# Patient Record
Sex: Male | Born: 1945 | Race: White | Hispanic: No | Smoking: Former smoker
Health system: Southern US, Community
[De-identification: ages and names within clinical notes are randomized; demographics above are authoritative.]

## PROBLEM LIST (undated history)

## (undated) DIAGNOSIS — G8929 Other chronic pain: Secondary | ICD-10-CM

## (undated) DIAGNOSIS — G959 Disease of spinal cord, unspecified: Secondary | ICD-10-CM

## (undated) DIAGNOSIS — Z87898 Personal history of other specified conditions: Secondary | ICD-10-CM

## (undated) DIAGNOSIS — Z87442 Personal history of urinary calculi: Secondary | ICD-10-CM

## (undated) DIAGNOSIS — N138 Other obstructive and reflux uropathy: Secondary | ICD-10-CM

## (undated) DIAGNOSIS — M21372 Foot drop, left foot: Secondary | ICD-10-CM

## (undated) DIAGNOSIS — K5901 Slow transit constipation: Secondary | ICD-10-CM

## (undated) DIAGNOSIS — M21371 Foot drop, right foot: Secondary | ICD-10-CM

## (undated) DIAGNOSIS — M4316 Spondylolisthesis, lumbar region: Secondary | ICD-10-CM

## (undated) DIAGNOSIS — E559 Vitamin D deficiency, unspecified: Secondary | ICD-10-CM

## (undated) DIAGNOSIS — M7020 Olecranon bursitis, unspecified elbow: Secondary | ICD-10-CM

## (undated) DIAGNOSIS — H8110 Benign paroxysmal vertigo, unspecified ear: Secondary | ICD-10-CM

## (undated) DIAGNOSIS — Z8739 Personal history of other diseases of the musculoskeletal system and connective tissue: Secondary | ICD-10-CM

## (undated) DIAGNOSIS — G5600 Carpal tunnel syndrome, unspecified upper limb: Secondary | ICD-10-CM

## (undated) DIAGNOSIS — M47819 Spondylosis without myelopathy or radiculopathy, site unspecified: Secondary | ICD-10-CM

## (undated) DIAGNOSIS — Z859 Personal history of malignant neoplasm, unspecified: Secondary | ICD-10-CM

## (undated) DIAGNOSIS — R262 Difficulty in walking, not elsewhere classified: Secondary | ICD-10-CM

## (undated) DIAGNOSIS — M545 Other chronic pain: Secondary | ICD-10-CM

## (undated) DIAGNOSIS — E119 Type 2 diabetes mellitus without complications: Secondary | ICD-10-CM

## (undated) DIAGNOSIS — J309 Allergic rhinitis, unspecified: Secondary | ICD-10-CM

## (undated) DIAGNOSIS — R29898 Other symptoms and signs involving the musculoskeletal system: Secondary | ICD-10-CM

## (undated) DIAGNOSIS — Z993 Dependence on wheelchair: Secondary | ICD-10-CM

## (undated) DIAGNOSIS — K219 Gastro-esophageal reflux disease without esophagitis: Secondary | ICD-10-CM

## (undated) DIAGNOSIS — N200 Calculus of kidney: Secondary | ICD-10-CM

## (undated) DIAGNOSIS — Z9181 History of falling: Secondary | ICD-10-CM

## (undated) DIAGNOSIS — N401 Enlarged prostate with lower urinary tract symptoms: Secondary | ICD-10-CM

## (undated) DIAGNOSIS — I10 Essential (primary) hypertension: Secondary | ICD-10-CM

## (undated) DIAGNOSIS — Z8619 Personal history of other infectious and parasitic diseases: Secondary | ICD-10-CM

## (undated) DIAGNOSIS — Z9889 Other specified postprocedural states: Secondary | ICD-10-CM

## (undated) DIAGNOSIS — G1221 Amyotrophic lateral sclerosis: Secondary | ICD-10-CM

## (undated) HISTORY — DX: Other chronic pain: G89.29

## (undated) HISTORY — PX: COLONOSCOPY: SHX174

## (undated) HISTORY — DX: Low back pain, unspecified: M54.50

## (undated) HISTORY — DX: Type 2 diabetes mellitus without complications: E11.9

## (undated) HISTORY — DX: Benign paroxysmal vertigo, unspecified ear: H81.10

## (undated) HISTORY — DX: Low back pain: M54.5

## (undated) HISTORY — DX: Disease of spinal cord, unspecified: G95.9

## (undated) HISTORY — DX: Amyotrophic lateral sclerosis: G12.21

## (undated) HISTORY — DX: Vitamin D deficiency, unspecified: E55.9

## (undated) HISTORY — DX: Slow transit constipation: K59.01

## (undated) HISTORY — DX: Allergic rhinitis, unspecified: J30.9

## (undated) HISTORY — DX: Benign prostatic hyperplasia with lower urinary tract symptoms: N40.1

## (undated) HISTORY — DX: Essential (primary) hypertension: I10

## (undated) HISTORY — DX: Olecranon bursitis, unspecified elbow: M70.20

## (undated) HISTORY — DX: Benign prostatic hyperplasia with lower urinary tract symptoms: N13.8

## (undated) HISTORY — DX: Gastro-esophageal reflux disease without esophagitis: K21.9

---
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
## 1991-11-15 HISTORY — PX: INGUINAL HERNIA REPAIR: SUR1180

## 2010-04-28 ENCOUNTER — Encounter: Admission: RE | Admit: 2010-04-28 | Discharge: 2010-04-28 | Payer: Self-pay | Admitting: Orthopedic Surgery

## 2012-01-17 ENCOUNTER — Emergency Department (HOSPITAL_BASED_OUTPATIENT_CLINIC_OR_DEPARTMENT_OTHER)
Admission: EM | Admit: 2012-01-17 | Discharge: 2012-01-18 | Disposition: A | Discharge status: Home Health | Payer: Medicare Other | Attending: Emergency Medicine | Admitting: Emergency Medicine

## 2012-01-17 ENCOUNTER — Emergency Department (INDEPENDENT_AMBULATORY_CARE_PROVIDER_SITE_OTHER): Payer: Medicare Other

## 2012-01-17 ENCOUNTER — Encounter (HOSPITAL_BASED_OUTPATIENT_CLINIC_OR_DEPARTMENT_OTHER): Payer: Self-pay | Admitting: *Deleted

## 2012-01-17 DIAGNOSIS — M546 Pain in thoracic spine: Secondary | ICD-10-CM | POA: Diagnosis not present

## 2012-01-17 DIAGNOSIS — R079 Chest pain, unspecified: Secondary | ICD-10-CM

## 2012-01-17 DIAGNOSIS — F17200 Nicotine dependence, unspecified, uncomplicated: Secondary | ICD-10-CM | POA: Insufficient documentation

## 2012-01-17 DIAGNOSIS — M549 Dorsalgia, unspecified: Secondary | ICD-10-CM | POA: Diagnosis not present

## 2012-01-17 DIAGNOSIS — W19XXXA Unspecified fall, initial encounter: Secondary | ICD-10-CM

## 2012-01-17 DIAGNOSIS — S20229A Contusion of unspecified back wall of thorax, initial encounter: Secondary | ICD-10-CM | POA: Insufficient documentation

## 2012-01-17 DIAGNOSIS — IMO0002 Reserved for concepts with insufficient information to code with codable children: Secondary | ICD-10-CM | POA: Diagnosis not present

## 2012-01-17 DIAGNOSIS — T07XXXA Unspecified multiple injuries, initial encounter: Secondary | ICD-10-CM

## 2012-01-17 DIAGNOSIS — W1789XA Other fall from one level to another, initial encounter: Secondary | ICD-10-CM | POA: Insufficient documentation

## 2012-01-17 MED ORDER — TETANUS-DIPHTH-ACELL PERTUSSIS 5-2.5-18.5 LF-MCG/0.5 IM SUSP
0.5000 mL | Freq: Once | INTRAMUSCULAR | Status: AC
  Administered 2012-01-17: 0.5 mL via INTRAMUSCULAR
  Filled 2012-01-17: qty 0.5

## 2012-01-17 NOTE — ED Notes (Signed)
Pt c/o fall from 2 ft landing on right hip and lower back back

## 2012-01-17 NOTE — ED Provider Notes (Signed)
History     CSN: [id number]  Arrival date & time 01/17/12  2157   First MD Initiated Contact with Patient 01/17/12 2255      Chief Complaint  Patient presents with  . Fall  . Back Pain    (Consider location/radiation/quality/duration/timing/severity/associated sxs/prior treatment) HPI Comments: 66 year old male with a history of kidney stones presents with the complaint of right back pain that occurred several hours prior to arrival. He states that he was unloading a trailer that was approximately 3-1/2 feet off the ground when he accidentally stepped off the side with one of his feet causing him to fall sideways and landed on his right lower back. This was acute in onset, the pain is persistent but almost completely resolves when he holds still sitting still or lying down. It is worse when he rotates to the side or bends forward. He does note that he had a sharp pain with a pop when he was turned to the side and cough prior to arrival. This pain does not radiate and is not associated with numbness weakness ataxia perineal numbness, urinary incontinence or urinary retention. He has no history of chronic back pain. He has had no medications prior to arrival. He denies headache, head injury, neck pain. He does have an associated abrasion to his right lower extremity and right elbow. His tetanus status is within 8 years  Patient is a 66 y.o. male presenting with fall and back pain. The history is provided by the patient and a relative.  Fall Pertinent negatives include no numbness, no nausea, no vomiting and no headaches.  Back Pain  Pertinent negatives include no numbness, no headaches and no weakness.    Past Medical History  Diagnosis Date  . Kidney calculi     Past Surgical History  Procedure Date  . Lithotripsy   . Hernia repair   . Tonsillectomy     History reviewed. No pertinent family history.  History  Substance Use Topics  . Smoking status: Current Everyday Smoker --  0.5 packs/day  . Smokeless tobacco: Not on file  . Alcohol Use: No      Review of Systems  Gastrointestinal: Negative for nausea and vomiting.  Musculoskeletal: Positive for back pain. Negative for joint swelling and gait problem.  Neurological: Negative for dizziness, weakness, numbness and headaches.    Allergies  Review of patient's allergies indicates no known allergies.  Home Medications   Current Outpatient Rx  Name Route Sig Dispense Refill  . CINNAMON PO Oral Take 1 tablet by mouth daily.    Marland Kitchen MAGNESIUM CITRATE PO Oral Take 1 tablet by mouth daily.    . ADULT MULTIVITAMIN W/MINERALS CH Oral Take 1 tablet by mouth daily.    Marland Kitchen OVER THE COUNTER MEDICATION Oral Take 1 tablet by mouth daily. Vitamin K2    . OVER THE COUNTER MEDICATION Oral Take 1 tablet by mouth 2 (two) times daily. Arthritis Eeze    . OVER THE COUNTER MEDICATION Oral Take 1 tablet by mouth 2 (two) times daily. Joint advantage    . OVER THE COUNTER MEDICATION  1 capful by mouth once a day OPC antioxidant    . OVER THE COUNTER MEDICATION Oral Take 3 drops by mouth every morning. Vital 3 supplement    . VITAMIN B-6 PO Oral Take 1 tablet by mouth daily.    Marland Kitchen VITAMIN E PO Oral Take 1 tablet by mouth daily.    Marland Kitchen NAPROXEN 500 MG PO TABS Oral Take 1  tablet (500 mg total) by mouth 2 (two) times daily with a meal. 30 tablet 0    BP 137/67  Pulse 74  Temp(Src) 97.7 F (36.5 C) (Oral)  Ht 5\' 8"  (1.727 m)  Wt 165 lb (74.844 kg)  BMI 25.09 kg/m2  SpO2 100%  Physical Exam  Nursing note and vitals reviewed. Constitutional: He appears well-developed and well-nourished. No distress.  HENT:  Head: Normocephalic and atraumatic.  Mouth/Throat: Oropharynx is clear and moist. No oropharyngeal exudate.  Eyes: Conjunctivae and EOM are normal. Pupils are equal, round, and reactive to light. Right eye exhibits no discharge. Left eye exhibits no discharge. No scleral icterus.  Neck: Normal range of motion. Neck supple. No  JVD present. No thyromegaly present.  Cardiovascular: Normal rate, regular rhythm, normal heart sounds and intact distal pulses.  Exam reveals no gallop and no friction rub.   No murmur heard. Pulmonary/Chest: Effort normal and breath sounds normal. No respiratory distress. He has no wheezes. He has no rales. He exhibits tenderness ( Tender to palpation over the right lower ribs posterolaterally.).  Abdominal: Soft. Bowel sounds are normal. He exhibits no distension and no mass. There is no tenderness.  Musculoskeletal: Normal range of motion. He exhibits tenderness ( Mild tenderness over abrasion to the right lower extremity anterior right lower extremity, right elbow. The joints are very supple and has no decreased range of motion or tenderness.). He exhibits no edema.       No tenderness over the cervical thoracic lumbar or sacral spines, no paraspinal tenderness  Lymphadenopathy:    He has no cervical adenopathy.  Neurological: He is alert. Coordination normal.       Strength sensation and reflexes normal to the bilateral lower extremities, gait normal  Skin: Skin is warm and dry. No rash noted. No erythema.       Abrasions as noted  Psychiatric: He has a normal mood and affect. His behavior is normal.    ED Course  Procedures (including critical care time)  Labs Reviewed - No data to display Dg Ribs Unilateral W/chest Right  01/17/2012  *RADIOLOGY REPORT*  Clinical Data: Back and right lower rib pain post fall  RIGHT RIBS AND CHEST - 3+ VIEW  Comparison: None  Findings: Upper-normal size of cardiac silhouette. Tortuous aorta. Mediastinal contours and pulmonary vascularity otherwise normal. Minimal peribronchial thickening. No pulmonary infiltrate, pleural effusion or pneumothorax. Bones appear demineralized. BB placed at site of symptoms posterior lower right chest. No rib fracture or bone destruction seen.  IMPRESSION: Minimal bronchitic changes. No acute right rib abnormalities.  Original  Report Authenticated By: Lollie Marrow, M.D.     1. Contusion of back   2. Abrasions of multiple sites       MDM  Due to the cough and the reproducible tenderness over the right lateral posterior ribs will rule out rib fracture due to the fall in this area. There is no spinal tenderness, no neurologic deficits and no red flags for back pain. Vital signs are normal, neurologic exam is reassuring, the medication has been declined by the patient. Imaging pending  Patient is reexamined, there is no worsening of his condition, I've explained to him the x-ray results and have encouraged him to use ice packs, anti-inflammatories and followup with his family Dr. He has expressed his understanding     Vida Roller, MD 01/18/12 [phone]

## 2012-01-18 MED ORDER — NAPROXEN 500 MG PO TABS: 500.0000 mg | ORAL_TABLET | Freq: Two times a day (BID) | ORAL | Status: DC

## 2012-01-18 NOTE — Discharge Instructions (Signed)
Your back pain should be treated with medicines such as ibuprofen or aleve and this back pain should get better over the next 2 weeks.  However if you develop severe or worsening pain, low back pain with fever, numbness, weakness or inability to walk or urinate, you should return to the ER immediately.  Please follow up with your doctor this week for a recheck if still having symptoms.  

## 2012-02-15 DIAGNOSIS — R319 Hematuria, unspecified: Secondary | ICD-10-CM | POA: Diagnosis not present

## 2012-02-15 DIAGNOSIS — Z87442 Personal history of urinary calculi: Secondary | ICD-10-CM | POA: Diagnosis not present

## 2012-02-20 DIAGNOSIS — N200 Calculus of kidney: Secondary | ICD-10-CM | POA: Diagnosis not present

## 2012-03-06 DIAGNOSIS — R310 Gross hematuria: Secondary | ICD-10-CM | POA: Diagnosis not present

## 2012-03-06 DIAGNOSIS — R109 Unspecified abdominal pain: Secondary | ICD-10-CM | POA: Diagnosis not present

## 2012-03-06 DIAGNOSIS — N200 Calculus of kidney: Secondary | ICD-10-CM | POA: Diagnosis not present

## 2012-03-30 ENCOUNTER — Ambulatory Visit (INDEPENDENT_AMBULATORY_CARE_PROVIDER_SITE_OTHER): Payer: Medicare Other | Admitting: Family Medicine

## 2012-03-30 ENCOUNTER — Encounter: Payer: Self-pay | Admitting: Family Medicine

## 2012-03-30 VITALS — BP 119/80 | HR 67 | Temp 98.2°F | Ht 68.0 in | Wt 168.0 lb

## 2012-03-30 DIAGNOSIS — H8110 Benign paroxysmal vertigo, unspecified ear: Secondary | ICD-10-CM

## 2012-03-30 DIAGNOSIS — R5383 Other fatigue: Secondary | ICD-10-CM | POA: Diagnosis not present

## 2012-03-30 DIAGNOSIS — N200 Calculus of kidney: Secondary | ICD-10-CM | POA: Diagnosis not present

## 2012-03-30 DIAGNOSIS — R3915 Urgency of urination: Secondary | ICD-10-CM | POA: Diagnosis not present

## 2012-03-30 DIAGNOSIS — R319 Hematuria, unspecified: Secondary | ICD-10-CM

## 2012-03-30 DIAGNOSIS — R5381 Other malaise: Secondary | ICD-10-CM | POA: Diagnosis not present

## 2012-03-30 DIAGNOSIS — R030 Elevated blood-pressure reading, without diagnosis of hypertension: Secondary | ICD-10-CM

## 2012-03-30 DIAGNOSIS — H6120 Impacted cerumen, unspecified ear: Secondary | ICD-10-CM | POA: Diagnosis not present

## 2012-03-30 DIAGNOSIS — R6889 Other general symptoms and signs: Secondary | ICD-10-CM | POA: Diagnosis not present

## 2012-03-30 DIAGNOSIS — R42 Dizziness and giddiness: Secondary | ICD-10-CM

## 2012-03-30 HISTORY — DX: Benign paroxysmal vertigo, unspecified ear: H81.10

## 2012-03-30 LAB — COMPREHENSIVE METABOLIC PANEL
AST: 20 U/L (ref 0–37)
Albumin: 4.7 g/dL (ref 3.5–5.2)
Alkaline Phosphatase: 63 U/L (ref 39–117)
BUN: 16 mg/dL (ref 6–23)
CO2: 30 mEq/L (ref 19–32)
Calcium: 9.6 mg/dL (ref 8.4–10.5)
Chloride: 102 mEq/L (ref 96–112)
Creat: 0.77 mg/dL (ref 0.50–1.35)
Glucose, Bld: 85 mg/dL (ref 70–99)
Potassium: 4.2 mEq/L (ref 3.5–5.3)
Sodium: 140 mEq/L (ref 135–145)
Total Protein: 7 g/dL (ref 6.0–8.3)

## 2012-03-30 LAB — POCT URINALYSIS DIPSTICK
Bilirubin, UA: NEGATIVE
Ketones, UA: NEGATIVE
Nitrite, UA: NEGATIVE
Protein, UA: NEGATIVE
Urobilinogen, UA: 0.2
pH, UA: 7

## 2012-03-30 LAB — CBC WITH DIFFERENTIAL/PLATELET
Basophils Relative: 0 % (ref 0–1)
Eosinophils Absolute: 0.3 10*3/uL (ref 0.0–0.7)
MCHC: 34.1 g/dL (ref 30.0–36.0)
Neutrophils Relative %: 65 % (ref 43–77)

## 2012-03-30 LAB — TSH: TSH: 1.306 u[IU]/mL (ref 0.350–4.500)

## 2012-03-30 NOTE — Assessment & Plan Note (Signed)
Removed today with curette by me.

## 2012-03-30 NOTE — Progress Notes (Signed)
Office Note 03/30/2012  CC:  Chief Complaint  Patient presents with  . Establish Care    BP issues, dizziness    HPI:  Ethan Rose is a 66 y.o. White male who is here to establish care and discuss BP. Patient's most recent primary MD: [facility] in [place]. Old records were not reviewed prior to or during today's visit.  Last couple of weeks has had a few episodes of brief "off balance" or "swimmy headed" feeling.  Brief periods of rest brought alleviation of sx's.  No nausea with episodes. He took his bp at a [employer] (standing up) and got 177/77.  The episodes are more common at the end of his work day.   Denies any past history of high bp readings.  Has had days in between without any symptoms at all. No diff work environment or stress level.  No home stress out of the ordinary.  No recent med change to explain things.  No CP or pressure, no SOB.   +Mild LBP--"nagging-type" on and off lately. Has had kidney stones: passing some 2 wks ago, took vicodin a bit then but none since. PO intake okay.    Past Medical History  Diagnosis Date  . Kidney calculi     Multiple passed in the past.  Currently has one too large to pass (as of 03/29/12) and will get lithotripsy again soon.  Marland Kitchen GERD (gastroesophageal reflux disease)   . Hay fever     Past Surgical History  Procedure Date  . Lithotripsy     Multiple  . Hernia repair 1993    Bilateral inguinal (urologist did these procedures)  . Tonsillectomy 1954  . Colonoscopy approx 2011    Dr. Kinnie Scales (normal per pt report)    Family History  Problem Relation Age of Onset  . Stroke Mother   . Stroke Father     History   Social History  . Marital Status: Married    Spouse Name: N/A    Number of Children: N/A  . Years of Education: N/A   Occupational History  . Not on file.   Social History Main Topics  . Smoking status: Current Everyday Smoker -- 0.5 packs/day  . Smokeless tobacco: Never Used  . Alcohol Use: Yes  . Drug  Use: No  . Sexually Active: No   Other Topics Concern  . 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  Has lived in [place] x 25 yrs.Tobacco 50 pack-yr hx.  Alcohol: 2 beers per day avg.  No drugs.Exercise: none.  Has active job/physical labor.    Outpatient Encounter Prescriptions as of 03/30/2012  Medication Sig Dispense Refill  . CINNAMON PO Take 1 tablet by mouth daily.      Marland Kitchen MAGNESIUM CITRATE PO Take 1 tablet by mouth daily.      . Multiple Vitamin (MULITIVITAMIN WITH MINERALS) TABS Take 1 tablet by mouth daily.      Marland Kitchen OVER THE COUNTER MEDICATION Take 1 tablet by mouth daily. Vitamin K2      . OVER THE COUNTER MEDICATION Take 1 tablet by mouth 2 (two) times daily. Arthritis Eeze      . OVER THE COUNTER MEDICATION Take 1 tablet by mouth 2 (two) times daily. Joint advantage      . OVER THE COUNTER MEDICATION 1 capful by mouth once a day OPC antioxidant      .  OVER THE COUNTER MEDICATION Take 3 drops by mouth every morning. Vital 3 supplement      . Pyridoxine HCl (VITAMIN B-6 PO) Take 1 tablet by mouth daily.      . Tamsulosin HCl (FLOMAX) 0.4 MG CAPS Take 1 tablet by mouth Daily.      Marland Kitchen VITAMIN E PO Take 1 tablet by mouth daily.      Marland Kitchen DISCONTD: naproxen (NAPROSYN) 500 MG tablet Take 1 tablet (500 mg total) by mouth 2 (two) times daily with a meal.  30 tablet  0  He is not taking the naproxen listed above.  No Known Allergies  ROS: as per HPI, plus: Review of Systems  Constitutional: Positive for fatigue (mild). Negative for fever and unexpected weight change.  HENT: Negative for congestion and sore throat.   Eyes: Negative for visual disturbance.  Respiratory: Negative for cough.   Cardiovascular: Negative for chest pain.  Gastrointestinal: Negative for nausea and abdominal pain.  Genitourinary: Negative for dysuria.       Mild polyuria since more frequent kidney stones lately, +  some urgency with this.  Musculoskeletal: Negative for back pain and joint swelling.  Skin: Negative for rash.  Neurological: Negative for weakness and headaches.  Hematological: Negative for adenopathy.    PE; Blood pressure 119/80, pulse 67, temperature 98.2 F (36.8 C), temperature source Temporal, height 5\' 8"  (1.727 m), weight 168 lb (76.204 kg), SpO2 95.00%. Gen: Alert, well appearing.  Patient is oriented to person, place, time, and situation. ENT: Ears: EAC clear on right, large wax ball on left occluding the canal 80%,, normal epithelium.  TMs with good light reflex and landmarks bilaterally.  Eyes: no injection, icteris, swelling, or exudate.  EOMI, PERRLA. Nose: no drainage or turbinate edema/swelling.  No injection or focal lesion.  Mouth: lips without lesion/swelling.  Oral mucosa pink and moist.  Dentition intact and without obvious caries or gingival swelling.  Oropharynx without erythema, exudate, or swelling.  Neck: supple/nontender.  No LAD, mass, or TM.  Carotid pulses 2+ bilaterally, without bruits. CV: RRR, no m/r/g.   LUNGS: CTA bilat, nonlabored resps, good aeration in all lung fields. ABD: soft, NT, ND, BS normal.  No hepatospenomegaly or mass.  No bruits. EXT: no clubbing, cyanosis, or edema.   Pertinent labs:  12 lead EKG today: NSR, poor R wave progression, otherwise normal--NO OLD EKG AVAILABLE FOR COMPARISON.  CC UA today: + blood, trace LEU, o/w normal  ASSESSMENT AND PLAN:   New pt: obtain old records.  Elevated blood pressure reading without diagnosis of hypertension Recommended home bp monitoring 1-2 per day over the next few weeks to get more info. Will also check CMET and CBC and TSH today. Minimize or avoid NSAIDs.  Cerumen impaction Removed today with curette by me.  Dizziness No clear etiology but certainly could be related to recent labile bp. His urinalysis abnormalities today likely reflect his stone disease but I did send it for culture  to r/o infection as a cause for his vague dizziness spells lately. I don't think cardiac ischemia is the cause of his recent dizzy spells, but with his mildly abnormal EKG today (poor R wave progression), I will recommend that he see a cardiologist for further risk stratification for CAD.   Unable to check lipids today since he is not fasting. Encouraged smoking cessation but will hit on this further at next f/u appt.     Return in about 1 month (around 04/30/2012) for f/u bp and  dizzy spells.Marland Kitchen

## 2012-03-30 NOTE — Assessment & Plan Note (Signed)
Recommended home bp monitoring 1-2 per day over the next few weeks to get more info. Will also check CMET and CBC and TSH today. Minimize or avoid NSAIDs.

## 2012-03-30 NOTE — Assessment & Plan Note (Signed)
No clear etiology but certainly could be related to recent labile bp. His urinalysis abnormalities today likely reflect his stone disease but I did send it for culture to r/o infection as a cause for his vague dizziness spells lately. I don't think cardiac ischemia is the cause of his recent dizzy spells, but with his mildly abnormal EKG today (poor R wave progression), I will recommend that he see a cardiologist for further risk stratification for CAD.   Unable to check lipids today since he is not fasting. Encouraged smoking cessation but will hit on this further at next f/u appt.

## 2012-04-01 LAB — URINE CULTURE
Colony Count: NO GROWTH
Organism ID, Bacteria: NO GROWTH

## 2012-04-02 ENCOUNTER — Encounter: Payer: Self-pay | Admitting: Family Medicine

## 2012-05-09 ENCOUNTER — Encounter: Payer: Self-pay | Admitting: Family Medicine

## 2012-05-09 ENCOUNTER — Ambulatory Visit (INDEPENDENT_AMBULATORY_CARE_PROVIDER_SITE_OTHER): Payer: Medicare Other | Admitting: Family Medicine

## 2012-05-09 VITALS — BP 121/73 | HR 69 | Ht 68.0 in | Wt 169.0 lb

## 2012-05-09 DIAGNOSIS — E785 Hyperlipidemia, unspecified: Secondary | ICD-10-CM

## 2012-05-09 DIAGNOSIS — R42 Dizziness and giddiness: Secondary | ICD-10-CM

## 2012-05-09 DIAGNOSIS — R030 Elevated blood-pressure reading, without diagnosis of hypertension: Secondary | ICD-10-CM

## 2012-05-09 DIAGNOSIS — R9431 Abnormal electrocardiogram [ECG] [EKG]: Secondary | ICD-10-CM

## 2012-05-09 DIAGNOSIS — F17200 Nicotine dependence, unspecified, uncomplicated: Secondary | ICD-10-CM | POA: Diagnosis not present

## 2012-05-09 NOTE — Assessment & Plan Note (Signed)
Discussed risks of this, encouraged pt to pick a quit date--he prefers to avoid medication aid. 5 minutes spent discussing this today.

## 2012-05-09 NOTE — Assessment & Plan Note (Addendum)
F/u bp's, although only a few, have been normal.  He has had no further episodes of "lightheadedness". Continue to monitor --advised to get new bp cuff--at home or a pharmacy. Call or return if persistently >140/90.  Regarding CV risk stratification, I think his age, hx of tob dependence, his hx of elevatied bp's, and his nonspecific abnl EKG warrant further eval with cardiologist for possible stress testing.  Order for referral placed today.   Also placed order for future fasting lipid panel to be done through our office.

## 2012-05-09 NOTE — Progress Notes (Signed)
OFFICE NOTE  05/09/2012  CC:  Chief Complaint  Patient presents with  . Follow-up    blood pressure     HPI: Patient is a 66 y.o. Caucasian male who is here for 5 wk f/u for elevated bp and dizziness spells. Reports feeling well.  Just got back from [place]. No dizziness spells since last visit.  Home bp checks cited by pt as being "all over the place" but he says the cuff was acting weird and he doesn't trust the accuracy of it.  Two checks at [facility] were normal per his report. Long time smoker, says he wants to avoid medication to help him quit.  At this point he is contemplating quitting but "not right now".  He wants to quit "cold [place]" like he has in the past.  ROS: no chest pain, no SOB, no dizziness, no palpitations, no LE edema, no PND or orthopnea.  Pertinent PMH:  Past Medical History  Diagnosis Date  . Kidney calculi     Multiple passed in the past.  Currently has one too large to pass (as of 03/29/12) and will get lithotripsy again soon.  Marland Kitchen GERD (gastroesophageal reflux disease)   . Hay fever   . Tobacco dependence     MEDS:  Outpatient Prescriptions Prior to Visit  Medication Sig Dispense Refill  . CINNAMON PO Take 1 tablet by mouth daily.      Marland Kitchen MAGNESIUM CITRATE PO Take 1 tablet by mouth daily.      . Multiple Vitamin (MULITIVITAMIN WITH MINERALS) TABS Take 1 tablet by mouth daily.      Marland Kitchen OVER THE COUNTER MEDICATION Take 1 tablet by mouth daily. Vitamin K2      . OVER THE COUNTER MEDICATION Take 1 tablet by mouth 2 (two) times daily. Arthritis Eeze      . OVER THE COUNTER MEDICATION Take 1 tablet by mouth 2 (two) times daily. Joint advantage      . OVER THE COUNTER MEDICATION 1 capful by mouth once a day OPC antioxidant      . OVER THE COUNTER MEDICATION Take 3 drops by mouth every morning. Vital 3 supplement      . Pyridoxine HCl (VITAMIN B-6 PO) Take 1 tablet by mouth daily.      . Tamsulosin HCl (FLOMAX) 0.4 MG CAPS Take 1 tablet by mouth Daily.      Marland Kitchen  VITAMIN E PO Take 1 tablet by mouth daily.        PE: Blood pressure 121/73, pulse 69, height 5\' 8"  (1.727 m), weight 169 lb (76.658 kg). Gen: Alert, well appearing.  Patient is oriented to person, place, time, and situation. CV: RRR, no m/r/g.   LUNGS: CTA bilat, nonlabored resps, good aeration in all lung fields. EXT: no clubbing, cyanosis, or edema.    IMPRESSION AND PLAN:  Elevated blood pressure reading without diagnosis of hypertension F/u bp's, although only a few, have been normal.  He has had no further episodes of "lightheadedness". Continue to monitor --advised to get new bp cuff--at home or a pharmacy. Call or return if persistently >140/90.  Regarding CV risk stratification, I think his age, hx of tob dependence, his hx of elevatied bp's, and his nonspecific abnl EKG warrant further eval with cardiologist for possible stress testing.  Order for referral placed today.   Also placed order for future fasting lipid panel to be done through our office.  Tobacco dependence Discussed risks of this, encouraged pt to pick a quit  date--he prefers to avoid medication aid. 5 minutes spent discussing this today.  Dizziness Unclear etiology.  Resolved.     FOLLOW UP: 6 mo

## 2012-05-09 NOTE — Assessment & Plan Note (Signed)
Unclear etiology.  Resolved.

## 2012-08-27 ENCOUNTER — Telehealth: Payer: Self-pay | Admitting: Family Medicine

## 2012-08-27 NOTE — Telephone Encounter (Signed)
Patient is requesting a Rx for shingles vaccine to be sent [facility] [address] [place]. Please call if any problems.

## 2012-08-28 MED ORDER — VARICELLA VIRUS VACCINE LIVE 1350 PFU/0.5ML IJ SUSR: 0.5000 mL | Freq: Once | INTRAMUSCULAR | Status: DC

## 2012-08-28 NOTE — Telephone Encounter (Signed)
RX sent to pharmacy  

## 2012-10-25 DIAGNOSIS — R319 Hematuria, unspecified: Secondary | ICD-10-CM | POA: Diagnosis not present

## 2012-10-25 DIAGNOSIS — N200 Calculus of kidney: Secondary | ICD-10-CM | POA: Diagnosis not present

## 2012-10-25 DIAGNOSIS — R109 Unspecified abdominal pain: Secondary | ICD-10-CM | POA: Diagnosis not present

## 2012-10-29 ENCOUNTER — Encounter: Payer: Self-pay | Admitting: Family Medicine

## 2012-10-29 ENCOUNTER — Ambulatory Visit (INDEPENDENT_AMBULATORY_CARE_PROVIDER_SITE_OTHER): Payer: Medicare Other | Admitting: Family Medicine

## 2012-10-29 ENCOUNTER — Telehealth: Payer: Self-pay | Admitting: Family Medicine

## 2012-10-29 VITALS — BP 127/77 | HR 78 | Ht 68.0 in | Wt 162.0 lb

## 2012-10-29 DIAGNOSIS — F17200 Nicotine dependence, unspecified, uncomplicated: Secondary | ICD-10-CM

## 2012-10-29 DIAGNOSIS — Z23 Encounter for immunization: Secondary | ICD-10-CM | POA: Diagnosis not present

## 2012-10-29 DIAGNOSIS — R030 Elevated blood-pressure reading, without diagnosis of hypertension: Secondary | ICD-10-CM | POA: Diagnosis not present

## 2012-10-29 NOTE — Assessment & Plan Note (Signed)
Encouraged cessation but he is not likely to do that anytime soon.

## 2012-10-29 NOTE — Telephone Encounter (Signed)
Patient is no longer taking Magnesium Citrate, Joint Advantage OTC, & Vital 3 Supplement. Please remove from medication list.

## 2012-10-29 NOTE — Telephone Encounter (Signed)
Medication list corrected

## 2012-10-29 NOTE — Progress Notes (Signed)
OFFICE NOTE  10/29/2012  CC:  Chief Complaint  Patient presents with  . Follow-up    tobacco-continues to smoke, HTN     HPI: Patient is a 66 y.o. Caucasian male who is here for 6 mo f/u elevated bp w/out dx HTN and also tobacco dependence.   Last week started having right CVA/flank pain--where he knew he had a large stone.   He then went to his urologist, where they evaluated him and noted his stone was "on the move" and too large to pass, so he is set up for lithotripsy soon.      Wife sick lately with bowel obstruction. He slipped and fell a few weeks ago and hit left side of chest, felt like he cracked a rib--this has been getting a lot better gradually. Still smoking, desires to quit but no plans to right now. He did not get the cardiology eval or lipid panel we had planned on since last visit--had to cancel for some reason and has not rescheduled. No CP, no SOB, no palpitations, no diaphoresis, no dizziness.  Pertinent PMH:  Past Medical History  Diagnosis Date  . Kidney calculi     Multiple passed in the past.  Currently has one too large to pass (as of 03/29/12) and will get lithotripsy again soon.  Marland Kitchen GERD (gastroesophageal reflux disease)   . Hay fever   . Tobacco dependence     MEDS:  Outpatient Prescriptions Prior to Visit  Medication Sig Dispense Refill  . CINNAMON PO Take 1 tablet by mouth daily.      Marland Kitchen MAGNESIUM CITRATE PO Take 1 tablet by mouth daily.      . Multiple Vitamin (MULITIVITAMIN WITH MINERALS) TABS Take 1 tablet by mouth daily.      Marland Kitchen OVER THE COUNTER MEDICATION Take 1 tablet by mouth daily. Vitamin K2      . OVER THE COUNTER MEDICATION Take 1 tablet by mouth 2 (two) times daily. Arthritis Eeze      . OVER THE COUNTER MEDICATION Take 1 tablet by mouth 2 (two) times daily. Joint advantage      . OVER THE COUNTER MEDICATION 1 capful by mouth once a day OPC antioxidant      . OVER THE COUNTER MEDICATION Take 3 drops by mouth every morning. Vital 3  supplement      . Pyridoxine HCl (VITAMIN B-6 PO) Take 1 tablet by mouth daily.      . Tamsulosin HCl (FLOMAX) 0.4 MG CAPS Take 1 tablet by mouth Daily.      Marland Kitchen VITAMIN E PO Take 1 tablet by mouth daily.      . [DISCONTINUED] varicella virus vaccine live (VARIVAX) 1350 PFU/0.5ML INJ injection Inject 0.5 mLs into the skin once.  1 each  0  Last reviewed on 10/29/2012  8:19 AM by Jeoffrey Massed, MD  PE: Blood pressure 127/77, pulse 78, height 5\' 8"  (1.727 m), weight 162 lb (73.483 kg). Gen: Alert, well appearing.  Patient is oriented to person, place, time, and situation. CV: RRR, no m/r/g.   LUNGS: CTA bilat, nonlabored resps, good aeration in all lung fields. EXT: no clubbing, cyanosis, or edema.   IMPRESSION AND PLAN:  Elevated blood pressure reading without diagnosis of hypertension Reviewed about 10 bp's since last visit here--all normal. Reassured.  Encouraged healthy lifestyle.  Tobacco dependence Encouraged cessation but he is not likely to do that anytime soon.   Encouraged pt to reschedule cardiology eval for CAD risk stratification.  Encouraged pt to reschedule lab visit here for FLP asap.  FOLLOW UP: 6 mo for CPE

## 2012-10-29 NOTE — Assessment & Plan Note (Signed)
Reviewed about 10 bp's since last visit here--all normal. Reassured.  Encouraged healthy lifestyle.

## 2012-10-30 ENCOUNTER — Other Ambulatory Visit (INDEPENDENT_AMBULATORY_CARE_PROVIDER_SITE_OTHER): Payer: Medicare Other

## 2012-10-30 DIAGNOSIS — R9431 Abnormal electrocardiogram [ECG] [EKG]: Secondary | ICD-10-CM | POA: Diagnosis not present

## 2012-10-30 DIAGNOSIS — F17200 Nicotine dependence, unspecified, uncomplicated: Secondary | ICD-10-CM | POA: Diagnosis not present

## 2012-10-30 DIAGNOSIS — E785 Hyperlipidemia, unspecified: Secondary | ICD-10-CM | POA: Diagnosis not present

## 2012-10-30 DIAGNOSIS — R030 Elevated blood-pressure reading, without diagnosis of hypertension: Secondary | ICD-10-CM

## 2012-10-30 LAB — LIPID PANEL
Cholesterol: 136 mg/dL (ref 0–200)
HDL: 32.1 mg/dL — ABNORMAL LOW (ref >39.00)
LDL Cholesterol: 88 mg/dL (ref 0–99)
Triglycerides: 78 mg/dL (ref 0.0–149.0)

## 2012-11-02 DIAGNOSIS — N200 Calculus of kidney: Secondary | ICD-10-CM | POA: Diagnosis not present

## 2012-11-12 DIAGNOSIS — N200 Calculus of kidney: Secondary | ICD-10-CM | POA: Diagnosis not present

## 2013-02-18 DIAGNOSIS — Z87442 Personal history of urinary calculi: Secondary | ICD-10-CM | POA: Diagnosis not present

## 2013-02-18 DIAGNOSIS — R319 Hematuria, unspecified: Secondary | ICD-10-CM | POA: Diagnosis not present

## 2013-02-18 DIAGNOSIS — N200 Calculus of kidney: Secondary | ICD-10-CM | POA: Diagnosis not present

## 2013-04-29 ENCOUNTER — Encounter: Payer: Self-pay | Admitting: Family Medicine

## 2013-04-29 ENCOUNTER — Ambulatory Visit (INDEPENDENT_AMBULATORY_CARE_PROVIDER_SITE_OTHER): Payer: Medicare Other | Admitting: Family Medicine

## 2013-04-29 VITALS — BP 136/86 | HR 73 | Temp 98.2°F | Resp 18 | Ht 68.0 in | Wt 169.0 lb

## 2013-04-29 DIAGNOSIS — Z0000 Encounter for general adult medical examination without abnormal findings: Secondary | ICD-10-CM | POA: Insufficient documentation

## 2013-04-29 DIAGNOSIS — Z0389 Encounter for observation for other suspected diseases and conditions ruled out: Secondary | ICD-10-CM

## 2013-04-29 DIAGNOSIS — Z125 Encounter for screening for malignant neoplasm of prostate: Secondary | ICD-10-CM

## 2013-04-29 DIAGNOSIS — R252 Cramp and spasm: Secondary | ICD-10-CM | POA: Diagnosis not present

## 2013-04-29 DIAGNOSIS — R29898 Other symptoms and signs involving the musculoskeletal system: Secondary | ICD-10-CM | POA: Diagnosis not present

## 2013-04-29 LAB — CBC WITH DIFFERENTIAL/PLATELET
Lymphocytes Relative: 24.5 % (ref 12.0–46.0)
Lymphs Abs: 1.7 10*3/uL (ref 0.7–4.0)
MCHC: 33.4 g/dL (ref 30.0–36.0)
MCV: 95.2 fl (ref 78.0–100.0)
Monocytes Absolute: 0.6 10*3/uL (ref 0.1–1.0)
Platelets: 213 10*3/uL (ref 150.0–400.0)
RBC: 4.55 Mil/uL (ref 4.22–5.81)

## 2013-04-29 LAB — BASIC METABOLIC PANEL
Chloride: 107 mEq/L (ref 96–112)
Glucose, Bld: 100 mg/dL — ABNORMAL HIGH (ref 70–99)
Potassium: 4.5 mEq/L (ref 3.5–5.1)
Sodium: 142 mEq/L (ref 135–145)

## 2013-04-29 NOTE — Patient Instructions (Signed)
Health Maintenance, Males A healthy lifestyle and preventative care can promote health and wellness.  Maintain regular health, dental, and eye exams.  Eat a healthy diet. Foods like vegetables, fruits, whole grains, low-fat dairy products, and lean protein foods contain the nutrients you need without too many calories. Decrease your intake of foods high in solid fats, added sugars, and salt. Get information about a proper diet from your caregiver, if necessary.  Regular physical exercise is one of the most important things you can do for your health. Most adults should get at least 150 minutes of moderate-intensity exercise (any activity that increases your heart rate and causes you to sweat) each week. In addition, most adults need muscle-strengthening exercises on 2 or more days a week.   Maintain a healthy weight. The body mass index (BMI) is a screening tool to identify possible weight problems. It provides an estimate of body fat based on height and weight. Your caregiver can help determine your BMI, and can help you achieve or maintain a healthy weight. For adults 20 years and older:  A BMI below 18.5 is considered underweight.  A BMI of 18.5 to 24.9 is normal.  A BMI of 25 to 29.9 is considered overweight.  A BMI of 30 and above is considered obese.  Maintain normal blood lipids and cholesterol by exercising and minimizing your intake of saturated fat. Eat a balanced diet with plenty of fruits and vegetables. Blood tests for lipids and cholesterol should begin at age 20 and be repeated every 5 years. If your lipid or cholesterol levels are high, you are over 50, or you are a high risk for heart disease, you may need your cholesterol levels checked more frequently.Ongoing high lipid and cholesterol levels should be treated with medicines, if diet and exercise are not effective.  If you smoke, find out from your caregiver how to quit. If you do not use tobacco, do not start.  If you  choose to drink alcohol, do not exceed 2 drinks per day. One drink is considered to be 12 ounces (355 mL) of beer, 5 ounces (148 mL) of wine, or 1.5 ounces (44 mL) of liquor.  Avoid use of street drugs. Do not share needles with anyone. Ask for help if you need support or instructions about stopping the use of drugs.  High blood pressure causes heart disease and increases the risk of stroke. Blood pressure should be checked at least every 1 to 2 years. Ongoing high blood pressure should be treated with medicines if weight loss and exercise are not effective.  If you are 45 to 67 years old, ask your caregiver if you should take aspirin to prevent heart disease.  Diabetes screening involves taking a blood sample to check your fasting blood sugar level. This should be done once every 3 years, after age 45, if you are within normal weight and without risk factors for diabetes. Testing should be considered at a younger age or be carried out more frequently if you are overweight and have at least 1 risk factor for diabetes.  Colorectal cancer can be detected and often prevented. Most routine colorectal cancer screening begins at the age of 50 and continues through age 75. However, your caregiver may recommend screening at an earlier age if you have risk factors for colon cancer. On a yearly basis, your caregiver may provide home test kits to check for hidden blood in the stool. Use of a small camera at the end of a tube,   to directly examine the colon (sigmoidoscopy or colonoscopy), can detect the earliest forms of colorectal cancer. Talk to your caregiver about this at age 50, when routine screening begins. Direct examination of the colon should be repeated every 5 to 10 years through age 75, unless early forms of pre-cancerous polyps or small growths are found.  Hepatitis C blood testing is recommended for all people born from 1945 through 1965 and any individual with known risks for hepatitis C.  Healthy  men should no longer receive prostate-specific antigen (PSA) blood tests as part of routine cancer screening. Consult with your caregiver about prostate cancer screening.  Testicular cancer screening is not recommended for adolescents or adult males who have no symptoms. Screening includes self-exam, caregiver exam, and other screening tests. Consult with your caregiver about any symptoms you have or any concerns you have about testicular cancer.  Practice safe sex. Use condoms and avoid high-risk sexual practices to reduce the spread of sexually transmitted infections (STIs).  Use sunscreen with a sun protection factor (SPF) of 30 or greater. Apply sunscreen liberally and repeatedly throughout the day. You should seek shade when your shadow is shorter than you. Protect yourself by wearing long sleeves, pants, a wide-brimmed hat, and sunglasses year round, whenever you are outdoors.  Notify your caregiver of new moles or changes in moles, especially if there is a change in shape or color. Also notify your caregiver if a mole is larger than the size of a pencil eraser.  A one-time screening for abdominal aortic aneurysm (AAA) and surgical repair of large AAAs by sound wave imaging (ultrasonography) is recommended for ages 65 to 75 years who are current or former smokers.  Stay current with your immunizations. Document Released: 04/28/2008 Document Revised: 01/23/2012 Document Reviewed: 03/28/2011 [employer] Patient Information 2014 [employer].  

## 2013-04-29 NOTE — Progress Notes (Signed)
Office Note 05/23/2013  CC:  Chief Complaint  Patient presents with  . Annual Exam    pt fasting  . Leg Problem    cramping at night and getting weaker    HPI:  Ethan Rose is a 67 y.o. White male who is here for CPE.  He complains of a few months of feeling generalized weakness in his legs--diffuse/symmetric.  This is noted either towards the end of long work days--which is most days for him, or with doing things like heavy lifting and getting up and down off of trucks.  What he used to be able to do w/out problem actually makes his legs feel a bit weak.  Also has long hx of leg cramps, occ during working but most often nocturnal. Again, he tends to work long hours and it has been hot lately and he admits to being relatively dehydrated a lot of the time.  Denies pain in leg muscles except for the times when they are cramping.  No rashes.  Denies claudication sx's.  He reports having another stone with lithotripsy since I saw him last.  Past Medical History  Diagnosis Date  . Kidney calculi     Multiple passed in the past.   . GERD (gastroesophageal reflux disease)   . Hay fever   . Tobacco dependence     Past Surgical History  Procedure Laterality Date  . Lithotripsy      Multiple  . Hernia repair  1993    Bilateral inguinal (urologist did these procedures)  . Tonsillectomy  1954  . Colonoscopy  approx 2011    Dr. Kinnie Scales (normal per pt report)    Family History  Problem Relation Age of Onset  . Stroke Mother   . Stroke Father     History   Social History  . Marital Status: Married    Spouse Name: N/A    Number of Children: N/A  . Years of Education: N/A   Occupational History  . Not on file.   Social History Main Topics  . Smoking status: Current Every Day Smoker -- 0.50 packs/day  . Smokeless tobacco: Never Used  . Alcohol Use: Yes  . Drug Use: No  . Sexually Active: No   Other Topics Concern  . Not on file   Social History Narrative   Married, has one daughter and one grandson.   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   Orig from [place].  Has lived in [place] x 25 yrs.   Tobacco 50 pack-yr hx.  Alcohol: 2 beers per day avg.  No drugs.   Exercise: none.  Has active job/physical labor.    Outpatient Prescriptions Prior to Visit  Medication Sig Dispense Refill  . CINNAMON PO Take 1 tablet by mouth daily.      Marland Kitchen HYDROcodone-acetaminophen (VICODIN) 5-500 MG per tablet as directed. For kidney stone      . Multiple Vitamin (MULITIVITAMIN WITH MINERALS) TABS Take 1 tablet by mouth daily.      Marland Kitchen OVER THE COUNTER MEDICATION Take 1 tablet by mouth daily. Vitamin K2      . OVER THE COUNTER MEDICATION Take 1 tablet by mouth 2 (two) times daily. Arthritis Eeze      . OVER THE COUNTER MEDICATION 1 capful by mouth once a day OPC antioxidant      . VITAMIN E PO Take 1 tablet by mouth daily.      . Pyridoxine HCl (VITAMIN B-6 PO) Take 1 tablet by mouth  daily.      . Tamsulosin HCl (FLOMAX) 0.4 MG CAPS Take 1 tablet by mouth Daily.       No facility-administered medications prior to visit.    No Known Allergies  ROS Review of Systems  Constitutional: Negative for fever, chills, appetite change and fatigue.  HENT: Negative for ear pain, congestion, sore throat, neck stiffness and dental problem.   Eyes: Negative for discharge, redness and visual disturbance.  Respiratory: Negative for cough, chest tightness, shortness of breath and wheezing.   Cardiovascular: Negative for chest pain, palpitations and leg swelling.  Gastrointestinal: Negative for nausea, vomiting, abdominal pain, diarrhea and blood in stool.  Genitourinary: Negative for dysuria, urgency, frequency, hematuria, flank pain and difficulty urinating.  Musculoskeletal: Negative for myalgias, back pain, joint swelling and arthralgias.  Skin: Negative for pallor and rash.  Neurological: Negative for dizziness, speech difficulty, weakness and headaches.  Hematological:  Negative for adenopathy. Does not bruise/bleed easily.  Psychiatric/Behavioral: Negative for confusion and sleep disturbance. The patient is not nervous/anxious.     PE; Blood pressure 136/86, pulse 73, temperature 98.2 F (36.8 C), temperature source Oral, resp. rate 18, height 5\' 8"  (1.727 m), weight 169 lb (76.658 kg), SpO2 96.00%. Gen: Alert, well appearing.  Patient is oriented to person, place, time, and situation. AFFECT: pleasant, lucid thought and speech. ENT: Ears: EACs clear, normal epithelium.  TMs with good light reflex and landmarks bilaterally.  Eyes: no injection, icteris, swelling, or exudate.  EOMI, PERRLA. Nose: no drainage or turbinate edema/swelling.  No injection or focal lesion.  Mouth: lips without lesion/swelling.  Oral mucosa pink and moist.  Dentition intact and without obvious caries or gingival swelling.  Oropharynx without erythema, exudate, or swelling.  Neck: supple/nontender.  No LAD, mass, or TM.  Carotid pulses 2+ bilaterally, without bruits. CV: RRR, no m/r/g.   LUNGS: CTA bilat, nonlabored resps, good aeration in all lung fields. ABD: soft, NT, ND, BS normal.  No hepatospenomegaly or mass.  No bruits. EXT: no clubbing, cyanosis, or edema.  Musculoskeletal: no joint swelling, erythema, warmth, or tenderness.  ROM of all joints intact. Skin - no sores or suspicious lesions or rashes or color changes Genitals normal; both testes normal without tenderness, masses, hydroceles, varicoceles, erythema or swelling. Shaft normal, circumcised, meatus normal without discharge. No inguinal hernia noted. No inguinal lymphadenopathy. Rectal exam: negative without mass, lesions or tenderness, PROSTATE EXAM: smooth and symmetric without nodules or tenderness.   Pertinent labs:  None today  ASSESSMENT AND PLAN:   Weakness of both legs He describes normal muscle fatigue after hard work/exertion. Also likely somewhat affected by his recurrent leg cramps.  No myalgias,  no rash, no weakness or other neuro abnormalities on exam today. Reassured pt, encouraged good hydration habits and to workout and do manual/hard physical labor in moderation. Will check electrolytes, Mg, phosphorus to make sure no abnormality of these is contributing to his cramping.  Health maintenance examination Reviewed age and gender appropriate health maintenance issues (prudent diet, regular exercise, health risks of tobacco and excessive alcohol, use of seatbelts, fire alarms in home, use of sunscreen).  Also reviewed age and gender appropriate health screening as well as vaccine recommendations. Encouraged smoking cessation.   Will check fasting CMET, Mg, phos, CBC, and PSA. DRE today normal. Colon cancer screening UTD. Vaccines UTD.    An After Visit Summary was printed and given to the patient.  FOLLOW UP:  Return in about 1 year (around 04/29/2014) for CPE .

## 2013-05-23 ENCOUNTER — Encounter: Payer: Self-pay | Admitting: Family Medicine

## 2013-05-23 NOTE — Assessment & Plan Note (Addendum)
Reviewed age and gender appropriate health maintenance issues (prudent diet, regular exercise, health risks of tobacco and excessive alcohol, use of seatbelts, fire alarms in home, use of sunscreen).  Also reviewed age and gender appropriate health screening as well as vaccine recommendations. Encouraged smoking cessation.   Will check fasting CMET, Mg, phos, CBC, and PSA. DRE today normal. Colon cancer screening UTD. Vaccines UTD.

## 2013-05-23 NOTE — Assessment & Plan Note (Signed)
He describes normal muscle fatigue after hard work/exertion. Also likely somewhat affected by his recurrent leg cramps.  No myalgias, no rash, no weakness or other neuro abnormalities on exam today. Reassured pt, encouraged good hydration habits and to workout and do manual/hard physical labor in moderation. Will check electrolytes, Mg, phosphorus to make sure no abnormality of these is contributing to his cramping.

## 2013-07-24 DIAGNOSIS — R319 Hematuria, unspecified: Secondary | ICD-10-CM | POA: Diagnosis not present

## 2013-07-24 DIAGNOSIS — Z87442 Personal history of urinary calculi: Secondary | ICD-10-CM | POA: Diagnosis not present

## 2013-12-23 DIAGNOSIS — M48061 Spinal stenosis, lumbar region without neurogenic claudication: Secondary | ICD-10-CM | POA: Diagnosis not present

## 2013-12-23 DIAGNOSIS — M4000 Postural kyphosis, site unspecified: Secondary | ICD-10-CM | POA: Diagnosis not present

## 2013-12-23 DIAGNOSIS — IMO0002 Reserved for concepts with insufficient information to code with codable children: Secondary | ICD-10-CM | POA: Diagnosis not present

## 2013-12-23 DIAGNOSIS — M4310 Spondylolisthesis, site unspecified: Secondary | ICD-10-CM | POA: Diagnosis not present

## 2013-12-27 ENCOUNTER — Other Ambulatory Visit (HOSPITAL_BASED_OUTPATIENT_CLINIC_OR_DEPARTMENT_OTHER): Payer: Self-pay | Admitting: Specialist

## 2013-12-27 DIAGNOSIS — M545 Low back pain, unspecified: Secondary | ICD-10-CM

## 2013-12-27 DIAGNOSIS — I739 Peripheral vascular disease, unspecified: Secondary | ICD-10-CM

## 2013-12-27 DIAGNOSIS — M40209 Unspecified kyphosis, site unspecified: Secondary | ICD-10-CM

## 2013-12-28 ENCOUNTER — Ambulatory Visit (HOSPITAL_BASED_OUTPATIENT_CLINIC_OR_DEPARTMENT_OTHER): Payer: Medicare Other

## 2013-12-31 ENCOUNTER — Ambulatory Visit (HOSPITAL_BASED_OUTPATIENT_CLINIC_OR_DEPARTMENT_OTHER): Payer: Medicare Other

## 2013-12-31 ENCOUNTER — Ambulatory Visit (HOSPITAL_BASED_OUTPATIENT_CLINIC_OR_DEPARTMENT_OTHER)
Admission: RE | Admit: 2013-12-31 | Discharge: 2013-12-31 | Disposition: A | Discharge status: Home / Self Care | Payer: Medicare Other | Source: Ambulatory Visit | Attending: Specialist | Admitting: Specialist

## 2013-12-31 DIAGNOSIS — M47817 Spondylosis without myelopathy or radiculopathy, lumbosacral region: Secondary | ICD-10-CM | POA: Diagnosis not present

## 2013-12-31 DIAGNOSIS — M5380 Other specified dorsopathies, site unspecified: Secondary | ICD-10-CM | POA: Diagnosis not present

## 2013-12-31 DIAGNOSIS — M4120 Other idiopathic scoliosis, site unspecified: Secondary | ICD-10-CM | POA: Insufficient documentation

## 2013-12-31 DIAGNOSIS — M40209 Unspecified kyphosis, site unspecified: Secondary | ICD-10-CM

## 2013-12-31 DIAGNOSIS — M6281 Muscle weakness (generalized): Secondary | ICD-10-CM | POA: Diagnosis not present

## 2013-12-31 DIAGNOSIS — M545 Low back pain, unspecified: Secondary | ICD-10-CM | POA: Diagnosis not present

## 2013-12-31 DIAGNOSIS — M48061 Spinal stenosis, lumbar region without neurogenic claudication: Secondary | ICD-10-CM | POA: Insufficient documentation

## 2013-12-31 DIAGNOSIS — I739 Peripheral vascular disease, unspecified: Secondary | ICD-10-CM

## 2013-12-31 DIAGNOSIS — R269 Unspecified abnormalities of gait and mobility: Secondary | ICD-10-CM | POA: Diagnosis not present

## 2014-01-08 DIAGNOSIS — M48061 Spinal stenosis, lumbar region without neurogenic claudication: Secondary | ICD-10-CM | POA: Diagnosis not present

## 2014-01-13 DIAGNOSIS — M6281 Muscle weakness (generalized): Secondary | ICD-10-CM | POA: Diagnosis not present

## 2014-01-13 DIAGNOSIS — M4180 Other forms of scoliosis, site unspecified: Secondary | ICD-10-CM | POA: Diagnosis not present

## 2014-01-16 DIAGNOSIS — M6281 Muscle weakness (generalized): Secondary | ICD-10-CM | POA: Diagnosis not present

## 2014-01-16 DIAGNOSIS — M4180 Other forms of scoliosis, site unspecified: Secondary | ICD-10-CM | POA: Diagnosis not present

## 2014-01-18 DIAGNOSIS — M6281 Muscle weakness (generalized): Secondary | ICD-10-CM | POA: Diagnosis not present

## 2014-01-18 DIAGNOSIS — M4180 Other forms of scoliosis, site unspecified: Secondary | ICD-10-CM | POA: Diagnosis not present

## 2014-01-22 DIAGNOSIS — M4180 Other forms of scoliosis, site unspecified: Secondary | ICD-10-CM | POA: Diagnosis not present

## 2014-01-22 DIAGNOSIS — M6281 Muscle weakness (generalized): Secondary | ICD-10-CM | POA: Diagnosis not present

## 2014-01-25 DIAGNOSIS — M6281 Muscle weakness (generalized): Secondary | ICD-10-CM | POA: Diagnosis not present

## 2014-01-25 DIAGNOSIS — M4180 Other forms of scoliosis, site unspecified: Secondary | ICD-10-CM | POA: Diagnosis not present

## 2014-01-28 DIAGNOSIS — M6281 Muscle weakness (generalized): Secondary | ICD-10-CM | POA: Diagnosis not present

## 2014-01-28 DIAGNOSIS — M4180 Other forms of scoliosis, site unspecified: Secondary | ICD-10-CM | POA: Diagnosis not present

## 2014-01-29 DIAGNOSIS — Z87442 Personal history of urinary calculi: Secondary | ICD-10-CM | POA: Diagnosis not present

## 2014-01-29 DIAGNOSIS — N200 Calculus of kidney: Secondary | ICD-10-CM | POA: Diagnosis not present

## 2014-01-29 DIAGNOSIS — R319 Hematuria, unspecified: Secondary | ICD-10-CM | POA: Diagnosis not present

## 2014-02-01 DIAGNOSIS — M4180 Other forms of scoliosis, site unspecified: Secondary | ICD-10-CM | POA: Diagnosis not present

## 2014-02-01 DIAGNOSIS — M6281 Muscle weakness (generalized): Secondary | ICD-10-CM | POA: Diagnosis not present

## 2014-02-04 DIAGNOSIS — M4180 Other forms of scoliosis, site unspecified: Secondary | ICD-10-CM | POA: Diagnosis not present

## 2014-02-04 DIAGNOSIS — M6281 Muscle weakness (generalized): Secondary | ICD-10-CM | POA: Diagnosis not present

## 2014-02-05 DIAGNOSIS — M48061 Spinal stenosis, lumbar region without neurogenic claudication: Secondary | ICD-10-CM | POA: Diagnosis not present

## 2014-02-05 DIAGNOSIS — IMO0002 Reserved for concepts with insufficient information to code with codable children: Secondary | ICD-10-CM | POA: Diagnosis not present

## 2014-02-12 DIAGNOSIS — M6281 Muscle weakness (generalized): Secondary | ICD-10-CM | POA: Diagnosis not present

## 2014-02-12 DIAGNOSIS — M4180 Other forms of scoliosis, site unspecified: Secondary | ICD-10-CM | POA: Diagnosis not present

## 2014-02-15 DIAGNOSIS — M4180 Other forms of scoliosis, site unspecified: Secondary | ICD-10-CM | POA: Diagnosis not present

## 2014-02-15 DIAGNOSIS — M6281 Muscle weakness (generalized): Secondary | ICD-10-CM | POA: Diagnosis not present

## 2014-02-19 ENCOUNTER — Other Ambulatory Visit (HOSPITAL_BASED_OUTPATIENT_CLINIC_OR_DEPARTMENT_OTHER): Payer: Self-pay | Admitting: Physician Assistant

## 2014-02-19 DIAGNOSIS — R319 Hematuria, unspecified: Secondary | ICD-10-CM

## 2014-02-20 DIAGNOSIS — R319 Hematuria, unspecified: Secondary | ICD-10-CM | POA: Diagnosis not present

## 2014-02-22 DIAGNOSIS — M6281 Muscle weakness (generalized): Secondary | ICD-10-CM | POA: Diagnosis not present

## 2014-02-22 DIAGNOSIS — M4180 Other forms of scoliosis, site unspecified: Secondary | ICD-10-CM | POA: Diagnosis not present

## 2014-02-25 DIAGNOSIS — M545 Low back pain, unspecified: Secondary | ICD-10-CM | POA: Diagnosis not present

## 2014-02-25 DIAGNOSIS — M48061 Spinal stenosis, lumbar region without neurogenic claudication: Secondary | ICD-10-CM | POA: Diagnosis not present

## 2014-02-27 ENCOUNTER — Ambulatory Visit (HOSPITAL_BASED_OUTPATIENT_CLINIC_OR_DEPARTMENT_OTHER)
Admission: RE | Admit: 2014-02-27 | Discharge: 2014-02-27 | Disposition: A | Discharge status: Home / Self Care | Payer: Medicare Other | Source: Ambulatory Visit | Attending: Physician Assistant | Admitting: Physician Assistant

## 2014-02-27 ENCOUNTER — Encounter (HOSPITAL_BASED_OUTPATIENT_CLINIC_OR_DEPARTMENT_OTHER): Payer: Self-pay

## 2014-02-27 DIAGNOSIS — M954 Acquired deformity of chest and rib: Secondary | ICD-10-CM | POA: Insufficient documentation

## 2014-02-27 DIAGNOSIS — N1330 Unspecified hydronephrosis: Secondary | ICD-10-CM | POA: Diagnosis not present

## 2014-02-27 DIAGNOSIS — R319 Hematuria, unspecified: Secondary | ICD-10-CM | POA: Insufficient documentation

## 2014-02-27 DIAGNOSIS — N201 Calculus of ureter: Secondary | ICD-10-CM | POA: Diagnosis not present

## 2014-02-27 DIAGNOSIS — M47817 Spondylosis without myelopathy or radiculopathy, lumbosacral region: Secondary | ICD-10-CM | POA: Diagnosis not present

## 2014-02-27 DIAGNOSIS — N200 Calculus of kidney: Secondary | ICD-10-CM | POA: Insufficient documentation

## 2014-02-27 MED ORDER — IOHEXOL 300 MG/ML  SOLN
125.0000 mL | Freq: Once | INTRAMUSCULAR | Status: AC | PRN
  Administered 2014-02-27: 125 mL via INTRAVENOUS

## 2014-02-28 DIAGNOSIS — M48061 Spinal stenosis, lumbar region without neurogenic claudication: Secondary | ICD-10-CM | POA: Diagnosis not present

## 2014-03-06 DIAGNOSIS — N200 Calculus of kidney: Secondary | ICD-10-CM | POA: Diagnosis not present

## 2014-03-10 DIAGNOSIS — N201 Calculus of ureter: Secondary | ICD-10-CM | POA: Diagnosis not present

## 2014-03-11 DIAGNOSIS — M4120 Other idiopathic scoliosis, site unspecified: Secondary | ICD-10-CM | POA: Diagnosis not present

## 2014-03-11 DIAGNOSIS — M545 Low back pain, unspecified: Secondary | ICD-10-CM | POA: Diagnosis not present

## 2014-03-11 DIAGNOSIS — M47817 Spondylosis without myelopathy or radiculopathy, lumbosacral region: Secondary | ICD-10-CM | POA: Diagnosis not present

## 2014-03-25 DIAGNOSIS — N200 Calculus of kidney: Secondary | ICD-10-CM | POA: Diagnosis not present

## 2014-03-27 DIAGNOSIS — M48061 Spinal stenosis, lumbar region without neurogenic claudication: Secondary | ICD-10-CM | POA: Diagnosis not present

## 2014-03-27 DIAGNOSIS — IMO0002 Reserved for concepts with insufficient information to code with codable children: Secondary | ICD-10-CM | POA: Diagnosis not present

## 2014-03-27 DIAGNOSIS — M4000 Postural kyphosis, site unspecified: Secondary | ICD-10-CM | POA: Diagnosis not present

## 2014-04-16 ENCOUNTER — Encounter: Payer: Self-pay | Admitting: Family Medicine

## 2014-04-16 ENCOUNTER — Telehealth: Payer: Self-pay | Admitting: Family Medicine

## 2014-04-16 ENCOUNTER — Ambulatory Visit (INDEPENDENT_AMBULATORY_CARE_PROVIDER_SITE_OTHER): Payer: Medicare Other | Admitting: Family Medicine

## 2014-04-16 VITALS — BP 132/78 | HR 64 | Temp 98.5°F | Resp 16 | Ht 68.0 in | Wt 172.0 lb

## 2014-04-16 DIAGNOSIS — R5381 Other malaise: Secondary | ICD-10-CM

## 2014-04-16 DIAGNOSIS — Z0389 Encounter for observation for other suspected diseases and conditions ruled out: Secondary | ICD-10-CM

## 2014-04-16 DIAGNOSIS — M545 Low back pain, unspecified: Secondary | ICD-10-CM

## 2014-04-16 DIAGNOSIS — Z0000 Encounter for general adult medical examination without abnormal findings: Secondary | ICD-10-CM

## 2014-04-16 DIAGNOSIS — R252 Cramp and spasm: Secondary | ICD-10-CM

## 2014-04-16 DIAGNOSIS — Z23 Encounter for immunization: Secondary | ICD-10-CM | POA: Diagnosis not present

## 2014-04-16 DIAGNOSIS — G8929 Other chronic pain: Secondary | ICD-10-CM

## 2014-04-16 DIAGNOSIS — R5383 Other fatigue: Secondary | ICD-10-CM

## 2014-04-16 LAB — COMPREHENSIVE METABOLIC PANEL
ALT: 30 U/L (ref 0–53)
AST: 28 U/L (ref 0–37)
Albumin: 4.2 g/dL (ref 3.5–5.2)
Alkaline Phosphatase: 59 U/L (ref 39–117)
BUN: 16 mg/dL (ref 6–23)
CO2: 31 meq/L (ref 19–32)
Calcium: 9.6 mg/dL (ref 8.4–10.5)
Chloride: 102 mEq/L (ref 96–112)
Creatinine, Ser: 0.7 mg/dL (ref 0.4–1.5)
GFR: 125.36 mL/min (ref >60.00)
GLUCOSE: 111 mg/dL — AB (ref 70–99)
POTASSIUM: 5.1 meq/L (ref 3.5–5.1)
Sodium: 140 mEq/L (ref 135–145)
TOTAL PROTEIN: 6.7 g/dL (ref 6.0–8.3)
Total Bilirubin: 1 mg/dL (ref 0.2–1.2)

## 2014-04-16 LAB — TSH: TSH: 1.52 u[IU]/mL (ref 0.35–4.50)

## 2014-04-16 LAB — PSA, MEDICARE: PSA: 0.69 ng/ml (ref 0.10–4.00)

## 2014-04-16 LAB — CBC WITH DIFFERENTIAL/PLATELET
Basophils Absolute: 0 10*3/uL (ref 0.0–0.1)
Basophils Relative: 0.4 % (ref 0.0–3.0)
EOS PCT: 3.9 % (ref 0.0–5.0)
Eosinophils Absolute: 0.3 10*3/uL (ref 0.0–0.7)
HEMATOCRIT: 43.7 % (ref 39.0–52.0)
Hemoglobin: 14.5 g/dL (ref 13.0–17.0)
LYMPHS ABS: 1.7 10*3/uL (ref 0.7–4.0)
Lymphocytes Relative: 22.7 % (ref 12.0–46.0)
MCHC: 33.1 g/dL (ref 30.0–36.0)
MCV: 93.6 fl (ref 78.0–100.0)
MONOS PCT: 9.1 % (ref 3.0–12.0)
Monocytes Absolute: 0.7 10*3/uL (ref 0.1–1.0)
Neutro Abs: 4.8 10*3/uL (ref 1.4–7.7)
Neutrophils Relative %: 63.9 % (ref 43.0–77.0)
Platelets: 246 10*3/uL (ref 150.0–400.0)
RBC: 4.67 Mil/uL (ref 4.22–5.81)
RDW: 13 % (ref 11.5–15.5)
WBC: 7.5 10*3/uL (ref 4.0–10.5)

## 2014-04-16 LAB — LIPID PANEL
Cholesterol: 183 mg/dL (ref 0–200)
HDL: 47.1 mg/dL (ref >39.00)
LDL Cholesterol: 123 mg/dL — ABNORMAL HIGH (ref 0–99)
NONHDL: 135.9
Total CHOL/HDL Ratio: 4
Triglycerides: 66 mg/dL (ref 0.0–149.0)
VLDL: 13.2 mg/dL (ref 0.0–40.0)

## 2014-04-16 NOTE — Assessment & Plan Note (Addendum)
Reviewed age and gender appropriate health maintenance issues (prudent diet, regular exercise, health risks of tobacco and excessive alcohol, use of seatbelts, fire alarms in home, use of sunscreen).  Also reviewed age and gender appropriate health screening as well as vaccine recommendations. Prevnar 13 today. HP labs + PSA today.

## 2014-04-16 NOTE — Progress Notes (Signed)
Pre visit review using our clinic review tool, if applicable. No additional management support is needed unless otherwise documented below in the visit note. 

## 2014-04-16 NOTE — Telephone Encounter (Signed)
Relevant patient education mailed to patient.  

## 2014-04-16 NOTE — Progress Notes (Addendum)
Office Note 04/16/2014  CC:  Chief Complaint  Patient presents with  . Annual Exam    fasting    HPI:  Ethan Rose is a 68 y.o. White male who is here for CPE. LBP/LE weakness issues: Dr. Louanne Skye with [facility] locally sent him to [facility] b/c surgery risky. [place] said the surgery would be long and arduous, done in two stages, pins and rods insertion, lots of time required off work and lots of potential complications. Pt feels like risks outweigh benefits and he chose to do home exercise regimen with stationary bike. He also got steroid injections by Dr. Ernestina Patches at [facility] and these didn't help much. He has left drop-foot and is set up to get an orthotic soon.  He quit smoking 02/12/14!  Has still been having pretty frequent leg cramps bilaterally, mostly calves, mostly nocturnal.  Has more trouble staying hydrated in summer months.   Feeling fatigued lately, appetite fair, denies excessive daytime somnolence, just feels generalized "body tired".  Thinks it may be from all the stress over his back lately, medical visits/testing, decisions to make, etc.  Denies depression.   Past Medical History  Diagnosis Date  . Kidney calculi     Multiple passed in the past.   . GERD (gastroesophageal reflux disease)   . Hay fever   . Tobacco dependence   . Chronic low back pain     Past Surgical History  Procedure Laterality Date  . Lithotripsy      Multiple  . Hernia repair  1993    Bilateral inguinal (urologist did these procedures)  . Tonsillectomy  1954  . Colonoscopy  approx 2011    Dr. [name] (normal per pt report)    Family History  Problem Relation Age of Onset  . Stroke Mother   . Stroke Father     History   Social History  . Marital Status: Married    Spouse Name: N/A    Number of Children: N/A  . Years of Education: N/A   Occupational History  . Not on file.   Social History Main Topics  . Smoking status: Current Every Day Smoker -- 0.50 packs/day   . Smokeless tobacco: Never Used  . Alcohol Use: Yes  . Drug Use: No  . Sexual Activity: No   Other Topics Concern  . Not on file   Social History Narrative   Married, has one daughter and one grandson.   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   Orig from [place].  Has lived in [place] x 25 yrs.   Tobacco 50 pack-yr hx.  Alcohol: 2 beers per day avg.  No drugs.   Exercise: none.  Has active job/physical labor.    Outpatient Prescriptions Prior to Visit  Medication Sig Dispense Refill  . cholecalciferol (VITAMIN D) 400 UNITS TABS Take by mouth.      Marland Kitchen CINNAMON PO Take 1 tablet by mouth daily.      . Multiple Vitamin (MULITIVITAMIN WITH MINERALS) TABS Take 1 tablet by mouth daily.      Marland Kitchen OVER THE COUNTER MEDICATION Take 1 tablet by mouth daily. Vitamin K2      . OVER THE COUNTER MEDICATION 1 capful by mouth once a day OPC antioxidant      . VITAMIN E PO Take 1 tablet by mouth daily.      Marland Kitchen co-enzyme Q-10 30 MG capsule Take 30 mg by mouth 3 (three) times daily.      Marland Kitchen HYDROcodone-acetaminophen (  VICODIN) 5-500 MG per tablet as directed. For kidney stone      . OVER THE COUNTER MEDICATION Take 1 tablet by mouth 2 (two) times daily. Arthritis Eeze      . Pyridoxine HCl (VITAMIN B-6 PO) Take 1 tablet by mouth daily.      . Tamsulosin HCl (FLOMAX) 0.4 MG CAPS Take 1 tablet by mouth Daily.       No facility-administered medications prior to visit.    No Known Allergies  ROS Review of Systems  Constitutional: Negative for fever, chills, appetite change and fatigue.  HENT: Negative for congestion, dental problem, ear pain and sore throat.   Eyes: Negative for discharge, redness and visual disturbance.  Respiratory: Negative for cough, chest tightness, shortness of breath and wheezing.   Cardiovascular: Negative for chest pain, palpitations and leg swelling.  Gastrointestinal: Negative for nausea, vomiting, abdominal pain, diarrhea and blood in stool.  Genitourinary: Negative for  dysuria, urgency, frequency, hematuria, flank pain and difficulty urinating.  Musculoskeletal: Positive for back pain (as per HPI). Negative for arthralgias, joint swelling, myalgias and neck stiffness.  Skin: Negative for pallor and rash.  Neurological: Positive for weakness (left ankle--foot drop due to back problem). Negative for dizziness, speech difficulty and headaches.  Hematological: Negative for adenopathy. Does not bruise/bleed easily.  Psychiatric/Behavioral: Negative for confusion and sleep disturbance. The patient is not nervous/anxious.     PE; Blood pressure 132/78, pulse 64, temperature 98.5 F (36.9 C), temperature source Temporal, resp. rate 16, height 5\' 8"  (1.727 m), weight 172 lb (78.019 kg), SpO2 95.00%. Gen: Alert, well appearing.  Patient is oriented to person, place, time, and situation. AFFECT: pleasant, lucid thought and speech. ENT: Ears: EACs clear, normal epithelium.  TMs with good light reflex and landmarks bilaterally.  Eyes: no injection, icteris, swelling, or exudate.  EOMI, PERRLA. Nose: no drainage or turbinate edema/swelling.  No injection or focal lesion.  Mouth: lips without lesion/swelling.  Oral mucosa pink and moist.  Dentition intact and without obvious caries or gingival swelling.  Oropharynx without erythema, exudate, or swelling.  Neck: supple/nontender.  No LAD, mass, or TM.  Carotid pulses 2+ bilaterally, without bruits. CV: RRR, no m/r/g.   LUNGS: CTA bilat, nonlabored resps, good aeration in all lung fields. ABD: soft, NT, ND, BS normal.  No hepatospenomegaly or mass.  No bruits. EXT: no clubbing, cyanosis, or edema.  Musculoskeletal: no joint swelling, erythema, warmth, or tenderness.  ROM of all joints intact. Skin - no sores or suspicious lesions or rashes or color changes Rectal exam: negative without mass, lesions or tenderness, PROSTATE EXAM: smooth and symmetric without nodules or tenderness.   Pertinent labs:  None today  Recent  imaging: MR L spine w/out contrast 12/27/13: Impression: 1. There is multilevel spondylosis associated with a biconvex  scoliosis.  2. There is mild multifactorial spinal stenosis at L3-4 and L4-5. At  both levels, there is mild to moderate narrowing of the lateral  recesses and foramina, worse on the right. The potential for nerve  root encroachment seems greatest within the right L3-4 foramen.  3. No focal disc herniation, high-grade spinal stenosis or gross  nerve root compression.  CT abd/pelv w w/o contrast 02/19/14: Impression: No radiographic evidence of urinary tract neoplasm.  7 mm calculus at the right ureteropelvic junction, causing mild  right hydronephrosis.  Bilateral nephrolithiasis.    ASSESSMENT AND PLAN:   1) Chronic low back pain: Lumbar DDD/spondylosis with stenosis. Pt opting for non-surgical route at this  time.  Continuing care with [facility].  No rx's for this given today.  2) Tobacco dependence: in remission since 02/2014. Check FLP today.  3) Fatigue: likely stress/chronic low back pain, but will check CBC, CMET, and TSH today.  4) Prev health care: DRE normal, PSA testing done today.  Prevnar 13 IM given today.  5) Recurrent nocturnal leg cramps: complicated by his spinal stenosis.  Encouraged pt to maintain good hydration. Check electrolytes today.  An After Visit Summary was printed and given to the patient.  FOLLOW UP:  1 yr for Annual medicare wellness exam.

## 2014-04-17 ENCOUNTER — Ambulatory Visit: Payer: Medicare Other

## 2014-04-17 ENCOUNTER — Other Ambulatory Visit: Payer: Self-pay | Admitting: Family Medicine

## 2014-04-17 DIAGNOSIS — R7303 Prediabetes: Secondary | ICD-10-CM

## 2014-04-17 DIAGNOSIS — R7301 Impaired fasting glucose: Secondary | ICD-10-CM

## 2014-04-17 LAB — HEMOGLOBIN A1C: HEMOGLOBIN A1C: 6.4 % (ref 4.6–6.5)

## 2014-04-29 ENCOUNTER — Encounter: Payer: Medicare Other | Admitting: Family Medicine

## 2014-05-14 ENCOUNTER — Encounter: Payer: Self-pay | Admitting: Family Medicine

## 2014-05-14 DIAGNOSIS — R5383 Other fatigue: Secondary | ICD-10-CM

## 2014-05-14 DIAGNOSIS — R5381 Other malaise: Secondary | ICD-10-CM | POA: Insufficient documentation

## 2014-05-14 NOTE — Addendum Note (Signed)
Addended by: Tammi Sou on: 05/14/2014 05:50 AM   Modules accepted: Level of Service

## 2014-07-15 IMAGING — CT CT ABD-PEL WO/W CM
3 of 10 series · 13 of 46 positions shown, 19 images · IV contrast (APPLIED)
Comparison: None.

CLINICAL DATA: Painless hematuria.  Urolithiasis.

EXAM:
CT ABDOMEN AND PELVIS WITHOUT AND WITH CONTRAST
TECHNIQUE: Multidetector CT imaging of the abdomen and pelvis was performed
without contrast material in one or both body regions, followed by
contrast material(s) and further sections in one or both body
regions.
CONTRAST:  125mL OMNIPAQUE IOHEXOL 300 MG/ML  SOLN

[Series 4: hematuria > 45 without coronal · coronal · non-contrast · 0.84mm/px · 2 of 138 slices shown, 3 images]
[im 46/138  soft-tissue]
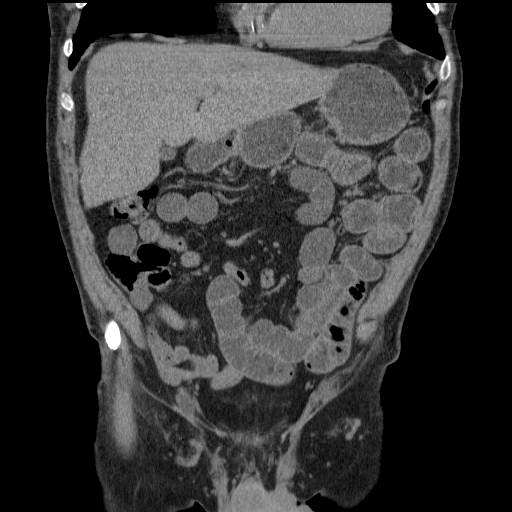
[im 46/138  bone]
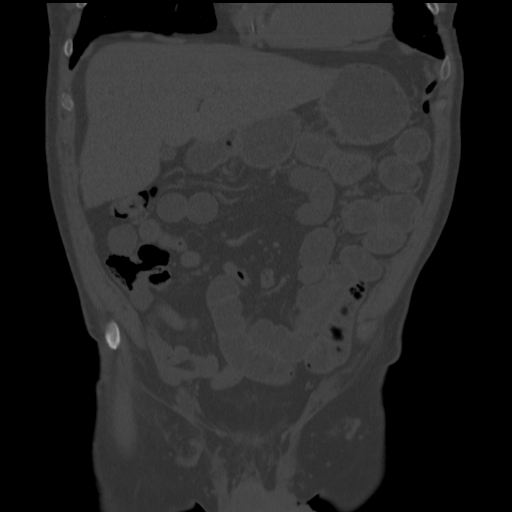
[im 92/138  soft-tissue]
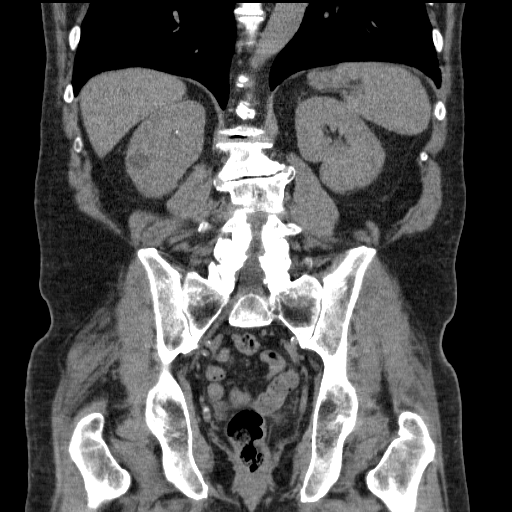

[Series 6: hematuria > 45 with · axial · 0.75mm/px · z∈[-555,-185]mm · 8 of 96 slices shown, 13 images]
[im 11/96  soft-tissue]
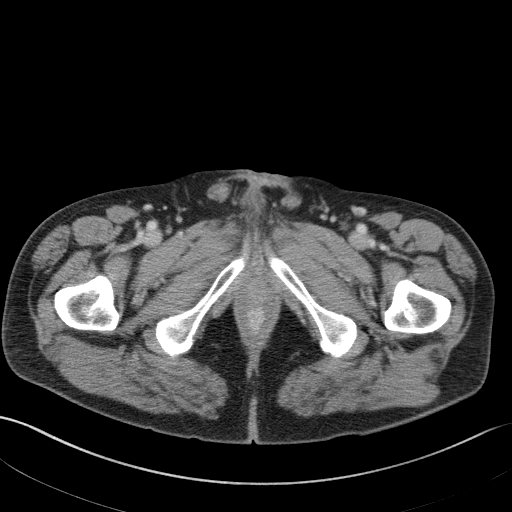
[im 11/96  bone]
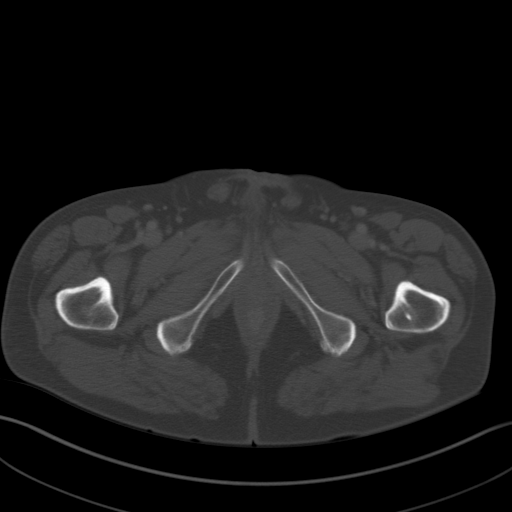
[im 22/96  soft-tissue]
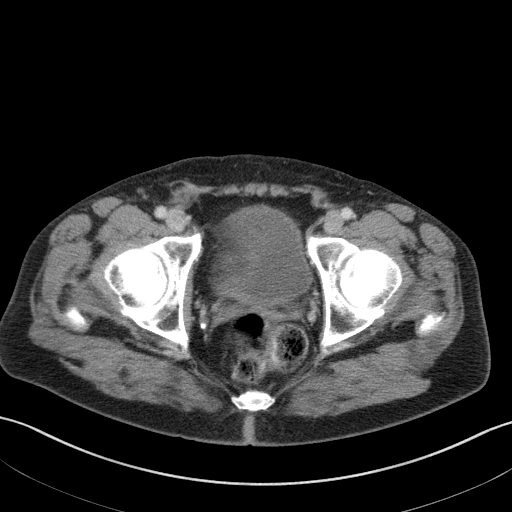
[im 32/96  soft-tissue]
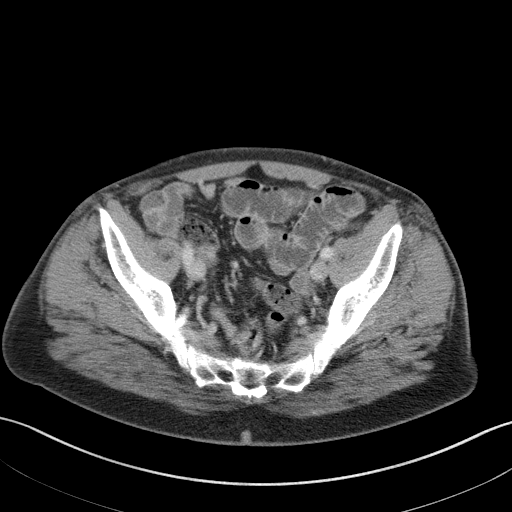
[im 43/96  soft-tissue]
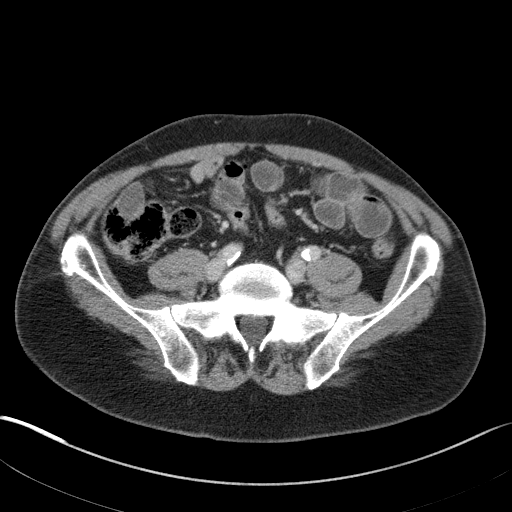
[im 53/96  soft-tissue]
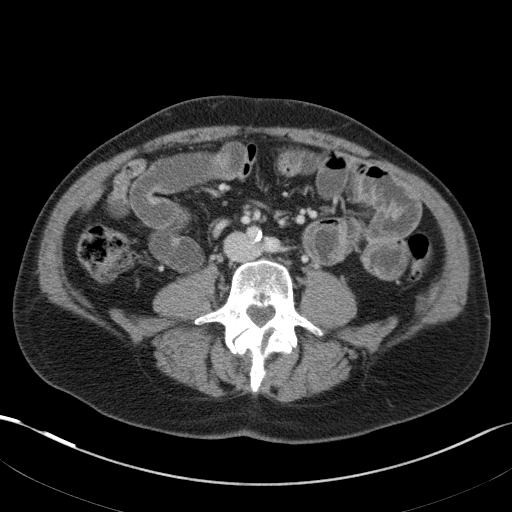
[im 53/96  lung]
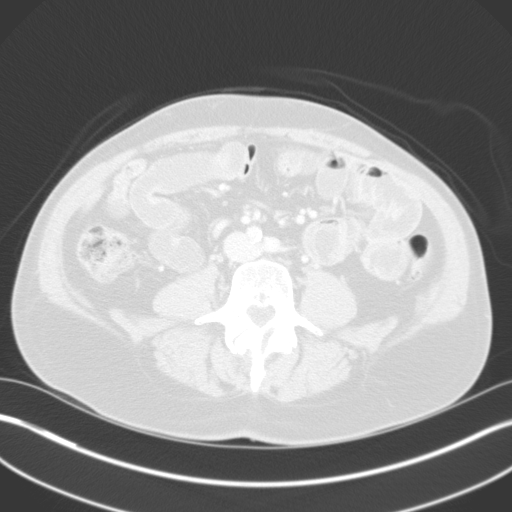
[im 64/96  soft-tissue]
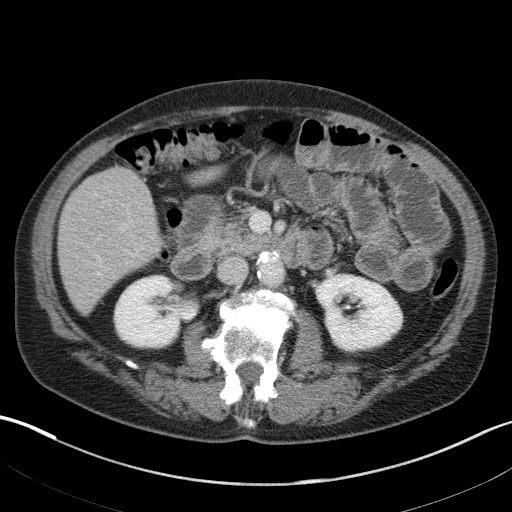
[im 64/96  lung]
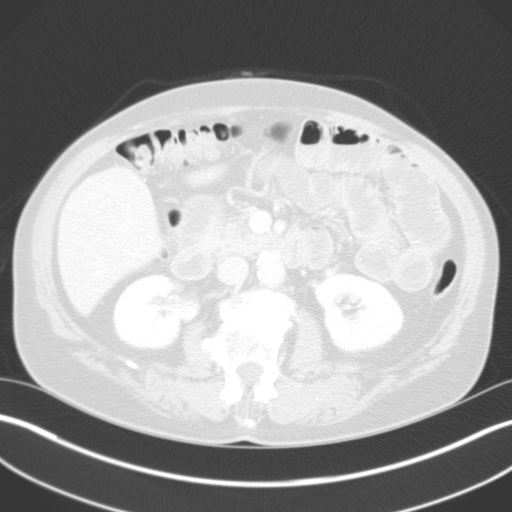
[im 74/96  soft-tissue]
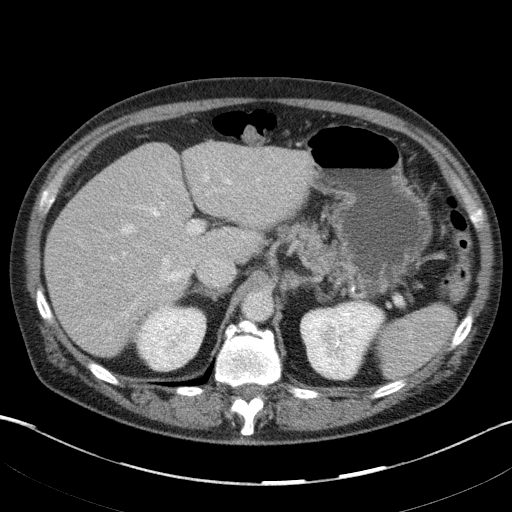
[im 74/96  lung]
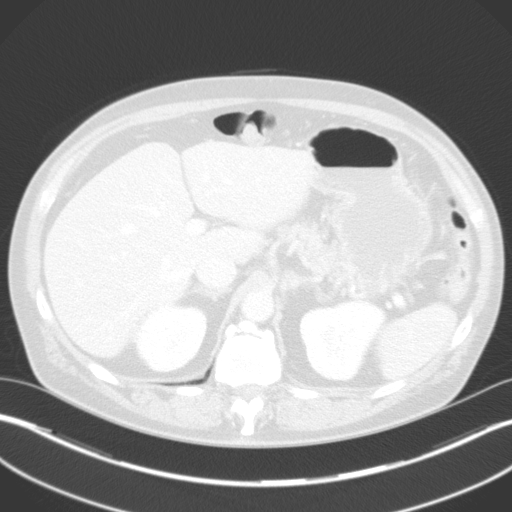
[im 85/96  soft-tissue]
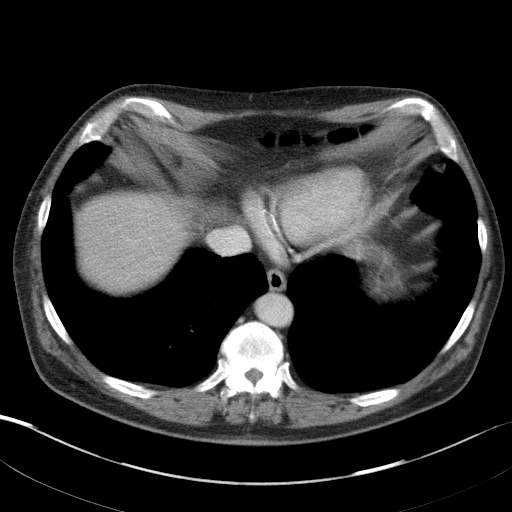
[im 85/96  lung]
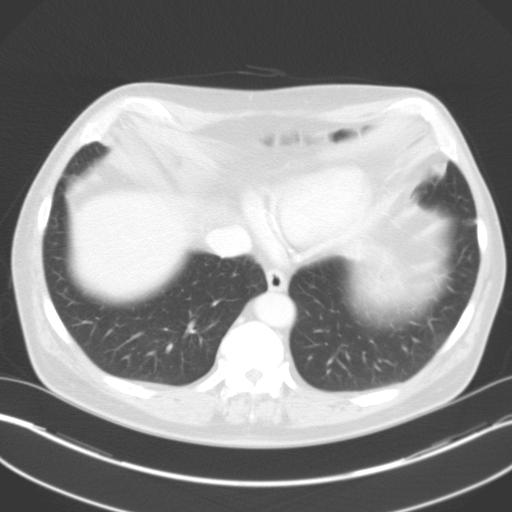

[Series 12: delay · axial · delayed · 0.75mm/px · z∈[-460,-360]mm · 3 of 91 slices shown]
[im 11/91  soft-tissue]
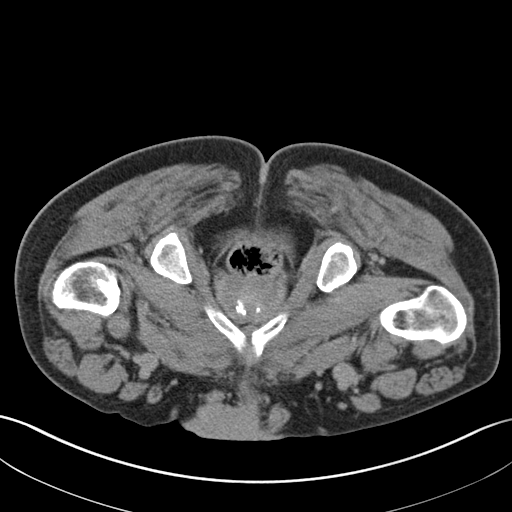
[im 21/91  soft-tissue]
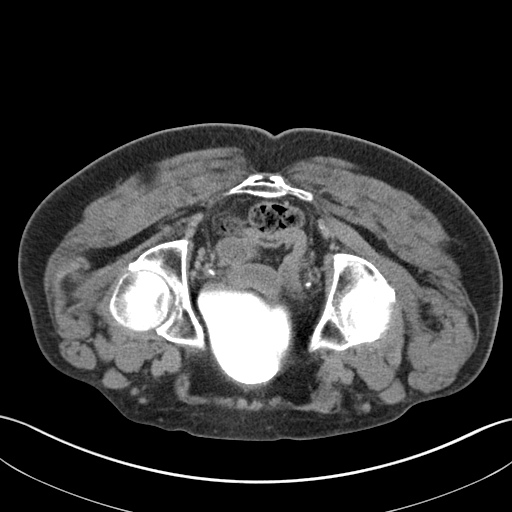
[im 31/91  soft-tissue]
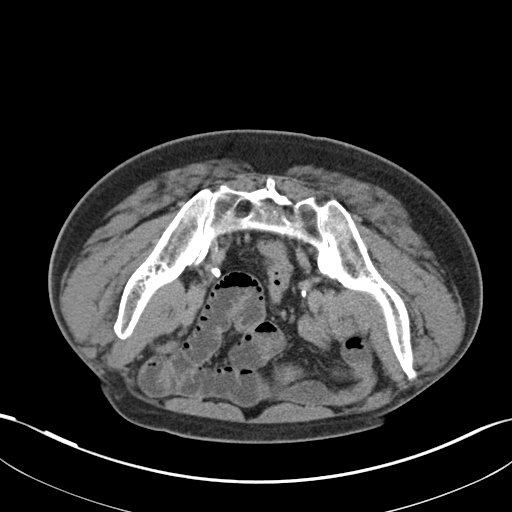

[13 of 46 positions shown; findings below may reference images not displayed]

FINDINGS: Multiple tiny less than 1 cm nonobstructive calculi are seen in both
kidneys. A 7 mm calculus is seen at the right ureteropelvic junction
which causes mild right hydronephrosis. No evidence of left-sided
hydronephrosis or ureteral calculi. No bladder calculi identified.

A small simple cyst is noted in the midpole of the right kidney. No
complex cystic or solid renal masses are seen involving either
kidney. No evidence of masses or filling defects involving the renal
collecting systems, ureters, or bladder. Prostate and seminal
vesicles are unremarkable in appearance.

The liver, gallbladder, spleen, pancreas, and adrenal glands are
normal in appearance. No lymphadenopathy identified within the
abdomen or pelvis. No evidence of inflammatory process or abnormal
fluid collections. No evidence of bowel wall thickening or
dilatation. No suspicious bone lesions identified. Pectus excavatum
noted as well as severe lumbar spondylosis.
IMPRESSION: No radiographic evidence of urinary tract neoplasm.

7 mm calculus at the right ureteropelvic junction, causing mild
right hydronephrosis.

Bilateral nephrolithiasis.

## 2014-07-18 ENCOUNTER — Other Ambulatory Visit: Payer: Medicare Other

## 2014-07-25 ENCOUNTER — Other Ambulatory Visit (INDEPENDENT_AMBULATORY_CARE_PROVIDER_SITE_OTHER): Payer: Medicare Other

## 2014-07-25 DIAGNOSIS — R7309 Other abnormal glucose: Secondary | ICD-10-CM

## 2014-07-25 DIAGNOSIS — R7303 Prediabetes: Secondary | ICD-10-CM

## 2014-07-25 LAB — HEMOGLOBIN A1C: Hgb A1c MFr Bld: 6.4 % (ref 4.6–6.5)

## 2014-07-30 ENCOUNTER — Encounter: Payer: Self-pay | Admitting: Family Medicine

## 2014-08-05 DIAGNOSIS — M5430 Sciatica, unspecified side: Secondary | ICD-10-CM | POA: Diagnosis not present

## 2014-08-05 DIAGNOSIS — M545 Low back pain, unspecified: Secondary | ICD-10-CM | POA: Diagnosis not present

## 2014-09-30 DIAGNOSIS — M4120 Other idiopathic scoliosis, site unspecified: Secondary | ICD-10-CM | POA: Diagnosis not present

## 2014-09-30 DIAGNOSIS — M419 Scoliosis, unspecified: Secondary | ICD-10-CM | POA: Diagnosis not present

## 2014-09-30 DIAGNOSIS — M4317 Spondylolisthesis, lumbosacral region: Secondary | ICD-10-CM | POA: Diagnosis not present

## 2014-09-30 DIAGNOSIS — Z6825 Body mass index (BMI) 25.0-25.9, adult: Secondary | ICD-10-CM | POA: Diagnosis not present

## 2014-09-30 DIAGNOSIS — R030 Elevated blood-pressure reading, without diagnosis of hypertension: Secondary | ICD-10-CM | POA: Diagnosis not present

## 2014-09-30 DIAGNOSIS — M5416 Radiculopathy, lumbar region: Secondary | ICD-10-CM | POA: Diagnosis not present

## 2014-10-01 ENCOUNTER — Ambulatory Visit
Admission: RE | Admit: 2014-10-01 | Discharge: 2014-10-01 | Disposition: A | Discharge status: Home / Self Care | Payer: Medicare Other | Source: Ambulatory Visit | Attending: Neurosurgery | Admitting: Neurosurgery

## 2014-10-01 ENCOUNTER — Other Ambulatory Visit: Payer: Self-pay | Admitting: Neurosurgery

## 2014-10-01 DIAGNOSIS — M4185 Other forms of scoliosis, thoracolumbar region: Secondary | ICD-10-CM | POA: Diagnosis not present

## 2014-10-01 DIAGNOSIS — M4120 Other idiopathic scoliosis, site unspecified: Secondary | ICD-10-CM

## 2014-10-02 DIAGNOSIS — Z0000 Encounter for general adult medical examination without abnormal findings: Secondary | ICD-10-CM | POA: Diagnosis not present

## 2014-10-03 DIAGNOSIS — M4000 Postural kyphosis, site unspecified: Secondary | ICD-10-CM | POA: Diagnosis not present

## 2014-10-03 DIAGNOSIS — M5440 Lumbago with sciatica, unspecified side: Secondary | ICD-10-CM | POA: Diagnosis not present

## 2014-11-05 DIAGNOSIS — R030 Elevated blood-pressure reading, without diagnosis of hypertension: Secondary | ICD-10-CM | POA: Diagnosis not present

## 2014-11-05 DIAGNOSIS — M4120 Other idiopathic scoliosis, site unspecified: Secondary | ICD-10-CM | POA: Diagnosis not present

## 2014-11-05 DIAGNOSIS — M5416 Radiculopathy, lumbar region: Secondary | ICD-10-CM | POA: Diagnosis not present

## 2014-11-05 DIAGNOSIS — Z6825 Body mass index (BMI) 25.0-25.9, adult: Secondary | ICD-10-CM | POA: Diagnosis not present

## 2014-11-05 DIAGNOSIS — M4317 Spondylolisthesis, lumbosacral region: Secondary | ICD-10-CM | POA: Diagnosis not present

## 2014-11-05 DIAGNOSIS — M419 Scoliosis, unspecified: Secondary | ICD-10-CM | POA: Diagnosis not present

## 2014-12-17 DIAGNOSIS — R030 Elevated blood-pressure reading, without diagnosis of hypertension: Secondary | ICD-10-CM | POA: Diagnosis not present

## 2014-12-17 DIAGNOSIS — M4120 Other idiopathic scoliosis, site unspecified: Secondary | ICD-10-CM | POA: Diagnosis not present

## 2014-12-17 DIAGNOSIS — M4317 Spondylolisthesis, lumbosacral region: Secondary | ICD-10-CM | POA: Diagnosis not present

## 2014-12-17 DIAGNOSIS — M419 Scoliosis, unspecified: Secondary | ICD-10-CM | POA: Diagnosis not present

## 2014-12-17 DIAGNOSIS — M5416 Radiculopathy, lumbar region: Secondary | ICD-10-CM | POA: Diagnosis not present

## 2014-12-17 DIAGNOSIS — Z6825 Body mass index (BMI) 25.0-25.9, adult: Secondary | ICD-10-CM | POA: Diagnosis not present

## 2014-12-25 ENCOUNTER — Other Ambulatory Visit: Payer: Self-pay

## 2014-12-25 ENCOUNTER — Other Ambulatory Visit: Payer: Self-pay | Admitting: Neurosurgery

## 2014-12-26 ENCOUNTER — Telehealth: Payer: Self-pay | Admitting: Vascular Surgery

## 2014-12-26 NOTE — Telephone Encounter (Signed)
-----   Message from Sherrye Payor, RN sent at 12/26/2014  3:42 PM EST ----- Regarding: RE: needs consult appt with Dr. Markus Daft: [phone]  ----- Message -----    From: Gena Fray    Sent: 12/26/2014  12:42 PM      To: Lynetta Mare Pullins, RN Subject: RE: needs consult appt with Dr. Rondel Oh,  Do you happen to have a phone # for this patient? There is not one listed in EPIC.  Thanks, Hinton Dyer ----- Message -----    From: Sherrye Payor, RN    Sent: 12/25/2014   3:38 PM      To: [facility] Admin Pool Subject: needs consult appt with Dr. Donnetta Hutching              This pt. needs to be scheduled with Dr. Donnetta Hutching for a consult appt. prior to ALIF scheduled 03/06/15 (Dr. Donnetta Hutching is opening for Dr. Vertell Limber)  Please remind the pt. to bring his LS spine films to the appt. with him.

## 2014-12-26 NOTE — Telephone Encounter (Signed)
Spoke with pt, dpm °

## 2014-12-29 ENCOUNTER — Other Ambulatory Visit: Payer: Medicare Other

## 2014-12-30 ENCOUNTER — Other Ambulatory Visit: Payer: Medicare Other

## 2014-12-30 ENCOUNTER — Other Ambulatory Visit: Payer: Self-pay | Admitting: Neurosurgery

## 2014-12-30 DIAGNOSIS — R7301 Impaired fasting glucose: Secondary | ICD-10-CM

## 2014-12-30 LAB — HEMOGLOBIN A1C
HEMOGLOBIN A1C: 6.4 % — AB (ref <5.7)
MEAN PLASMA GLUCOSE: 137 mg/dL — AB (ref <117)

## 2014-12-31 DIAGNOSIS — Z87442 Personal history of urinary calculi: Secondary | ICD-10-CM | POA: Diagnosis not present

## 2014-12-31 DIAGNOSIS — M545 Low back pain: Secondary | ICD-10-CM | POA: Diagnosis not present

## 2014-12-31 DIAGNOSIS — R319 Hematuria, unspecified: Secondary | ICD-10-CM | POA: Diagnosis not present

## 2015-01-19 ENCOUNTER — Encounter: Payer: Self-pay | Admitting: Vascular Surgery

## 2015-01-20 ENCOUNTER — Telehealth: Payer: Self-pay | Admitting: Vascular Surgery

## 2015-01-20 ENCOUNTER — Encounter: Payer: Self-pay | Admitting: Vascular Surgery

## 2015-01-20 NOTE — Telephone Encounter (Signed)
Spoke with Shanon Brow to re-appoint with [facility] in April. Patient verbalized understanding. dpm

## 2015-01-20 NOTE — Telephone Encounter (Signed)
-----   Message from Denman George, RN sent at 01/20/2015 11:04 AM EST ----- Regarding: need to cancel and reschedule office consult  Contact: [phone] This pt. has appt. with [facility] today at 1:00 PM.  Dr. Melven Sartorius scheduler has changed the date of the ALIF to 03/24/15.  Dr. Scot Dock will be assisting Dr. Vertell Limber, so needs a consult with [facility] prior to 5/10.  I left a voice message for the pt. that we will cancel today's appt. and reschedule with Dr. Scot Dock.  (I spoke with him earlier today, and let him know to contact Dr. Melven Sartorius office regarding the surgery date change, so the VM won't be a surprise to him.)

## 2015-01-21 ENCOUNTER — Other Ambulatory Visit: Payer: Self-pay

## 2015-01-28 DIAGNOSIS — D045 Carcinoma in situ of skin of trunk: Secondary | ICD-10-CM | POA: Diagnosis not present

## 2015-01-28 DIAGNOSIS — L720 Epidermal cyst: Secondary | ICD-10-CM | POA: Diagnosis not present

## 2015-01-28 DIAGNOSIS — D485 Neoplasm of uncertain behavior of skin: Secondary | ICD-10-CM | POA: Diagnosis not present

## 2015-01-30 ENCOUNTER — Encounter: Payer: Self-pay | Admitting: Family Medicine

## 2015-01-30 ENCOUNTER — Ambulatory Visit (INDEPENDENT_AMBULATORY_CARE_PROVIDER_SITE_OTHER): Payer: Medicare Other | Admitting: Family Medicine

## 2015-01-30 VITALS — BP 137/81 | HR 74 | Temp 98.5°F | Ht 68.0 in | Wt 172.0 lb

## 2015-01-30 DIAGNOSIS — H6123 Impacted cerumen, bilateral: Secondary | ICD-10-CM

## 2015-01-30 NOTE — Progress Notes (Signed)
OFFICE NOTE  01/30/2015  CC: "clean ears"  HPI: Patient is a 69 y.o. Caucasian male who is here for "clean ears". Ears feel full of wax.  No pain.  Hearing is diminished.  Has hx of cerumen build-up to the point of needing [place] to clear them with curette and/or lavage in the past.  Of note, has plans for extensive L-spine surgery this summer due to spinal stenosis.  Pertinent PMH:  Past medical, surgical, social, and family history reviewed and no changes are noted since last office visit.  MEDS:  Outpatient Prescriptions Prior to Visit  Medication Sig Dispense Refill  . cholecalciferol (VITAMIN D) 400 UNITS TABS Take by mouth.    Marland Kitchen CINNAMON PO Take 1 tablet by mouth daily.    Marland Kitchen co-enzyme Q-10 30 MG capsule Take 30 mg by mouth 3 (three) times daily.    Marland Kitchen HYDROcodone-acetaminophen (VICODIN) 5-500 MG per tablet as directed. For kidney stone    . Multiple Vitamin (MULITIVITAMIN WITH MINERALS) TABS Take 1 tablet by mouth daily.    Marland Kitchen OVER THE COUNTER MEDICATION Take 1 tablet by mouth daily. Vitamin K2    . calcium & magnesium carbonates (MYLANTA) 311-232 MG per tablet Take 1 tablet by mouth daily.    . diclofenac (CATAFLAM) 50 MG tablet     . OVER THE COUNTER MEDICATION Take 1 tablet by mouth 2 (two) times daily. Arthritis Eeze    . OVER THE COUNTER MEDICATION 1 capful by mouth once a day OPC antioxidant    . Pyridoxine HCl (VITAMIN B-6 PO) Take 1 tablet by mouth daily.    . Tamsulosin HCl (FLOMAX) 0.4 MG CAPS Take 1 tablet by mouth Daily.    Marland Kitchen VITAMIN E PO Take 1 tablet by mouth daily.     No facility-administered medications prior to visit.    PE: Blood pressure 137/81, pulse 74, temperature 98.5 F (36.9 C), temperature source Temporal, height 5\' 8"  (1.727 m), weight 172 lb (78.019 kg), SpO2 95 %. Gen: Alert, well appearing.  Patient is oriented to person, place, time, and situation. ENT: both EAC's with 100% cerumen impactions.  I was able to remove the impacted cerumen in it's  entirety on both sides by using an ear curette.  IMPRESSION AND PLAN:  Bilateral cerumen impactions. Extracted both sides with curette today.  Recommended trial of OTC generic debrox in each ear daily.  An After Visit Summary was printed and given to the patient.  FOLLOW UP: late this summer, a couple of months after his low back surgery--for fasting CPE

## 2015-01-30 NOTE — Patient Instructions (Signed)
Buy OTC generic drops called Debrox and put 3-4 drops in each ear once a day.

## 2015-01-30 NOTE — Progress Notes (Signed)
Pre visit review using our clinic review tool, if applicable. No additional management support is needed unless otherwise documented below in the visit note. 

## 2015-02-02 ENCOUNTER — Telehealth: Payer: Self-pay | Admitting: Family Medicine

## 2015-02-02 NOTE — Telephone Encounter (Signed)
emmi emailed °

## 2015-02-20 ENCOUNTER — Other Ambulatory Visit (HOSPITAL_COMMUNITY): Payer: BLUE CROSS/BLUE SHIELD

## 2015-02-25 DIAGNOSIS — D045 Carcinoma in situ of skin of trunk: Secondary | ICD-10-CM | POA: Diagnosis not present

## 2015-03-03 ENCOUNTER — Encounter: Payer: Self-pay | Admitting: Vascular Surgery

## 2015-03-04 ENCOUNTER — Encounter: Payer: Self-pay | Admitting: Vascular Surgery

## 2015-03-04 ENCOUNTER — Ambulatory Visit (INDEPENDENT_AMBULATORY_CARE_PROVIDER_SITE_OTHER): Payer: Medicare Other | Admitting: Vascular Surgery

## 2015-03-04 VITALS — BP 133/73 | HR 72 | Ht 68.0 in | Wt 174.0 lb

## 2015-03-04 DIAGNOSIS — M5136 Other intervertebral disc degeneration, lumbar region: Secondary | ICD-10-CM

## 2015-03-04 NOTE — Progress Notes (Signed)
[facility] [place]  Patient name: Ethan Rose MRN: [id number] DOB: 07/21/1946 Sex: male  REASON FOR CONSULT: Evaluate for anterior retroperitoneal exposure. Referred by Dr. Vertell Limber.  HPI: Ethan Rose is a 69 y.o. male with a long history of lower extremity weakness. His symptoms have been going on for about a year. He has significant spinal stenosis and scoliosis. He has tried physical therapy and injection therapy with no relief of his symptoms. His symptoms are aggravated by activity.  He is scheduled for anterior lumbar interbody fusion at the L5-S1 level and I have been asked to provide anterior retroperitoneal exposure for this. I believe he is subsequently point to have a right L2-L3, L3-L4, L4-L5, lateral lumbar interbody fusion.  He's had previous bilateral inguinal hernia repairs.  His only risk factor for peripheral vascular disease is a remote history of tobacco use. He denies any history of diabetes, hypertension, hypercholesterolemia, or history of premature cardiovascular disease.    Past Medical History  Diagnosis Date  . Kidney calculi     Multiple passed in the past.   . GERD (gastroesophageal reflux disease)   . Hay fever   . 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 Chronic low back pain   . Prediabetes 2015    HbA1c 6.4%: pt saw nutritionist   Family History  Problem Relation Age of Onset  . Stroke Mother   . Diabetes Mother   . Heart disease Mother   . Hypertension Mother   . Heart attack Mother   . Stroke Father   . Heart disease Father   . Diabetes Brother   . Hypertension Brother    SOCIAL HISTORY: History  Substance Use Topics  . Smoking status: Former Smoker -- 0.50 packs/day    Types: Cigarettes    Quit date: 03/03/2014  . Smokeless tobacco: Never Used  . Alcohol Use: 4.8 oz/week    8 Cans of beer per week   No Known Allergies Current Outpatient Prescriptions  Medication Sig Dispense Refill  . ACAI  BERRY PO Take by mouth.    . cholecalciferol (VITAMIN D) 400 UNITS TABS Take by mouth.    Marland Kitchen CINNAMON PO Take 1 tablet by mouth daily.    Marland Kitchen co-enzyme Q-10 30 MG capsule Take 30 mg by mouth 3 (three) times daily.    Marland Kitchen HYDROcodone-acetaminophen (VICODIN) 5-500 MG per tablet as directed. For kidney stone    . Multiple Vitamin (MULITIVITAMIN WITH MINERALS) TABS Take 1 tablet by mouth daily.    . Multiple Vitamins-Minerals (ZINC PO) Take by mouth daily. OTC    . NON FORMULARY daily. OTC Medication-CardioCover    . OVER THE COUNTER MEDICATION Take 1 tablet by mouth daily. Vitamin K2    . OVER THE COUNTER MEDICATION Take 1,000 mg by mouth daily. Curcumin- Turmeric capsules    . Probiotic Product (PROBIOTIC DAILY PO) Take by mouth.     No current facility-administered medications for this visit.   REVIEW OF SYSTEMS: Valu.Nieves ] denotes positive finding; [  ] denotes negative finding  CARDIOVASCULAR:  [ ]  chest pain   [ ]  chest pressure   [ ]  palpitations   [ ]  orthopnea   [ ]  dyspnea on exertion   [ ]  claudication   [ ]  rest pain   [ ]  DVT   [ ]  phlebitis PULMONARY:   [ ]  productive cough   [ ]  asthma   [ ]  wheezing NEUROLOGIC:   Valu.Nieves ]  weakness  [ ]  paresthesias  [ ]  aphasia  [ ]  amaurosis  [ ]  dizziness HEMATOLOGIC:   [ ]  bleeding problems   [ ]  clotting disorders MUSCULOSKELETAL:  [ ]  joint pain   [ ]  joint swelling Valu.Nieves ] leg swelling GASTROINTESTINAL: [ ]   blood in stool  [ ]   hematemesis GENITOURINARY:  [ ]   dysuria  [ ]   hematuria PSYCHIATRIC:  [ ]  history of major depression INTEGUMENTARY:  [ ]  rashes  [ ]  ulcers CONSTITUTIONAL:  [ ]  fever   [ ]  chills  PHYSICAL EXAM: Filed Vitals:   03/04/15 0909  BP: 148/81  Pulse: 72  Height: 5\' 8"  (1.727 m)  Weight: 174 lb (78.926 kg)  SpO2: 97%   GENERAL: The patient is a well-nourished male, in no acute distress. The vital signs are documented above. CARDIOVASCULAR: There is a regular rate and rhythm. I do not detect carotid bruits. He has palpable  posterior tibial pulses. PULMONARY: There is good air exchange bilaterally without wheezing or rales. ABDOMEN: Soft and non-tender with normal pitched bowel sounds.  MUSCULOSKELETAL: There are no major deformities or cyanosis. NEUROLOGIC: No focal weakness or paresthesias are detected. SKIN: There are no ulcers or rashes noted. PSYCHIATRIC: The patient has a normal affect.  DATA:  I have reviewed his MRI which was done on 12/31/2013. He has multilevel spondylosis. He has a biconvex scoliosis. There is multifactorial  Spinal stenosis at L3-L4 and L4-L5.  On his plain films I do not see significant calcific disease in his distal aorta and iliac arteries.  MEDICAL ISSUES: The patient appears to be a good candidate for anterior retroperitoneal exposure of L5-S1. I have reviewed our role in exposure of the spine in order to allow anterior lumbar interbody fusion at the appropriate levels. We have discussed the potential complications of surgery, including but not limited to, arterial or venous injury, thrombosis, or bleeding. We have also discussed the potential risks of wound healing problems, the development of a hernia, nerve injury, leg swelling, or other unpredictable medical problems. I've also explained that for the L5-S1 level there is a small risk of retrograde ejaculation. All the patient's questions were answered and they are agreeable to proceed. This surgery is scheduled for May 10.  [place] [facility] [place] Beeper: [phone]

## 2015-03-06 ENCOUNTER — Encounter (HOSPITAL_COMMUNITY): Admission: RE | Payer: Self-pay | Source: Ambulatory Visit

## 2015-03-06 ENCOUNTER — Inpatient Hospital Stay (HOSPITAL_COMMUNITY): Admission: RE | Admit: 2015-03-06 | Payer: Self-pay | Source: Ambulatory Visit | Admitting: Neurosurgery

## 2015-03-06 SURGERY — ANTERIOR LUMBAR FUSION 1 LEVEL
Anesthesia: General | Laterality: Right

## 2015-03-16 ENCOUNTER — Encounter (HOSPITAL_COMMUNITY): Payer: Self-pay

## 2015-03-16 ENCOUNTER — Encounter (HOSPITAL_COMMUNITY)
Admission: RE | Admit: 2015-03-16 | Discharge: 2015-03-16 | Disposition: A | Discharge status: Home / Self Care | Payer: Medicare Other | Source: Ambulatory Visit | Attending: Neurosurgery | Admitting: Neurosurgery

## 2015-03-16 ENCOUNTER — Other Ambulatory Visit: Payer: Self-pay

## 2015-03-16 DIAGNOSIS — Z01812 Encounter for preprocedural laboratory examination: Secondary | ICD-10-CM | POA: Insufficient documentation

## 2015-03-16 DIAGNOSIS — Z01810 Encounter for preprocedural cardiovascular examination: Secondary | ICD-10-CM | POA: Insufficient documentation

## 2015-03-16 DIAGNOSIS — I10 Essential (primary) hypertension: Secondary | ICD-10-CM | POA: Diagnosis not present

## 2015-03-16 LAB — CBC
HCT: 45.3 % (ref 39.0–52.0)
Hemoglobin: 15.1 g/dL (ref 13.0–17.0)
MCH: 30.3 pg (ref 26.0–34.0)
MCHC: 33.3 g/dL (ref 30.0–36.0)
MCV: 91 fL (ref 78.0–100.0)
Platelets: 240 10*3/uL (ref 150–400)
RBC: 4.98 MIL/uL (ref 4.22–5.81)
RDW: 13.2 % (ref 11.5–15.5)
WBC: 8.5 10*3/uL (ref 4.0–10.5)

## 2015-03-16 LAB — ABO/RH: ABO/RH(D): O POS

## 2015-03-16 LAB — BASIC METABOLIC PANEL
ANION GAP: 10 (ref 5–15)
BUN: 13 mg/dL (ref 6–20)
CO2: 26 mmol/L (ref 22–32)
CREATININE: 0.49 mg/dL — AB (ref 0.61–1.24)
Calcium: 9.4 mg/dL (ref 8.9–10.3)
Chloride: 103 mmol/L (ref 101–111)
Glucose, Bld: 96 mg/dL (ref 70–99)
Potassium: 4.4 mmol/L (ref 3.5–5.1)
Sodium: 139 mmol/L (ref 135–145)

## 2015-03-16 LAB — SURGICAL PCR SCREEN
MRSA, PCR: NEGATIVE
Staphylococcus aureus: NEGATIVE

## 2015-03-16 LAB — TYPE AND SCREEN
ABO/RH(D): O POS
Antibody Screen: NEGATIVE

## 2015-03-16 NOTE — Progress Notes (Signed)
Pt.  Followed by Dr. Chales Abrahams in [place], pt. Doesn't remember last ekg, states he hasn't ever been seen by cardiac, admits that he has a feeling now & then in his chest that is uncomfortable but assumes its indigestion, states that he takes Papaya tablets & it always resolves.

## 2015-03-16 NOTE — Progress Notes (Signed)
Call to pharm. Tech for med. Rec. Completion

## 2015-03-16 NOTE — Progress Notes (Signed)
Left a voicemail at [facility] to notify them that Dr. Nicole Cella order for OR consent is still needed in EPIC orders.

## 2015-03-16 NOTE — Progress Notes (Signed)
Call to [facility], for orders from Dr. Scot Dock, on answering service,.

## 2015-03-16 NOTE — Pre-Procedure Instructions (Signed)
Ethan Rose  03/16/2015   Your procedure is scheduled on:  03/24/2015  Report to [facility] Admitting Entrance A  at 5:30 AM.  Call this number if you have problems the morning of surgery: [phone]   Remember:   Do not eat food or drink liquids after midnight.  On Monday NIGHT   Take these medicines the morning of surgery with A SIP OF WATER: nothing    Do not wear jewelry   Do not wear lotions, powders, or perfumes. You may wear deodorant.    Men may shave face and neck.   Do not bring valuables to the hospital.  [facility] is not responsible                  for any belongings or valuables.               Contacts, dentures or bridgework may not be worn into surgery.   Leave suitcase in the car. After surgery it may be brought to your room.   For patients admitted to the hospital, discharge time is determined by your                treatment team.               Patients discharged the day of surgery will not be allowed to drive  home.  Name and phone number of your driver: with family  Special Instructions: Special Instructions: [place] - Preparing for Surgery  Before surgery, you can play an important role.  Because skin is not sterile, your skin needs to be as free of germs as possible.  You can reduce the number of germs on you skin by washing with CHG (chlorahexidine gluconate) soap before surgery.  CHG is an antiseptic cleaner which kills germs and bonds with the skin to continue killing germs even after washing.  Please DO NOT use if you have an allergy to CHG or antibacterial soaps.  If your skin becomes reddened/irritated stop using the CHG and inform your nurse when you arrive at Short Stay.  Do not shave (including legs and underarms) for at least 48 hours prior to the first CHG shower.  You may shave your face.  Please follow these instructions carefully:   1.  Shower with CHG Soap the night before surgery and the  morning of Surgery.  2.  If  you choose to wash your hair, wash your hair first as usual with your  normal shampoo.  3.  After you shampoo, rinse your hair and body thoroughly to remove the  Shampoo.  4.  Use CHG as you would any other liquid soap.  You can apply chg directly to the skin and wash gently with scrungie or a clean washcloth.  5.  Apply the CHG Soap to your body ONLY FROM THE NECK DOWN.    Do not use on open wounds or open sores.  Avoid contact with your eyes, ears, mouth and genitals (private parts).  Wash genitals (private parts)   with your normal soap.  6.  Wash thoroughly, paying special attention to the area where your surgery will be performed.  7.  Thoroughly rinse your body with warm water from the neck down.  8.  DO NOT shower/wash with your normal soap after using and rinsing off   the CHG Soap.  9.  Pat yourself dry with a clean towel.  10.  Wear clean pajamas.            11.  Place clean sheets on your bed the night of your first shower and do not sleep with pets.  Day of Surgery  Do not apply any lotions/deodorants the morning of surgery.  Please wear clean clothes to the hospital/surgery center.   Please read over the following fact sheets that you were given: Pain Booklet, Coughing and Deep Breathing, Blood Transfusion Information, MRSA Information and Surgical Site Infection Prevention

## 2015-03-18 NOTE — Progress Notes (Addendum)
Anesthesia Chart Review:  Pt is 69 year old male scheduled for L5-S1 ALIF, R L2-3, L3-4, L4-5 ALIF on 03/24/2015 with Dr. Vertell Limber and Dr. Scot Dock  PMH includes: prediabetes, GERD. Former smoker. BMI 27  Preoperative labs reviewed.    EKG: sinus bradycardia (58 bpm). Anterior infarct, age undetermined.   Pt reported to PAT RN having occasional chest discomfort that he believes is indigestion.   Discussed with Dr. Tobias Alexander. Pt will need cardiac clearance prior to surgery. Left voicemail for Janett Billow in Dr. Melven Sartorius office.   Willeen Cass, FNP-BC [facility] Short Stay Surgical Center/Anesthesiology Phone: [phone] 03/18/2015 4:26 PM  Addendum: Pt saw Dr. Dennison Nancy at [facility] in [place] on 03/19/2015. No testing was done. Pt is cleared for surgery "at low cardiac risk for a moderate risk procedure." See Dr. Karma Greaser note in [place].   Willeen Cass, FNP-BC [facility] Short Stay Surgical Center/Anesthesiology Phone: [phone] 03/20/2015 5:10 PM

## 2015-03-19 ENCOUNTER — Telehealth: Payer: Self-pay | Admitting: *Deleted

## 2015-03-19 DIAGNOSIS — R5383 Other fatigue: Secondary | ICD-10-CM | POA: Diagnosis not present

## 2015-03-19 DIAGNOSIS — K30 Functional dyspepsia: Secondary | ICD-10-CM | POA: Diagnosis not present

## 2015-03-19 NOTE — Telephone Encounter (Signed)
I recommend [facility] physical therapy.

## 2015-03-19 NOTE — Telephone Encounter (Signed)
Pt LMOM stating that he is scheduled to have back surgery on 03/24/15 and was asked where he would like to go for rehab after surgery. He wants to know if there are any Rehab facilities that Dr. Anitra Lauth would recommend.

## 2015-03-19 NOTE — Telephone Encounter (Signed)
Called and informed patient. He is actually going to be staying somewhere until he is ready to go home (like staying over night). Dr. Vertell Limber(?) recommend [facility] and he is going to look at it tomorrow.

## 2015-03-20 ENCOUNTER — Ambulatory Visit: Payer: BLUE CROSS/BLUE SHIELD | Admitting: Cardiovascular Disease

## 2015-03-23 MED ORDER — CEFAZOLIN SODIUM-DEXTROSE 2-3 GM-% IV SOLR
2.0000 g | INTRAVENOUS | Status: AC
  Administered 2015-03-24 (×2): 2 g via INTRAVENOUS

## 2015-03-24 ENCOUNTER — Encounter (HOSPITAL_COMMUNITY): Payer: Self-pay | Admitting: *Deleted

## 2015-03-24 ENCOUNTER — Inpatient Hospital Stay (HOSPITAL_COMMUNITY): Payer: Medicare Other | Admitting: Emergency Medicine

## 2015-03-24 ENCOUNTER — Encounter (HOSPITAL_COMMUNITY)
Admission: RE | Disposition: A | Discharge status: Skilled Nursing Facility | Payer: Medicare Other | Source: Ambulatory Visit | Attending: Neurosurgery

## 2015-03-24 ENCOUNTER — Inpatient Hospital Stay (HOSPITAL_COMMUNITY): Payer: Medicare Other | Admitting: Anesthesiology

## 2015-03-24 ENCOUNTER — Inpatient Hospital Stay (HOSPITAL_COMMUNITY): Payer: Medicare Other

## 2015-03-24 ENCOUNTER — Inpatient Hospital Stay (HOSPITAL_COMMUNITY)
Admission: RE | Admit: 2015-03-24 | Discharge: 2015-04-01 | DRG: 454 | Disposition: A | Discharge status: Skilled Nursing Facility | Payer: Medicare Other | Source: Ambulatory Visit | Attending: Neurosurgery | Admitting: Neurosurgery

## 2015-03-24 DIAGNOSIS — M545 Low back pain: Secondary | ICD-10-CM | POA: Diagnosis not present

## 2015-03-24 DIAGNOSIS — R2681 Unsteadiness on feet: Secondary | ICD-10-CM | POA: Diagnosis not present

## 2015-03-24 DIAGNOSIS — K913 Postprocedural intestinal obstruction: Secondary | ICD-10-CM | POA: Diagnosis not present

## 2015-03-24 DIAGNOSIS — Z09 Encounter for follow-up examination after completed treatment for conditions other than malignant neoplasm: Secondary | ICD-10-CM

## 2015-03-24 DIAGNOSIS — M48061 Spinal stenosis, lumbar region without neurogenic claudication: Secondary | ICD-10-CM

## 2015-03-24 DIAGNOSIS — M21372 Foot drop, left foot: Secondary | ICD-10-CM | POA: Diagnosis present

## 2015-03-24 DIAGNOSIS — Z720 Tobacco use: Secondary | ICD-10-CM

## 2015-03-24 DIAGNOSIS — M5416 Radiculopathy, lumbar region: Secondary | ICD-10-CM | POA: Diagnosis present

## 2015-03-24 DIAGNOSIS — R278 Other lack of coordination: Secondary | ICD-10-CM | POA: Diagnosis not present

## 2015-03-24 DIAGNOSIS — Q676 Pectus excavatum: Secondary | ICD-10-CM

## 2015-03-24 DIAGNOSIS — M4320 Fusion of spine, site unspecified: Secondary | ICD-10-CM | POA: Diagnosis not present

## 2015-03-24 DIAGNOSIS — M4317 Spondylolisthesis, lumbosacral region: Secondary | ICD-10-CM | POA: Diagnosis present

## 2015-03-24 DIAGNOSIS — M41126 Adolescent idiopathic scoliosis, lumbar region: Secondary | ICD-10-CM | POA: Diagnosis not present

## 2015-03-24 DIAGNOSIS — M2578 Osteophyte, vertebrae: Secondary | ICD-10-CM | POA: Diagnosis present

## 2015-03-24 DIAGNOSIS — Z981 Arthrodesis status: Secondary | ICD-10-CM | POA: Diagnosis not present

## 2015-03-24 DIAGNOSIS — M4155 Other secondary scoliosis, thoracolumbar region: Secondary | ICD-10-CM | POA: Diagnosis present

## 2015-03-24 DIAGNOSIS — I10 Essential (primary) hypertension: Secondary | ICD-10-CM | POA: Diagnosis present

## 2015-03-24 DIAGNOSIS — M4125 Other idiopathic scoliosis, thoracolumbar region: Secondary | ICD-10-CM | POA: Diagnosis present

## 2015-03-24 DIAGNOSIS — K219 Gastro-esophageal reflux disease without esophagitis: Secondary | ICD-10-CM | POA: Diagnosis present

## 2015-03-24 DIAGNOSIS — M419 Scoliosis, unspecified: Secondary | ICD-10-CM | POA: Diagnosis present

## 2015-03-24 DIAGNOSIS — M549 Dorsalgia, unspecified: Secondary | ICD-10-CM | POA: Diagnosis not present

## 2015-03-24 DIAGNOSIS — M40205 Unspecified kyphosis, thoracolumbar region: Secondary | ICD-10-CM | POA: Diagnosis not present

## 2015-03-24 DIAGNOSIS — M6281 Muscle weakness (generalized): Secondary | ICD-10-CM | POA: Diagnosis not present

## 2015-03-24 DIAGNOSIS — M479 Spondylosis, unspecified: Secondary | ICD-10-CM | POA: Diagnosis present

## 2015-03-24 DIAGNOSIS — M4126 Other idiopathic scoliosis, lumbar region: Principal | ICD-10-CM | POA: Diagnosis present

## 2015-03-24 DIAGNOSIS — M4185 Other forms of scoliosis, thoracolumbar region: Secondary | ICD-10-CM | POA: Diagnosis not present

## 2015-03-24 DIAGNOSIS — M4316 Spondylolisthesis, lumbar region: Secondary | ICD-10-CM | POA: Diagnosis not present

## 2015-03-24 DIAGNOSIS — M4326 Fusion of spine, lumbar region: Secondary | ICD-10-CM | POA: Diagnosis not present

## 2015-03-24 DIAGNOSIS — M4186 Other forms of scoliosis, lumbar region: Secondary | ICD-10-CM | POA: Diagnosis not present

## 2015-03-24 DIAGNOSIS — M4120 Other idiopathic scoliosis, site unspecified: Secondary | ICD-10-CM | POA: Diagnosis not present

## 2015-03-24 DIAGNOSIS — M47816 Spondylosis without myelopathy or radiculopathy, lumbar region: Secondary | ICD-10-CM | POA: Diagnosis not present

## 2015-03-24 DIAGNOSIS — M5136 Other intervertebral disc degeneration, lumbar region: Secondary | ICD-10-CM | POA: Diagnosis not present

## 2015-03-24 HISTORY — PX: ANTERIOR LAT LUMBAR FUSION: SHX1168

## 2015-03-24 HISTORY — PX: ABDOMINAL EXPOSURE: SHX5708

## 2015-03-24 LAB — GLUCOSE, CAPILLARY
GLUCOSE-CAPILLARY: 155 mg/dL — AB (ref 70–99)
Glucose-Capillary: 128 mg/dL — ABNORMAL HIGH (ref 70–99)

## 2015-03-24 SURGERY — ANTERIOR LATERAL LUMBAR FUSION 3 LEVELS
Anesthesia: General | Site: Spine Lumbar | Laterality: Right

## 2015-03-24 MED ORDER — HYDROCODONE-ACETAMINOPHEN 5-325 MG PO TABS
1.0000 | ORAL_TABLET | ORAL | Status: DC | PRN
  Administered 2015-03-26 (×2): 2 via ORAL
  Filled 2015-03-24 (×2): qty 2

## 2015-03-24 MED ORDER — FENTANYL CITRATE (PF) 100 MCG/2ML IJ SOLN
INTRAMUSCULAR | Status: DC | PRN
  Administered 2015-03-24 (×3): 50 ug via INTRAVENOUS
  Administered 2015-03-24: 100 ug via INTRAVENOUS
  Administered 2015-03-24 (×2): 50 ug via INTRAVENOUS

## 2015-03-24 MED ORDER — GLYCOPYRROLATE 0.2 MG/ML IJ SOLN
INTRAMUSCULAR | Status: DC | PRN
  Administered 2015-03-24: 0.4 mg via INTRAVENOUS

## 2015-03-24 MED ORDER — PHENOL 1.4 % MT LIQD: 1.0000 | OROMUCOSAL | Status: DC | PRN

## 2015-03-24 MED ORDER — ONDANSETRON HCL 4 MG/2ML IJ SOLN
4.0000 mg | INTRAMUSCULAR | Status: DC | PRN
  Administered 2015-03-24 – 2015-03-27 (×2): 4 mg via INTRAVENOUS
  Filled 2015-03-24 (×3): qty 2

## 2015-03-24 MED ORDER — DOCUSATE SODIUM 100 MG PO CAPS
100.0000 mg | ORAL_CAPSULE | Freq: Two times a day (BID) | ORAL | Status: DC
  Administered 2015-03-24 – 2015-03-26 (×5): 100 mg via ORAL
  Filled 2015-03-24 (×7): qty 1

## 2015-03-24 MED ORDER — EPHEDRINE SULFATE 50 MG/ML IJ SOLN
INTRAMUSCULAR | Status: AC
  Filled 2015-03-24: qty 1

## 2015-03-24 MED ORDER — FENTANYL CITRATE (PF) 250 MCG/5ML IJ SOLN
INTRAMUSCULAR | Status: AC
  Filled 2015-03-24: qty 5

## 2015-03-24 MED ORDER — MENTHOL 3 MG MT LOZG: 1.0000 | LOZENGE | OROMUCOSAL | Status: DC | PRN

## 2015-03-24 MED ORDER — STERILE WATER FOR INJECTION IJ SOLN
INTRAMUSCULAR | Status: AC
  Filled 2015-03-24: qty 10

## 2015-03-24 MED ORDER — ZOLPIDEM TARTRATE 5 MG PO TABS: 5.0000 mg | ORAL_TABLET | Freq: Every evening | ORAL | Status: DC | PRN

## 2015-03-24 MED ORDER — PROPOFOL INFUSION 10 MG/ML OPTIME
INTRAVENOUS | Status: DC | PRN
  Administered 2015-03-24: 75 ug/kg/min via INTRAVENOUS

## 2015-03-24 MED ORDER — ACETAMINOPHEN 10 MG/ML IV SOLN
INTRAVENOUS | Status: AC
  Administered 2015-03-24: 1000 mg via INTRAVENOUS
  Filled 2015-03-24: qty 100

## 2015-03-24 MED ORDER — ADULT MULTIVITAMIN W/MINERALS CH
1.0000 | ORAL_TABLET | Freq: Every day | ORAL | Status: DC
  Administered 2015-03-24 – 2015-04-01 (×6): 1 via ORAL
  Filled 2015-03-24 (×7): qty 1

## 2015-03-24 MED ORDER — CHOLECALCIFEROL 125 MCG (5000 UT) PO CAPS: 5000.0000 [IU] | ORAL_CAPSULE | Freq: Every day | ORAL | Status: DC

## 2015-03-24 MED ORDER — PHENYLEPHRINE HCL 10 MG/ML IJ SOLN
10.0000 mg | INTRAVENOUS | Status: DC | PRN
  Administered 2015-03-24: 20 ug/min via INTRAVENOUS

## 2015-03-24 MED ORDER — MIDAZOLAM HCL 2 MG/2ML IJ SOLN
INTRAMUSCULAR | Status: AC
  Filled 2015-03-24: qty 2

## 2015-03-24 MED ORDER — ACETAMINOPHEN 325 MG PO TABS: 650.0000 mg | ORAL_TABLET | ORAL | Status: DC | PRN

## 2015-03-24 MED ORDER — CHLORHEXIDINE GLUCONATE 4 % EX LIQD
60.0000 mL | Freq: Once | CUTANEOUS | Status: DC
  Filled 2015-03-24: qty 60

## 2015-03-24 MED ORDER — SODIUM CHLORIDE 0.9 % IJ SOLN: 3.0000 mL | INTRAMUSCULAR | Status: DC | PRN

## 2015-03-24 MED ORDER — KCL IN DEXTROSE-NACL 20-5-0.45 MEQ/L-%-% IV SOLN
INTRAVENOUS | Status: DC
  Administered 2015-03-24: 75 mL/h via INTRAVENOUS
  Filled 2015-03-24: qty 1000

## 2015-03-24 MED ORDER — ACETAMINOPHEN 650 MG RE SUPP: 650.0000 mg | RECTAL | Status: DC | PRN

## 2015-03-24 MED ORDER — HYDROMORPHONE HCL 1 MG/ML IJ SOLN
INTRAMUSCULAR | Status: AC
  Filled 2015-03-24: qty 1

## 2015-03-24 MED ORDER — LIDOCAINE HCL (CARDIAC) 20 MG/ML IV SOLN
INTRAVENOUS | Status: DC | PRN
  Administered 2015-03-24: 80 mg via INTRAVENOUS

## 2015-03-24 MED ORDER — 0.9 % SODIUM CHLORIDE (POUR BTL) OPTIME
TOPICAL | Status: DC | PRN
  Administered 2015-03-24: 1000 mL

## 2015-03-24 MED ORDER — HYDROCODONE-ACETAMINOPHEN 10-325 MG PO TABS: 1.0000 | ORAL_TABLET | ORAL | Status: DC | PRN

## 2015-03-24 MED ORDER — OXYCODONE-ACETAMINOPHEN 5-325 MG PO TABS
1.0000 | ORAL_TABLET | ORAL | Status: DC | PRN
  Administered 2015-03-25: 1 via ORAL
  Administered 2015-03-25 (×3): 2 via ORAL
  Filled 2015-03-24 (×4): qty 2

## 2015-03-24 MED ORDER — POLYETHYLENE GLYCOL 3350 17 G PO PACK
17.0000 g | PACK | Freq: Every day | ORAL | Status: DC | PRN
  Administered 2015-03-26: 17 g via ORAL
  Filled 2015-03-24: qty 1

## 2015-03-24 MED ORDER — THROMBIN 5000 UNITS EX SOLR
CUTANEOUS | Status: DC | PRN
  Administered 2015-03-24 (×2): 5000 [IU] via TOPICAL

## 2015-03-24 MED ORDER — EPHEDRINE SULFATE 50 MG/ML IJ SOLN
INTRAMUSCULAR | Status: DC | PRN
  Administered 2015-03-24 (×4): 5 mg via INTRAVENOUS

## 2015-03-24 MED ORDER — NEOSTIGMINE METHYLSULFATE 10 MG/10ML IV SOLN
INTRAVENOUS | Status: DC | PRN
Start: 2015-03-24 — End: 2015-03-24
  Administered 2015-03-24: 3 mg via INTRAVENOUS

## 2015-03-24 MED ORDER — NEOSTIGMINE METHYLSULFATE 10 MG/10ML IV SOLN
INTRAVENOUS | Status: AC
  Filled 2015-03-24: qty 1

## 2015-03-24 MED ORDER — HYDROMORPHONE HCL 1 MG/ML IJ SOLN
0.2500 mg | INTRAMUSCULAR | Status: DC | PRN
  Administered 2015-03-24 (×3): 0.5 mg via INTRAVENOUS

## 2015-03-24 MED ORDER — GLYCOPYRROLATE 0.2 MG/ML IJ SOLN
INTRAMUSCULAR | Status: AC
  Filled 2015-03-24: qty 2

## 2015-03-24 MED ORDER — PROPOFOL 10 MG/ML IV BOLUS
INTRAVENOUS | Status: AC
  Filled 2015-03-24: qty 20

## 2015-03-24 MED ORDER — CEFAZOLIN SODIUM-DEXTROSE 2-3 GM-% IV SOLR
INTRAVENOUS | Status: AC
  Filled 2015-03-24: qty 50

## 2015-03-24 MED ORDER — VECURONIUM BROMIDE 10 MG IV SOLR
INTRAVENOUS | Status: DC | PRN
  Administered 2015-03-24: 3 mg via INTRAVENOUS
  Administered 2015-03-24: 5 mg via INTRAVENOUS

## 2015-03-24 MED ORDER — GLYCOPYRROLATE 0.2 MG/ML IJ SOLN
INTRAMUSCULAR | Status: AC
  Filled 2015-03-24: qty 1

## 2015-03-24 MED ORDER — HYDROMORPHONE HCL 1 MG/ML IJ SOLN
0.5000 mg | INTRAMUSCULAR | Status: DC | PRN
  Administered 2015-03-24 – 2015-03-27 (×3): 1 mg via INTRAVENOUS
  Filled 2015-03-24 (×7): qty 1

## 2015-03-24 MED ORDER — OXYCODONE HCL 5 MG/5ML PO SOLN: 5.0000 mg | Freq: Once | ORAL | Status: DC | PRN

## 2015-03-24 MED ORDER — ONDANSETRON HCL 4 MG/2ML IJ SOLN
INTRAMUSCULAR | Status: AC
  Filled 2015-03-24: qty 2

## 2015-03-24 MED ORDER — PANTOPRAZOLE SODIUM 40 MG IV SOLR
40.0000 mg | Freq: Every day | INTRAVENOUS | Status: DC
  Administered 2015-03-24: 40 mg via INTRAVENOUS
  Filled 2015-03-24: qty 40

## 2015-03-24 MED ORDER — CEFAZOLIN SODIUM-DEXTROSE 2-3 GM-% IV SOLR
2.0000 g | Freq: Three times a day (TID) | INTRAVENOUS | Status: AC
  Administered 2015-03-24 – 2015-03-25 (×2): 2 g via INTRAVENOUS
  Filled 2015-03-24 (×2): qty 50

## 2015-03-24 MED ORDER — OXYCODONE HCL 5 MG PO TABS: 5.0000 mg | ORAL_TABLET | Freq: Once | ORAL | Status: DC | PRN

## 2015-03-24 MED ORDER — LIDOCAINE HCL (CARDIAC) 20 MG/ML IV SOLN
INTRAVENOUS | Status: AC
  Filled 2015-03-24: qty 5

## 2015-03-24 MED ORDER — LACTATED RINGERS IV SOLN
INTRAVENOUS | Status: DC | PRN
  Administered 2015-03-24: 07:00:00 via INTRAVENOUS

## 2015-03-24 MED ORDER — MIDAZOLAM HCL 5 MG/5ML IJ SOLN
INTRAMUSCULAR | Status: DC | PRN
  Administered 2015-03-24: 2 mg via INTRAVENOUS

## 2015-03-24 MED ORDER — SODIUM CHLORIDE 0.9 % IJ SOLN
INTRAMUSCULAR | Status: AC
  Filled 2015-03-24: qty 10

## 2015-03-24 MED ORDER — BUPIVACAINE HCL (PF) 0.5 % IJ SOLN
INTRAMUSCULAR | Status: DC | PRN
  Administered 2015-03-24: 5 mL

## 2015-03-24 MED ORDER — FLEET ENEMA 7-19 GM/118ML RE ENEM: 1.0000 | ENEMA | Freq: Once | RECTAL | Status: AC | PRN

## 2015-03-24 MED ORDER — LIDOCAINE-EPINEPHRINE 1 %-1:100000 IJ SOLN
INTRAMUSCULAR | Status: DC | PRN
  Administered 2015-03-24: 5 mL

## 2015-03-24 MED ORDER — SUCCINYLCHOLINE CHLORIDE 20 MG/ML IJ SOLN
INTRAMUSCULAR | Status: AC
  Filled 2015-03-24: qty 1

## 2015-03-24 MED ORDER — VECURONIUM BROMIDE 10 MG IV SOLR
INTRAVENOUS | Status: AC
  Filled 2015-03-24: qty 10

## 2015-03-24 MED ORDER — ROCURONIUM BROMIDE 100 MG/10ML IV SOLN
INTRAVENOUS | Status: DC | PRN
  Administered 2015-03-24: 50 mg via INTRAVENOUS

## 2015-03-24 MED ORDER — BISACODYL 10 MG RE SUPP: 10.0000 mg | Freq: Every day | RECTAL | Status: DC | PRN

## 2015-03-24 MED ORDER — DEXTROSE 5 % IV SOLN
500.0000 mg | Freq: Four times a day (QID) | INTRAVENOUS | Status: DC | PRN
  Administered 2015-03-30: 500 mg via INTRAVENOUS
  Filled 2015-03-24 (×2): qty 5

## 2015-03-24 MED ORDER — ROCURONIUM BROMIDE 50 MG/5ML IV SOLN
INTRAVENOUS | Status: AC
  Filled 2015-03-24: qty 1

## 2015-03-24 MED ORDER — LORATADINE 10 MG PO TABS: 10.0000 mg | ORAL_TABLET | Freq: Every day | ORAL | Status: DC | PRN

## 2015-03-24 MED ORDER — PHENYLEPHRINE 40 MCG/ML (10ML) SYRINGE FOR IV PUSH (FOR BLOOD PRESSURE SUPPORT)
PREFILLED_SYRINGE | INTRAVENOUS | Status: AC
  Filled 2015-03-24: qty 10

## 2015-03-24 MED ORDER — VITAMIN D 1000 UNITS PO TABS
5000.0000 [IU] | ORAL_TABLET | Freq: Every day | ORAL | Status: DC
  Administered 2015-03-24 – 2015-04-01 (×6): 5000 [IU] via ORAL
  Filled 2015-03-24 (×7): qty 5

## 2015-03-24 MED ORDER — PROPOFOL 10 MG/ML IV BOLUS
INTRAVENOUS | Status: DC | PRN
  Administered 2015-03-24: 180 mg via INTRAVENOUS
  Administered 2015-03-24: 20 mg via INTRAVENOUS

## 2015-03-24 MED ORDER — DEXAMETHASONE SODIUM PHOSPHATE 10 MG/ML IJ SOLN
INTRAMUSCULAR | Status: AC
  Filled 2015-03-24: qty 1

## 2015-03-24 MED ORDER — HEMOSTATIC AGENTS (NO CHARGE) OPTIME
TOPICAL | Status: DC | PRN
  Administered 2015-03-24: 1 via TOPICAL

## 2015-03-24 MED ORDER — SODIUM CHLORIDE 0.9 % IV SOLN: 250.0000 mL | INTRAVENOUS | Status: DC

## 2015-03-24 MED ORDER — LACTATED RINGERS IV SOLN
INTRAVENOUS | Status: DC | PRN
  Administered 2015-03-24 (×2): via INTRAVENOUS

## 2015-03-24 MED ORDER — ALUM & MAG HYDROXIDE-SIMETH 200-200-20 MG/5ML PO SUSP: 30.0000 mL | Freq: Four times a day (QID) | ORAL | Status: DC | PRN

## 2015-03-24 MED ORDER — ONDANSETRON HCL 4 MG/2ML IJ SOLN
INTRAMUSCULAR | Status: DC | PRN
  Administered 2015-03-24: 4 mg via INTRAVENOUS

## 2015-03-24 MED ORDER — METHOCARBAMOL 500 MG PO TABS
500.0000 mg | ORAL_TABLET | Freq: Four times a day (QID) | ORAL | Status: DC | PRN
  Administered 2015-03-25 – 2015-04-01 (×7): 500 mg via ORAL
  Filled 2015-03-24 (×8): qty 1

## 2015-03-24 MED ORDER — SODIUM CHLORIDE 0.9 % IJ SOLN
3.0000 mL | Freq: Two times a day (BID) | INTRAMUSCULAR | Status: DC
  Administered 2015-03-24 – 2015-03-27 (×4): 3 mL via INTRAVENOUS

## 2015-03-24 MED ORDER — ONDANSETRON HCL 4 MG/2ML IJ SOLN: 4.0000 mg | Freq: Four times a day (QID) | INTRAMUSCULAR | Status: DC | PRN

## 2015-03-24 MED ORDER — DEXAMETHASONE SODIUM PHOSPHATE 10 MG/ML IJ SOLN
INTRAMUSCULAR | Status: DC | PRN
  Administered 2015-03-24: 10 mg via INTRAVENOUS

## 2015-03-24 SURGICAL SUPPLY — 122 items
ADH SKN CLS APL DERMABOND .7 (GAUZE/BANDAGES/DRESSINGS)
ADH SKN CLS LQ APL DERMABOND (GAUZE/BANDAGES/DRESSINGS) ×9
APPLIER CLIP 11 MED OPEN (CLIP) ×5
APR CLP MED 11 20 MLT OPN (CLIP) ×3
BLADE CLIPPER SURG (BLADE) ×6 IMPLANT
BUR BARREL STRAIGHT FLUTE 4.0 (BURR) ×3 IMPLANT
CANISTER SUCT 3000ML PPV (MISCELLANEOUS) ×5 IMPLANT
CLIP APPLIE 11 MED OPEN (CLIP) ×5 IMPLANT
CONT SPEC 4OZ CLIKSEAL STRL BL (MISCELLANEOUS) ×5 IMPLANT
CORENT WIDE 10X22X55 (Orthopedic Implant) ×10 IMPLANT
COROENT STNADARD 10X22X55 (Orthopedic Implant) ×3 IMPLANT
COROENT WIDE 10X22X55 (Orthopedic Implant) ×2 IMPLANT
COVER BACK TABLE 24X17X13 BIG (DRAPES) IMPLANT
COVER BACK TABLE 60X90IN (DRAPES) ×2 IMPLANT
DECANTER SPIKE VIAL GLASS SM (MISCELLANEOUS) ×7 IMPLANT
DERMABOND ADHESIVE PROPEN (GAUZE/BANDAGES/DRESSINGS) ×6
DERMABOND ADVANCED (GAUZE/BANDAGES/DRESSINGS)
DERMABOND ADVANCED .7 DNX12 (GAUZE/BANDAGES/DRESSINGS) ×6 IMPLANT
DERMABOND ADVANCED .7 DNX6 (GAUZE/BANDAGES/DRESSINGS) ×3 IMPLANT
DRAPE C-ARM 42X72 X-RAY (DRAPES) ×15 IMPLANT
DRAPE C-ARMOR (DRAPES) ×5 IMPLANT
DRAPE INCISE IOBAN 66X45 STRL (DRAPES) ×2 IMPLANT
DRAPE LAPAROTOMY 100X72X124 (DRAPES) ×10 IMPLANT
DRAPE POUCH INSTRU U-SHP 10X18 (DRAPES) ×10 IMPLANT
DRSG OPSITE POSTOP 3X4 (GAUZE/BANDAGES/DRESSINGS) ×3 IMPLANT
DRSG OPSITE POSTOP 4X6 (GAUZE/BANDAGES/DRESSINGS) ×3 IMPLANT
DRSG TELFA 3X8 NADH (GAUZE/BANDAGES/DRESSINGS) IMPLANT
DURAPREP 26ML APPLICATOR (WOUND CARE) ×10 IMPLANT
ELECT BLADE 4.0 EZ CLEAN MEGAD (MISCELLANEOUS) ×5
ELECT REM PT RETURN 9FT ADLT (ELECTROSURGICAL) ×10
ELECTRODE BLDE 4.0 EZ CLN MEGD (MISCELLANEOUS) ×3 IMPLANT
ELECTRODE REM PT RTRN 9FT ADLT (ELECTROSURGICAL) ×6 IMPLANT
FELT TEFLON 6X6 (MISCELLANEOUS) IMPLANT
GAUZE SPONGE 4X4 12PLY STRL (GAUZE/BANDAGES/DRESSINGS) IMPLANT
GAUZE SPONGE 4X4 16PLY XRAY LF (GAUZE/BANDAGES/DRESSINGS) IMPLANT
GLOVE BIO SURGEON STRL SZ 6.5 (GLOVE) ×2 IMPLANT
GLOVE BIO SURGEON STRL SZ7 (GLOVE) ×3 IMPLANT
GLOVE BIO SURGEON STRL SZ8 (GLOVE) ×10 IMPLANT
GLOVE BIO SURGEONS STRL SZ 6.5 (GLOVE) ×1
GLOVE BIOGEL PI IND STRL 6.5 (GLOVE) ×1 IMPLANT
GLOVE BIOGEL PI IND STRL 7.5 (GLOVE) ×3 IMPLANT
GLOVE BIOGEL PI IND STRL 8 (GLOVE) ×9 IMPLANT
GLOVE BIOGEL PI IND STRL 8.5 (GLOVE) ×7 IMPLANT
GLOVE BIOGEL PI INDICATOR 6.5 (GLOVE) ×2
GLOVE BIOGEL PI INDICATOR 7.5 (GLOVE) ×2
GLOVE BIOGEL PI INDICATOR 8 (GLOVE) ×14
GLOVE BIOGEL PI INDICATOR 8.5 (GLOVE) ×6
GLOVE ECLIPSE 7.5 STRL STRAW (GLOVE) ×23 IMPLANT
GLOVE ECLIPSE 8.0 STRL XLNG CF (GLOVE) ×2 IMPLANT
GLOVE ECLIPSE 8.5 STRL (GLOVE) ×3 IMPLANT
GLOVE EXAM NITRILE LRG STRL (GLOVE) IMPLANT
GLOVE EXAM NITRILE MD LF STRL (GLOVE) IMPLANT
GLOVE EXAM NITRILE XL STR (GLOVE) IMPLANT
GLOVE EXAM NITRILE XS STR PU (GLOVE) IMPLANT
GOWN STRL NON-REIN LRG LVL3 (GOWN DISPOSABLE) ×2 IMPLANT
GOWN STRL REUS W/ TWL LRG LVL3 (GOWN DISPOSABLE) ×2 IMPLANT
GOWN STRL REUS W/ TWL XL LVL3 (GOWN DISPOSABLE) ×6 IMPLANT
GOWN STRL REUS W/TWL 2XL LVL3 (GOWN DISPOSABLE) ×18 IMPLANT
GOWN STRL REUS W/TWL LRG LVL3 (GOWN DISPOSABLE) ×10
GOWN STRL REUS W/TWL XL LVL3 (GOWN DISPOSABLE) ×10
INSERT FOGARTY 61MM (MISCELLANEOUS) IMPLANT
INSERT FOGARTY SM (MISCELLANEOUS) IMPLANT
KIT BASIN OR (CUSTOM PROCEDURE TRAY) ×10 IMPLANT
KIT DILATOR XLIF 5 (KITS) ×1 IMPLANT
KIT INFUSE MEDIUM (Orthopedic Implant) ×3 IMPLANT
KIT NDL NVM5 EMG ELECT (KITS) IMPLANT
KIT NEEDLE NVM5 EMG ELECT (KITS) ×3 IMPLANT
KIT NEEDLE NVM5 EMG ELECTRODE (KITS) ×2
KIT ROOM TURNOVER OR (KITS) ×9 IMPLANT
KIT SURGICAL ACCESS MAXCESS 4 (KITS) ×3 IMPLANT
KIT XLIF (KITS) ×2
LIQUID BAND (GAUZE/BANDAGES/DRESSINGS) ×2 IMPLANT
LOOP VESSEL MAXI BLUE (MISCELLANEOUS) ×2 IMPLANT
LOOP VESSEL MINI RED (MISCELLANEOUS) ×2 IMPLANT
MARKER SKIN DUAL TIP RULER LAB (MISCELLANEOUS) ×3 IMPLANT
NDL HYPO 25X1 1.5 SAFETY (NEEDLE) ×2 IMPLANT
NDL SPNL 18GX3.5 QUINCKE PK (NEEDLE) ×2 IMPLANT
NEEDLE HYPO 25X1 1.5 SAFETY (NEEDLE) ×5 IMPLANT
NEEDLE SPNL 18GX3.5 QUINCKE PK (NEEDLE) ×5 IMPLANT
NS IRRIG 1000ML POUR BTL (IV SOLUTION) ×10 IMPLANT
PACK FOAM VITOSS 10CC (Orthopedic Implant) ×6 IMPLANT
PACK LAMINECTOMY NEURO (CUSTOM PROCEDURE TRAY) ×10 IMPLANT
PAD ARMBOARD 7.5X6 YLW CONV (MISCELLANEOUS) ×19 IMPLANT
PAD DRESSING TELFA 3X8 NADH (GAUZE/BANDAGES/DRESSINGS) IMPLANT
PAD SHARPS MAGNETIC DISPOSAL (MISCELLANEOUS) ×3 IMPLANT
SPONGE INTESTINAL PEANUT (DISPOSABLE) ×19 IMPLANT
SPONGE LAP 18X18 X RAY DECT (DISPOSABLE) ×5 IMPLANT
SPONGE LAP 4X18 X RAY DECT (DISPOSABLE) IMPLANT
SPONGE SURGIFOAM ABS GEL SZ50 (HEMOSTASIS) ×3 IMPLANT
STAPLER SKIN PROX WIDE 3.9 (STAPLE) ×5 IMPLANT
STAPLER VISISTAT 35W (STAPLE) IMPLANT
SUT PROLENE 4 0 RB 1 (SUTURE)
SUT PROLENE 4-0 RB1 .5 CRCL 36 (SUTURE) ×8 IMPLANT
SUT PROLENE 5 0 CC1 (SUTURE) ×3 IMPLANT
SUT PROLENE 6 0 C 1 30 (SUTURE) ×2 IMPLANT
SUT PROLENE 6 0 CC (SUTURE) IMPLANT
SUT SILK 0 TIES 10X30 (SUTURE) ×2 IMPLANT
SUT SILK 2 0 TIES 10X30 (SUTURE) ×2 IMPLANT
SUT SILK 2 0 TIES 17X18 (SUTURE) ×5
SUT SILK 2 0SH CR/8 30 (SUTURE) IMPLANT
SUT SILK 2-0 18XBRD TIE BLK (SUTURE) ×3 IMPLANT
SUT SILK 3 0 TIES 10X30 (SUTURE) ×2 IMPLANT
SUT SILK 3 0SH CR/8 30 (SUTURE) IMPLANT
SUT VIC AB 0 CT1 27 (SUTURE) ×5
SUT VIC AB 0 CT1 27XBRD ANBCTR (SUTURE) ×7 IMPLANT
SUT VIC AB 1 CT1 18XBRD ANBCTR (SUTURE) ×5 IMPLANT
SUT VIC AB 1 CT1 8-18 (SUTURE) ×5
SUT VIC AB 2-0 CT1 18 (SUTURE) ×7 IMPLANT
SUT VIC AB 2-0 CT1 27 (SUTURE) ×5
SUT VIC AB 2-0 CT1 27XBRD (SUTURE) ×3 IMPLANT
SUT VIC AB 2-0 CTB1 (SUTURE) ×2 IMPLANT
SUT VIC AB 3-0 SH 27 (SUTURE) ×5
SUT VIC AB 3-0 SH 27X BRD (SUTURE) ×3 IMPLANT
SUT VIC AB 3-0 SH 8-18 (SUTURE) ×13 IMPLANT
SUT VICRYL 4-0 PS2 18IN ABS (SUTURE) ×3 IMPLANT
SYR 20ML ECCENTRIC (SYRINGE) ×7 IMPLANT
TAPE CLOTH 3X10 TAN LF (GAUZE/BANDAGES/DRESSINGS) ×15 IMPLANT
TOWEL OR 17X24 6PK STRL BLUE (TOWEL DISPOSABLE) ×6 IMPLANT
TOWEL OR 17X26 10 PK STRL BLUE (TOWEL DISPOSABLE) ×12 IMPLANT
TRAP SPECIMEN MUCOUS 40CC (MISCELLANEOUS) ×5 IMPLANT
TRAY FOLEY CATH 14FRSI W/METER (CATHETERS) ×7 IMPLANT
WATER STERILE IRR 1000ML POUR (IV SOLUTION) ×7 IMPLANT

## 2015-03-24 NOTE — H&P (Signed)
Patient ID:   [id number] Patient: Ethan Rose  Date of Birth: 1946-05-04 Visit Type: Office Visit   Date: 12/17/2014 03:00 PM Provider: Marchia Meiers. Vertell Limber MD   This 69 year old male presents for back pain.  History of Present Illness: 1.  back pain  Nil Xiong returns to discuss surgical options. Imaging on [facility].  I met with the patient and his family to review his situation and surgical options.  At this point my recommendation is that he undergo a two-stage spinal surgical procedure stage I will consist of L5-S1 anterior lumbar interbody fusion with Dr. Sherren Mocha early performing vascular surgical approach and this will immediately be followed by right-sided approach with XLIF procedure L2-3, L3 L4, L4 L5 levels  The second stage will involve Dr. [name] and will consist of pedicle subtraction osteotomy at L2 with T 10 through sacral pedicle screw fixation  I explained the risks and benefits of surgery and the reason for doing these procedures.  He will need to have improved sagittal balance and also correction of retrolisthesis and spinal stenosis with combination of pedicle subtraction osteotomy.  I also indicated that he may require pedicle screw fixation about the T10 level depending on satisfactory nature of fixation and may also require fixation into the ilium.      Medical/Surgical/Interim History Reviewed, no change.  Last detailed document date:09/30/2014.   PAST MEDICAL HISTORY, SURGICAL HISTORY, FAMILY HISTORY, SOCIAL HISTORY AND REVIEW OF SYSTEMS I have reviewed the patient's past medical, surgical, family and social history as well as the comprehensive review of systems as included on the [facility] history form dated 09/30/2014, which I have signed.  Family History: Reviewed, no changes.  Last detailed document: 09/30/2014.   Social History: Tobacco use reviewed. Reviewed, no changes. Last detailed document date: 09/30/2014.       MEDICATIONS(added, continued or stopped this visit): None.   ALLERGIES: Ingredient Reaction Medication Name Comment  NO KNOWN ALLERGIES     No known allergies.    Vitals Date Temp F BP Pulse Ht In Wt Lb BMI BSA Pain Score  12/17/2014  165/91 66 69 173 25.55  0/10      IMPRESSION Complex spinal deformity requiring 2 stage reconstructive surgery  Completed Orders (this encounter) Order Details Reason Side Interpretation Result Initial Treatment Date Region  Lifestyle education regarding diet Encouraged to eat a well balanced diet and follow up with primary care physician.        Hypertension education Continue to monitor blood pressure. If remains elevated, contact primary care physician.         Assessment/Plan # Detail Type Description   1. Assessment Body mass index (BMI) 25.0-25.9, adult (O84.16).   Plan Orders Today's instructions / counseling include(s) Lifestyle education regarding diet.       2. Assessment Elevated blood-pressure reading, w/o diagnosis of htn (R03.0).       3. 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       4. Assessment Radiculopathy, lumbar region (M54.16).       5. Assessment Scoliosis (and kyphoscoliosis), idiopathic (M41.20).       6. Assessment Scoliosis of lumbar spine, unspecified scoliosis type (M41.9).         Pain Assessment/Treatment Pain Scale: 0/10. Method: Numeric Pain Intensity Scale. Location: back. Onset: 09/14/1994. Duration: varies. Quality: discomforting. Pain Assessment/Treatment follow-up plan of care: Patient is taking medications as prescribed..  Fall Risk Plan The patient has not fallen in the last year.  Risks and benefits were discussed in detail with the patient and he wishes to proceed.  Orders: Instruction(s)/Education: Assessment Instruction  R03.0 Hypertension education  Z68.25 Lifestyle education regarding diet             Provider:  Marchia Meiers. Vertell Limber MD   12/28/2014 02:27 PM Dictation edited by: Marchia Meiers. Vertell Limber    CC Providers: [name] MD [address] [place], [place] [address]              Electronically signed by Marchia Meiers. Vertell Limber MD on 12/28/2014 02:27 PM  > [address] [place], [place] [address] Phone: [phone]   Patient ID:   [id number] Patient: Ethan Rose  Date of Birth: 1946-09-04 Visit Type: Office Visit   Date: 11/05/2014 02:30 PM Provider: Marchia Meiers. Vertell Limber MD   This 69 year old male presents for Follow Up of back pain.  History of Present Illness: 1.  Follow Up of back pain  Patient returns to review scoliosis films  I reviewed the patient's films with him and his family and discuss treatment options.  He has a significant thoracolumbar kyphosis for which she will require surgical correction and this will likely require pedicle subtraction osteotomy.  In addition we can employ minimally invasive surgical techniques for his lumbar spondylolisthesis.  I believe his leg weakness relates to his significant retrolisthesis the mid to low lumbar spine.  I indicated that I wanted to review his imaging with Dr. Randel Pigg and that based on that review I will make a specific surgical plan with the patient.  He does wish to proceed with surgery and would like to have this done here rather than at [facility]      Medical/Surgical/Interim History Reviewed, no change.  Last detailed document date:09/30/2014.   PAST MEDICAL HISTORY, SURGICAL HISTORY, FAMILY HISTORY, SOCIAL HISTORY AND REVIEW OF SYSTEMS I have reviewed the patient's past medical, surgical, family and social history as well as the comprehensive review of systems as included on the [facility] history form dated 09/30/2014, which I have signed.  Family History: Reviewed, no changes.  Last detailed document: 09/30/2014.   Social History: Tobacco use reviewed. Reviewed, no changes. Last detailed  document date: 09/30/2014.      MEDICATIONS(added, continued or stopped this visit): None.    ALLERGIES:  Ingredient Reaction Medication Name Comment  NO KNOWN ALLERGIES     No known allergies. Reviewed, no changes.    Vitals Date Temp F BP Pulse Ht In Wt Lb BMI BSA Pain Score  11/05/2014  153/82 72 69 172 25.4  1/10        IMPRESSION Progressive weakness in lower extremities with severe scoliosis with thoracolumbar kyphosis in the setting of prior pectus excavatum correction  Completed Orders (this encounter) Order Details Reason Side Interpretation Result Initial Treatment Date Region  Lifestyle education regarding diet Encouraged to eat a well balanced diet and follow up with primary care physician.        Hypertension education Continue to monitor blood pressure. If remains elevated, contact primary care physician.         Assessment/Plan # Detail Type Description   1. Assessment Body mass index (BMI) 25.0-25.9, adult (S49.67).   Plan Orders Today's instructions / counseling include(s) Lifestyle education regarding diet.       2. Assessment Elevated blood-pressure reading, w/o diagnosis of htn (R03.0).       3. Assessment Scoliosis of lumbar spine, unspecified scoliosis type (M41.9).  4. Assessment Scoliosis (and kyphoscoliosis), idiopathic (M41.20).       5. Assessment Radiculopathy, lumbar region (M54.16).       6. 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         Pain Assessment/Treatment Pain Scale: 1/10. Method: Numeric Pain Intensity Scale. Location: back. Onset: 09/14/1994. Duration: varies. Quality: discomforting. Pain Assessment/Treatment follow-up plan of care: Patient is taking medications as prescribed..  Fall Risk Plan The patient has not fallen in the last year.  Surgical correction of spondylolisthesis and scoliosis with kyphotic deformity.  Orders: Instruction(s)/Education: Assessment Instruction   R03.0 Hypertension education  Z68.25 Lifestyle education regarding diet             Provider:  Marchia Meiers. Vertell Limber MD  11/13/2014 02:21 PM Dictation edited by: Marchia Meiers. Vertell Limber    CC Providers: [name] MD [address] [place], [place] [address]         ----------------------------------------------------------------------------------------------------------------------------------------------------------------------        Electronically signed by Marchia Meiers. Vertell Limber MD on 11/13/2014 02:21 PM  > [address], [place] [address] Phone: [phone]   Patient ID:   [id number] Patient: Ethan Rose  Date of Birth: 25-Nov-1945 Visit Type: Office Visit   Date: 09/30/2014 02:30 PM Provider: Marchia Meiers. Vertell Limber MD   This 69 year old male presents for back pain and Weakness.  History of Present Illness: 1.  back pain  2.  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  Imaging in Feb by Ortho prompted [facility] & recommendation for two stage surgery.  He obtained second opinion from [facility] offering a visit to discuss minimally invasive procedure.  A friend and churchmember recommended DrStern.  PT, Lt AFO  Imaging on [facility]  Hx: kidney stones SxHx: tonsils 1953; double hernia 1995  Patient has a left foot drop which is occurred about a year ago and for which she now wears an AFO brace.  He complains of mild to moderate low back pain ranging from 1-4 out of 10 in severity.  He says his biggest problem now is that he is becoming very weak in both of his legs and says "I can't do anything" he is starting to lose strength in his right foot as well and is losing strength in his thighs.  I reviewed an MRI of his lumbar spine along with CT scan of his abdomen and pelvis and plain radiographs.  He has a  significant kyphotic deformity of his thoracolumbar junction with lumbar scoliosis and has retrolisthesis of L2 and L3 and of L3-L4 which is causing severe foraminal stenosis bilaterally.  I do not have full length scoliosis radiographs but he appears to be well one within the coronal plane although he has an anteverted pelvis and is compensated for this thoracolumbar deformity both with severe retrolisthesis at multiple lumbar segments as well as pelvic tilt when he stands.        PAST MEDICAL/SURGICAL HISTORY   (Detailed)  Disease/disorder Onset Date Management Date Comments    Hernia repair 1995     Tonsillectomy 1953   History of kidney stone          PAST MEDICAL HISTORY, SURGICAL HISTORY, FAMILY HISTORY, SOCIAL HISTORY AND REVIEW OF SYSTEMS I have reviewed the patient's past medical, surgical, family and social history as well as the comprehensive review of systems as included  on the [facility] history form dated 09/30/2014, which I have signed.  Family History  (Detailed)  Relationship Family Member Name Deceased Age at Death Condition Onset Age Cause of Death      Family history of Diabetes mellitus  N      Family history of Hypertension  N      Family history of Cardiovascular disease  N  Father    Stroke  N  Mother    Dementia  N   SOCIAL HISTORY  (Detailed) Tobacco use reviewed. Preferred language is Unknown.   Smoking status: Never smoker.  SMOKING STATUS Use Status Type Smoking Status Usage Per Day Years Used Total Pack Years  no/never  Never smoker       HOME ENVIRONMENT/SAFETY The patient has not fallen in the last year.        MEDICATIONS(added, continued or stopped this visit): None.    ALLERGIES:  Ingredient Reaction Medication Name Comment  NO KNOWN ALLERGIES     No known allergies.   REVIEW OF SYSTEMS System Neg/Pos Details  Constitutional Negative Chills, fatigue, fever, malaise, night sweats, weight gain  and weight loss.  ENMT Negative Ear drainage, hearing loss, nasal drainage, otalgia, sinus pressure and sore throat.  Eyes Negative Eye discharge, eye pain and vision changes.  Respiratory Negative Chronic cough, cough, dyspnea, known TB exposure and wheezing.  Cardio Positive Edema, Edema in feet.  Cardio Negative Chest pain, claudication and irregular heartbeat/palpitations.  GI Negative Abdominal pain, blood in stool, change in stool pattern, constipation, decreased appetite, diarrhea, heartburn, nausea and vomiting.  GU Negative Dribbling, dysuria, erectile dysfunction, hematuria, polyuria, slow stream, urinary frequency, urinary incontinence and urinary retention.  Endocrine Negative Cold intolerance, heat intolerance, polydipsia and polyphagia.  Neuro Positive Extremity weakness, Leg weakness.  Neuro Negative Dizziness, gait disturbance, headache, memory impairment, numbness in extremity, seizures and tremors.  Psych Negative Anxiety, depression and insomnia.  Integumentary Negative Brittle hair, brittle nails, change in shape/size of mole(s), hair loss, hirsutism, hives, pruritus, rash and skin lesion.  MS Positive Back pain.  MS Negative Joint pain, joint swelling, muscle weakness and neck pain.  Hema/Lymph Negative Easy bleeding, easy bruising and lymphadenopathy.  Allergic/Immuno Negative Contact allergy, environmental allergies, food allergies and seasonal allergies.  Reproductive Negative Penile discharge and sexual dysfunction.    Vitals Date Temp F BP Pulse Ht In Wt Lb BMI BSA Pain Score  09/30/2014  160/82 69 69 174 25.7  1/10     PHYSICAL EXAM General Level of Distress: no acute distress Overall Appearance: normal    Cardiovascular Cardiac: regular rate and rhythm without murmur  Respiratory Lungs: clear to auscultation  Neurological Recent and Remote Memory: normal Attention Span and Concentration:   normal Language: normal Fund of  Knowledge: normal  Right Left Sensation: normal normal Upper Extremity Coordination: normal normal  Lower Extremity Coordination: normal normal  Musculoskeletal Gait and Station: normal  Right Left Upper Extremity Muscle Strength: normal normal Lower Extremity Muscle Strength: normal normal Upper Extremity Muscle Tone:  normal normal Lower Extremity Muscle Tone: normal normal  Motor Strength Upper and lower extremity motor strength was tested in the clinically pertinent muscles. Any abnormal findings will be noted below.   Right Left Knee Extensor: 4/5 4/5 Tib Anterior: 4/5 3/5 EHL: 4/5 3/5   Deep Tendon Reflexes  Right Left Biceps: normal normal Triceps: normal normal Brachiloradialis: normal normal Patellar: normal normal Achilles: normal normal  Sensory Sensation was tested at L1 to S1.  Cranial Nerves II. Optic Nerve/Visual Fields: normal III. Oculomotor: normal IV. Trochlear: normal V. Trigeminal: normal VI. Abducens: normal VII. Facial: normal VIII. Acoustic/Vestibular: normal IX. Glossopharyngeal: normal X. Vagus: normal XI. Spinal Accessory: normal XII. Hypoglossal: normal  Motor and other Tests Lhermittes: negative Rhomberg: negative    Right Left Hoffman's: normal normal Clonus: normal normal Babinski: normal normal SLR: negative negative Patrick's Corky Sox): negative negative Toe Walk: normal normal Toe Lift: normal normal Heel Walk: normal normal SI Joint: nontender nontender   Additional Findings:  Patient falls over when he attempts to squat on either leg.  He has a pectus excavatum and a thoracolumbar kyphosis.  He has weakness in his hip abductors bilaterally.  He is able to bend to touch his toes but cannot stand on his toes or his heels.    DIAGNOSTIC RESULTS Diagnostic report text  CLINICAL DATA: Chronic low back pain, increasing over the last 6 months. Limited range of motion with unsteady gait and bilateral leg weakness. No  previous relevant surgery.  EXAM: MRI LUMBAR SPINE WITHOUT CONTRAST  TECHNIQUE: Multiplanar, multisequence MR imaging was performed. No intravenous contrast was administered.  COMPARISON: None.  FINDINGS: Five lumbar type vertebral bodies are assumed. There is an S-shaped scoliosis, convex the right at L1-2 and to the left at L4. The lateral alignment is near anatomic. There is a grade 1 retrolisthesis at L2-3. Endplate degenerative changes are present throughout the spine. There is no evidence of fracture or pars defect.  The conus medullaris extends to the L1 level and appears normal. No paraspinal abnormalities are identified.  L1-2: Mild disc bulging with anterior osteophytes. No significant spinal stenosis or nerve root encroachment.  L2-3: There is advanced disc space loss with disc bulging, osteophytes and endplate degeneration asymmetric to the right. There is mild right lateral recess stenosis. The foramina are sufficiently patent.  L3-4: Disc bulging, facet and ligamentous hypertrophy contribute to mild central and lateral recess stenosis bilaterally. In addition, there is mild right-greater-than-left foraminal stenosis.  L4-5: Annular disc bulging, facet and ligamentous hypertrophy contribute to mild central, lateral recess and foraminal stenosis bilaterally. The foraminal narrowing is slightly worse on the right.  L5-S1: Disc height is relatively maintained. There is mild bilateral facet hypertrophy contributing to minimal left foraminal narrowing. No nerve root encroachment identified.  IMPRESSION: 1. There is multilevel spondylosis associated with a biconvex scoliosis. 2. There is mild multifactorial spinal stenosis at L3-4 and L4-5. At both levels, there is mild to moderate narrowing of the lateral recesses and foramina, worse on the right. The potential for nerve root encroachment seems greatest within the right L3-4 foramen. 3. No focal disc  herniation, high-grade spinal stenosis or gross nerve root compression.   Electronically Signed By: Camie Patience M.D. On: 12/31/2013 15:21    IMPRESSION Patient appears to have a significant kyphotic deformity of the thoracolumbar spine.  His CT scan demonstrates a pectus excavatum.  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  I agree with the physician from [facility] that he will need a multistage corrective surgery and told him that there may be modifications of the exact surgical approach which could be done less invasively but this would be a big surgery.  He states that [facility] is [facility] and that they have discussed a minimal surgery with the laser and I did not think this would be very useful to  him since it will not correct the underlying structural abnormality which is the basis for his pain and weakness.  He is also seeking an opinion from Dr. Louanne Skye and I told him I thought this was a good idea and that I would not have a definitive answer for him unless I had full length films and he would need to decide what he wants to do.  Completed Orders (this encounter) Order Details Reason Side Interpretation Result Initial Treatment Date Region  Dietary management education, guidance, and counseling Encouraged to eat a well balanced diet and follow up with primary care physician.        Hypertension education Continue to monitor blood pressure regularly. If blood pressure remains elevated, contact your primary care physician.        Lumbar Spine- AP/Lat/Obls/Spot/Flex/Ex      09/30/2014 All Levels to All Levels   Assessment/Plan # Detail Type Description   1. Assessment Scoliosis (and kyphoscoliosis), idiopathic (M41.20).       2. Assessment Radiculopathy, lumbar region (M54.16).       3. Assessment Body mass index (BMI) 25.0-25.9, adult (W80.88).   Plan  Orders Today's instructions / counseling include(s) Dietary management education, guidance, and counseling.       4. Assessment Elevated blood-pressure reading, w/o diagnosis of htn (R03.0).       5. Assessment Scoliosis of lumbar spine, unspecified scoliosis type (M41.9).       6. 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         Pain Assessment/Treatment Pain Scale: 1/10. Method: Numeric Pain Intensity Scale. Location: back. Onset: 09/14/1994. Duration: varies. Quality: discomforting. Pain Assessment/Treatment follow-up plan of care: Patient is taking over the counter medications for relief..  Fall Risk Plan The patient has not fallen in the last year.  Patient will return to review full length scoliosis films to discuss specific treatment options.  Orders: Diagnostic Procedures: Assessment Procedure  M41.20 Scoliosis- AP/Lat  M43.17 Lumbar Spine- AP/Lat/Obls/Spot/Flex/Ex  Instruction(s)/Education: Assessment Instruction  R03.0 Hypertension education  Z68.25 Dietary management education, guidance, and counseling             Provider:  Marchia Meiers. Vertell Limber MD  10/01/2014 08:34 AM Dictation edited by: Marchia Meiers. Vertell Limber    CC Providers: [name] MD [address] [place], [place] [address] ----------------------------------------------------------------------------------------------------------------------------------------------------------------------        Electronically signed by Marchia Meiers. Vertell Limber MD on 10/01/2014 08:34 AM

## 2015-03-24 NOTE — Op Note (Signed)
03/24/2015  12:51 PM  PATIENT:  Ethan Rose  69 y.o. male  PRE-OPERATIVE DIAGNOSIS:  Idiopathic Scoliosis, Spondylolisthesis, lumbosacral region; Radiculopathy, Lumbar region  POST-OPERATIVE DIAGNOSIS:  Idiopathic Scoliosis,Spondylolisthesis,lumbosacral region;Radiculopathy,Lumbar region  PROCEDURE:  Procedure(s) with comments: Right Lumbar two-Three,Lumbar Three-Four,Lumbar Four-Five Anterior lateral lumbar interbody fusion  (Right) - right ABDOMINAL EXPOSURE (N/A) - left side approach  SURGEON:  Surgeon(s) and Role: Panel 1:    * [name], MD - Primary    * Kristeen Miss, MD - Assisting  Panel 2:    * [name], MD - Primary  PHYSICIAN ASSISTANT:   ASSISTANTS: Poteat, RN   ANESTHESIA:   general  EBL:  Total I/O In: 2200 [I.V.:2200] Out: 300 [Urine:300]  BLOOD ADMINISTERED:none  DRAINS: none   LOCAL MEDICATIONS USED:  MARCAINE     SPECIMEN:  No Specimen  DISPOSITION OF SPECIMEN:  N/A  COUNTS:  YES  TOURNIQUET:  * No tourniquets in log *  DICTATION: Patient is a 69 year old with severe spondylosis stenosis and scoliosis of the thoraco-lumbar spine. It was elected to take him to surgery for anterolateral decompression as stage I of planned two stage decompression and stabilization surgery.  Patient was intitially positioned for ALIF surgery at L 5 S 1 level, but Dr. Scot Dock was unable to expose this level due to dense adhesions and scar tissue, so we decided to abort that portion of the procedure and proceed with anterolateral decompression and fusion portion of the surgery. .  Procedure: Patient was placed in a left lateral decubitus position on the operative table and using orthogonally projected C-arm fluoroscopy the patient was placed so that the L2-3 L3-4 and L4-5 levels were visualized in AP and lateral plane. The patient was then taped into position. The table was flexed so as to expose the L4-5 level as the patient has a high iliac crest. Skin  was marked along with a posterior finger dissection incision. His flank was then prepped and draped in usual sterile fashion and incisions were made sequentially at L4-5 L3-4 and L2-3 levels. Posterior finger dissection was made to enter the retroperitoneal space and then subsequently the probe was inserted into the psoas muscle from the right side initially at the L4-5 level. After mapping the neural elements were able to dock the probe per the midpoint of this vertebral level and without indications electrically of too close proximity to the neural tissues. Subsequently the self-retaining tractor was.after sequential dilators were utilized the shim was employed and the interspace was cleared of psoas muscle and then incised. A thorough discectomy was performed. Angled instruments were used to clear the interspace of disc material. After thorough discectomy was performed and this was performed using AP and lateral fluoroscopy a 10 lordotic by 55 x  22 mm implant was packed with BMP and Vitoss with autologous blood. This was tamped into position using the slides and its position was confirmed on AP and lateral fluoroscopy. Subsequently exposure was performed at the L3-4 level and similar dissection was performed with locking of the self-retaining retractor. At this level were able to place a 10 standard by 22 x 55 mm implant packed in a similar fashion. At the L2-3 level were able to place an 10 mm lordotic by 55 x 22 mm implant packed in a similar fashion. Hemostasis was assured the wounds were irrigated interrupted Vicryl sutures.  Patient was extubated and taken to recovery having tolerated his surgery well.     PLAN OF CARE: Admit to  inpatient   PATIENT DISPOSITION:  PACU - hemodynamically stable.   Delay start of Pharmacological VTE agent (>24hrs) due to surgical blood loss or risk of bleeding: yes  

## 2015-03-24 NOTE — Evaluation (Addendum)
Occupational Therapy Evaluation Patient Details Name: Ethan Rose MRN: [id number] DOB: Nov 08, 1946 Today's Date: 03/24/2015    History of Present Illness 69 y.o. s/p right Lumbar two-Three,Lumbar Three-Four,Lumbar Four-Five Anterior lateral lumbar interbody fusion (Right) and abdominal exposure.   Clinical Impression   Pt admitted with above. Pt independent with ADLs, PTA. Feel pt will benefit from acute OT to increase independence with BADLs, prior to d/c. Pt planning to d/c to SNF for rehab.    Follow Up Recommendations  SNF    Equipment Recommendations  Other (comment) (defer to next venue)    Recommendations for Other Services       Precautions / Restrictions Precautions Precautions: Back Precaution Booklet Issued: Yes (comment) Precaution Comments: educated on back precautions Required Braces or Orthoses: Spinal Brace Spinal Brace: Thoracolumbosacral orthotic;Applied in supine position Restrictions Weight Bearing Restrictions: No      Mobility Bed Mobility Overal bed mobility: Needs Assistance Bed Mobility: Rolling;Sidelying to Sit;Sit to Sidelying Rolling: Supervision;Min assist Sidelying to sit: Min assist     Sit to sidelying: Min assist General bed mobility comments: cues for technique. assist with trunk when coming from sidelying position.  Transfers                 General transfer comment: not assessed-pt became dizzy sitting EOB and felt he needed to lay back down.    Balance  No assist for balance sitting EOB-balance not formally assessed.                                          ADL Overall ADL's : Needs assistance/impaired     Grooming: Set up;Supervision/safety;Sitting           Upper Body Dressing : Moderate assistance;Bed level (Total assist for back brace in session-OT demonstrated; feel pt could manage shirt)   Lower Body Dressing: Maximal assistance;Bed level                 General ADL  Comments: educated on back brace. Pt became dizzy sitting EOB, so returned to bed.  Educated on AE, cost, and where it could be purchased.     Vision     Perception     Praxis      Pertinent Vitals/Pain Pain Assessment: 0-10 Pain Score: 1  Pain Location: lower back Pain Descriptors / Indicators: Aching Pain Intervention(s): Monitored during session;Repositioned;Other (comment) (notified nurse)     Hand Dominance     Extremity/Trunk Assessment Upper Extremity Assessment Upper Extremity Assessment: Overall WFL for tasks assessed   Lower Extremity Assessment Lower Extremity Assessment: Defer to PT evaluation       Communication Communication Communication: No difficulties   Cognition Arousal/Alertness: Awake/alert Behavior During Therapy: WFL for tasks assessed/performed Overall Cognitive Status: Within Functional Limits for tasks assessed                     General Comments       Exercises       Shoulder Instructions      Home Living Family/patient expects to be discharged to:: Skilled nursing facility Living Arrangements: Alone Available Help at Discharge: Family (most of the time next week)                         Home Equipment: Cane - quad  Prior Functioning/Environment Level of Independence: Independent with assistive device(s)        Comments: quad cane    OT Diagnosis: Acute pain   OT Problem List: Decreased range of motion;Decreased activity tolerance;Decreased knowledge of use of DME or AE;Decreased knowledge of precautions;Pain   OT Treatment/Interventions: Self-care/ADL training;DME and/or AE instruction;Therapeutic activities;Patient/family education;Balance training    OT Goals(Current goals can be found in the care plan section) Acute Rehab OT Goals Patient Stated Goal: pt would like to go to rehab OT Goal Formulation: With patient Time For Goal Achievement: 03/31/15 Potential to Achieve Goals: Good ADL  Goals Pt Will Perform Lower Body Bathing: with min guard assist;with adaptive equipment;sit to/from stand Pt Will Perform Lower Body Dressing: with min guard assist;with adaptive equipment;sit to/from stand Pt Will Transfer to Toilet: with min guard assist;ambulating;bedside commode Pt Will Perform Toileting - Clothing Manipulation and hygiene: with min guard assist;sit to/from stand Additional ADL Goal #1: Pt will independently verbalize 3/3 back precautions and maintain during ADLs/functional tasks. Additional ADL Goal #2: Caregiver will be independent with assisting pt with donning/doffing back brace.  OT Frequency: Min 2X/week   Barriers to D/C:            Co-evaluation              End of Session Equipment Utilized During Treatment: Back brace Nurse Communication: Mobility status;Other (comment) (pt dizzy; pain)  Activity Tolerance: Other (comment) (dizziness) Patient left: in bed;with family/visitor present   Time: 4492-0100 OT Time Calculation (min): 22 min Charges:  OT General Charges $OT Visit: 1 Procedure OT Evaluation $Initial OT Evaluation Tier I: 1 Procedure G-CodesBenito Rose OTR/L [phone] 03/24/2015, 4:46 PM

## 2015-03-24 NOTE — Transfer of Care (Signed)
Immediate Anesthesia Transfer of Care Note  Patient: Ethan Rose  Procedure(s) Performed: Procedure(s) with comments: Right Lumbar two-Three,Lumbar Three-Four,Lumbar Four-Five Anterior lateral lumbar interbody fusion  (Right) - right ABDOMINAL EXPOSURE (N/A) - left side approach  Patient Location: PACU  Anesthesia Type:General  Level of Consciousness: awake and patient cooperative  Airway & Oxygen Therapy: Patient Spontanous Breathing and Patient connected to nasal cannula oxygen  Post-op Assessment: Report given to RN and Post -op Vital signs reviewed and stable  Post vital signs: Reviewed  Last Vitals:  Filed Vitals:   03/24/15 0617  BP: 150/77  Pulse: 59  Temp: 36.7 C  Resp: 18    Complications: No apparent anesthesia complications

## 2015-03-24 NOTE — H&P (View-Only) (Signed)
[facility] [place]  Patient name: Ethan Rose MRN: [id number] DOB: Mar 16, 1946 Sex: male  REASON FOR CONSULT: Evaluate for anterior retroperitoneal exposure. Referred by Dr. Vertell Limber.  HPI: Ethan Rose is a 69 y.o. male with a long history of lower extremity weakness. His symptoms have been going on for about a year. He has significant spinal stenosis and scoliosis. He has tried physical therapy and injection therapy with no relief of his symptoms. His symptoms are aggravated by activity.  He is scheduled for anterior lumbar interbody fusion at the L5-S1 level and I have been asked to provide anterior retroperitoneal exposure for this. I believe he is subsequently point to have a right L2-L3, L3-L4, L4-L5, lateral lumbar interbody fusion.  He's had previous bilateral inguinal hernia repairs.  His only risk factor for peripheral vascular disease is a remote history of tobacco use. He denies any history of diabetes, hypertension, hypercholesterolemia, or history of premature cardiovascular disease.    Past Medical History  Diagnosis Date  . Kidney calculi     Multiple passed in the past.   . GERD (gastroesophageal reflux disease)   . Hay fever   . 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 Chronic low back pain   . Prediabetes 2015    HbA1c 6.4%: pt saw nutritionist   Family History  Problem Relation Age of Onset  . Stroke Mother   . Diabetes Mother   . Heart disease Mother   . Hypertension Mother   . Heart attack Mother   . Stroke Father   . Heart disease Father   . Diabetes Brother   . Hypertension Brother    SOCIAL HISTORY: History  Substance Use Topics  . Smoking status: Former Smoker -- 0.50 packs/day    Types: Cigarettes    Quit date: 03/03/2014  . Smokeless tobacco: Never Used  . Alcohol Use: 4.8 oz/week    8 Cans of beer per week   No Known Allergies Current Outpatient Prescriptions  Medication Sig Dispense Refill  . ACAI  BERRY PO Take by mouth.    . cholecalciferol (VITAMIN D) 400 UNITS TABS Take by mouth.    Marland Kitchen CINNAMON PO Take 1 tablet by mouth daily.    Marland Kitchen co-enzyme Q-10 30 MG capsule Take 30 mg by mouth 3 (three) times daily.    Marland Kitchen HYDROcodone-acetaminophen (VICODIN) 5-500 MG per tablet as directed. For kidney stone    . Multiple Vitamin (MULITIVITAMIN WITH MINERALS) TABS Take 1 tablet by mouth daily.    . Multiple Vitamins-Minerals (ZINC PO) Take by mouth daily. OTC    . NON FORMULARY daily. OTC Medication-CardioCover    . OVER THE COUNTER MEDICATION Take 1 tablet by mouth daily. Vitamin K2    . OVER THE COUNTER MEDICATION Take 1,000 mg by mouth daily. Curcumin- Turmeric capsules    . Probiotic Product (PROBIOTIC DAILY PO) Take by mouth.     No current facility-administered medications for this visit.   REVIEW OF SYSTEMS: Valu.Nieves ] denotes positive finding; [  ] denotes negative finding  CARDIOVASCULAR:  [ ]  chest pain   [ ]  chest pressure   [ ]  palpitations   [ ]  orthopnea   [ ]  dyspnea on exertion   [ ]  claudication   [ ]  rest pain   [ ]  DVT   [ ]  phlebitis PULMONARY:   [ ]  productive cough   [ ]  asthma   [ ]  wheezing NEUROLOGIC:   Valu.Nieves ]  weakness  [ ]  paresthesias  [ ]  aphasia  [ ]  amaurosis  [ ]  dizziness HEMATOLOGIC:   [ ]  bleeding problems   [ ]  clotting disorders MUSCULOSKELETAL:  [ ]  joint pain   [ ]  joint swelling Valu.Nieves ] leg swelling GASTROINTESTINAL: [ ]   blood in stool  [ ]   hematemesis GENITOURINARY:  [ ]   dysuria  [ ]   hematuria PSYCHIATRIC:  [ ]  history of major depression INTEGUMENTARY:  [ ]  rashes  [ ]  ulcers CONSTITUTIONAL:  [ ]  fever   [ ]  chills  PHYSICAL EXAM: Filed Vitals:   03/04/15 0909  BP: 148/81  Pulse: 72  Height: 5\' 8"  (1.727 m)  Weight: 174 lb (78.926 kg)  SpO2: 97%   GENERAL: The patient is a well-nourished male, in no acute distress. The vital signs are documented above. CARDIOVASCULAR: There is a regular rate and rhythm. I do not detect carotid bruits. He has palpable  posterior tibial pulses. PULMONARY: There is good air exchange bilaterally without wheezing or rales. ABDOMEN: Soft and non-tender with normal pitched bowel sounds.  MUSCULOSKELETAL: There are no major deformities or cyanosis. NEUROLOGIC: No focal weakness or paresthesias are detected. SKIN: There are no ulcers or rashes noted. PSYCHIATRIC: The patient has a normal affect.  DATA:  I have reviewed his MRI which was done on 12/31/2013. He has multilevel spondylosis. He has a biconvex scoliosis. There is multifactorial  Spinal stenosis at L3-L4 and L4-L5.  On his plain films I do not see significant calcific disease in his distal aorta and iliac arteries.  MEDICAL ISSUES: The patient appears to be a good candidate for anterior retroperitoneal exposure of L5-S1. I have reviewed our role in exposure of the spine in order to allow anterior lumbar interbody fusion at the appropriate levels. We have discussed the potential complications of surgery, including but not limited to, arterial or venous injury, thrombosis, or bleeding. We have also discussed the potential risks of wound healing problems, the development of a hernia, nerve injury, leg swelling, or other unpredictable medical problems. I've also explained that for the L5-S1 level there is a small risk of retrograde ejaculation. All the patient's questions were answered and they are agreeable to proceed. This surgery is scheduled for May 10.  [facility] [place] Beeper: [phone]

## 2015-03-24 NOTE — Progress Notes (Signed)
Patient admitted from PACU. Patient alert and oriented x 4. Patient c/o pain rating 4 on the scale, was medicated per order. Patient made comfortable and family at bed side. Will continue to monitor.

## 2015-03-24 NOTE — Interval H&P Note (Signed)
History and Physical Interval Note:  03/24/2015 7:08 AM  Ethan Rose  has presented today for surgery, with the diagnosis of Idiopathic Scoliosis, Spondylolisthesis, lumbosacral region; Radiculopathy, Lumbar region  The various methods of treatment have been discussed with the patient and family. After consideration of risks, benefits and other options for treatment, the patient has consented to  Procedure(s) with comments: L5-S1 Anterior lumbar interbody fusion with Dr. Donnetta Hutching for abdomen exposure (N/A) - L5-S1 Anterior lumbar interbody fusion with Dr. Donnetta Hutching for abdomen exposure Right L2-3 L3-4 L4-5 Anterior lateral lumbar interbody fusion  (Right) - Right L2-3 L3-4 L4-5 Anterior lateral lumbar interbody fusion ABDOMINAL EXPOSURE (N/A) as a surgical intervention .  The patient's history has been reviewed, patient examined, no change in status, stable for surgery.  I have reviewed the patient's chart and labs.  Questions were answered to the patient's satisfaction.     Deitra Mayo

## 2015-03-24 NOTE — OR Nursing (Signed)
Neuro Monitoring by Nuv[employer].DDay RN

## 2015-03-24 NOTE — Brief Op Note (Signed)
03/24/2015  12:51 PM  PATIENT:  Ethan Rose  69 y.o. male  PRE-OPERATIVE DIAGNOSIS:  Idiopathic Scoliosis, Spondylolisthesis, lumbosacral region; Radiculopathy, Lumbar region  POST-OPERATIVE DIAGNOSIS:  Idiopathic Scoliosis,Spondylolisthesis,lumbosacral region;Radiculopathy,Lumbar region  PROCEDURE:  Procedure(s) with comments: Right Lumbar two-Three,Lumbar Three-Four,Lumbar Four-Five Anterior lateral lumbar interbody fusion  (Right) - right ABDOMINAL EXPOSURE (N/A) - left side approach  SURGEON:  Surgeon(s) and Role: Panel 1:    * [name], MD - Primary    * Kristeen Miss, MD - Assisting  Panel 2:    * [name], MD - Primary  PHYSICIAN ASSISTANT:   ASSISTANTS: Poteat, RN   ANESTHESIA:   general  EBL:  Total I/O In: 2200 [I.V.:2200] Out: 300 [Urine:300]  BLOOD ADMINISTERED:none  DRAINS: none   LOCAL MEDICATIONS USED:  MARCAINE     SPECIMEN:  No Specimen  DISPOSITION OF SPECIMEN:  N/A  COUNTS:  YES  TOURNIQUET:  * No tourniquets in log *  DICTATION: Patient is a 69 year old with severe spondylosis stenosis and scoliosis of the thoraco-lumbar spine. It was elected to take him to surgery for anterolateral decompression as stage I of planned two stage decompression and stabilization surgery.  Patient was intitially positioned for ALIF surgery at L 5 S 1 level, but Dr. Scot Dock was unable to expose this level due to dense adhesions and scar tissue, so we decided to abort that portion of the procedure and proceed with anterolateral decompression and fusion portion of the surgery. .  Procedure: Patient was placed in a left lateral decubitus position on the operative table and using orthogonally projected C-arm fluoroscopy the patient was placed so that the L2-3 L3-4 and L4-5 levels were visualized in AP and lateral plane. The patient was then taped into position. The table was flexed so as to expose the L4-5 level as the patient has a high iliac crest. Skin  was marked along with a posterior finger dissection incision. His flank was then prepped and draped in usual sterile fashion and incisions were made sequentially at L4-5 L3-4 and L2-3 levels. Posterior finger dissection was made to enter the retroperitoneal space and then subsequently the probe was inserted into the psoas muscle from the right side initially at the L4-5 level. After mapping the neural elements were able to dock the probe per the midpoint of this vertebral level and without indications electrically of too close proximity to the neural tissues. Subsequently the self-retaining tractor was.after sequential dilators were utilized the shim was employed and the interspace was cleared of psoas muscle and then incised. A thorough discectomy was performed. Angled instruments were used to clear the interspace of disc material. After thorough discectomy was performed and this was performed using AP and lateral fluoroscopy a 10 lordotic by 55 x  22 mm implant was packed with BMP and Vitoss with autologous blood. This was tamped into position using the slides and its position was confirmed on AP and lateral fluoroscopy. Subsequently exposure was performed at the L3-4 level and similar dissection was performed with locking of the self-retaining retractor. At this level were able to place a 10 standard by 22 x 55 mm implant packed in a similar fashion. At the L2-3 level were able to place an 10 mm lordotic by 55 x 22 mm implant packed in a similar fashion. Hemostasis was assured the wounds were irrigated interrupted Vicryl sutures.  Patient was extubated and taken to recovery having tolerated his surgery well.     PLAN OF CARE: Admit to  inpatient   PATIENT DISPOSITION:  PACU - hemodynamically stable.   Delay start of Pharmacological VTE agent (>24hrs) due to surgical blood loss or risk of bleeding: yes  

## 2015-03-24 NOTE — Anesthesia Preprocedure Evaluation (Addendum)
Anesthesia Evaluation  Patient identified by MRN, date of birth, ID band Patient awake    Reviewed: Allergy & Precautions, NPO status , Patient's Chart, lab work & pertinent test results  History of Anesthesia Complications Negative for: history of anesthetic complications  Airway Mallampati: II  TM Distance: >3 FB Neck ROM: full    Dental  (+) Teeth Intact, Dental Advisory Given   Pulmonary former smoker,  breath sounds clear to auscultation        Cardiovascular negative cardio ROS  Rhythm:regular Rate:Normal     Neuro/Psych  Neuromuscular disease    GI/Hepatic Neg liver ROS, GERD-  Controlled,  Endo/Other  negative endocrine ROS  Renal/GU Kidney stones     Musculoskeletal  (+) Arthritis -,   Abdominal   Peds  Hematology negative hematology ROS (+)   Anesthesia Other Findings   Reproductive/Obstetrics negative OB ROS                           Anesthesia Physical Anesthesia Plan  ASA: II  Anesthesia Plan: General   Post-op Pain Management:    Induction: Intravenous  Airway Management Planned: Oral ETT  Additional Equipment:   Intra-op Plan:   Post-operative Plan: Extubation in OR  Informed Consent: I have reviewed the patients History and Physical, chart, labs and discussed the procedure including the risks, benefits and alternatives for the proposed anesthesia with the patient or authorized representative who has indicated his/her understanding and acceptance.     Plan Discussed with: CRNA, Anesthesiologist and Surgeon  Anesthesia Plan Comments:         Anesthesia Quick Evaluation

## 2015-03-24 NOTE — Interval H&P Note (Signed)
History and Physical Interval Note:  03/24/2015 7:16 AM  Ethan Rose  has presented today for surgery, with the diagnosis of Idiopathic Scoliosis, Spondylolisthesis, lumbosacral region; Radiculopathy, Lumbar region  The various methods of treatment have been discussed with the patient and family. After consideration of risks, benefits and other options for treatment, the patient has consented to  Procedure(s) with comments: L5-S1 Anterior lumbar interbody fusion with Dr. Donnetta Hutching for abdomen exposure (N/A) - L5-S1 Anterior lumbar interbody fusion with Dr. Donnetta Hutching for abdomen exposure Right L2-3 L3-4 L4-5 Anterior lateral lumbar interbody fusion  (Right) - Right L2-3 L3-4 L4-5 Anterior lateral lumbar interbody fusion ABDOMINAL EXPOSURE (N/A) as a surgical intervention .  The patient's history has been reviewed, patient examined, no change in status, stable for surgery.  I have reviewed the patient's chart and labs.  Questions were answered to the patient's satisfaction.     Ethan Rose D

## 2015-03-24 NOTE — Op Note (Signed)
    NAME: Ethan Rose   MRN: [id number] DOB: 26-Nov-1945    DATE OF OPERATION: 03/24/2015  PREOP DIAGNOSIS: Idiopathic Scoliosis, Spondylolisthesis, lumbosacral region; Radiculopathy, Lumbar region  POSTOP DIAGNOSIS: Same  PROCEDURE: Attempted anterior retroperitoneal exposure of L5-S1  SURGEON: Judeth Cornfield. Scot Dock, MD, FACS  ASSIST: Verdis Prime, RN  ANESTHESIA: Gen.   EBL: minimal  INDICATIONS: [name] is a 69 y.o. male who I was asked to provide anterior retroperitoneal exposure of L5-S1.  FINDINGS: There was extensive scar tissue which prohibited safe dissection of the retroperitoneal space overlying the L5-S1 disc. The patient had previous hernia repair and this may have been contributing.  TECHNIQUE: The patient was taken to the operating room and I marked the level of the L5-S1 disc after the patient had received a general anesthetic. The abdomen was prepped and draped in usual sterile fashion. A transverse incision was made at the marked level. Dissection was carried down to the anterior rectus sheath. The anterior rectus sheath was then divided transversely extending medially across the linea alba and laterally to the linea semi-lunaris. This allowed full mobilization of the left rectus abdominis muscle which was initially retracted medially to allow entry into the retroperitoneal space. Of note, he arcuate line was fairly low and although this was the L5-S1 level entering into the retroperitoneal space required division of the posterior rectus sheath above the arcuate line. I was able to get into the retroperitoneal space. A small hole was made in the retroperitoneum which was closed with a 3-0 Vicryl. The dissection was carried down and the retroperitoneal space to the psoas muscle also had significant scarring and lymphatic tissue overlying this. Further medial dissection was performed and at this level there was extensive scar tissue with a tubular structure prohibiting  further mobilization of the retro-peritoneal space to the right. This did not appear to be the ureter, and artery, or a nerve. I felt that this could potentially be related to the spermatic cord and therefore did not feel that it would be safe to divide this. Therefore continue the dissection above this further superiorly and was able to then a 5 and this space. However there was significant scarring over this and dense tissue which made it very difficult to mobilize the retroperitoneum to expose the L5-S1 disc space. I did not feel that I could safely expose the disc space because of the scar tissue and discussed this with Dr. Vertell Limber. Therefore at this point I stopped. Hemostasis was obtained in the wound and the wound was irrigated. The anterior rectus sheath was closed with #1 Vicryl. The subcutaneous layer was closed deeply or 2-0 Vicryl, a subcuticular layer of 3-0 Vicryl and the skin closed with a 4-0 Vicryl suture. Dermabond was applied.  Deitra Mayo, MD, FACS [facility]  DATE OF DICTATION:   03/24/2015

## 2015-03-24 NOTE — Anesthesia Postprocedure Evaluation (Signed)
Anesthesia Post Note  Patient: Ethan Rose  Procedure(s) Performed: Procedure(s) (LRB): Right Lumbar two-Three,Lumbar Three-Four,Lumbar Four-Five Anterior lateral lumbar interbody fusion  (Right) ABDOMINAL EXPOSURE (N/A)  Anesthesia type: General  Patient location: PACU  Post pain: Pain level controlled and Adequate analgesia  Post assessment: Post-op Vital signs reviewed, Patient's Cardiovascular Status Stable, Respiratory Function Stable, Patent Airway and Pain level controlled  Last Vitals:  Filed Vitals:   03/24/15 1400  BP: 128/57  Pulse: 66  Temp:   Resp: 15    Post vital signs: Reviewed and stable  Level of consciousness: awake, alert  and oriented  Complications: No apparent anesthesia complications

## 2015-03-24 NOTE — Progress Notes (Signed)
Awake, alert, conversant.  MAEW with good strength.  Doing well. 

## 2015-03-24 NOTE — Anesthesia Procedure Notes (Signed)
Procedure Name: Intubation Date/Time: 03/24/2015 7:42 AM Performed by: Jenne Campus Pre-anesthesia Checklist: Patient identified, Emergency Drugs available, Suction available, Patient being monitored and Timeout performed Patient Re-evaluated:Patient Re-evaluated prior to inductionOxygen Delivery Method: Circle system utilized Preoxygenation: Pre-oxygenation with 100% oxygen Intubation Type: IV induction Ventilation: Mask ventilation without difficulty, Oral airway inserted - appropriate to patient size and Two handed mask ventilation required Laryngoscope Size: Miller and 2 Grade View: Grade I Tube type: Oral Tube size: 7.5 mm Number of attempts: 1 Airway Equipment and Method: Stylet Placement Confirmation: ETT inserted through vocal cords under direct vision,  positive ETCO2,  CO2 detector and breath sounds checked- equal and bilateral Secured at: 22 cm Tube secured with: Tape Dental Injury: Teeth and Oropharynx as per pre-operative assessment

## 2015-03-24 NOTE — Progress Notes (Signed)
Patient ID: Ethan Rose, male   DOB: 09/10/1946, 69 y.o.   MRN: [id number]  Alert, reporting mild nausea, but lumbar pain is well controlled. No abdominal or leg pain. Good strength with plantar flexion bilaterally, remains somewhat weak on dorsiflexion bilaterally - not unexpected. Belly is soft, nontender. Incisions without erythema, swelling, or drainage.   Verdis Prime RN BSN

## 2015-03-25 ENCOUNTER — Inpatient Hospital Stay (HOSPITAL_COMMUNITY): Payer: Medicare Other

## 2015-03-25 ENCOUNTER — Encounter (HOSPITAL_COMMUNITY): Payer: Self-pay | Admitting: Neurosurgery

## 2015-03-25 MED ORDER — PANTOPRAZOLE SODIUM 40 MG PO TBEC
40.0000 mg | DELAYED_RELEASE_TABLET | Freq: Every day | ORAL | Status: DC
  Administered 2015-03-26: 40 mg via ORAL
  Filled 2015-03-25: qty 1

## 2015-03-25 MED FILL — Heparin Sodium (Porcine) Inj 1000 Unit/ML: INTRAMUSCULAR | Qty: 30 | Status: AC

## 2015-03-25 MED FILL — Sodium Chloride IV Soln 0.9%: INTRAVENOUS | Qty: 1000 | Status: AC

## 2015-03-25 NOTE — Progress Notes (Signed)
Subjective: Patient reports "I'm not having much pain at all. I might when I start walking"  Objective: Vital signs in last 24 hours: Temp:  [97.3 F (36.3 C)-98.8 F (37.1 C)] 97.5 F (36.4 C) (05/11 0542) Pulse Rate:  [64-87] 70 (05/11 0542) Resp:  [13-22] 20 (05/11 0542) BP: (120-156)/(56-79) 122/65 mmHg (05/11 0542) SpO2:  [94 %-97 %] 95 % (05/11 0542) Weight:  [83.462 kg (184 lb)] 83.462 kg (184 lb) (05/10 1518)  Intake/Output from previous day: 05/10 0701 - 05/11 0700 In: 2200 [I.V.:2200] Out: 2175 [Urine:2175] Intake/Output this shift:    Alert, conversant. Pain well-controlled. Plantar flexion strong bilat, dorsiflexion R>L. BS actx4; belly soft, nontender. Incisions without erythema, swelling, or drainage. Good appetite.   Lab Results: No results for input(s): WBC, HGB, HCT, PLT in the last 72 hours. BMET No results for input(s): NA, K, CL, CO2, GLUCOSE, BUN, CREATININE, CALCIUM in the last 72 hours.  Studies/Results: Dg Lumbar Spine 2-3 Views  03/24/2015   CLINICAL DATA:  Lumbar spine surgery.  EXAM: LUMBAR SPINE - 2-3 VIEW  COMPARISON:  09/30/2014.  FINDINGS: Interbody fusion of multiple lumbar discs. Lumbar vertebra cannot be counted on single AP view.  IMPRESSION: Interbody fusion of multiple lumbar vertebra.   Electronically Signed   By: Marcello Moores  Register   On: 03/24/2015 14:04   Dg C-arm Gt 120 Min  03/24/2015   CLINICAL DATA:  Perioperative localization for lumbar spine fusion flow idiopathic scoliosis  EXAM: DG C-ARM GT 120 MIN  FLUOROSCOPY TIME:  Radiation Exposure Index (as provided by the fluoroscopic device):  If the device does not provide the exposure index:  Fluoroscopy Time (in minutes and seconds):  2 minutes 53 seconds  Number of Acquired Images:  COMPARISON:  None  FINDINGS: C-arm fluoroscopy was provided for up to 120 minutes during lumbar laminectomy and interbody fusion .  IMPRESSION: C-arm fluoroscopy provided.   Electronically Signed   By: Ivar Drape  M.D.   On: 03/24/2015 14:12    Assessment/Plan: Doing well 1st day post-op.   LOS: 1 day  Mobilize in TLSO. Pt agreeable with plan to proceed with second stage on Friday.    Verdis Prime 03/25/2015, 9:04 AM

## 2015-03-25 NOTE — Care Management Note (Signed)
Case Management Note  Patient Details  Name: Ethan Rose MRN: [id number] Date of Birth: 07/31/1946  Subjective/Objective:      ADMITTED FOR SURGERY              Action/Plan: CM FOLLOWING FOR DCP, POSSIBLY SNF PLACEMENT, 2ND STAGE OF SURGERY IS SCHEDULED FOR 03/27/2015 Expected Discharge Date:      POSSIBLY 04/01/2015            Expected Discharge Plan:   SNF  In-House Referral:   SW CONSULT   Status of Service:   IN PROGRESS  Additional Comments:  [name] [phone] 03/25/2015, 10:44 AM

## 2015-03-25 NOTE — Clinical Social Work Note (Signed)
Clinical Social Work Assessment  Patient Details  Name: Ethan Rose MRN: [id number] Date of Birth: Jan 03, 1946  Date of referral:  03/24/15               Reason for consult:  Facility Placement                Housing/Transportation Living arrangements for the past 2 months:  Single Family Home Source of Information:  Patient Patient Interpreter Needed:  None Criminal Activity/Legal Involvement Pertinent to Current Situation/Hospitalization:  No - Comment as needed Significant Relationships:  Adult Children Lives with:  Self Do you feel safe going back to the place where you live?  Yes Need for family participation in patient care:  No (Coment)  Care giving concerns:  N/A   Facilities manager / plan: CSW met the pt the bedside. CSW introduced self and purpose of the visit. CSW and pt discussed SNF rehab. Pt reported that he prearranged rehab admission to [facility]. Pt reported that he does not have questions about SNF rehab. Pt reported Ivin Booty from [place] explained the SNF process to him. CSW answered all questions in which the family inquired about. CSW provided pt with contact information for further questions. CSW will continue to follow this pt and assist with discharge as needed.   Patient/Family's Response to care:  Pt reported the care in which he is receiving is well.   Patient/Family's Understanding of and Emotional Response to Diagnosis, Current Treatment, and Prognosis:  Pt provided CSW with a detailed account to the events which led up to his surgery. Pt reported being in some back pain. Pt reported that he will have strews and rods placed in his back on Friday. Pt reported knowing that he would need additional assistance before he could return home, that is why he make arranged for rehab prior to surgery.   Emotional Assessment Appearance:  Appears stated age Attitude/Demeanor/Rapport:   Calm Affect (typically observed):  Appropriate (Appropriate  ) Orientation:  Oriented to Self, Oriented to Place, Oriented to  Time, Oriented to Situation Alcohol / Substance use:  Not Applicable Psych involvement (Current and /or in the community):  No (Comment)  Discharge Needs  Concerns to be addressed:  Denies Needs/Concerns at this time Readmission within the last 30 days:  No Current discharge risk:  None Barriers to Discharge:  No Barriers Identified   Catheryn Slifer, LCSW 03/25/2015, 12:55 PM

## 2015-03-25 NOTE — Evaluation (Signed)
Physical Therapy Evaluation Patient Details Name: Ethan Rose MRN: [id number] DOB: Sep 24, 1946 Today's Date: 03/25/2015   History of Present Illness  69 y.o. s/p right Lumbar two-Three,Lumbar Three-Four,Lumbar Four-Five Anterior lateral lumbar interbody fusion (Right) and abdominal exposure. (part of a 2 step decompression)  Clinical Impression  Patient demonstrates deficits in functional mobility as indicated below. Will need continued skilled PT to address deficits and maximize function. Will see as indicated and progress as tolerated. Will reassess mobility following part 2 of decompression surgery.    Follow Up Recommendations SNF;Supervision/Assistance - 24 hour    Equipment Recommendations  Rolling walker with 5" wheels    Recommendations for Other Services       Precautions / Restrictions Precautions Precautions: Back Precaution Booklet Issued: Yes (comment) Precaution Comments: educated on back precautions Required Braces or Orthoses: Spinal Brace Spinal Brace: Thoracolumbosacral orthotic;Applied in supine position Restrictions Weight Bearing Restrictions: No      Mobility  Bed Mobility Overal bed mobility: Needs Assistance Bed Mobility: Rolling;Sidelying to Sit;Sit to Sidelying Rolling: Supervision;Min assist Sidelying to sit: Min assist     Sit to sidelying: Min assist General bed mobility comments: VCs for positioning and technique and sequencing. Assist for rolling to don brace. Patient mobilizing well with cues  Transfers Overall transfer level: Needs assistance Equipment used: Rolling walker (2 wheeled) Transfers: Sit to/from Stand Sit to Stand: Min assist         General transfer comment: VCs for hand placement and use of RW  Ambulation/Gait Ambulation/Gait assistance: Min guard;Min assist Ambulation Distance (Feet): 140 Feet Assistive device: Rolling walker (2 wheeled) Gait Pattern/deviations: Step-through pattern;Decreased stride  length Gait velocity: decreased Gait velocity interpretation: Below normal speed for age/gender General Gait Details: decreased  gait speed, VCs for posture  Stairs            Wheelchair Mobility    Modified Rankin (Stroke Patients Only)       Balance                                             Pertinent Vitals/Pain Pain Assessment: 0-10 Pain Score: 2  Pain Location: lower back Pain Descriptors / Indicators: Aching    Home Living Family/patient expects to be discharged to:: Skilled nursing facility Living Arrangements: Alone   Type of Home: House Home Access: Stairs to enter Entrance Stairs-Rails: None Entrance Stairs-Number of Steps: 2 Home Layout: Able to live on main level with bedroom/bathroom Home Equipment: Cane - quad Additional Comments: walk in shower, with bench, standard height toilets    Prior Function Level of Independence: Independent with assistive device(s)         Comments: quad cane     Hand Dominance   Dominant Hand: Right    Extremity/Trunk Assessment   Upper Extremity Assessment: Overall WFL for tasks assessed           Lower Extremity Assessment: Generalized weakness;RLE deficits/detail;LLE deficits/detail RLE Deficits / Details: 4+/5 LLE Deficits / Details: generalized weakness with strength noted 3+/5 gross motions compared to right     Communication   Communication: No difficulties  Cognition Arousal/Alertness: Awake/alert Behavior During Therapy: WFL for tasks assessed/performed Overall Cognitive Status: Within Functional Limits for tasks assessed                      General Comments General comments (skin integrity, edema,  etc.): Patient unable to don/doff brace without physical assist    Exercises        Assessment/Plan    PT Assessment Patient needs continued PT services  PT Diagnosis Difficulty walking;Abnormality of gait;Generalized weakness;Acute pain   PT Problem List  Decreased strength;Decreased range of motion;Decreased activity tolerance;Decreased balance;Decreased mobility;Decreased coordination;Decreased knowledge of precautions;Pain  PT Treatment Interventions DME instruction;Gait training;Stair training;Functional mobility training;Therapeutic activities;Therapeutic exercise;Balance training;Patient/family education   PT Goals (Current goals can be found in the Care Plan section) Acute Rehab PT Goals Patient Stated Goal: to get some rehab and get stronger  PT Goal Formulation: With patient Time For Goal Achievement: 04/08/15 Potential to Achieve Goals: Good    Frequency Min 5X/week   Barriers to discharge Decreased caregiver support      Co-evaluation               End of Session Equipment Utilized During Treatment: Gait belt;Back brace Activity Tolerance: Patient tolerated treatment well;Patient limited by pain Patient left: in chair;with call bell/phone within reach;with family/visitor present Nurse Communication: Mobility status         Time: 0942-1009 PT Time Calculation (min) (ACUTE ONLY): 27 min   Charges:   PT Evaluation $Initial PT Evaluation Tier I: 1 Procedure PT Treatments $Therapeutic Activity: 8-22 mins   PT G CodesDuncan Dull 2015/04/16, 5:27 PM Alben Deeds, [place] DPT  [phone]

## 2015-03-25 NOTE — Clinical Social Work Placement (Signed)
   CLINICAL SOCIAL WORK PLACEMENT  NOTE  Date:  03/25/2015  Patient Details  Name: DAVYON FISCH MRN: [id number] Date of Birth: [birth year]  Clinical Social Work is seeking post-discharge placement for this patient at the [place] level of care (*CSW will initial, date and re-position this form in  chart as items are completed):      Patient/family provided with [facility] Work Department's list of facilities offering this level of care within the geographic area requested by the patient (or if unable, by the patient's family).  Yes   Patient/family informed of their freedom to choose among providers that offer the needed level of care, that participate in Medicare, Medicaid or managed care program needed by the patient, have an available bed and are willing to accept the patient.  Yes   Patient/family informed of [facility]'s ownership interest in [facility] and [facility], as well as of the fact that they are under no obligation to receive care at these facilities.  PASRR submitted to EDS on 03/25/15     PASRR number received on 03/25/15     Existing PASRR number confirmed on       FL2 transmitted to all facilities in geographic area requested by pt/family on       FL2 transmitted to all facilities within larger geographic area on       Patient informed that his/her managed care company has contracts with or will negotiate with certain facilities, including the following:        Yes   Patient/family informed of bed offers received.  Patient chooses bed at [facility]     Physician recommends and patient chooses bed at      Patient to be transferred to [facility] on  .  Patient to be transferred to facility by       Patient family notified on   of transfer.  Name of family member notified:        PHYSICIAN       Additional Comment:    _______________________________________________ Greta Doom, LCSW 03/25/2015,  1:01 PM

## 2015-03-26 MED ORDER — BISACODYL 10 MG RE SUPP
10.0000 mg | Freq: Every day | RECTAL | Status: DC
  Administered 2015-03-26: 10 mg via RECTAL
  Filled 2015-03-26: qty 1

## 2015-03-26 MED ORDER — CEFAZOLIN SODIUM-DEXTROSE 2-3 GM-% IV SOLR
2.0000 g | INTRAVENOUS | Status: AC
  Administered 2015-03-27 (×2): 2 g via INTRAVENOUS

## 2015-03-26 NOTE — Progress Notes (Signed)
Subjective: Patient reports feeling better with less leg weakness  Objective: Vital signs in last 24 hours: Temp:  [98.1 F (36.7 C)-98.8 F (37.1 C)] 98.1 F (36.7 C) (05/12 1033) Pulse Rate:  [61-72] 72 (05/12 1033) Resp:  [18-20] 20 (05/12 1033) BP: (115-134)/(56-66) 134/66 mmHg (05/12 1033) SpO2:  [93 %-100 %] 95 % (05/12 1033)  Intake/Output from previous day: 05/11 0701 - 05/12 0700 In: 3 [I.V.:3] Out: 550 [Urine:550] Intake/Output this shift:    Physical Exam: Full strength bilateral HFs, PF, DF.  Mild abdominal distension, nontender.  Lab Results: No results for input(s): WBC, HGB, HCT, PLT in the last 72 hours. BMET No results for input(s): NA, K, CL, CO2, GLUCOSE, BUN, CREATININE, CALCIUM in the last 72 hours.  Studies/Results: Dg Thoracolumabar Spine  03/25/2015   CLINICAL DATA:  Scoliosis, lumbar radiculopathy  EXAM: THORACOLUMBAR SPINE 1V  COMPARISON:  10/01/2014  FINDINGS: Limited evaluation secondary to technique.  There is a chronic T12, L1 and L2 vertebral body compression fracture. There is 2-3 mm of retrolisthesis of L2 on L3. There is likely grade 1 anterolisthesis of L5 on S1. There is no acute fracture or static listhesis. There are interbody spacer devices at L2-3, L3-4 and L4-5.  The visualized portions of the lungs are clear.  There is no significant curvature greater than 5 degrees. Thoracolumbar kyphosis centered at L1. There is a minimal levocurvature of the lumbar spine. There are no intrinsic vertebral anomalies. No significant pelvic tilt. Soft tissues are unremarkable.  IMPRESSION: No significant thoracolumbar curvature greater than 5 degrees. 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   Electronically Signed   By: Kathreen Devoid   On: 03/25/2015 12:57   Dg Lumbar Spine 2-3 Views  03/24/2015   CLINICAL DATA:  Lumbar spine surgery.  EXAM: LUMBAR SPINE - 2-3 VIEW  COMPARISON:  09/30/2014.  FINDINGS: Interbody fusion of multiple lumbar discs. Lumbar  vertebra cannot be counted on single AP view.  IMPRESSION: Interbody fusion of multiple lumbar vertebra.   Electronically Signed   By: Marcello Moores  Register   On: 03/24/2015 14:04   Dg C-arm Gt 120 Min  03/24/2015   CLINICAL DATA:  Perioperative localization for lumbar spine fusion flow idiopathic scoliosis  EXAM: DG C-ARM GT 120 MIN  FLUOROSCOPY TIME:  Radiation Exposure Index (as provided by the fluoroscopic device):  If the device does not provide the exposure index:  Fluoroscopy Time (in minutes and seconds):  2 minutes 53 seconds  Number of Acquired Images:  COMPARISON:  None  FINDINGS: C-arm fluoroscopy was provided for up to 120 minutes during lumbar laminectomy and interbody fusion .  IMPRESSION: C-arm fluoroscopy provided.   Electronically Signed   By: Ivar Drape M.D.   On: 03/24/2015 14:12    Assessment/Plan: Patient doing well.  Will work on bowels.  Ready for second stage surgery tomorrow.    LOS: 2 days    Peggyann Shoals, MD 03/26/2015, 12:06 PM

## 2015-03-26 NOTE — Progress Notes (Signed)
Physical Therapy Treatment Patient Details Name: Ethan Rose MRN: [id number] DOB: [birth year] Today's Date: 03/26/2015    History of Present Illness 69 y.o. s/p right Lumbar two-Three,Lumbar Three-Four,Lumbar Four-Five Anterior lateral lumbar interbody fusion (Right) and abdominal exposure. (part of a 2 step decompression)    PT Comments    Progressing towards physical therapy goals. Focused on gait training today; relies heavily on RW, requiring intermittent cues for upright posture, hx of Lt foot drop but demonstrates good clearance, and did not require physical assist for balance. Patient will continue to benefit from skilled physical therapy services to further improve independence with functional mobility.   Follow Up Recommendations  SNF;Supervision/Assistance - 24 hour     Equipment Recommendations  Rolling walker with 5" wheels    Recommendations for Other Services       Precautions / Restrictions Precautions Precautions: Back Precaution Booklet Issued: Yes (comment) Precaution Comments: Reviewed precautions Required Braces or Orthoses: Spinal Brace Spinal Brace: Thoracolumbosacral orthotic;Applied in supine position Restrictions Weight Bearing Restrictions: No    Mobility  Bed Mobility Overal bed mobility: Needs Assistance Bed Mobility: Rolling;Sit to Sidelying Rolling: Supervision;Min assist Sidelying to sit: Min assist     Sit to sidelying: Min guard General bed mobility comments: Close guard for safety. VC for log roll technique. Requires extra time.  Transfers Overall transfer level: Needs assistance Equipment used: Rolling walker (2 wheeled) Transfers: Sit to/from Stand Sit to Stand: Min assist         General transfer comment: Min guard for safety. VC for back precautions and hand placement prior to rising.  Ambulation/Gait Ambulation/Gait assistance: Min guard Ambulation Distance (Feet): 275 Feet Assistive device: Rolling walker (2  wheeled) Gait Pattern/deviations: Step-through pattern;Decreased stride length;Decreased dorsiflexion - left;Trunk flexed Gait velocity: decreased   General Gait Details: VC intermittently for upright posture, tends to flex at waist. Notable Lt foot drop which is baseline, compensates clearance well with increased lt hip flexion. No loss of balance requiring physical assist. Relies heavily on RW for support.   Stairs            Wheelchair Mobility    Modified Rankin (Stroke Patients Only)       Balance Overall balance assessment: Needs assistance Sitting-balance support: Feet supported Sitting balance-Leahy Scale: Good     Standing balance support: Bilateral upper extremity supported;During functional activity Standing balance-Leahy Scale: Fair Standing balance comment: Pt able to static stand with no support to wash hands to sink                    Cognition Arousal/Alertness: Awake/alert Behavior During Therapy: WFL for tasks assessed/performed Overall Cognitive Status: Within Functional Limits for tasks assessed                      Exercises      General Comments        Pertinent Vitals/Pain Pain Assessment: No/denies pain Pain Score: 3  Pain Location: States incisional pain when ambulating due to brace. Pain Descriptors / Indicators: Aching Pain Intervention(s): Monitored during session;Repositioned    Home Living                      Prior Function            PT Goals (current goals can now be found in the care plan section) Acute Rehab PT Goals Patient Stated Goal: to get some rehab and get stronger  PT Goal Formulation: With patient  Time For Goal Achievement: 04/08/15 Potential to Achieve Goals: Good Progress towards PT goals: Progressing toward goals    Frequency  Min 5X/week    PT Plan Current plan remains appropriate    Co-evaluation             End of Session Equipment Utilized During Treatment: Gait  belt;Back brace Activity Tolerance: Patient tolerated treatment well Patient left: with call bell/phone within reach;in bed     Time: 9470-9628 PT Time Calculation (min) (ACUTE ONLY): 15 min  Charges:  $Gait Training: 8-22 mins                    G Codes:      Ellouise Newer Apr 24, 2015, 11:37 AM Elayne Snare, [place]

## 2015-03-26 NOTE — Progress Notes (Signed)
Occupational Therapy Treatment Patient Details Name: Ethan Rose MRN: [id number] DOB: 1946/02/11 Today's Date: 03/26/2015    History of present illness 69 y.o. s/p right Lumbar two-Three,Lumbar Three-Four,Lumbar Four-Five Anterior lateral lumbar interbody fusion (Right) and abdominal exposure.   OT comments  Pt progressing towards acute OT goals. Pt completed ADLs as detailed below. D/c plan remains appropriate. OT to continue to follow.   Follow Up Recommendations  SNF    Equipment Recommendations  Other (comment)    Recommendations for Other Services      Precautions / Restrictions Precautions Precautions: Back Precaution Booklet Issued: Yes (comment) Precaution Comments: reviewed Required Braces or Orthoses: Spinal Brace Spinal Brace: Thoracolumbosacral orthotic;Applied in supine position Restrictions Weight Bearing Restrictions: No       Mobility Bed Mobility Overal bed mobility: Needs Assistance Bed Mobility: Rolling;Sidelying to Sit Rolling: Min guard Sidelying to sit: Min assist       General bed mobility comments: Pt completed logrolling with vc for technique. Light min A to assist with powering up trunk to maintain precautions.   Transfers Overall transfer level: Needs assistance Equipment used: Rolling walker (2 wheeled) Transfers: Sit to/from Stand Sit to Stand: Min guard         General transfer comment: close min guard for safety. cues for technique with rw.    Balance Overall balance assessment: Needs assistance Sitting-balance support: Feet supported Sitting balance-Leahy Scale: Good     Standing balance support: Bilateral upper extremity supported;During functional activity Standing balance-Leahy Scale: Fair Standing balance comment: Pt able to static stand with no support to wash hands to sink                   ADL Overall ADL's : Needs assistance/impaired                         Toilet Transfer: Min  guard;Ambulation;RW (3n1 over toilet) Toilet Transfer Details (indicate cue type and reason): min guard for safety, cues for technique with rw Toileting- Clothing Manipulation and Hygiene: Minimal assistance;Sit to/from stand       Functional mobility during ADLs: Min guard;Rolling walker General ADL Comments: Pt completed bed mobility as detailed below. Pt then ambulated to bathroom and completed toilet transfer as detailed above. Reviewed LB dressing technique and compensatory tecvniques for ADLS.       Vision                     Perception     Praxis      Cognition   Behavior During Therapy: WFL for tasks assessed/performed Overall Cognitive Status: Within Functional Limits for tasks assessed                       Extremity/Trunk Assessment               Exercises     Shoulder Instructions       General Comments      Pertinent Vitals/ Pain       Pain Assessment: 0-10 Pain Score: 3  Pain Location: lower back Pain Descriptors / Indicators: Aching Pain Intervention(s): Limited activity within patient's tolerance;Repositioned  Home Living                                          Prior Functioning/Environment  Frequency Min 2X/week     Progress Toward Goals  OT Goals(current goals can now be found in the care plan section)  Progress towards OT goals: Progressing toward goals  Acute Rehab OT Goals Patient Stated Goal: to get some rehab and get stronger  OT Goal Formulation: With patient Time For Goal Achievement: 03/31/15 Potential to Achieve Goals: Good ADL Goals Pt Will Perform Lower Body Bathing: with min guard assist;with adaptive equipment;sit to/from stand Pt Will Perform Lower Body Dressing: with min guard assist;with adaptive equipment;sit to/from stand Pt Will Transfer to Toilet: with supervision;ambulating (3n1 over toilet) Pt Will Perform Toileting - Clothing Manipulation and hygiene: with  min guard assist;sit to/from stand Additional ADL Goal #1: Pt will independently verbalize 3/3 back precautions and maintain during ADLs/functional tasks. Additional ADL Goal #2: Caregiver will be independent with assisting pt with donning/doffing back brace.  Plan Discharge plan remains appropriate    Co-evaluation                 End of Session Equipment Utilized During Treatment: Back brace   Activity Tolerance Patient tolerated treatment well;No increased pain   Patient Left in chair;with call bell/phone within reach   Nurse Communication Mobility status        Time: 3662-9476 OT Time Calculation (min): 31 min  Charges: OT General Charges $OT Visit: 1 Procedure OT Treatments $Self Care/Home Management : 23-37 mins  Hortencia Pilar 03/26/2015, 10:16 AM

## 2015-03-27 ENCOUNTER — Inpatient Hospital Stay (HOSPITAL_COMMUNITY): Payer: Medicare Other | Admitting: Certified Registered Nurse Anesthetist

## 2015-03-27 ENCOUNTER — Encounter (HOSPITAL_COMMUNITY): Payer: Self-pay | Admitting: Certified Registered Nurse Anesthetist

## 2015-03-27 ENCOUNTER — Encounter (HOSPITAL_COMMUNITY)
Admission: RE | Disposition: A | Discharge status: Skilled Nursing Facility | Payer: Medicare Other | Source: Ambulatory Visit | Attending: Neurosurgery

## 2015-03-27 ENCOUNTER — Inpatient Hospital Stay (HOSPITAL_COMMUNITY): Admission: RE | Admit: 2015-03-27 | Payer: Medicare Other | Source: Ambulatory Visit | Admitting: Neurosurgery

## 2015-03-27 ENCOUNTER — Inpatient Hospital Stay (HOSPITAL_COMMUNITY): Payer: Medicare Other

## 2015-03-27 DIAGNOSIS — M419 Scoliosis, unspecified: Secondary | ICD-10-CM | POA: Diagnosis present

## 2015-03-27 HISTORY — PX: POSTERIOR LUMBAR FUSION 4 LEVEL: SHX6037

## 2015-03-27 LAB — GLUCOSE, CAPILLARY: GLUCOSE-CAPILLARY: 137 mg/dL — AB (ref 65–99)

## 2015-03-27 SURGERY — POSTERIOR LUMBAR FUSION 4 LEVEL
Anesthesia: General | Site: Back

## 2015-03-27 MED ORDER — GLYCOPYRROLATE 0.2 MG/ML IJ SOLN
INTRAMUSCULAR | Status: AC
  Filled 2015-03-27: qty 3

## 2015-03-27 MED ORDER — DEXTROSE 5 % IV SOLN: 500.0000 mg | Freq: Four times a day (QID) | INTRAVENOUS | Status: DC | PRN

## 2015-03-27 MED ORDER — LIDOCAINE-EPINEPHRINE 1 %-1:100000 IJ SOLN
INTRAMUSCULAR | Status: DC | PRN
  Administered 2015-03-27: 10 mL

## 2015-03-27 MED ORDER — CEFAZOLIN SODIUM-DEXTROSE 2-3 GM-% IV SOLR
INTRAVENOUS | Status: AC
Start: 2015-03-27 — End: 2015-03-27
  Filled 2015-03-27: qty 50

## 2015-03-27 MED ORDER — FENTANYL CITRATE (PF) 250 MCG/5ML IJ SOLN
INTRAMUSCULAR | Status: AC
Start: 2015-03-27 — End: 2015-03-27
  Filled 2015-03-27: qty 5

## 2015-03-27 MED ORDER — ARTIFICIAL TEARS OP OINT
TOPICAL_OINTMENT | OPHTHALMIC | Status: AC
  Filled 2015-03-27: qty 3.5

## 2015-03-27 MED ORDER — TRANEXAMIC ACID 1000 MG/10ML IV SOLN
1000.0000 mg | INTRAVENOUS | Status: AC
  Administered 2015-03-27: 1000 mg via INTRAVENOUS
  Filled 2015-03-27 (×2): qty 10

## 2015-03-27 MED ORDER — FENTANYL CITRATE (PF) 100 MCG/2ML IJ SOLN
INTRAMUSCULAR | Status: DC | PRN
  Administered 2015-03-27: 250 ug via INTRAVENOUS
  Administered 2015-03-27 (×9): 50 ug via INTRAVENOUS

## 2015-03-27 MED ORDER — ALUM & MAG HYDROXIDE-SIMETH 200-200-20 MG/5ML PO SUSP: 30.0000 mL | Freq: Four times a day (QID) | ORAL | Status: DC | PRN

## 2015-03-27 MED ORDER — GLYCOPYRROLATE 0.2 MG/ML IJ SOLN
INTRAMUSCULAR | Status: DC | PRN
  Administered 2015-03-27: 0.4 mg via INTRAVENOUS

## 2015-03-27 MED ORDER — EPHEDRINE SULFATE 50 MG/ML IJ SOLN
INTRAMUSCULAR | Status: AC
  Filled 2015-03-27: qty 1

## 2015-03-27 MED ORDER — ONDANSETRON HCL 4 MG/2ML IJ SOLN
INTRAMUSCULAR | Status: DC | PRN
  Administered 2015-03-27: 4 mg via INTRAVENOUS

## 2015-03-27 MED ORDER — SODIUM CHLORIDE 0.9 % IJ SOLN
3.0000 mL | Freq: Two times a day (BID) | INTRAMUSCULAR | Status: DC
  Administered 2015-03-27 – 2015-03-31 (×6): 3 mL via INTRAVENOUS

## 2015-03-27 MED ORDER — SURGIFOAM 100 EX MISC
CUTANEOUS | Status: DC | PRN
  Administered 2015-03-27: 09:00:00 via TOPICAL

## 2015-03-27 MED ORDER — NEOSTIGMINE METHYLSULFATE 10 MG/10ML IV SOLN
INTRAVENOUS | Status: DC | PRN
  Administered 2015-03-27: 3 mg via INTRAVENOUS

## 2015-03-27 MED ORDER — MIDAZOLAM HCL 5 MG/5ML IJ SOLN
INTRAMUSCULAR | Status: DC | PRN
  Administered 2015-03-27: 2 mg via INTRAVENOUS

## 2015-03-27 MED ORDER — ROCURONIUM BROMIDE 50 MG/5ML IV SOLN
INTRAVENOUS | Status: AC
  Filled 2015-03-27: qty 1

## 2015-03-27 MED ORDER — TRANEXAMIC ACID 1000 MG/10ML IV SOLN
2000.0000 mg | INTRAVENOUS | Status: DC | PRN
  Administered 2015-03-27: 2000 mg via TOPICAL

## 2015-03-27 MED ORDER — THROMBIN 5000 UNITS EX SOLR
OROMUCOSAL | Status: DC | PRN
  Administered 2015-03-27 (×5): via TOPICAL

## 2015-03-27 MED ORDER — LIDOCAINE HCL (CARDIAC) 20 MG/ML IV SOLN
INTRAVENOUS | Status: AC
  Filled 2015-03-27: qty 5

## 2015-03-27 MED ORDER — LACTATED RINGERS IV SOLN
INTRAVENOUS | Status: DC | PRN
  Administered 2015-03-27 (×2): via INTRAVENOUS

## 2015-03-27 MED ORDER — KCL IN DEXTROSE-NACL 20-5-0.45 MEQ/L-%-% IV SOLN
INTRAVENOUS | Status: DC
  Administered 2015-03-27 – 2015-03-29 (×4): via INTRAVENOUS
  Filled 2015-03-27 (×5): qty 1000

## 2015-03-27 MED ORDER — ACETAMINOPHEN 325 MG PO TABS: 650.0000 mg | ORAL_TABLET | ORAL | Status: DC | PRN

## 2015-03-27 MED ORDER — HYDROCODONE-ACETAMINOPHEN 5-325 MG PO TABS
1.0000 | ORAL_TABLET | ORAL | Status: DC | PRN
  Administered 2015-03-28 – 2015-04-01 (×15): 2 via ORAL
  Filled 2015-03-27 (×15): qty 2

## 2015-03-27 MED ORDER — CEFAZOLIN SODIUM-DEXTROSE 2-3 GM-% IV SOLR
INTRAVENOUS | Status: AC
  Filled 2015-03-27: qty 50

## 2015-03-27 MED ORDER — FENTANYL CITRATE (PF) 250 MCG/5ML IJ SOLN
INTRAMUSCULAR | Status: AC
  Filled 2015-03-27: qty 5

## 2015-03-27 MED ORDER — MIDAZOLAM HCL 2 MG/2ML IJ SOLN
INTRAMUSCULAR | Status: AC
  Filled 2015-03-27: qty 2

## 2015-03-27 MED ORDER — HYDROMORPHONE HCL 1 MG/ML IJ SOLN
0.5000 mg | INTRAMUSCULAR | Status: DC | PRN
  Administered 2015-03-27 – 2015-03-29 (×6): 1 mg via INTRAVENOUS
  Filled 2015-03-27 (×2): qty 1

## 2015-03-27 MED ORDER — 0.9 % SODIUM CHLORIDE (POUR BTL) OPTIME
TOPICAL | Status: DC | PRN
  Administered 2015-03-27 (×2): 1000 mL

## 2015-03-27 MED ORDER — PROPOFOL 10 MG/ML IV BOLUS
INTRAVENOUS | Status: AC
  Filled 2015-03-27: qty 20

## 2015-03-27 MED ORDER — VECURONIUM BROMIDE 10 MG IV SOLR
INTRAVENOUS | Status: DC | PRN
  Administered 2015-03-27 (×10): 1 mg via INTRAVENOUS

## 2015-03-27 MED ORDER — FLEET ENEMA 7-19 GM/118ML RE ENEM: 1.0000 | ENEMA | Freq: Once | RECTAL | Status: AC | PRN

## 2015-03-27 MED ORDER — ONDANSETRON HCL 4 MG/2ML IJ SOLN
INTRAMUSCULAR | Status: AC
  Filled 2015-03-27: qty 2

## 2015-03-27 MED ORDER — VECURONIUM BROMIDE 10 MG IV SOLR
INTRAVENOUS | Status: AC
  Filled 2015-03-27: qty 20

## 2015-03-27 MED ORDER — METHOCARBAMOL 500 MG PO TABS: 500.0000 mg | ORAL_TABLET | Freq: Four times a day (QID) | ORAL | Status: DC | PRN

## 2015-03-27 MED ORDER — MIDAZOLAM HCL 2 MG/2ML IJ SOLN: 0.5000 mg | Freq: Once | INTRAMUSCULAR | Status: DC | PRN

## 2015-03-27 MED ORDER — MENTHOL 3 MG MT LOZG: 1.0000 | LOZENGE | OROMUCOSAL | Status: DC | PRN

## 2015-03-27 MED ORDER — PROMETHAZINE HCL 25 MG/ML IJ SOLN: 6.2500 mg | INTRAMUSCULAR | Status: DC | PRN

## 2015-03-27 MED ORDER — ARTIFICIAL TEARS OP OINT
TOPICAL_OINTMENT | OPHTHALMIC | Status: AC
  Filled 2015-03-27: qty 10.5

## 2015-03-27 MED ORDER — PANTOPRAZOLE SODIUM 40 MG IV SOLR
40.0000 mg | Freq: Every day | INTRAVENOUS | Status: DC
  Administered 2015-03-27 – 2015-03-31 (×5): 40 mg via INTRAVENOUS
  Filled 2015-03-27 (×5): qty 40

## 2015-03-27 MED ORDER — SODIUM CHLORIDE 0.9 % IJ SOLN: 3.0000 mL | INTRAMUSCULAR | Status: DC | PRN

## 2015-03-27 MED ORDER — LIDOCAINE HCL (CARDIAC) 20 MG/ML IV SOLN
INTRAVENOUS | Status: DC | PRN
  Administered 2015-03-27: 20 mg via INTRAVENOUS

## 2015-03-27 MED ORDER — BISACODYL 10 MG RE SUPP
10.0000 mg | Freq: Every day | RECTAL | Status: DC | PRN
  Administered 2015-03-28 – 2015-04-01 (×3): 10 mg via RECTAL
  Filled 2015-03-27 (×3): qty 1

## 2015-03-27 MED ORDER — ZOLPIDEM TARTRATE 5 MG PO TABS: 5.0000 mg | ORAL_TABLET | Freq: Every evening | ORAL | Status: DC | PRN

## 2015-03-27 MED ORDER — SODIUM CHLORIDE 0.9 % IV SOLN
2000.0000 mg | INTRAVENOUS | Status: DC
  Filled 2015-03-27: qty 20

## 2015-03-27 MED ORDER — SODIUM CHLORIDE 0.9 % IV SOLN
INTRAVENOUS | Status: DC | PRN
  Administered 2015-03-27: 15:00:00

## 2015-03-27 MED ORDER — LACTATED RINGERS IV SOLN
INTRAVENOUS | Status: DC | PRN
  Administered 2015-03-27 (×4): via INTRAVENOUS

## 2015-03-27 MED ORDER — SUCCINYLCHOLINE CHLORIDE 20 MG/ML IJ SOLN
INTRAMUSCULAR | Status: AC
  Filled 2015-03-27: qty 1

## 2015-03-27 MED ORDER — ALBUMIN HUMAN 5 % IV SOLN
INTRAVENOUS | Status: DC | PRN
  Administered 2015-03-27: 11:00:00 via INTRAVENOUS

## 2015-03-27 MED ORDER — NEOSTIGMINE METHYLSULFATE 10 MG/10ML IV SOLN
INTRAVENOUS | Status: AC
  Filled 2015-03-27: qty 4

## 2015-03-27 MED ORDER — BUPIVACAINE HCL (PF) 0.5 % IJ SOLN
INTRAMUSCULAR | Status: DC | PRN
  Administered 2015-03-27: 10 mL

## 2015-03-27 MED ORDER — PHENOL 1.4 % MT LIQD: 1.0000 | OROMUCOSAL | Status: DC | PRN

## 2015-03-27 MED ORDER — ROCURONIUM BROMIDE 100 MG/10ML IV SOLN
INTRAVENOUS | Status: DC | PRN
  Administered 2015-03-27: 50 mg via INTRAVENOUS

## 2015-03-27 MED ORDER — HYDROMORPHONE HCL 1 MG/ML IJ SOLN: 0.2500 mg | INTRAMUSCULAR | Status: DC | PRN

## 2015-03-27 MED ORDER — PROPOFOL 10 MG/ML IV BOLUS
INTRAVENOUS | Status: DC | PRN
  Administered 2015-03-27: 120 mg via INTRAVENOUS

## 2015-03-27 MED ORDER — BUPIVACAINE LIPOSOME 1.3 % IJ SUSP
20.0000 mL | INTRAMUSCULAR | Status: AC
  Filled 2015-03-27: qty 20

## 2015-03-27 MED ORDER — MEPERIDINE HCL 25 MG/ML IJ SOLN: 6.2500 mg | INTRAMUSCULAR | Status: DC | PRN

## 2015-03-27 MED ORDER — OXYCODONE-ACETAMINOPHEN 5-325 MG PO TABS: 1.0000 | ORAL_TABLET | ORAL | Status: DC | PRN

## 2015-03-27 MED ORDER — SODIUM CHLORIDE 0.9 % IV SOLN
INTRAVENOUS | Status: DC | PRN
  Administered 2015-03-27: 14:00:00 via INTRAVENOUS

## 2015-03-27 MED ORDER — ONDANSETRON HCL 4 MG/2ML IJ SOLN
4.0000 mg | INTRAMUSCULAR | Status: DC | PRN
  Administered 2015-03-27 – 2015-03-28 (×2): 4 mg via INTRAVENOUS

## 2015-03-27 MED ORDER — STERILE WATER FOR INJECTION IJ SOLN
INTRAMUSCULAR | Status: AC
  Filled 2015-03-27: qty 10

## 2015-03-27 MED ORDER — SODIUM CHLORIDE 0.9 % IV SOLN: 250.0000 mL | INTRAVENOUS | Status: DC

## 2015-03-27 MED ORDER — POLYETHYLENE GLYCOL 3350 17 G PO PACK
17.0000 g | PACK | Freq: Every day | ORAL | Status: DC | PRN
  Administered 2015-03-29: 17 g via ORAL
  Filled 2015-03-27: qty 1

## 2015-03-27 MED ORDER — PHENYLEPHRINE HCL 10 MG/ML IJ SOLN
10.0000 mg | INTRAVENOUS | Status: DC | PRN
  Administered 2015-03-27: 10 ug/min via INTRAVENOUS

## 2015-03-27 MED ORDER — DOCUSATE SODIUM 100 MG PO CAPS
100.0000 mg | ORAL_CAPSULE | Freq: Two times a day (BID) | ORAL | Status: DC
  Administered 2015-03-27 – 2015-04-01 (×10): 100 mg via ORAL
  Filled 2015-03-27 (×9): qty 1

## 2015-03-27 MED ORDER — ACETAMINOPHEN 650 MG RE SUPP: 650.0000 mg | RECTAL | Status: DC | PRN

## 2015-03-27 MED ORDER — EPHEDRINE SULFATE 50 MG/ML IJ SOLN
INTRAMUSCULAR | Status: DC | PRN
  Administered 2015-03-27 (×2): 10 mg via INTRAVENOUS

## 2015-03-27 MED ORDER — ARTIFICIAL TEARS OP OINT
TOPICAL_OINTMENT | OPHTHALMIC | Status: DC | PRN
  Administered 2015-03-27: 1 via OPHTHALMIC

## 2015-03-27 MED ORDER — CEFAZOLIN SODIUM-DEXTROSE 2-3 GM-% IV SOLR
2.0000 g | Freq: Three times a day (TID) | INTRAVENOUS | Status: AC
  Administered 2015-03-27 – 2015-03-28 (×2): 2 g via INTRAVENOUS
  Filled 2015-03-27 (×2): qty 50

## 2015-03-27 MED FILL — Heparin Sodium (Porcine) Inj 1000 Unit/ML: INTRAMUSCULAR | Qty: 30 | Status: AC

## 2015-03-27 MED FILL — Sodium Chloride IV Soln 0.9%: INTRAVENOUS | Qty: 2000 | Status: AC

## 2015-03-27 SURGICAL SUPPLY — 88 items
ADH SKN CLS APL DERMABOND .7 (GAUZE/BANDAGES/DRESSINGS) ×3
APL SKNCLS STERI-STRIP NONHPOA (GAUZE/BANDAGES/DRESSINGS)
BAG DECANTER FOR FLEXI CONT (MISCELLANEOUS) ×2 IMPLANT
BENZOIN TINCTURE PRP APPL 2/3 (GAUZE/BANDAGES/DRESSINGS) IMPLANT
BLADE CLIPPER SURG (BLADE) ×2 IMPLANT
BUR MATCHSTICK NEURO 3.0 LAGG (BURR) ×3 IMPLANT
BUR PRECISION FLUTE 5.0 (BURR) ×3 IMPLANT
CANISTER SUCT 3000ML PPV (MISCELLANEOUS) ×3 IMPLANT
CLOSURE WOUND 1/2 X4 (GAUZE/BANDAGES/DRESSINGS)
CONNECTOR RELINE 30MM OPEN OFF (Connector) ×4 IMPLANT
CONT SPEC 4OZ CLIKSEAL STRL BL (MISCELLANEOUS) ×6 IMPLANT
COVER BACK TABLE 60X90IN (DRAPES) ×3 IMPLANT
DECANTER SPIKE VIAL GLASS SM (MISCELLANEOUS) ×1 IMPLANT
DERMABOND ADVANCED (GAUZE/BANDAGES/DRESSINGS) ×6
DERMABOND ADVANCED .7 DNX12 (GAUZE/BANDAGES/DRESSINGS) ×1 IMPLANT
DRAPE C-ARM 42X72 X-RAY (DRAPES) ×6 IMPLANT
DRAPE C-ARMOR (DRAPES) IMPLANT
DRAPE LAPAROTOMY 100X72X124 (DRAPES) ×3 IMPLANT
DRAPE POUCH INSTRU U-SHP 10X18 (DRAPES) ×3 IMPLANT
DRAPE SURG 17X23 STRL (DRAPES) ×3 IMPLANT
DRSG OPSITE POSTOP 4X10 (GAUZE/BANDAGES/DRESSINGS) ×4 IMPLANT
DRSG TELFA 3X8 NADH (GAUZE/BANDAGES/DRESSINGS) IMPLANT
DURAPREP 26ML APPLICATOR (WOUND CARE) ×3 IMPLANT
ELECT BLADE 4.0 EZ CLEAN MEGAD (MISCELLANEOUS) ×6
ELECT CAUTERY BLADE 6.4 (BLADE) ×2 IMPLANT
ELECT REM PT RETURN 9FT ADLT (ELECTROSURGICAL) ×3
ELECTRODE BLDE 4.0 EZ CLN MEGD (MISCELLANEOUS) IMPLANT
ELECTRODE REM PT RTRN 9FT ADLT (ELECTROSURGICAL) ×1 IMPLANT
EVACUATOR 1/8 PVC DRAIN (DRAIN) ×2 IMPLANT
GAUZE SPONGE 4X4 12PLY STRL (GAUZE/BANDAGES/DRESSINGS) ×3 IMPLANT
GAUZE SPONGE 4X4 16PLY XRAY LF (GAUZE/BANDAGES/DRESSINGS) ×10 IMPLANT
GLOVE BIO SURGEON STRL SZ8 (GLOVE) ×6 IMPLANT
GLOVE BIOGEL PI IND STRL 8 (GLOVE) ×2 IMPLANT
GLOVE BIOGEL PI IND STRL 8.5 (GLOVE) ×2 IMPLANT
GLOVE BIOGEL PI INDICATOR 8 (GLOVE) ×10
GLOVE BIOGEL PI INDICATOR 8.5 (GLOVE) ×4
GLOVE ECLIPSE 7.5 STRL STRAW (GLOVE) ×8 IMPLANT
GLOVE ECLIPSE 8.0 STRL XLNG CF (GLOVE) ×6 IMPLANT
GOWN STRL REUS W/ TWL LRG LVL3 (GOWN DISPOSABLE) IMPLANT
GOWN STRL REUS W/ TWL XL LVL3 (GOWN DISPOSABLE) ×2 IMPLANT
GOWN STRL REUS W/TWL 2XL LVL3 (GOWN DISPOSABLE) ×6 IMPLANT
GOWN STRL REUS W/TWL LRG LVL3 (GOWN DISPOSABLE) ×3
GOWN STRL REUS W/TWL XL LVL3 (GOWN DISPOSABLE) ×6
HEMOSTAT POWDER KIT SURGIFOAM (HEMOSTASIS) ×10 IMPLANT
KIT BASIN OR (CUSTOM PROCEDURE TRAY) ×3 IMPLANT
KIT INFUSE LRG II (Orthopedic Implant) ×2 IMPLANT
KIT POSITION SURG JACKSON T1 (MISCELLANEOUS) ×3 IMPLANT
KIT ROOM TURNOVER OR (KITS) ×3 IMPLANT
MARKER SKIN DUAL TIP RULER LAB (MISCELLANEOUS) ×2 IMPLANT
MILL MEDIUM DISP (BLADE) ×3 IMPLANT
NDL HYPO 25X1 1.5 SAFETY (NEEDLE) ×1 IMPLANT
NDL SPNL 18GX3.5 QUINCKE PK (NEEDLE) IMPLANT
NEEDLE HYPO 25X1 1.5 SAFETY (NEEDLE) ×3 IMPLANT
NEEDLE SPNL 18GX3.5 QUINCKE PK (NEEDLE) IMPLANT
NS IRRIG 1000ML POUR BTL (IV SOLUTION) ×5 IMPLANT
PACK FOAM VITOSS 10CC (Orthopedic Implant) ×8 IMPLANT
PACK LAMINECTOMY NEURO (CUSTOM PROCEDURE TRAY) ×3 IMPLANT
PACK TRANSFER 300ML (TOM) (MISCELLANEOUS) ×2 IMPLANT
PAD ARMBOARD 7.5X6 YLW CONV (MISCELLANEOUS) ×9 IMPLANT
PAD DRESSING TELFA 3X8 NADH (GAUZE/BANDAGES/DRESSINGS) IMPLANT
PATTIES SURGICAL .5 X.5 (GAUZE/BANDAGES/DRESSINGS) IMPLANT
PATTIES SURGICAL .5 X1 (DISPOSABLE) IMPLANT
PATTIES SURGICAL 1X1 (DISPOSABLE) ×4 IMPLANT
ROD RELINE 0-0 CON M 5.0/6.0MM (Rod) ×4 IMPLANT
ROD RELINE 5.5X500MM STRAIGHT (Rod) ×4 IMPLANT
SCREW LOCK RELINE 5.5 TULIP (Screw) ×44 IMPLANT
SCREW LOCK RELINE CLOSED TULIP (Screw) ×4 IMPLANT
SCREW RELINE-O 8.5X100MM CLSD (Screw) ×2 IMPLANT
SCREW RELINE-O 8.5X90MM CLSD (Screw) ×2 IMPLANT
SCREW RELINE-O POLY 6.5X40 (Screw) ×4 IMPLANT
SCREW RELINE-O POLY 7.5X40 (Screw) ×12 IMPLANT
SCREW RELINE-O POLY 7.5X45 (Screw) ×16 IMPLANT
SPONGE LAP 4X18 X RAY DECT (DISPOSABLE) ×2 IMPLANT
SPONGE SURGIFOAM ABS GEL 100 (HEMOSTASIS) ×3 IMPLANT
STAPLER SKIN PROX WIDE 3.9 (STAPLE) IMPLANT
STRIP CLOSURE SKIN 1/2X4 (GAUZE/BANDAGES/DRESSINGS) ×1 IMPLANT
SUT VIC AB 1 CT1 18XBRD ANBCTR (SUTURE) ×2 IMPLANT
SUT VIC AB 1 CT1 8-18 (SUTURE) ×12
SUT VIC AB 2-0 CT1 18 (SUTURE) ×6 IMPLANT
SUT VIC AB 3-0 SH 8-18 (SUTURE) ×10 IMPLANT
SYR 20CC LL (SYRINGE) ×3 IMPLANT
SYR 3ML LL SCALE MARK (SYRINGE) ×12 IMPLANT
SYR 5ML LL (SYRINGE) IMPLANT
TOWEL OR 17X24 6PK STRL BLUE (TOWEL DISPOSABLE) ×3 IMPLANT
TOWEL OR 17X26 10 PK STRL BLUE (TOWEL DISPOSABLE) ×3 IMPLANT
TRAP SPECIMEN MUCOUS 40CC (MISCELLANEOUS) ×3 IMPLANT
TRAY FOLEY CATH 14FRSI W/METER (CATHETERS) ×3 IMPLANT
WATER STERILE IRR 1000ML POUR (IV SOLUTION) ×3 IMPLANT

## 2015-03-27 NOTE — OR Nursing (Signed)
14 foley catheter inserted, with MD supervision, patient has hypospadias

## 2015-03-27 NOTE — Anesthesia Postprocedure Evaluation (Signed)
  Anesthesia Post-op Note  Patient: Ethan Rose  Procedure(s) Performed: Procedure(s) with comments: Posterior pedicle subtraction osteotomy with T10 to Iliac fusion with Dr. [name] assisting (N/A) - Posterior pedicle subtraction osteotomy with T10 to Iliac fusion with Dr. [name] assisting  Patient Location: PACU  Anesthesia Type:General  Level of Consciousness: awake, alert , oriented and patient cooperative  Airway and Oxygen Therapy: Patient Spontanous Breathing and Patient connected to nasal cannula oxygen  Post-op Pain: mild  Post-op Assessment: Post-op Vital signs reviewed, Patient's Cardiovascular Status Stable, Respiratory Function Stable, Patent Airway, No signs of Nausea or vomiting and Pain level controlled  Post-op Vital Signs: Reviewed and stable  Last Vitals:  Filed Vitals:   03/27/15 1545  BP: 129/79  Pulse: 76  Temp:   Resp: 29    Complications: No apparent anesthesia complications

## 2015-03-27 NOTE — Anesthesia Procedure Notes (Signed)
Procedure Name: Intubation Date/Time: 03/27/2015 7:45 AM Performed by: Garrison Columbus T Pre-anesthesia Checklist: Patient identified, Emergency Drugs available, Suction available and Patient being monitored Patient Re-evaluated:Patient Re-evaluated prior to inductionOxygen Delivery Method: Circle system utilized Preoxygenation: Pre-oxygenation with 100% oxygen Intubation Type: IV induction Ventilation: Mask ventilation without difficulty and Oral airway inserted - appropriate to patient size Laryngoscope Size: Sabra Heck and 2 Grade View: Grade I Tube type: Oral Tube size: 8.0 mm Number of attempts: 1 Airway Equipment and Method: Stylet and Oral airway Placement Confirmation: ETT inserted through vocal cords under direct vision,  positive ETCO2 and breath sounds checked- equal and bilateral Secured at: 23 cm Tube secured with: Tape Dental Injury: Teeth and Oropharynx as per pre-operative assessment

## 2015-03-27 NOTE — Progress Notes (Signed)
Awake, alert, conversant.  Full strength both lower extremities.  Doing well.

## 2015-03-27 NOTE — Clinical Social Work Note (Signed)
CSW reviewed chart. Pt will presented for surgery today. Once pt is medically stable he can transition to [facility]. CSW will continue to follow and assist with discharge.   [place], MSW, [place]

## 2015-03-27 NOTE — H&P (View-Only) (Signed)
Subjective: Patient reports feeling better with less leg weakness  Objective: Vital signs in last 24 hours: Temp:  [98.1 F (36.7 C)-98.8 F (37.1 C)] 98.1 F (36.7 C) (05/12 1033) Pulse Rate:  [61-72] 72 (05/12 1033) Resp:  [18-20] 20 (05/12 1033) BP: (115-134)/(56-66) 134/66 mmHg (05/12 1033) SpO2:  [93 %-100 %] 95 % (05/12 1033)  Intake/Output from previous day: 05/11 0701 - 05/12 0700 In: 3 [I.V.:3] Out: 550 [Urine:550] Intake/Output this shift:    Physical Exam: Full strength bilateral HFs, PF, DF.  Mild abdominal distension, nontender.  Lab Results: No results for input(s): WBC, HGB, HCT, PLT in the last 72 hours. BMET No results for input(s): NA, K, CL, CO2, GLUCOSE, BUN, CREATININE, CALCIUM in the last 72 hours.  Studies/Results: Dg Thoracolumabar Spine  03/25/2015   CLINICAL DATA:  Scoliosis, lumbar radiculopathy  EXAM: THORACOLUMBAR SPINE 1V  COMPARISON:  10/01/2014  FINDINGS: Limited evaluation secondary to technique.  There is a chronic T12, L1 and L2 vertebral body compression fracture. There is 2-3 mm of retrolisthesis of L2 on L3. There is likely grade 1 anterolisthesis of L5 on S1. There is no acute fracture or static listhesis. There are interbody spacer devices at L2-3, L3-4 and L4-5.  The visualized portions of the lungs are clear.  There is no significant curvature greater than 5 degrees. Thoracolumbar kyphosis centered at L1. There is a minimal levocurvature of the lumbar spine. There are no intrinsic vertebral anomalies. No significant pelvic tilt. Soft tissues are unremarkable.  IMPRESSION: No significant thoracolumbar curvature greater than 5 degrees. 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   Electronically Signed   By: Kathreen Devoid   On: 03/25/2015 12:57   Dg Lumbar Spine 2-3 Views  03/24/2015   CLINICAL DATA:  Lumbar spine surgery.  EXAM: LUMBAR SPINE - 2-3 VIEW  COMPARISON:  09/30/2014.  FINDINGS: Interbody fusion of multiple lumbar discs. Lumbar  vertebra cannot be counted on single AP view.  IMPRESSION: Interbody fusion of multiple lumbar vertebra.   Electronically Signed   By: Marcello Moores  Register   On: 03/24/2015 14:04   Dg C-arm Gt 120 Min  03/24/2015   CLINICAL DATA:  Perioperative localization for lumbar spine fusion flow idiopathic scoliosis  EXAM: DG C-ARM GT 120 MIN  FLUOROSCOPY TIME:  Radiation Exposure Index (as provided by the fluoroscopic device):  If the device does not provide the exposure index:  Fluoroscopy Time (in minutes and seconds):  2 minutes 53 seconds  Number of Acquired Images:  COMPARISON:  None  FINDINGS: C-arm fluoroscopy was provided for up to 120 minutes during lumbar laminectomy and interbody fusion .  IMPRESSION: C-arm fluoroscopy provided.   Electronically Signed   By: Ivar Drape M.D.   On: 03/24/2015 14:12    Assessment/Plan: Patient doing well.  Will work on bowels.  Ready for second stage surgery tomorrow.    LOS: 2 days    Peggyann Shoals, MD 03/26/2015, 12:06 PM

## 2015-03-27 NOTE — Brief Op Note (Addendum)
03/24/2015 - 03/27/2015  3:06 PM  PATIENT:  Ethan Rose  69 y.o. male  PRE-OPERATIVE DIAGNOSIS:  Idiopathic scoliosis, Spondylolisthesis, Lumbosacral region; thoracolumbar kyphotic deformity, Radiculopathy, Lumbar region.  POST-OPERATIVE DIAGNOSIS:  Idiopathic scoliosis, Spondylolisthesis, Lumbosacral region; thoracolumbar kyphotic deformity, Radiculopathy, Lumbar region.   PROCEDURE:  Procedure(s) with comments: Posterior pedicle subtraction osteotomy L 1 with T10 to Iliac fusion with pedicle screw fixation and posterolateral arthrodesis - Posterior pedicle subtraction osteotomy with T10 to Iliac fusion with Dr. [name] assisting  SURGEON:  Surgeon(s) and Role:    * [name], MD - Primary  PHYSICIAN ASSISTANT:   ASSISTANTS: [name]   ANESTHESIA:   general  EBL:  Total I/O In: 6100 [I.V.:5300; Blood:300; IV Piggyback:500] Out: 9753 [Urine:875; Blood:900]  BLOOD ADMINISTERED:300 CC PRBC  DRAINS: (Medium) Hemovact drain(s) in the epidural space with  Suction Open   LOCAL MEDICATIONS USED:  MARCAINE    and LIDOCAINE   SPECIMEN:  No Specimen  DISPOSITION OF SPECIMEN:  N/A  COUNTS:  YES  TOURNIQUET:  * No tourniquets in log *  Dictation:  See Op Note  PLAN OF CARE: Admit to inpatient   PATIENT DISPOSITION:  PACU - hemodynamically stable.   Delay start of Pharmacological VTE agent (>24hrs) due to surgical blood loss or risk of bleeding: yes

## 2015-03-27 NOTE — Transfer of Care (Signed)
Immediate Anesthesia Transfer of Care Note  Patient: Ethan Rose  Procedure(s) Performed: Procedure(s) with comments: Posterior pedicle subtraction osteotomy with T10 to Iliac fusion with Dr. [name] assisting (N/A) - Posterior pedicle subtraction osteotomy with T10 to Iliac fusion with Dr. [name] assisting  Patient Location: PACU  Anesthesia Type:General  Level of Consciousness: awake and alert   Airway & Oxygen Therapy: Patient Spontanous Breathing and Patient connected to nasal cannula oxygen  Post-op Assessment: Report given to RN, Post -op Vital signs reviewed and stable and Patient moving all extremities X 4  Post vital signs: Reviewed and stable  Last Vitals:  Filed Vitals:   03/27/15 0622  BP: 146/79  Pulse: 65  Temp: 37 C  Resp: 18    Complications: No apparent anesthesia complications

## 2015-03-27 NOTE — Anesthesia Preprocedure Evaluation (Addendum)
Anesthesia Evaluation  Patient identified by MRN, date of birth, ID band Patient awake    Reviewed: Allergy & Precautions, NPO status , Patient's Chart, lab work & pertinent test results  History of Anesthesia Complications Negative for: history of anesthetic complications  Airway Mallampati: II  TM Distance: >3 FB Neck ROM: Full    Dental  (+) Dental Advisory Given, Chipped   Pulmonary former smoker,  breath sounds clear to auscultation        Cardiovascular - anginanegative cardio ROS  Rhythm:Regular Rate:Normal     Neuro/Psych Chronic back pain, walks with cane    GI/Hepatic Neg liver ROS, GERD-  Medicated and Controlled,  Endo/Other  negative endocrine ROS  Renal/GU negative Renal ROS     Musculoskeletal  (+) Arthritis -,   Abdominal   Peds  Hematology negative hematology ROS (+)   Anesthesia Other Findings   Reproductive/Obstetrics                          Anesthesia Physical Anesthesia Plan  ASA: II  Anesthesia Plan: General   Post-op Pain Management:    Induction: Intravenous  Airway Management Planned: Oral ETT  Additional Equipment: Arterial line  Intra-op Plan:   Post-operative Plan: Extubation in OR  Informed Consent: I have reviewed the patients History and Physical, chart, labs and discussed the procedure including the risks, benefits and alternatives for the proposed anesthesia with the patient or authorized representative who has indicated his/her understanding and acceptance.   Dental advisory given  Plan Discussed with: CRNA, Anesthesiologist and Surgeon  Anesthesia Plan Comments: (Plan routine monitors, A line for sampling purposes, GETA)       Anesthesia Quick Evaluation

## 2015-03-27 NOTE — Op Note (Addendum)
03/24/2015 - 03/27/2015  3:06 PM  PATIENT:  Ethan Rose  69 y.o. male  PRE-OPERATIVE DIAGNOSIS:  Idiopathic scoliosis, Spondylolisthesis, Lumbosacral region; thoracolumbar kyphotic deformity, Radiculopathy, Lumbar region.  POST-OPERATIVE DIAGNOSIS:  Idiopathic scoliosis, Spondylolisthesis, Lumbosacral region; thoracolumbar kyphotic deformity, Radiculopathy, Lumbar region.   PROCEDURE:  Procedure(s) with comments: Posterior pedicle subtraction osteotomy L 1 with T10 to Iliac fusion with pedicle screw fixation and posterolateral arthrodesis - Posterior pedicle subtraction osteotomy with T10 to Iliac fusion with Dr. [name] assisting  SURGEON:  Surgeon(s) and Role:    * [name], MD - Primary  PHYSICIAN ASSISTANT:   ASSISTANTS: [name]   ANESTHESIA:   general  EBL:  Total I/O In: 37 [I.V.:5300; Blood:300; IV Piggyback:500] Out: 0347 [Urine:875; Blood:900]  BLOOD ADMINISTERED:300 CC PRBC  DRAINS: (Medium) Hemovact drain(s) in the epidural space with  Suction Open   LOCAL MEDICATIONS USED:  MARCAINE    and LIDOCAINE   SPECIMEN:  No Specimen  DISPOSITION OF SPECIMEN:  N/A  COUNTS:  YES  TOURNIQUET:  * No tourniquets in log *  DICTATION: Patient is 69 year old man with kyphotic deformity with apex at L 1 with exaggerated lumbar lordosis and multilevel retrolisthesis with severe stenosis and bilateral lower extremity weakness.  Patient has a long history of severe back and bilateral leg pain and weakness.  It was elected to take him to surgery for fusion from T 10 through ilium with pedicle subtraction osteotomy of L 1.  This is the second stage of a planned two stage surgical procedure.  The first part involved XLIF at L 23, L 34, L 45 levels with correction of listhesis and stenosis.  Procedure: Patient was placed in a prone position on the [place] table  after smooth and uncomplicated induction of general endotracheal anesthesia. His back was shaved and prepped and  draped in usual sterile fashion with betadine scrub and DuraPrep. Area of incision was infiltrated with local lidocaine. Incision was made to the lumbodorsal fascia was incised and exposure was performed of the T 10 to the ilium bilaterally.  Facetectomies were performed at each level to facilitate correction of spinal deformity.  Pedicle screws were placed with X ray confirmation at T 10 (6.5 x 40), T 11 (7.5 x 40), same at T 12 and L 2 levels, L3 , L 4, L 5, S 1 levels ( 7.5 x 45).  Bilateral iliac fixation was performed through separate fascial incisions after recessing the screws and obtaining bone for grafting from the iliac crests (8.5 x 100 on the left and 8.5 x 90 on the right).    The posterolateral region was extensively decorticated bilaterally.    At this point, a three column osteotomy of L 1 was performed using Nuvasive system and both pedicles of L 1 were resected along with the vertebra, lateral body and posterior bone abutting the spinal canal after performing complete removal of posterior elements with laminectomy from T 12 to L2 level.  Vertebral body removal was performed to the anterior cortex.  Using sequential compression, the kyphotic deformity was corrected after PSO was performed.     Autograft was harvested from both ilia for use in fusion along with marrow rich blood. Rods were cut, bent and locked down bilaterally with compression across the PSO and correction of kyphotic deformity.  An additional stabilizing rod was affixed to the right sided rod for additional rigidity across the PSO.  The posterolateral region was packed with autograft, large BMP, Vitoss soaked in [employer]  blood and autograft.. The wound was irrigated. A medium Hemovac drain was placed.Fascia was closed with 1 Vicryl sutures skin edges were reapproximated 2 and 3-0 Vicryl sutures. The wound was dressed with Dermabond and an occlusive dressing.  the patient was extubated in the operating room and taken to  recovery in stable satisfactory condition he tolerated traction well counts were correct at the end of the case.  We were able to restore sagittal balance an to correct the patient's fixed kyphotic deformity, with 25 degrees of correction across the PSO.  PLAN OF CARE: Admit to inpatient   PATIENT DISPOSITION:  PACU - hemodynamically stable.   Delay start of Pharmacological VTE agent (>24hrs) due to surgical blood loss or risk of bleeding: yes

## 2015-03-27 NOTE — Interval H&P Note (Signed)
History and Physical Interval Note:  03/27/2015 7:17 AM  Ethan Rose  has presented today for surgery, with the diagnosis of Idiopathic scoliosis, Spondylolisthesis, Lumbosacral region; Radiculopathy, Lumbar region.  The various methods of treatment have been discussed with the patient and family. After consideration of risks, benefits and other options for treatment, the patient has consented to  Procedure(s) with comments: Posterior pedicle subtraction osteotomy with T10 to Iliac fusion with Dr. [name] assisting (N/A) - Posterior pedicle subtraction osteotomy with T10 to Iliac fusion with Dr. [name] assisting as a surgical intervention .  The patient's history has been reviewed, patient examined, no change in status, stable for surgery.  I have reviewed the patient's chart and labs.  Questions were answered to the patient's satisfaction.     Ethan Rose D

## 2015-03-28 NOTE — Progress Notes (Signed)
Subjective: Patient reports appropriate back soreness. Denies leg pain. Denies weakness or numbness or tingling. He is having no flatus and abdomen is distended but not tender or painful. No nausea or vomiting.  Objective: Vital signs in last 24 hours: Temp:  [98 F (36.7 C)-98.7 F (37.1 C)] 98.7 F (37.1 C) (05/14 0536) Pulse Rate:  [76-99] 83 (05/14 0536) Resp:  [12-29] 20 (05/14 0536) BP: (115-142)/(68-88) 116/76 mmHg (05/14 0536) SpO2:  [94 %-97 %] 95 % (05/14 0536)  Intake/Output from previous day: 05/13 0730 - 05/14 0729 In: 6350 [I.V.:5550; Blood:300; IV Piggyback:500] Out: 2670 [Urine:1300; Drains:470; Blood:900] Intake/Output this shift:    Neurologic: Grossly normal  Lab Results: Lab Results  Component Value Date   WBC 8.5 03/16/2015   HGB 15.1 03/16/2015   HCT 45.3 03/16/2015   MCV 91.0 03/16/2015   PLT 240 03/16/2015   No results found for: INR, PROTIME BMET Lab Results  Component Value Date   NA 139 03/16/2015   K 4.4 03/16/2015   CL 103 03/16/2015   CO2 26 03/16/2015   GLUCOSE 96 03/16/2015   BUN 13 03/16/2015   CREATININE 0.49* 03/16/2015   CALCIUM 9.4 03/16/2015    Studies/Results: Dg Lumbar Spine Complete  03/27/2015   CLINICAL DATA:  T10-iliac fusion.  EXAM: LUMBAR SPINE - COMPLETE 4+ VIEW  COMPARISON:  09/30/2014  FINDINGS: Examination demonstrates moderate spondylosis of the thoracolumbar spine. Subtle retrolisthesis of L2 on L3 and L3 on L4 unchanged. Intervertebral spacers at the L2-3, L3-4 L4-5 levels. Stable mild anterior wedging of T12-L2. Placement of posterior fusion hardware from T10 to the S1 level and iliac bones intact. Remainder of the exam is unchanged.  IMPRESSION: Placement of posterior fusion hardware from T10 to the S1 level and iliac bones intact. Other chronic stable changes as described.   Electronically Signed   By: Marin Olp M.D.   On: 03/27/2015 14:42    Assessment/Plan: Overall making the expected recovery. I think  he has an ileus and I have asked him to not to eat any solid food until he has flatus. Try to mobilize and brace today.   LOS: 4 days    Melisssa Donner,Sayeed S 03/28/2015, 8:03 AM

## 2015-03-28 NOTE — Progress Notes (Signed)
OT Cancellation Note  Patient Details Name: Ethan Rose MRN: [id number] DOB: 1945/12/01   Cancelled Treatment:    Reason Eval/Treat Not Completed: Other (comment) (Pt declined due to fatigue, pain. ) Pt reports recently returning to bed after sitting in the recliner for awhile. OT to reattempt as schedule permits.  Hortencia Pilar 03/28/2015, 2:48 PM

## 2015-03-28 NOTE — Evaluation (Signed)
Physical Therapy Re-Evaluation Patient Details Name: Ethan Rose MRN: [id number] DOB: Nov 24, 1945 Today's Date: 03/28/2015   History of Present Illness  69 y.o. s/p right Lumbar two-Three,Lumbar Three-Four,Lumbar Four-Five Anterior lateral lumbar interbody fusion (Right) and abdominal exposure. s/p Posterior pedicle subtraction osteotomy with T10 to Iliac fusion 5/13. (part of a 2 step decompression)  Clinical Impression  Patient re-evaluated following the additional surgical procedure he underwent 5/13 as stated above. Pt with decreased functional ability from previous follow-up. Significant weakness of hip flexors Lt>Rt. Mod assist for bed mobility and min assist to transfer to chair. Unable to void with notable scrotal and penile edema (RN notified.) Mr. Newbern will greatly benefit from continued physical therapy acutely while in the hospital. Goals updated appropriately and I recommend patient receive SNF level care at d/c as he lives alone and will need further rehab to recover his functional independence prior to returning home.    Follow Up Recommendations SNF;Supervision/Assistance - 24 hour    Equipment Recommendations  Rolling walker with 5" wheels    Recommendations for Other Services OT consult     Precautions / Restrictions Precautions Precautions: Back Precaution Comments: reviewed Required Braces or Orthoses: Spinal Brace Spinal Brace: Thoracolumbosacral orthotic;Applied in sitting position Restrictions Weight Bearing Restrictions: No      Mobility  Bed Mobility Overal bed mobility: Needs Assistance Bed Mobility: Rolling;Sidelying to Sit Rolling: Min assist Sidelying to sit: Mod assist       General bed mobility comments: Min assist to facilitate roll while maintaining back precautions. performed towards pts left side. Mod assist for truncal support to rise with increase in pain. Needs assist to scoot to edge of bed.  Transfers Overall transfer level:  Needs assistance Equipment used: Rolling walker (2 wheeled) Transfers: Sit to/from [facility] Sit to Stand: Min assist Stand pivot transfers: Min assist       General transfer comment: Min assist for boost and balance, difficulty rising. Min assist for balance with pivot to chair. Some difficulty lifting LLE but no significant buckling noted.  Ambulation/Gait                Stairs            Wheelchair Mobility    Modified Rankin (Stroke Patients Only)       Balance Overall balance assessment: Needs assistance Sitting-balance support: No upper extremity supported;Feet supported Sitting balance-Leahy Scale: Fair     Standing balance support: Single extremity supported Standing balance-Leahy Scale: Poor                               Pertinent Vitals/Pain Pain Assessment: 0-10 Pain Score: 8  Pain Location: back Pain Descriptors / Indicators: Aching;Dull Pain Intervention(s): Monitored during session;Repositioned    Home Living Family/patient expects to be discharged to:: Skilled nursing facility Living Arrangements: Alone   Type of Home: House Home Access: Stairs to enter Entrance Stairs-Rails: None Entrance Stairs-Number of Steps: 2 Home Layout: Able to live on main level with bedroom/bathroom Home Equipment: Cane - quad Additional Comments: walk in shower, with bench, standard height toilets    Prior Function Level of Independence: Independent with assistive device(s)         Comments: quad cane     Hand Dominance   Dominant Hand: Right    Extremity/Trunk Assessment   Upper Extremity Assessment: Defer to OT evaluation           Lower Extremity  Assessment: Generalized weakness;RLE deficits/detail;LLE deficits/detail RLE Deficits / Details: unable to flex hip against gravity approx 2+/5 LLE Deficits / Details: unable to flex hip against gravity approx 2-/5. Lt knee extension 4-/5     Communication    Communication: No difficulties  Cognition Arousal/Alertness: Awake/alert Behavior During Therapy: WFL for tasks assessed/performed Overall Cognitive Status: Within Functional Limits for tasks assessed                      General Comments General comments (skin integrity, edema, etc.): Pt attempted to urinate, notable penile and scrotal edema. RN notified.    Exercises General Exercises - Lower Extremity Ankle Circles/Pumps: AROM;Both;10 reps;Seated Long Arc Quad: Strengthening;Both;5 reps;Seated      Assessment/Plan    PT Assessment Patient needs continued PT services  PT Diagnosis Difficulty walking;Acute pain   PT Problem List Decreased strength;Decreased range of motion;Decreased activity tolerance;Decreased balance;Decreased mobility;Decreased coordination;Decreased knowledge of precautions;Pain;Decreased knowledge of use of DME  PT Treatment Interventions DME instruction;Gait training;Functional mobility training;Therapeutic activities;Therapeutic exercise;Balance training;Patient/family education   PT Goals (Current goals can be found in the Care Plan section) Acute Rehab PT Goals Patient Stated Goal: to get some rehab and get stronger  PT Goal Formulation: With patient Time For Goal Achievement: 04/11/15 Potential to Achieve Goals: Good    Frequency Min 5X/week   Barriers to discharge Decreased caregiver support      Co-evaluation               End of Session Equipment Utilized During Treatment: Gait belt;Back brace Activity Tolerance: Patient limited by pain;Patient limited by fatigue Patient left: in chair;with call bell/phone within reach Nurse Communication: Mobility status (Penile/scrotal edema)         Time: 4742-5956 PT Time Calculation (min) (ACUTE ONLY): 37 min   Charges:   PT Evaluation $PT Re-evaluation: 1 Procedure PT Treatments $Therapeutic Activity: 8-22 mins   PT G Codes:        Ellouise Newer 03/28/2015, 1:51 PM Camille Bal [place], [place]

## 2015-03-28 NOTE — Care Management Note (Signed)
Case Management Note  Patient Details  Name: Ethan Rose MRN: [id number] Date of Birth: 17-Jul-1946  Subjective/Objective:                   Posterior pedicle subtraction osteotomy with T10 to Iliac fusion with Dr. [name] assisting (N/A) - Posterior pedicle subtraction osteotomy with T10 to Iliac fusion with Dr. [name] assisting Action/Plan: Discharge planning  Expected Discharge Date:                  Expected Discharge Plan:  [place]  In-House Referral:     Discharge planning Services  CM Consult  Post Acute Care Choice:    Choice offered to:     DME Arranged:    DME Agency:     HH Arranged:    [place] Agency:     Status of Service:  Completed, signed off  Medicare Important Message Given:    Date Medicare IM Given:    Medicare IM give by:    Date Additional Medicare IM Given:    Additional Medicare Important Message give by:     If discussed at [place] of Stay Meetings, dates discussed:    Additional Comments: CM notes pt to go to SNF [facility] ) post discharge; CSW aware.  No other CM needs were communicated. Dellie Catholic, RN 03/28/2015, 2:44 PM

## 2015-03-29 MED ORDER — FLEET ENEMA 7-19 GM/118ML RE ENEM: 1.0000 | ENEMA | Freq: Every day | RECTAL | Status: DC | PRN

## 2015-03-29 MED ORDER — FLEET ENEMA 7-19 GM/118ML RE ENEM
1.0000 | ENEMA | Freq: Every day | RECTAL | Status: DC | PRN
  Filled 2015-03-29: qty 1

## 2015-03-29 NOTE — Progress Notes (Signed)
Patient ID: Ethan Rose, male   DOB: August 13, 1946, 69 y.o.   MRN: [id number] Less incisional pain. Left DF weakness sice ueasday not worse 2-3/5 with normal sensory. Abdomen distended some flatus. No bm .  See oprders

## 2015-03-29 NOTE — Evaluation (Signed)
Occupational Therapy Re-Evaluation Patient Details Name: Ethan Rose MRN: [id number] DOB: 11-08-1946 Today's Date: 03/29/2015    History of Present Illness 69 y.o. s/p right Lumbar two-Three,Lumbar Three-Four,Lumbar Four-Five Anterior lateral lumbar interbody fusion (Right) and abdominal exposure. s/p Posterior pedicle subtraction osteotomy with T10 to Iliac fusion 5/13. (part of a 2 step decompression)   Clinical Impression   Pt re-evaluated by OT - goals and treatment plan remain appropriate.  Currently, he requires mod - max A for ADLs and fatigues quickly.  He will need SNF level rehab at discharge.  He will benefit from continued acute OT to address teh below listed deficits and maximize independence with ADLs.     Follow Up Recommendations  SNF    Equipment Recommendations  None recommended by OT    Recommendations for Other Services       Precautions / Restrictions Precautions Precautions: Back Precaution Comments: Pt able to state 3/3 precautions  Required Braces or Orthoses: Spinal Brace Spinal Brace: Thoracolumbosacral orthotic;Applied in sitting position Restrictions Weight Bearing Restrictions: No      Mobility Bed Mobility Overal bed mobility: Needs Assistance Bed Mobility: Rolling;Sidelying to Sit;Sit to Sidelying Rolling: Min assist Sidelying to sit: Min assist     Sit to sidelying: Min assist General bed mobility comments: Min A to roll onto side, min A to lift trunk and min A to lift LEs onto bed   Transfers Overall transfer level: Needs assistance Equipment used: Rolling walker (2 wheeled) Transfers: Risk manager;Sit to/from Stand Sit to Stand: Min assist Stand pivot transfers: Min assist       General transfer comment: Min assist for boost to stand from recliner x2. cues to maintain neutral back alignment as pt tries to lean heavily to anterior. Demonstrates UE weakness when attempting to push up from chair. VC for hand placement.  Min assist for walker control with pivot to bed.    Balance Overall balance assessment: Needs assistance Sitting-balance support: Feet supported Sitting balance-Leahy Scale: Fair     Standing balance support: Single extremity supported Standing balance-Leahy Scale: Poor                              ADL Overall ADL's : Needs assistance/impaired Eating/Feeding: Independent   Grooming: Wash/dry hands;Wash/dry face;Oral care;Brushing hair;Supervision/safety;Sitting   Upper Body Bathing: Minimal assitance;Sitting;Bed level   Lower Body Bathing: Maximal assistance;Sit to/from stand;Bed level   Upper Body Dressing : Moderate assistance;Sitting   Lower Body Dressing: Maximal assistance;Sit to/from stand Lower Body Dressing Details (indicate cue type and reason): discussed use of AE (his niece purchased him a hip kit  Toilet Transfer: Minimal assistance;Stand-pivot;BSC;RW Toilet Transfer Details (indicate cue type and reason): Pt fatigues quickly.  Unable to tolerate ambulation to BR Toileting- Clothing Manipulation and Hygiene: Maximal assistance;Sit to/from stand       Functional mobility during ADLs: Minimal assistance General ADL Comments: Pt limited by fatigue and LE weakness      Vision     Perception     Praxis      Pertinent Vitals/Pain Pain Assessment: 0-10 Pain Score: 5  Pain Location: back and hips  Pain Descriptors / Indicators: Aching;Constant Pain Intervention(s): Monitored during session;Repositioned     Hand Dominance Right   Extremity/Trunk Assessment Upper Extremity Assessment Upper Extremity Assessment: Defer to OT evaluation   Lower Extremity Assessment Lower Extremity Assessment: Defer to PT evaluation       Communication Communication Communication: No  difficulties   Cognition Arousal/Alertness: Awake/alert Behavior During Therapy: WFL for tasks assessed/performed Overall Cognitive Status: Within Functional Limits for tasks  assessed                     General Comments       Exercises Exercises: General Lower Extremity     Shoulder Instructions      Home Living Family/patient expects to be discharged to:: Skilled nursing facility Living Arrangements: Alone   Type of Home: House Home Access: Stairs to enter Entrance Stairs-Number of Steps: 2 Entrance Stairs-Rails: None Home Layout: Able to live on main level with bedroom/bathroom               Home Equipment: Cane - quad   Additional Comments: walk in shower, with bench, standard height toilets      Prior Functioning/Environment Level of Independence: Independent with assistive device(s)        Comments: quad cane    OT Diagnosis: Generalized weakness;Acute pain   OT Problem List: Decreased strength;Decreased activity tolerance;Impaired balance (sitting and/or standing);Decreased knowledge of use of DME or AE;Pain;Decreased knowledge of precautions   OT Treatment/Interventions: Self-care/ADL training;DME and/or AE instruction;Therapeutic activities;Patient/family education;Balance training    OT Goals(Current goals can be found in the care plan section) Acute Rehab OT Goals Patient Stated Goal: to get some rehab and get stronger  OT Goal Formulation: With patient Time For Goal Achievement: 04/05/15 Potential to Achieve Goals: Good  OT Frequency: Min 2X/week   Barriers to D/C: Decreased caregiver support          Co-evaluation              End of Session Equipment Utilized During Treatment: Back brace Nurse Communication: Mobility status  Activity Tolerance: No increased pain;Patient limited by fatigue Patient left: in bed;with call bell/phone within reach;with family/visitor present   Time: 1326-1416 OT Time Calculation (min): 50 min Charges:  OT General Charges $OT Visit: 1 Procedure OT Evaluation $OT Re-eval: 1 Procedure OT Treatments $Self Care/Home Management : 8-22 mins $Therapeutic Activity:  8-22 mins G-Codes:    Marisa Hufstetler M 2015-04-21, 2:33 PM

## 2015-03-29 NOTE — Progress Notes (Addendum)
Physical Therapy Treatment Patient Details Name: Ethan Rose MRN: [id number] DOB: Mar 22, 1946 Today's Date: 03/29/2015    History of Present Illness 69 y.o. s/p right Lumbar two-Three,Lumbar Three-Four,Lumbar Four-Five Anterior lateral lumbar interbody fusion (Right) and abdominal exposure. s/p Posterior pedicle subtraction osteotomy with T10 to Iliac fusion 5/13. (part of a 2 step decompression)    PT Comments    Patient progressing slowly towards physical therapy goals. Continues to require assist for transfers with weak hip flexion during ambulation. Pt fatigued rapidly after ambulating short distance and LEs began to buckle requiring mod assist for stability prior to sitting. States he has been compliant with exercises and these are becoming easier to perform. Patient will continue to benefit from skilled physical therapy services to further improve independence with functional mobility.   Follow Up Recommendations  SNF;Supervision/Assistance - 24 hour     Equipment Recommendations  Rolling walker with 5" wheels    Recommendations for Other Services OT consult     Precautions / Restrictions Precautions Precautions: Back Precaution Comments: reviewed Required Braces or Orthoses: Spinal Brace Spinal Brace: Thoracolumbosacral orthotic;Applied in sitting position Restrictions Weight Bearing Restrictions: No    Mobility  Bed Mobility Overal bed mobility: Needs Assistance Bed Mobility: Rolling;Sit to Sidelying Rolling: Min assist       Sit to sidelying: Min assist General bed mobility comments: Min assist for LE support into bed. Reviewed log roll technique. min assist again for Le support to roll onto back. Does a good job of maintain back precautions with cues.  Transfers Overall transfer level: Needs assistance Equipment used: Rolling walker (2 wheeled) Transfers: Sit to/from [facility] Sit to Stand: Min assist Stand pivot transfers: Min assist        General transfer comment: Min assist for boost to stand from recliner x2. cues to maintain neutral back alignment as pt tries to lean heavily to anterior. Demonstrates UE weakness when attempting to push up from chair. VC for hand placement. Min assist for walker control with pivot to bed.  Ambulation/Gait Ambulation/Gait assistance: Mod assist Ambulation Distance (Feet): 20 Feet Assistive device: Rolling walker (2 wheeled) Gait Pattern/deviations: Step-through pattern;Decreased stride length;Trunk flexed;Decreased dorsiflexion - left Gait velocity: decreased Gait velocity interpretation: Below normal speed for age/gender General Gait Details: Pt intially ambulating at a min guard assist with cues for upright posture. pt fatigued rapidly towards end of distance requiring mod assist to maintain balance as LEs began to buckle.  Cues for upright opsture and walker placement for UE support.   Stairs            Wheelchair Mobility    Modified Rankin (Stroke Patients Only)       Balance                                    Cognition Arousal/Alertness: Awake/alert Behavior During Therapy: WFL for tasks assessed/performed Overall Cognitive Status: Within Functional Limits for tasks assessed                      Exercises General Exercises - Lower Extremity Ankle Circles/Pumps: AROM;Both;10 reps;Seated Long Arc Quad: Strengthening;Both;5 reps;Seated Hip Flexion/Marching: Strengthening;Both;10 reps;Seated    General Comments        Pertinent Vitals/Pain Pain Assessment: 0-10 Pain Score: 7  Pain Location: back, hips Pain Descriptors / Indicators: Aching;Sore Pain Intervention(s): Monitored during session;Repositioned    Home Living  Prior Function            PT Goals (current goals can now be found in the care plan section) Acute Rehab PT Goals Patient Stated Goal: to get some rehab and get stronger  PT Goal  Formulation: With patient Time For Goal Achievement: 04/11/15 Potential to Achieve Goals: Good Progress towards PT goals: Progressing toward goals    Frequency  Min 5X/week    PT Plan Current plan remains appropriate    Co-evaluation             End of Session Equipment Utilized During Treatment: Gait belt;Back brace Activity Tolerance: Patient limited by pain;Patient limited by fatigue Patient left: in bed;with call bell/phone within reach;with family/visitor present;with SCD's reapplied     Time: 9169-4503 PT Time Calculation (min) (ACUTE ONLY): 24 min  Charges:  $Gait Training: 8-22 mins $Therapeutic Activity: 8-22 mins                    G Codes:      Ellouise Newer 2015-04-15, 1:49 PM Camille Bal [place], [place]

## 2015-03-30 ENCOUNTER — Encounter (HOSPITAL_COMMUNITY): Payer: Self-pay | Admitting: Neurosurgery

## 2015-03-30 NOTE — Clinical Social Work Note (Signed)
CSW reviewed chart. Once pt is medically stable he will transition to [facility]. CSW will continue to provided support and assist with discharge needs.   [place], MSW, [place]

## 2015-03-30 NOTE — Care Management Note (Signed)
Case Management Note  Patient Details  Name: Ethan Rose MRN: [id number] Date of Birth: 02-23-1946  Subjective/Objective:  Admitted for surgery, ileus resolving                  Action/Plan: Patient is for SNF placement when medically stable/ [facility]  Expected Discharge Date:        03/31/2015          Expected Discharge Plan:  [place]  In-House Referral:     Discharge planning Services  CM Consult  Status of Service:  Completed, signed off  Medicare Important Message Given:    B Tamera Punt RN Date Medicare IM Given:   03/27/2015 Medicare IM give by:    Mindi Slicker RN Date Additional Medicare IM Given:   03/30/2015 Additional Medicare Important Message give by:     If discussed at [place] of Stay Meetings, dates discussed:    Additional Comments:  [name] [phone] 03/30/2015, 4:22 PM

## 2015-03-30 NOTE — Progress Notes (Signed)
Occupational Therapy Treatment Patient Details Name: Ethan Rose MRN: [id number] DOB: 07-Oct-1946 Today's Date: 03/30/2015    History of present illness 69 y.o. s/p right Lumbar two-Three,Lumbar Three-Four,Lumbar Four-Five Anterior lateral lumbar interbody fusion (Right) and abdominal exposure. s/p Posterior pedicle subtraction osteotomy with T10 to Iliac fusion 5/13.   OT comments  Pt educated on sitting to don brace and able to thread L side with L UE with (A). Pt completed toilet transfer with 3n1. Pt verbalized feeling dizzy with initial sitting EOB. Pt able to progress with incr time.   Follow Up Recommendations  SNF    Equipment Recommendations  None recommended by OT    Recommendations for Other Services      Precautions / Restrictions Precautions Precautions: Back Precaution Booklet Issued: Yes (comment) Precaution Comments: able to verbalize 3 out 3 precautions  Required Braces or Orthoses: Spinal Brace Spinal Brace: Thoracolumbosacral orthotic;Applied in sitting position Restrictions Weight Bearing Restrictions: No       Mobility Bed Mobility Overal bed mobility: Needs Assistance Bed Mobility: Supine to Sit;Sidelying to Sit Rolling: Min assist Sidelying to sit: Mod assist Supine to sit: Mod assist;HOB elevated (hob 23 degrees)   Sit to sidelying: Min assist (for LE management back into bed) General bed mobility comments: pt demonstrates R LE knee flexion and good hip trunk log roll  Transfers Overall transfer level: Needs assistance Equipment used: Rolling walker (2 wheeled) Transfers: Sit to/from Stand Sit to Stand: Min assist         General transfer comment: incr time    Balance Overall balance assessment: Needs assistance Sitting-balance support: Single extremity supported;Feet supported Sitting balance-Leahy Scale: Fair     Standing balance support: Bilateral upper extremity supported Standing balance-Leahy Scale: Poor                     ADL Overall ADL's : Needs assistance/impaired     Grooming: Wash/dry hands;Oral care;Set up;Sitting Grooming Details (indicate cue type and reason): pt provided on bedside table                 Toilet Transfer: Moderate assistance;Ambulation;BSC;RW Toilet Transfer Details (indicate cue type and reason): pt needed cues for hand placement Toileting- Clothing Manipulation and Hygiene: Moderate assistance;Sit to/from stand       Functional mobility during ADLs: Minimal assistance;Rolling walker General ADL Comments: Pt unable to void bowels but expressed concern with inability to void. Pt educated on don of brace in sitting and needing Max (A). pt processing this session with threating straps on L side because pt is L handed      Vision                     Perception     Praxis      Cognition   Behavior During Therapy: WFL for tasks assessed/performed Overall Cognitive Status: Within Functional Limits for tasks assessed                       Extremity/Trunk Assessment               Exercises General Exercises - Lower Extremity Ankle Circles/Pumps: AROM;Both;10 reps;Seated Long Arc Quad: Strengthening;Both;5 reps;Seated (with 5 second hold, c/o stretch in hips) Hip Flexion/Marching: Strengthening;Both;10 reps;Seated   Shoulder Instructions       General Comments      Pertinent Vitals/ Pain       Pain Assessment: 0-10 Pain Score: 8  Pain Location:  back and hips Pain Descriptors / Indicators: Aching;Constant Pain Intervention(s): Monitored during session;Premedicated before session  Home Living                                          Prior Functioning/Environment              Frequency Min 2X/week     Progress Toward Goals  OT Goals(current goals can now be found in the care plan section)  Progress towards OT goals: Progressing toward goals  Acute Rehab OT Goals Patient Stated Goal: to get some  rehab and get stronger  OT Goal Formulation: With patient Time For Goal Achievement: 04/05/15 Potential to Achieve Goals: Good ADL Goals Pt Will Perform Lower Body Bathing: with min guard assist;with adaptive equipment;sit to/from stand Pt Will Perform Lower Body Dressing: with min guard assist;with adaptive equipment;sit to/from stand Pt Will Transfer to Toilet: with supervision;ambulating Pt Will Perform Toileting - Clothing Manipulation and hygiene: with min guard assist;sit to/from stand Additional ADL Goal #1: Pt will independently verbalize 3/3 back precautions and maintain during ADLs/functional tasks. Additional ADL Goal #2: Caregiver will be independent with assisting pt with donning/doffing back brace.  Plan Discharge plan remains appropriate    Co-evaluation                 End of Session Equipment Utilized During Treatment: Gait belt;Rolling walker;Back brace   Activity Tolerance Patient tolerated treatment well   Patient Left in chair;with call bell/phone within reach;with chair alarm set   Nurse Communication Mobility status;Precautions        Time: 3704-8889 OT Time Calculation (min): 32 min  Charges: OT General Charges $OT Visit: 1 Procedure OT Treatments $Self Care/Home Management : 23-37 mins  Parke Poisson B 03/30/2015, 11:10 AM  Pager: [phone]

## 2015-03-30 NOTE — Progress Notes (Signed)
Subjective: Patient reports "She gave me some Robaxin and I feel less pain when I lift my legs and less pulling when I raise my arms"  Objective: Vital signs in last 24 hours: Temp:  [98.2 F (36.8 C)-98.9 F (37.2 C)] 98.4 F (36.9 C) (05/16 0600) Pulse Rate:  [75-82] 82 (05/16 0600) Resp:  [20] 20 (05/16 0600) BP: (105-130)/(55-64) 118/64 mmHg (05/16 0600) SpO2:  [94 %-96 %] 96 % (05/16 0600)  Intake/Output from previous day: 05/15 0701 - 05/16 0700 In: 600 [P.O.:600] Out: 285 [Drains:285] Intake/Output this shift:    Alert, conversant. Belly distended but denies tenderness. BS active x4. Passing flatus. No BM yet - will request Dulcolax suppository this am. Good strength BLE (left dorsiflexion remains weaker than right). Incisions without erythema,swelling, or drainage. Hemovac dried in tube.   Lab Results: No results for input(s): WBC, HGB, HCT, PLT in the last 72 hours. BMET No results for input(s): NA, K, CL, CO2, GLUCOSE, BUN, CREATININE, CALCIUM in the last 72 hours.  Studies/Results: No results found.  Assessment/Plan: Improving, slowed by ileus   LOS: 6 days  Per Dr. Vertell Limber, mobilize with PT/OT in TLSO. Dulcolax suppository as prev ordered. D/C Hemovac.  Planning for SNF.    Verdis Prime 03/30/2015, 8:24 AM

## 2015-03-30 NOTE — Progress Notes (Signed)
UR COMPLETED  

## 2015-03-30 NOTE — Progress Notes (Signed)
Physical Therapy Treatment Patient Details Name: Ethan Rose MRN: [id number] DOB: 1945-12-08 Today's Date: 03/30/2015    History of Present Illness 69 y.o. s/p right Lumbar two-Three,Lumbar Three-Four,Lumbar Four-Five Anterior lateral lumbar interbody fusion (Right) and abdominal exposure. s/p Posterior pedicle subtraction osteotomy with T10 to Iliac fusion 5/13.    PT Comments    Pt con't to have bilat LE weakness and increased pain limiting ambulation tolerance. Acute PT to con't to follow to progress mobility as able.  Follow Up Recommendations  SNF;Supervision/Assistance - 24 hour     Equipment Recommendations  Rolling walker with 5" wheels    Recommendations for Other Services       Precautions / Restrictions Precautions Precautions: Back Precaution Booklet Issued: Yes (comment) Precaution Comments: able to recall 3/3 precautions Required Braces or Orthoses: Spinal Brace Spinal Brace: Thoracolumbosacral orthotic;Applied in sitting position Restrictions Weight Bearing Restrictions: No    Mobility  Bed Mobility Overal bed mobility: Needs Assistance Bed Mobility: Sit to Sidelying;Rolling Rolling: Modified independent (Device/Increase time) Sidelying to sit: Mod assist Supine to sit: Mod assist;HOB elevated (hob 23 degrees)   Sit to sidelying: Min assist (for LE management back into bed) General bed mobility comments: v/c's for technique, increased time  Transfers Overall transfer level: Needs assistance Equipment used: Rolling walker (2 wheeled) Transfers: Sit to/from Stand Sit to Stand: Min assist         General transfer comment: increased time, labored effort from low surface height, min A to steady pt during transition of hands from chair to RW  Ambulation/Gait Ambulation/Gait assistance: Min assist Ambulation Distance (Feet): 25 Feet Assistive device: Rolling walker (2 wheeled) Gait Pattern/deviations: Step-through pattern;Decreased stride  length;Narrow base of support Gait velocity: decreased Gait velocity interpretation: Below normal speed for age/gender General Gait Details: pt amb to door and back. pt c/o "my legs are so weak i feel like they're going to give out on me." fatigues quickly   Stairs            Wheelchair Mobility    Modified Rankin (Stroke Patients Only)       Balance Overall balance assessment: Needs assistance Sitting-balance support: Single extremity supported;Feet supported Sitting balance-Leahy Scale: Fair     Standing balance support: Bilateral upper extremity supported Standing balance-Leahy Scale: Poor                      Cognition Arousal/Alertness: Awake/alert Behavior During Therapy: WFL for tasks assessed/performed Overall Cognitive Status: Within Functional Limits for tasks assessed                      Exercises General Exercises - Lower Extremity Ankle Circles/Pumps: AROM;Both;10 reps;Seated Long Arc Quad: Strengthening;Both;5 reps;Seated (with 5 second hold, c/o stretch in hips) Hip Flexion/Marching: Strengthening;Both;10 reps;Seated    General Comments General comments (skin integrity, edema, etc.): assist to doff TLSO sitting EOB      Pertinent Vitals/Pain Pain Assessment: 0-10 Pain Score: 8  Pain Location: back and hips Pain Descriptors / Indicators: Aching;Constant Pain Intervention(s): Monitored during session;Premedicated before session    Home Living                      Prior Function            PT Goals (current goals can now be found in the care plan section) Acute Rehab PT Goals Patient Stated Goal: to get some rehab and get stronger  Progress towards PT goals: Progressing  toward goals    Frequency  Min 5X/week    PT Plan Current plan remains appropriate    Co-evaluation             End of Session Equipment Utilized During Treatment: Gait belt;Back brace Activity Tolerance: Patient limited by  fatigue;Patient limited by pain Patient left: in bed;with call bell/phone within reach;with family/visitor present;with SCD's reapplied     Time: 1308-6578 PT Time Calculation (min) (ACUTE ONLY): 23 min  Charges:  $Gait Training: 8-22 mins $Therapeutic Exercise: 8-22 mins                    G Codes:      Ethan Rose 03/30/2015, 10:32 AM     Ethan Rose, PT, DPT Pager #: [phone] Office #: [phone]

## 2015-03-31 MED ORDER — NAPHAZOLINE HCL 0.1 % OP SOLN
1.0000 [drp] | Freq: Four times a day (QID) | OPHTHALMIC | Status: DC | PRN
  Filled 2015-03-31: qty 15

## 2015-03-31 NOTE — Clinical Social Work Note (Signed)
CSW spoke with Hoyle Sauer from [facility]. CSW provided [place] with updates regarding the pt's treatment. Hoyle Sauer reported that she will be meeting with the pt today to finalize paperwork. CSW will continue to assist and follow.    [place], MSW, [place]

## 2015-03-31 NOTE — Progress Notes (Signed)
Soaps suds enema administered.

## 2015-03-31 NOTE — Progress Notes (Signed)
Occupational Therapy Treatment Patient Details Name: Ethan Rose MRN: [id number] DOB: 06-Jul-1946 Today's Date: 03/31/2015    History of present illness 69 y.o. s/p right Lumbar two-Three,Lumbar Three-Four,Lumbar Four-Five Anterior lateral lumbar interbody fusion (Right) and abdominal exposure. s/p Posterior pedicle subtraction osteotomy with T10 to Iliac fusion 5/13.   OT comments  Pt progressed from bed to bathroom and ending session in chair with family present. Pt encouraged to complete OOB to chair transfer for all meals from this point forward. Pt agreeable. Pt able to recall 3 out 3 precautions and change of position time appropriate for back precautions. Pt continues to complain of bowel discomfort and inability to void.    Follow Up Recommendations  SNF ([facility])    Equipment Recommendations  Other (comment) ([facility] SNF)    Recommendations for Other Services      Precautions / Restrictions Precautions Precautions: Back Required Braces or Orthoses: Spinal Brace Spinal Brace: Thoracolumbosacral orthotic;Applied in sitting position Restrictions Weight Bearing Restrictions: No       Mobility Bed Mobility Overal bed mobility: Needs Assistance Bed Mobility: Supine to Sit;Sidelying to Sit Rolling: Min assist Sidelying to sit: Mod assist Supine to sit: Mod assist;HOB elevated (~18 degrees)     General bed mobility comments: pt requires use of bed rails at this time  Transfers Overall transfer level: Needs assistance Equipment used: Rolling walker (2 wheeled) Transfers: Sit to/from Stand Sit to Stand: Min assist         General transfer comment: cues for hand placement    Balance Overall balance assessment: Needs assistance Sitting-balance support: Bilateral upper extremity supported;Feet supported Sitting balance-Leahy Scale: Fair     Standing balance support: Single extremity supported;During functional activity Standing balance-Leahy Scale: Poor                      ADL Overall ADL's : Needs assistance/impaired Eating/Feeding: Modified independent;Sitting Eating/Feeding Details (indicate cue type and reason): drinking fluids- liquid diet Grooming: Wash/dry face;Oral care;Minimal assistance;Standing;Wash/dry hands (Cuing for posture, need to lean against sink) Grooming Details (indicate cue type and reason): v/c for safety with back precautions.pt demonstrates fatigue with static standing         Upper Body Dressing : Sitting;Minimal assistance Upper Body Dressing Details (indicate cue type and reason): pt mod (A) to don TLSO . Pt able to thread straps but unable to latch and pull tight     Toilet Transfer: Minimal assistance;BSC;RW;Ambulation Toilet Transfer Details (indicate cue type and reason): cues for hand placement and not to pull on RW (verbal cuing) Toileting- Clothing Manipulation and Hygiene: Sit to/from stand;Moderate assistance Toileting - Clothing Manipulation Details (indicate cue type and reason): cues to avoid twisting     Functional mobility during ADLs: Minimal assistance;Rolling walker General ADL Comments: pt educated on OOB x3 daily for all meals. Pt agreeable. Pt informed x2 therapy sessions today OT / PT and agreeable. Pt encouraged that mobility can (A) with lack of bowel movement at this time. PA Aaron Edelman present and informed of education and duplicated education regarding bowels. PA brian informed pt and daughter to request additional medication prior to lunch. Pt with top wound dressing rolled up and RN Yeni called to room to address wound. PA Aaron Edelman informed of RN Mahala Menghini applying second dressing to wound to prevent open to air      Vision                     Perception  Praxis      Cognition   Behavior During Therapy: WFL for tasks assessed/performed Overall Cognitive Status: Within Functional Limits for tasks assessed                       Extremity/Trunk Assessment                Exercises     Shoulder Instructions       General Comments      Pertinent Vitals/ Pain       Pain Intervention(s): Monitored during session;Repositioned;Premedicated before session  Home Living                                          Prior Functioning/Environment              Frequency Min 2X/week     Progress Toward Goals  OT Goals(current goals can now be found in the care plan section)  Progress towards OT goals: Progressing toward goals  Acute Rehab OT Goals Patient Stated Goal: to get some rehab and get stronger  OT Goal Formulation: With patient Time For Goal Achievement: 04/05/15 Potential to Achieve Goals: Good ADL Goals Pt Will Perform Lower Body Bathing: with min guard assist;with adaptive equipment;sit to/from stand Pt Will Perform Lower Body Dressing: with min guard assist;with adaptive equipment;sit to/from stand Pt Will Transfer to Toilet: with supervision;ambulating Pt Will Perform Toileting - Clothing Manipulation and hygiene: with min guard assist;sit to/from stand Additional ADL Goal #1: Pt will independently verbalize 3/3 back precautions and maintain during ADLs/functional tasks. Additional ADL Goal #2: Caregiver will be independent with assisting pt with donning/doffing back brace.  Plan Discharge plan remains appropriate    Co-evaluation                 End of Session Equipment Utilized During Treatment: Gait belt;Rolling walker;Back brace   Activity Tolerance Patient tolerated treatment well   Patient Left in chair;with call bell/phone within reach;with family/visitor present   Nurse Communication Mobility status;Precautions        Time: 5809-9833 OT Time Calculation (min): 22 min  Charges: OT General Charges $OT Visit: 1 Procedure OT Treatments $Self Care/Home Management : 8-22 mins  Parke Poisson B 03/31/2015, 9:44 AM Pager: [phone]

## 2015-03-31 NOTE — Progress Notes (Signed)
Patient is worried about his feet being swollen.  Explained to him that he must get up and ambulate a little more.  Will continue to monitor patient.

## 2015-03-31 NOTE — Progress Notes (Signed)
Physical Therapy Treatment Patient Details Name: Ethan Rose MRN: [id number] DOB: [birth year] Today's Date: 03/31/2015    History of Present Illness 69 y.o. s/p right Lumbar two-Three,Lumbar Three-Four,Lumbar Four-Five Anterior lateral lumbar interbody fusion (Right) and abdominal exposure. s/p Posterior pedicle subtraction osteotomy with T10 to Iliac fusion 5/13.    PT Comments    Patient progressing with mobility. Assisted OOB to chair for the second time today. Spoke with patient regarding OOB mobility. Patient continued to demonstrate deficits in LLE strength and gait deficits. Will continue to see and progress as tolerated.   Follow Up Recommendations  SNF;Supervision/Assistance - 24 hour     Equipment Recommendations  Rolling walker with 5" wheels    Recommendations for Other Services OT consult     Precautions / Restrictions Precautions Precautions: Back Required Braces or Orthoses: Spinal Brace Spinal Brace: Thoracolumbosacral orthotic;Applied in sitting position Restrictions Weight Bearing Restrictions: No    Mobility  Bed Mobility Overal bed mobility: Needs Assistance Bed Mobility: Supine to Sit;Sidelying to Sit Rolling: Min assist Sidelying to sit: Min assist       General bed mobility comments: increased time to come to EOB, physical assist to elevate trunk to upright and scoot to EOB  Transfers Overall transfer level: Needs assistance Equipment used: Rolling walker (2 wheeled) Transfers: Sit to/from Stand Sit to Stand: Min assist         General transfer comment: VCs for hand placement and stability upon elevation to standing  Ambulation/Gait Ambulation/Gait assistance: Min guard;Min assist Ambulation Distance (Feet): 90 Feet Assistive device: Rolling walker (2 wheeled) Gait Pattern/deviations: Step-through pattern;Decreased stride length;Trunk flexed;Decreased dorsiflexion - left Gait velocity: decreased Gait velocity interpretation: Below  normal speed for age/gender General Gait Details: Patient with steppage gait pattern LLE, sues for knee flexion and upright posture   Stairs            Wheelchair Mobility    Modified Rankin (Stroke Patients Only)       Balance Overall balance assessment: Needs assistance Sitting-balance support: Bilateral upper extremity supported Sitting balance-Leahy Scale: Fair     Standing balance support: Single extremity supported Standing balance-Leahy Scale: Poor                      Cognition Arousal/Alertness: Awake/alert Behavior During Therapy: WFL for tasks assessed/performed Overall Cognitive Status: Within Functional Limits for tasks assessed                      Exercises      General Comments General comments (skin integrity, edema, etc.): assisted with brace.  Educated extensively on importance on mobility and OOB to chair multiple times per day to encourage physiological recovery of function. Patient tolerate ambulation without increased pain, encouraged continued ambulation with staff.      Pertinent Vitals/Pain Pain Assessment: 0-10 Pain Score: 5  Pain Location: back and right hip Pain Descriptors / Indicators: Aching Pain Intervention(s): Monitored during session;Repositioned;Limited activity within patient's tolerance;Relaxation;RN gave pain meds during session    Home Living                      Prior Function            PT Goals (current goals can now be found in the care plan section) Acute Rehab PT Goals Patient Stated Goal: to get some rehab and get stronger  PT Goal Formulation: With patient Time For Goal Achievement: 04/11/15 Potential to Achieve Goals: Good  Progress towards PT goals: Progressing toward goals    Frequency  Min 5X/week    PT Plan Current plan remains appropriate    Co-evaluation             End of Session Equipment Utilized During Treatment: Gait belt;Back brace Activity Tolerance:  Patient limited by fatigue;Patient limited by pain Patient left: in chair;with call bell/phone within reach;with family/visitor present     Time: 2992-4268 PT Time Calculation (min) (ACUTE ONLY): 37 min (-12 minutes for patient to meet with outside representative during session) 25 minutes treatment time  Charges:  $Gait Training: 8-22 mins $Therapeutic Activity: 8-22 mins                    G CodesDuncan Dull 2015-04-22, 2:26 PM Alben Deeds, [place] DPT  [phone]

## 2015-03-31 NOTE — Progress Notes (Signed)
Subjective: Patient reports "I feel pretty good. My eyes were a little dry and itchy last night"   Objective: Vital signs in last 24 hours: Temp:  [98.3 F (36.8 C)-99.5 F (37.5 C)] 99.1 F (37.3 C) (05/17 0535) Pulse Rate:  [66-78] 74 (05/17 0535) Resp:  [16-18] 16 (05/17 0535) BP: (105-127)/(55-75) 125/56 mmHg (05/17 0535) SpO2:  [93 %-97 %] 93 % (05/17 0535)  Intake/Output from previous day: 05/16 0701 - 05/17 0700 In: -  Out: 575 [Urine:500; Drains:75] Intake/Output this shift:    Alert & conversant, working with OT at present. Up to commode in TLSO. Passing flatus, but no BM yet. Abdominal distention persists, remains nontender. BS actx4.    Lab Results: No results for input(s): WBC, HGB, HCT, PLT in the last 72 hours. BMET No results for input(s): NA, K, CL, CO2, GLUCOSE, BUN, CREATININE, CALCIUM in the last 72 hours.  Studies/Results: No results found.  Assessment/Plan:   LOS: 7 days  Will return to assess incisions when back to chair or bed. Pt will request another Dulcolax suppository before lunch. He agrees to increase mobility, following OT/PT instructions, to eat meals when up in chair & moving from bed to chair several times daily.    Verdis Prime 03/31/2015, 9:28 AM

## 2015-04-01 DIAGNOSIS — M5416 Radiculopathy, lumbar region: Secondary | ICD-10-CM | POA: Diagnosis not present

## 2015-04-01 DIAGNOSIS — M4316 Spondylolisthesis, lumbar region: Secondary | ICD-10-CM | POA: Diagnosis not present

## 2015-04-01 DIAGNOSIS — K5900 Constipation, unspecified: Secondary | ICD-10-CM | POA: Diagnosis not present

## 2015-04-01 DIAGNOSIS — R5381 Other malaise: Secondary | ICD-10-CM | POA: Diagnosis not present

## 2015-04-01 DIAGNOSIS — M4317 Spondylolisthesis, lumbosacral region: Secondary | ICD-10-CM | POA: Diagnosis not present

## 2015-04-01 DIAGNOSIS — Z981 Arthrodesis status: Secondary | ICD-10-CM | POA: Diagnosis not present

## 2015-04-01 DIAGNOSIS — M4326 Fusion of spine, lumbar region: Secondary | ICD-10-CM | POA: Diagnosis not present

## 2015-04-01 DIAGNOSIS — M4320 Fusion of spine, site unspecified: Secondary | ICD-10-CM | POA: Diagnosis not present

## 2015-04-01 DIAGNOSIS — R278 Other lack of coordination: Secondary | ICD-10-CM | POA: Diagnosis not present

## 2015-04-01 DIAGNOSIS — M4120 Other idiopathic scoliosis, site unspecified: Secondary | ICD-10-CM | POA: Diagnosis not present

## 2015-04-01 DIAGNOSIS — M419 Scoliosis, unspecified: Secondary | ICD-10-CM | POA: Diagnosis not present

## 2015-04-01 DIAGNOSIS — D62 Acute posthemorrhagic anemia: Secondary | ICD-10-CM | POA: Diagnosis not present

## 2015-04-01 DIAGNOSIS — K5901 Slow transit constipation: Secondary | ICD-10-CM | POA: Diagnosis not present

## 2015-04-01 DIAGNOSIS — J309 Allergic rhinitis, unspecified: Secondary | ICD-10-CM | POA: Diagnosis not present

## 2015-04-01 DIAGNOSIS — M6281 Muscle weakness (generalized): Secondary | ICD-10-CM | POA: Diagnosis not present

## 2015-04-01 DIAGNOSIS — M62838 Other muscle spasm: Secondary | ICD-10-CM | POA: Diagnosis not present

## 2015-04-01 DIAGNOSIS — R2681 Unsteadiness on feet: Secondary | ICD-10-CM | POA: Diagnosis not present

## 2015-04-01 LAB — POCT I-STAT EG7
Acid-Base Excess: 5 mmol/L — ABNORMAL HIGH (ref 0.0–2.0)
BICARBONATE: 30.5 meq/L — AB (ref 20.0–24.0)
CALCIUM ION: 1.15 mmol/L (ref 1.13–1.30)
HEMATOCRIT: 35 % — AB (ref 39.0–52.0)
HEMOGLOBIN: 11.9 g/dL — AB (ref 13.0–17.0)
O2 SAT: 80 %
PCO2 VEN: 46.2 mmHg (ref 45.0–50.0)
PO2 VEN: 42 mmHg (ref 30.0–45.0)
Patient temperature: 36
Potassium: 4 mmol/L (ref 3.5–5.1)
SODIUM: 135 mmol/L (ref 135–145)
TCO2: 32 mmol/L (ref 0–100)
pH, Ven: 7.423 — ABNORMAL HIGH (ref 7.250–7.300)

## 2015-04-01 LAB — POCT I-STAT 4, (NA,K, GLUC, HGB,HCT)
Glucose, Bld: 149 mg/dL — ABNORMAL HIGH (ref 65–99)
HCT: 26 % — ABNORMAL LOW (ref 39.0–52.0)
Hemoglobin: 8.8 g/dL — ABNORMAL LOW (ref 13.0–17.0)
POTASSIUM: 4.6 mmol/L (ref 3.5–5.1)
SODIUM: 135 mmol/L (ref 135–145)

## 2015-04-01 MED ORDER — OXYCODONE-ACETAMINOPHEN 5-325 MG PO TABS: 1.0000 | ORAL_TABLET | ORAL | Status: DC | PRN

## 2015-04-01 MED ORDER — BISACODYL 10 MG RE SUPP: 10.0000 mg | Freq: Every day | RECTAL | Status: DC | PRN

## 2015-04-01 MED ORDER — POLYETHYLENE GLYCOL 3350 17 G PO PACK: 17.0000 g | PACK | Freq: Every day | ORAL | Status: DC | PRN

## 2015-04-01 MED ORDER — ALUM & MAG HYDROXIDE-SIMETH 200-200-20 MG/5ML PO SUSP: 30.0000 mL | Freq: Four times a day (QID) | ORAL | Status: DC | PRN

## 2015-04-01 NOTE — Clinical Social Work Placement (Signed)
   CLINICAL SOCIAL WORK PLACEMENT  NOTE  Date:  04/01/2015  Patient Details  Name: Ethan Rose MRN: [id number] Date of Birth: 07-Oct-1946  Clinical Social Work is seeking post-discharge placement for this patient at the [place] level of care (*CSW will initial, date and re-position this form in  chart as items are completed):      Patient/family provided with [facility]'s list of facilities offering this level of care within the geographic area requested by the patient (or if unable, by the patient's family).  Yes   Patient/family informed of their freedom to choose among providers that offer the needed level of care, that participate in Medicare, Medicaid or managed care program needed by the patient, have an available bed and are willing to accept the patient.  Yes   Patient/family informed of [place]'s ownership interest in [facility] and [facility], as well as of the fact that they are under no obligation to receive care at these facilities.  PASRR submitted to EDS on 03/25/15     PASRR number received on 03/25/15     Existing PASRR number confirmed on       FL2 transmitted to all facilities in geographic area requested by pt/family on       FL2 transmitted to all facilities within larger geographic area on       Patient informed that his/her managed care company has contracts with or will negotiate with certain facilities, including the following:        Yes   Patient/family informed of bed offers received.  Patient chooses bed at [facility]     Physician recommends and patient chooses bed at      Patient to be transferred to [facility] on 04/01/15.  Patient to be transferred to facility by [facility]      Patient family notified on 04/01/15 of transfer.  Name of family member notified:  Pt will notify his daughter [name]      PHYSICIAN       Additional Comment:     _______________________________________________ Greta Doom, LCSW 04/01/2015, 11:08 AM

## 2015-04-01 NOTE — Progress Notes (Signed)
Pt made attempt to have bm in [place]. Bisacodyl x 1 admin with no result. Pt attempted eating foods with limited residue. Passing flatus, no discomfort, bowel action all 4 quad. Staff at [facility] appraised of current conjdition.

## 2015-04-01 NOTE — Discharge Summary (Signed)
Physician Discharge Summary  Patient ID: DOYEL MULKERN MRN: [id number] DOB/AGE: [birth year] 69 y.o.  Admit date: 03/24/2015 Discharge date: 04/01/2015  Admission Diagnoses:Idiopathic Scoliosis, Spondylolisthesis, lumbosacral region; Radiculopathy, Lumbar region   Discharge Diagnoses: Idiopathic Scoliosis, Spondylolisthesis, lumbosacral region; Radiculopathy, Lumbar region, s/p staged surgeries Right Lumbar two-Three,Lumbar Three-Four,Lumbar Four-Five Anterior lateral lumbar interbody fusion  (Right) - right ABDOMINAL EXPOSURE (N/A) - left side approach; Attempted anterior retroperitoneal exposure of L5-S1; Posterior pedicle subtraction osteotomy L 1 with T10 to Iliac fusion with pedicle screw fixation and posterolateral arthrodesis - Posterior pedicle subtraction osteotomy with T10 to Iliac fusion with Dr. [name] assisting   Active Problems:   Other secondary scoliosis, thoracolumbar region   Kyphoscoliosis and scoliosis   Discharged Condition: good  Hospital Course: Ethan Rose was admitted for staged surgeries with dx scoliosis, spondylolisthesis, and radiculopathy. Attempted abdominal exposure for ALIF L5-S1; Right Lumbar two-Three,Lumbar Three-Four,Lumbar Four-Five Anterior lateral lumbar interbody fusion  (Right)  First stage, then second stage Posterior pedicle subtraction osteotomy L 1 with T10 to Iliac fusion with pedicle screw fixation and posterolateral arthrodesis - Posterior pedicle subtraction osteotomy with T10 to Iliac fusion with Dr. [name] assisting.  Pt recovered well from both and mobilized well. Progress has been slowed by apparent ileus, but he is progressing and reporting no discomfort.    Consults: vascular surgery  Significant Diagnostic Studies: radiology: X-Ray: intra-operative  Treatments: surgery: Attempted anterior retroperitoneal exposure of L5-S1 Right Lumbar two-Three,Lumbar Three-Four,Lumbar Four-Five Anterior lateral lumbar interbody fusion   (Right) - right ABDOMINAL EXPOSURE (N/A) - left side approach Posterior pedicle subtraction osteotomy L 1 with T10 to Iliac fusion with pedicle screw fixation and posterolateral arthrodesis - Posterior pedicle subtraction osteotomy with T10 to Iliac fusion with Dr. [name] assisting   Discharge Exam: Blood pressure 126/72, pulse 64, temperature 98.1 F (36.7 C), temperature source Oral, resp. rate 18, height 5\' 8"  (1.727 m), weight 83.462 kg (184 lb), SpO2 95 %. Alert, up in chair in TLSO. Taking liguid diet well. Mobilizing with assistance and rolling walker. Abdomen still nontender, distended, BS actx4, passing flatus. Enemas yesterday produced little result. Strength is good BLE (left dorsiflexion remains weaker than right). Incisions without erythema, swelling, or drainage.    Disposition: Discharge to [facility]. Per Dr. Kathyrn Sheriff, proceed with plan to d/c to [facility] today. Pt verbalizes understanding of need to continue liquids for now, continue daily suppository/enema until BM, and advance diet slowly. He will continue to increase his mobility.  Ok to remove drsgs at [place] ok to shower. No lotions creams or ointments to sites. Return to office in 3-4 weeks for evaluation.       Medication List    ASK your doctor about these medications        ACAI BERRY PO  Take 2 capsules by mouth daily.     Cholecalciferol 5000 UNITS capsule  Take 5,000 Units by mouth daily.     CINNAMON PO  Take 1 tablet by mouth daily.     HYDROcodone-acetaminophen 5-500 MG per tablet  Commonly known as:  VICODIN  Take 1 tablet by mouth every 4 (four) hours as needed (kidney stone). For kidney stone     loratadine 10 MG tablet  Commonly known as:  CLARITIN  Take 10 mg by mouth daily as needed for allergies.     multivitamin with minerals Tabs tablet  Take 1 tablet by mouth daily.     naproxen sodium 220 MG tablet  Commonly  known as:  ANAPROX  Take 220-440 mg by mouth 2  (two) times daily as needed (pain).     NON FORMULARY  Take 1 tablet by mouth 2 (two) times daily. OTC Medication-CardioCover     OVER THE COUNTER MEDICATION  Take 1,000 mg by mouth daily. Curcumin- Turmeric capsules     OVER THE COUNTER MEDICATION  Take 1 each by mouth daily. Isotonix     OVER THE COUNTER MEDICATION  Take 2 capsules by mouth 2 (two) times daily. Biotrust IC-5     OVER THE COUNTER MEDICATION  Take 1 tablet by mouth daily. Cardosone10     OVER THE COUNTER MEDICATION  Take 2 tablets by mouth daily. HL2 Premium Holy Supplement     OVER THE COUNTER MEDICATION  Take 2 capsules by mouth daily. Advanced EDTA Mega Plus     OVER THE COUNTER MEDICATION  Take 1 tablet by mouth daily. Vitamin K2     PAPAYA AND ENZYMES PO  Take 3 tablets by mouth 2 (two) times daily.     PROBIOTIC DAILY PO  Take 1 capsule by mouth 3 (three) times daily with meals.     ZINC PO  Take 1 tablet by mouth daily. OTC         Signed: Verdis Prime 04/01/2015, 8:56 AM

## 2015-04-01 NOTE — Progress Notes (Signed)
Physical Therapy Treatment Patient Details Name: Ethan Rose MRN: [id number] DOB: Dec 10, 1945 Today's Date: 04/01/2015    History of Present Illness 69 y.o. s/p right Lumbar two-Three,Lumbar Three-Four,Lumbar Four-Five Anterior lateral lumbar interbody fusion (Right) and abdominal exposure. s/p Posterior pedicle subtraction osteotomy with T10 to Iliac fusion 5/13.    PT Comments    Patient continues to progress with mobility. Demonstrates improvements in gait and stability this session.  Follow Up Recommendations  SNF;Supervision/Assistance - 24 hour     Equipment Recommendations  Rolling walker with 5" wheels    Recommendations for Other Services OT consult     Precautions / Restrictions Precautions Precautions: Back Required Braces or Orthoses: Spinal Brace Spinal Brace: Thoracolumbosacral orthotic;Applied in sitting position Restrictions Weight Bearing Restrictions: No    Mobility  Bed Mobility               General bed mobility comments: received in chair  Transfers Overall transfer level: Needs assistance Equipment used: Rolling walker (2 wheeled) Transfers: Sit to/from Stand Sit to Stand: Min assist         General transfer comment: No physical assist for elevation to standing  Ambulation/Gait Ambulation/Gait assistance: Min guard Ambulation Distance (Feet): 120 Feet Assistive device: Rolling walker (2 wheeled) Gait Pattern/deviations: Step-through pattern;Decreased stride length;Narrow base of support Gait velocity: decreased Gait velocity interpretation: Below normal speed for age/gender General Gait Details: Patient with steppage gait pattern LLE, sues for knee flexion and upright posture   Stairs            Wheelchair Mobility    Modified Rankin (Stroke Patients Only)       Balance     Sitting balance-Leahy Scale: Fair       Standing balance-Leahy Scale: Poor                      Cognition Arousal/Alertness:  Awake/alert Behavior During Therapy: WFL for tasks assessed/performed Overall Cognitive Status: Within Functional Limits for tasks assessed                      Exercises      General Comments        Pertinent Vitals/Pain Pain Assessment: 0-10 Pain Score: 4  Pain Location: back and hip Pain Descriptors / Indicators: Aching Pain Intervention(s): Monitored during session;Repositioned;Limited activity within patient's tolerance;Relaxation;RN gave pain meds during session    Home Living                      Prior Function            PT Goals (current goals can now be found in the care plan section) Acute Rehab PT Goals Patient Stated Goal: to get some rehab and get stronger  PT Goal Formulation: With patient Time For Goal Achievement: 04/11/15 Potential to Achieve Goals: Good Progress towards PT goals: Progressing toward goals    Frequency  Min 5X/week    PT Plan Current plan remains appropriate    Co-evaluation             End of Session Equipment Utilized During Treatment: Gait belt;Back brace Activity Tolerance: Patient limited by fatigue;Patient limited by pain Patient left: in chair;with call bell/phone within reach;with family/visitor present     Time: 9211-9417 PT Time Calculation (min) (ACUTE ONLY): 16 min  Charges:  $Gait Training: 8-22 mins  G CodesDuncan Dull 2015-04-02, 1:59 PM Alben Deeds, [place] DPT  [phone]

## 2015-04-01 NOTE — Progress Notes (Signed)
Subjective: Patient reports "I feel pretty good...still passing gas but no real bowel movement yet"  Objective: Vital signs in last 24 hours: Temp:  [97.7 F (36.5 C)-99.9 F (37.7 C)] 98.1 F (36.7 C) (05/18 0556) Pulse Rate:  [64-70] 64 (05/18 0556) Resp:  [18-20] 18 (05/18 0556) BP: (113-132)/(59-72) 126/72 mmHg (05/18 0556) SpO2:  [94 %-100 %] 95 % (05/18 0556)  Intake/Output from previous day: 05/17 0701 - 05/18 0700 In: -  Out: 600 [Urine:600] Intake/Output this shift:    Alert, up in chair in TLSO. Taking liguid diet well. Mobilizing with assistance and rolling walker. Abdomen still nontender, distended, BS actx4, passing flatus. Enemas yesterday produced little result. Strength is good BLE (left dorsiflexion remains weaker than right). Incisions without erythema, swelling, or drainage.   Lab Results: No results for input(s): WBC, HGB, HCT, PLT in the last 72 hours. BMET No results for input(s): NA, K, CL, CO2, GLUCOSE, BUN, CREATININE, CALCIUM in the last 72 hours.  Studies/Results: No results found.  Assessment/Plan: Improving   LOS: 8 days  Per Dr. Kathyrn Sheriff, proceed with plan to d/c to [facility] today. Pt verbalizes understanding of need to continue liquids for now, continue daily suppository/enema until BM, and advance diet slowly. He will continue to increase his mobility.  Ok to remove drsgs at [place] ok to shower. No lotions creams or ointments to sites. Return to office in 3-4 weeks for evaluation.    Verdis Prime 04/01/2015, 8:44 AM

## 2015-04-02 ENCOUNTER — Encounter (HOSPITAL_COMMUNITY): Payer: Self-pay | Admitting: Neurosurgery

## 2015-04-03 ENCOUNTER — Non-Acute Institutional Stay (SKILLED_NURSING_FACILITY): Payer: Medicare Other | Admitting: Adult Health

## 2015-04-03 ENCOUNTER — Encounter: Payer: Self-pay | Admitting: Adult Health

## 2015-04-03 DIAGNOSIS — K5900 Constipation, unspecified: Secondary | ICD-10-CM

## 2015-04-03 DIAGNOSIS — J309 Allergic rhinitis, unspecified: Secondary | ICD-10-CM

## 2015-04-03 DIAGNOSIS — M4316 Spondylolisthesis, lumbar region: Secondary | ICD-10-CM | POA: Insufficient documentation

## 2015-04-03 DIAGNOSIS — M62838 Other muscle spasm: Secondary | ICD-10-CM

## 2015-04-03 NOTE — Progress Notes (Signed)
Patient ID: Ethan Rose, male   DOB: [birth year], 69 y.o.   MRN: [id number]   04/03/2015  Facility:  Nursing Home Location:  [place] Room Number: 102-P LEVEL OF CARE:  SNF (31)   Chief Complaint  Patient presents with  . Hospitalization Follow-up    Physical deconditioning, spondylolisthesis of lumbosacral region, allergic rhinitis, constipation and muscle spasm    HISTORY OF PRESENT ILLNESS:  This is a 69 year old male who was been admitted to [facility] on 04/01/15 from [facility] with idiopathic scoliosis, spondylolisthesis, lumbosacral region: Radiculopathy, lumbar region S//P staged surgeries right L2-3, L3-4, L4-5 anterior lateral lumbar interbody fusion - right. He complains of muscle spasm and constipation 10 days.   He has PMH of prediabetes, GERD and squamous cell cancer.  He has been admitted for a short-term rehabilitation.  PAST MEDICAL HISTORY:  Past Medical History  Diagnosis Date  . Kidney calculi     Multiple passed in the past.   . GERD (gastroesophageal reflux disease)   . Hay fever   . 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 Chronic low back pain   . Prediabetes 2015    HbA1c 6.4%: pt saw nutritionist  . Neuromuscular disorder     spondylolisthesis - lumbar region  . Arthritis     carpal tunnel, L hand gout, arthritis - spine   . 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    CURRENT MEDICATIONS: Reviewed per MAR/see medication list  No Known Allergies   REVIEW OF SYSTEMS:  GENERAL: no change in appetite, no fatigue, no weight changes, no fever, chills or weakness RESPIRATORY: no cough, SOB, DOE, wheezing, hemoptysis CARDIAC: no chest pain, edema or palpitations GI: no abdominal pain, diarrhea, heart burn, nausea or vomiting, +constipation  PHYSICAL EXAMINATION  GENERAL: no acute distress, normal body habitus SKIN:  Surgical incision covered with honeycomb dressing on suprapubic area,  midline lumbar area and right lateral side of trunk EYES: conjunctivae normal, sclerae normal, normal eye lids NECK: supple, trachea midline, no neck masses, no thyroid tenderness, no thyromegaly LYMPHATICS: no LAN in the neck, no supraclavicular LAN RESPIRATORY: breathing is even & unlabored, BS CTAB CARDIAC: RRR, no murmur,no extra heart sounds, no edema GI: abdomen soft, normal BS, no masses, no tenderness, no hepatomegaly, no splenomegaly EXTREMITIES: Able to move 4 extremities PSYCHIATRIC: the patient is alert & oriented to person, affect & behavior appropriate  LABS/RADIOLOGY: Labs reviewed: Basic Metabolic Panel:  Recent Labs  04/16/14 1103 03/16/15 0927 03/27/15 0842 03/27/15 1440  NA 140 139 135 135  K 5.1 4.4 4.0 4.6  CL 102 103  --   --   CO2 31 26  --   --   GLUCOSE 111* 96  --  149*  BUN 16 13  --   --   CREATININE 0.7 0.49*  --   --   CALCIUM 9.6 9.4  --   --    Liver Function Tests:  Recent Labs  04/16/14 1103  AST 28  ALT 30  ALKPHOS 59  BILITOT 1.0  PROT 6.7  ALBUMIN 4.2   CBC:  Recent Labs  04/16/14 1103 03/16/15 0927 03/27/15 0842 03/27/15 1440  WBC 7.5 8.5  --   --   NEUTROABS 4.8  --   --   --   HGB 14.5 15.1 11.9* 8.8*  HCT 43.7 45.3 35.0* 26.0*  MCV 93.6 91.0  --   --  PLT 246.0 240  --   --    Lipid Panel:  Recent Labs  04/16/14 1103  HDL 47.10   CBG:  Recent Labs  03/24/15 0558 03/24/15 1304 03/27/15 1513  GLUCAP 128* 155* 137*    Dg Thoracolumabar Spine  03/25/2015   CLINICAL DATA:  Scoliosis, lumbar radiculopathy  EXAM: THORACOLUMBAR SPINE 1V  COMPARISON:  10/01/2014  FINDINGS: Limited evaluation secondary to technique.  There is a chronic T12, L1 and L2 vertebral body compression fracture. There is 2-3 mm of retrolisthesis of L2 on L3. There is likely grade 1 anterolisthesis of L5 on S1. There is no acute fracture or static listhesis. There are interbody spacer devices at L2-3, L3-4 and L4-5.  The visualized  portions of the lungs are clear.  There is no significant curvature greater than 5 degrees. Thoracolumbar kyphosis centered at L1. There is a minimal levocurvature of the lumbar spine. There are no intrinsic vertebral anomalies. No significant pelvic tilt. Soft tissues are unremarkable.  IMPRESSION: No significant thoracolumbar curvature greater than 5 degrees. 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   Electronically Signed   By: Kathreen Devoid   On: 03/25/2015 12:57   Dg Lumbar Spine 2-3 Views  03/24/2015   CLINICAL DATA:  Lumbar spine surgery.  EXAM: LUMBAR SPINE - 2-3 VIEW  COMPARISON:  09/30/2014.  FINDINGS: Interbody fusion of multiple lumbar discs. Lumbar vertebra cannot be counted on single AP view.  IMPRESSION: Interbody fusion of multiple lumbar vertebra.   Electronically Signed   By: Metcalfe   On: 03/24/2015 14:04   Dg Lumbar Spine Complete  03/27/2015   CLINICAL DATA:  T10-iliac fusion.  EXAM: LUMBAR SPINE - COMPLETE 4+ VIEW  COMPARISON:  09/30/2014  FINDINGS: Examination demonstrates moderate spondylosis of the thoracolumbar spine. Subtle retrolisthesis of L2 on L3 and L3 on L4 unchanged. Intervertebral spacers at the L2-3, L3-4 L4-5 levels. Stable mild anterior wedging of T12-L2. Placement of posterior fusion hardware from T10 to the S1 level and iliac bones intact. Remainder of the exam is unchanged.  IMPRESSION: Placement of posterior fusion hardware from T10 to the S1 level and iliac bones intact. Other chronic stable changes as described.   Electronically Signed   By: Marin Olp M.D.   On: 03/27/2015 14:42   Dg C-arm Gt 120 Min  03/24/2015   CLINICAL DATA:  Perioperative localization for lumbar spine fusion flow idiopathic scoliosis  EXAM: DG C-ARM GT 120 MIN  FLUOROSCOPY TIME:  Radiation Exposure Index (as provided by the fluoroscopic device):  If the device does not provide the exposure index:  Fluoroscopy Time (in minutes and seconds):  2 minutes 53 seconds  Number of  Acquired Images:  COMPARISON:  None  FINDINGS: C-arm fluoroscopy was provided for up to 120 minutes during lumbar laminectomy and interbody fusion .  IMPRESSION: C-arm fluoroscopy provided.   Electronically Signed   By: Ivar Drape M.D.   On: 03/24/2015 14:12    ASSESSMENT/PLAN:  Physical deconditioning -  for rehabilitation Spondylolisthesis of the lumbar region -  change Vicodin to 5/325 mg 1 tab by mouth every 4 hours when necessary for pain Muscle spasm - start Robaxin 500 mg 1 tab by mouth every 6 hours when necessary Constipation - start OPC3 1 capful + Isochrone 1 capful mix in 4 oz liquid PO Q Dac, Dulcolax suppository 1 PR Q D PRN and Lactulose 20gm/30 ml PO TID X 3 days Allergic rhinitis - continue Claritin 10 mg 1 tab by  mouth daily when necessary   Goals of care:  Short-term rehabilitation   Labs/test ordered:   CMP and CBC    [place], [place]

## 2015-04-06 ENCOUNTER — Encounter (HOSPITAL_COMMUNITY): Payer: Self-pay | Admitting: Neurosurgery

## 2015-04-07 ENCOUNTER — Non-Acute Institutional Stay (SKILLED_NURSING_FACILITY): Payer: Medicare Other | Admitting: Internal Medicine

## 2015-04-07 DIAGNOSIS — M4120 Other idiopathic scoliosis, site unspecified: Secondary | ICD-10-CM

## 2015-04-07 DIAGNOSIS — D62 Acute posthemorrhagic anemia: Secondary | ICD-10-CM | POA: Diagnosis not present

## 2015-04-07 DIAGNOSIS — J309 Allergic rhinitis, unspecified: Secondary | ICD-10-CM | POA: Diagnosis not present

## 2015-04-07 DIAGNOSIS — M4316 Spondylolisthesis, lumbar region: Secondary | ICD-10-CM | POA: Diagnosis not present

## 2015-04-07 DIAGNOSIS — K5901 Slow transit constipation: Secondary | ICD-10-CM

## 2015-04-07 DIAGNOSIS — R5381 Other malaise: Secondary | ICD-10-CM

## 2015-04-07 NOTE — Progress Notes (Signed)
Patient ID: Ethan Rose, male   DOB: 11-Nov-1946, 69 y.o.   MRN: [id number]      [facility]   PCP: Tammi Sou, MD  Code Status: full code  No Known Allergies  Chief Complaint  Patient presents with  . New Admit To SNF     HPI:  69 year old patient is here for short term rehabilitation post hospital admission from 03/24/15-04/01/15 with idiopathic scoliosis and spondylolisthesis of lumbosacral region with lumbar radiculopathy. He underwent right L2-3, L3-4, L4-5 antero - lateral lumbar interbody fusion. He also underwent T10 to iliac fusion with pedicle screw fixation and posterolateral arthrodesis. He is seen in his room with his friend present. He is in no distress, pain is under control and would like to back off on his narcotics and start aleve. He has PMH of prediabetes, GERD and squamous cell cancer.  Review of Systems:  Constitutional: Negative for fever, chills, diaphoresis.  HENT: Negative for headache, congestion, nasal discharge Eyes: Negative for eye pain, blurred vision, double vision and discharge.  Respiratory: Negative for cough, shortness of breath and wheezing.   Cardiovascular: Negative for chest pain, palpitations, leg swelling.  Gastrointestinal: Negative for heartburn, nausea, vomiting, abdominal pain. Had a bowel movement this am. Appetite is good  Genitourinary: Negative for dysuria Musculoskeletal: Negative for back pain, falls Skin: Negative for itching, rash.  Neurological: Negative for dizziness, tingling, focal weakness Psychiatric/Behavioral: Negative for depression   Past Medical History  Diagnosis Date  . Kidney calculi     Multiple passed in the past.   . GERD (gastroesophageal reflux disease)   . Hay fever   . 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 Chronic low back pain   . Prediabetes 2015    HbA1c 6.4%: pt saw nutritionist  . Neuromuscular disorder     spondylolisthesis - lumbar region    . Arthritis     carpal tunnel, L hand gout, arthritis - spine   . 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   Past Surgical History  Procedure Laterality Date  . Lithotripsy      Multiple; most recent was spring 2015  . Tonsillectomy  1954  . Colonoscopy  approx 2011    Dr. [name] (normal per pt report)  . Hernia repair  1993    Bilateral inguinal (urologist did these procedures)  . Anterior lat lumbar fusion Right 03/24/2015    Procedure: Right Lumbar two-Three,Lumbar Three-Four,Lumbar Four-Five Anterior lateral lumbar interbody fusion ;  Surgeon: [name], MD;  Location: [facility];  Service: Neurosurgery;  Laterality: Right;  right  . Abdominal exposure N/A 03/24/2015    Procedure: ABDOMINAL EXPOSURE;  Surgeon: [name], MD;  Location: [facility];  Service: Vascular;  Laterality: N/A;  left side approach  . Posterior lumbar fusion 4 level N/A 03/27/2015    Procedure: Posterior pedicle subtraction osteotomy with T10 to Iliac fusion with Dr. [name] assisting;  Surgeon: [name], MD;  Location: [facility];  Service: Neurosurgery;  Laterality: N/A;  Posterior pedicle subtraction osteotomy with T10 to Iliac fusion with Dr. [name] assisting   Social History:   reports that he quit smoking about 13 months ago. His smoking use included Cigarettes. He smoked 0.50 packs per day. He has never used smokeless tobacco. He reports that he drinks about 4.8 oz of alcohol per week. He reports that he does not use illicit drugs.  Family History  Problem Relation Age of Onset  . Stroke Mother   . Diabetes Mother   . Heart disease Mother   . Hypertension Mother   . Heart attack Mother   . Stroke Father   . Heart disease Father   . Diabetes Brother   . Hypertension Brother     Medications: Patient's Medications  New Prescriptions   No medications on file  Previous Medications   ACAI BERRY PO    Take 2 capsules by mouth daily.    ALUM & MAG  HYDROXIDE-SIMETH (MAALOX/MYLANTA) 200-200-20 MG/5ML SUSPENSION    Take 30 mLs by mouth every 6 (six) hours as needed for indigestion.   BISACODYL (DULCOLAX) 10 MG SUPPOSITORY    Place 1 suppository (10 mg total) rectally daily as needed for moderate constipation.   CHOLECALCIFEROL 5000 UNITS CAPSULE    Take 5,000 Units by mouth daily.   CINNAMON PO    Take 1 tablet by mouth daily.   DIGESTIVE ENZYMES (PAPAYA AND ENZYMES PO)    Take 3 tablets by mouth 2 (two) times daily.   LORATADINE (CLARITIN) 10 MG TABLET    Take 10 mg by mouth daily as needed for allergies.   MULTIPLE VITAMIN (MULITIVITAMIN WITH MINERALS) TABS    Take 1 tablet by mouth daily.   MULTIPLE VITAMINS-MINERALS (ZINC PO)    Take 1 tablet by mouth daily. OTC   NAPROXEN SODIUM (ANAPROX) 220 MG TABLET    Take 220-440 mg by mouth 2 (two) times daily as needed (pain).    NON FORMULARY    Take 1 tablet by mouth 2 (two) times daily. OTC Medication-CardioCover   OVER THE COUNTER MEDICATION    Take 1 tablet by mouth daily. Vitamin K2   OVER THE COUNTER MEDICATION    Take 1,000 mg by mouth daily. Curcumin- Turmeric capsules   OVER THE COUNTER MEDICATION    Take 1 each by mouth daily. Isotonix   OVER THE COUNTER MEDICATION    Take 2 capsules by mouth 2 (two) times daily. Biotrust IC-5   OVER THE COUNTER MEDICATION    Take 1 tablet by mouth daily. Cardosone10   OVER THE COUNTER MEDICATION    Take 2 tablets by mouth daily. HL2 Premium Holy Supplement   OVER THE COUNTER MEDICATION    Take 2 capsules by mouth daily. Advanced EDTA Mega Plus   OXYCODONE-ACETAMINOPHEN (PERCOCET/ROXICET) 5-325 MG PER TABLET    Take 1-2 tablets by mouth every 4 (four) hours as needed for moderate pain.   POLYETHYLENE GLYCOL (MIRALAX / GLYCOLAX) PACKET    Take 17 g by mouth daily as needed for mild constipation.   PROBIOTIC PRODUCT (PROBIOTIC DAILY PO)    Take 1 capsule by mouth 3 (three) times daily with meals.   Modified Medications   No medications on file   Discontinued Medications   No medications on file     Physical Exam: Filed Vitals:   04/07/15 1319  BP: 140/76  Pulse: 66  Temp: 97.1 F (36.2 C)  Resp: 18  Weight: 176 lb 9.6 oz (80.105 kg)  SpO2: 97%    General- elderly male, in no acute distress Head- normocephalic, atraumatic Throat- moist mucus membrane Eyes- PERRLA, EOMI, no pallor, no icterus, no discharge, normal conjunctiva, normal sclera Neck- no cervical lymphadenopathy Cardiovascular- normal s1,s2, no murmurs, palpable dorsalis pedis and radial pulses, no leg edema Respiratory- bilateral clear to auscultation, no wheeze, no rhonchi, no crackles, no use of accessory muscles Abdomen- bowel sounds present, soft, non  tender Musculoskeletal- able to move all 4 extremities, generalized weakness of lower extremities> upper extremities  Neurological- no focal deficit Skin- warm and dry, surgical incision on abdominal wall with homeycomb dressing, clean and dry. Has honeycomb dressing on spinal region over thoraco - lumbar region. Has 3 surgical incision on right lateral abdominal wall, well opposed with glue, healing well Psychiatry- alert and oriented to person, place and time, normal mood and affect    Labs reviewed: Basic Metabolic Panel:  Recent Labs  04/16/14 1103 03/16/15 0927 03/27/15 0842 03/27/15 1440  NA 140 139 135 135  K 5.1 4.4 4.0 4.6  CL 102 103  --   --   CO2 31 26  --   --   GLUCOSE 111* 96  --  149*  BUN 16 13  --   --   CREATININE 0.7 0.49*  --   --   CALCIUM 9.6 9.4  --   --    Liver Function Tests:  Recent Labs  04/16/14 1103  AST 28  ALT 30  ALKPHOS 59  BILITOT 1.0  PROT 6.7  ALBUMIN 4.2   No results for input(s): LIPASE, AMYLASE in the last 8760 hours. No results for input(s): AMMONIA in the last 8760 hours. CBC:  Recent Labs  04/16/14 1103 03/16/15 0927 03/27/15 0842 03/27/15 1440  WBC 7.5 8.5  --   --   NEUTROABS 4.8  --   --   --   HGB 14.5 15.1 11.9* 8.8*  HCT  43.7 45.3 35.0* 26.0*  MCV 93.6 91.0  --   --   PLT 246.0 240  --   --    CBG:  Recent Labs  03/24/15 0558 03/24/15 1304 03/27/15 1513  GLUCAP 128* 155* 137*     Assessment/Plan  Physical deconditioning Will have him work with physical therapy and occupational therapy team to help with gait training and muscle strengthening exercises.fall precautions. Skin care. Encourage to be out of bed.   Spondylolisthesis of the lumbosacral region S/p lumbar interbody fusion and T10 to iliac fusion. Start aleve 200 mg 2 tab q8h prn for pain and change vicodin to 5/325 1 tab q12 h prn pain on patient's request. Back precautions. Continue Robaxin 500 mg q6h prn muscle spasm  Idiopathic Scoliosis S/p staged surgeries. See above.    Blood loss anemia Post op, check h&h  Constipation Stable, continue current regimen of OPC 3, isochrone, lactulose and dulcolax suppository. Monitor clinically  Allergic rhinitis continue Claritin 10 mg daily prn    Goals of care: short term rehabilitation   Labs/tests ordered: cbc  Family/ staff Communication: reviewed care plan with patient and nursing supervisor    Blanchie Serve, MD  [facility] [phone] (Monday-Friday 8 am - 5 pm) [phone] (afterhours)

## 2015-04-09 ENCOUNTER — Other Ambulatory Visit: Payer: Self-pay | Admitting: *Deleted

## 2015-04-09 MED ORDER — OXYCODONE-ACETAMINOPHEN 5-325 MG PO TABS: ORAL_TABLET | ORAL | Status: DC

## 2015-04-09 NOTE — Telephone Encounter (Signed)
[facility] 

## 2015-04-15 DIAGNOSIS — M4317 Spondylolisthesis, lumbosacral region: Secondary | ICD-10-CM | POA: Diagnosis not present

## 2015-04-15 DIAGNOSIS — M4120 Other idiopathic scoliosis, site unspecified: Secondary | ICD-10-CM | POA: Diagnosis not present

## 2015-04-15 DIAGNOSIS — M5416 Radiculopathy, lumbar region: Secondary | ICD-10-CM | POA: Diagnosis not present

## 2015-04-15 DIAGNOSIS — M419 Scoliosis, unspecified: Secondary | ICD-10-CM | POA: Diagnosis not present

## 2015-04-16 ENCOUNTER — Encounter (HOSPITAL_COMMUNITY): Payer: Self-pay | Admitting: Neurosurgery

## 2015-04-17 ENCOUNTER — Encounter (HOSPITAL_COMMUNITY): Payer: Self-pay | Admitting: Neurosurgery

## 2015-04-24 ENCOUNTER — Encounter: Payer: Medicare Other | Admitting: Family Medicine

## 2015-04-25 DIAGNOSIS — Z4789 Encounter for other orthopedic aftercare: Secondary | ICD-10-CM | POA: Diagnosis not present

## 2015-04-25 DIAGNOSIS — K219 Gastro-esophageal reflux disease without esophagitis: Secondary | ICD-10-CM | POA: Diagnosis not present

## 2015-04-27 DIAGNOSIS — Z4789 Encounter for other orthopedic aftercare: Secondary | ICD-10-CM | POA: Diagnosis not present

## 2015-04-27 DIAGNOSIS — K219 Gastro-esophageal reflux disease without esophagitis: Secondary | ICD-10-CM | POA: Diagnosis not present

## 2015-04-28 DIAGNOSIS — K219 Gastro-esophageal reflux disease without esophagitis: Secondary | ICD-10-CM | POA: Diagnosis not present

## 2015-04-28 DIAGNOSIS — Z4789 Encounter for other orthopedic aftercare: Secondary | ICD-10-CM | POA: Diagnosis not present

## 2015-04-29 DIAGNOSIS — K219 Gastro-esophageal reflux disease without esophagitis: Secondary | ICD-10-CM | POA: Diagnosis not present

## 2015-04-29 DIAGNOSIS — Z4789 Encounter for other orthopedic aftercare: Secondary | ICD-10-CM | POA: Diagnosis not present

## 2015-04-30 DIAGNOSIS — Z4789 Encounter for other orthopedic aftercare: Secondary | ICD-10-CM | POA: Diagnosis not present

## 2015-04-30 DIAGNOSIS — K219 Gastro-esophageal reflux disease without esophagitis: Secondary | ICD-10-CM | POA: Diagnosis not present

## 2015-05-01 DIAGNOSIS — K219 Gastro-esophageal reflux disease without esophagitis: Secondary | ICD-10-CM | POA: Diagnosis not present

## 2015-05-01 DIAGNOSIS — Z4789 Encounter for other orthopedic aftercare: Secondary | ICD-10-CM | POA: Diagnosis not present

## 2015-05-04 DIAGNOSIS — K219 Gastro-esophageal reflux disease without esophagitis: Secondary | ICD-10-CM | POA: Diagnosis not present

## 2015-05-04 DIAGNOSIS — Z4789 Encounter for other orthopedic aftercare: Secondary | ICD-10-CM | POA: Diagnosis not present

## 2015-05-05 DIAGNOSIS — Z4789 Encounter for other orthopedic aftercare: Secondary | ICD-10-CM | POA: Diagnosis not present

## 2015-05-05 DIAGNOSIS — K219 Gastro-esophageal reflux disease without esophagitis: Secondary | ICD-10-CM | POA: Diagnosis not present

## 2015-05-06 DIAGNOSIS — Z4789 Encounter for other orthopedic aftercare: Secondary | ICD-10-CM | POA: Diagnosis not present

## 2015-05-06 DIAGNOSIS — K219 Gastro-esophageal reflux disease without esophagitis: Secondary | ICD-10-CM | POA: Diagnosis not present

## 2015-05-07 DIAGNOSIS — Z4789 Encounter for other orthopedic aftercare: Secondary | ICD-10-CM | POA: Diagnosis not present

## 2015-05-07 DIAGNOSIS — K219 Gastro-esophageal reflux disease without esophagitis: Secondary | ICD-10-CM | POA: Diagnosis not present

## 2015-05-08 DIAGNOSIS — Z4789 Encounter for other orthopedic aftercare: Secondary | ICD-10-CM | POA: Diagnosis not present

## 2015-05-08 DIAGNOSIS — K219 Gastro-esophageal reflux disease without esophagitis: Secondary | ICD-10-CM | POA: Diagnosis not present

## 2015-05-12 DIAGNOSIS — Z4789 Encounter for other orthopedic aftercare: Secondary | ICD-10-CM | POA: Diagnosis not present

## 2015-05-12 DIAGNOSIS — K219 Gastro-esophageal reflux disease without esophagitis: Secondary | ICD-10-CM | POA: Diagnosis not present

## 2015-05-14 DIAGNOSIS — Z4789 Encounter for other orthopedic aftercare: Secondary | ICD-10-CM | POA: Diagnosis not present

## 2015-05-14 DIAGNOSIS — K219 Gastro-esophageal reflux disease without esophagitis: Secondary | ICD-10-CM | POA: Diagnosis not present

## 2015-05-19 DIAGNOSIS — Z4789 Encounter for other orthopedic aftercare: Secondary | ICD-10-CM | POA: Diagnosis not present

## 2015-05-19 DIAGNOSIS — K219 Gastro-esophageal reflux disease without esophagitis: Secondary | ICD-10-CM | POA: Diagnosis not present

## 2015-05-20 DIAGNOSIS — R030 Elevated blood-pressure reading, without diagnosis of hypertension: Secondary | ICD-10-CM | POA: Diagnosis not present

## 2015-05-20 DIAGNOSIS — M4120 Other idiopathic scoliosis, site unspecified: Secondary | ICD-10-CM | POA: Diagnosis not present

## 2015-05-20 DIAGNOSIS — M419 Scoliosis, unspecified: Secondary | ICD-10-CM | POA: Diagnosis not present

## 2015-05-20 DIAGNOSIS — M4317 Spondylolisthesis, lumbosacral region: Secondary | ICD-10-CM | POA: Diagnosis not present

## 2015-05-20 DIAGNOSIS — Z6825 Body mass index (BMI) 25.0-25.9, adult: Secondary | ICD-10-CM | POA: Diagnosis not present

## 2015-05-21 DIAGNOSIS — K219 Gastro-esophageal reflux disease without esophagitis: Secondary | ICD-10-CM | POA: Diagnosis not present

## 2015-05-21 DIAGNOSIS — Z4789 Encounter for other orthopedic aftercare: Secondary | ICD-10-CM | POA: Diagnosis not present

## 2015-05-26 DIAGNOSIS — Z4789 Encounter for other orthopedic aftercare: Secondary | ICD-10-CM | POA: Diagnosis not present

## 2015-05-26 DIAGNOSIS — K219 Gastro-esophageal reflux disease without esophagitis: Secondary | ICD-10-CM | POA: Diagnosis not present

## 2015-05-28 DIAGNOSIS — Z4789 Encounter for other orthopedic aftercare: Secondary | ICD-10-CM | POA: Diagnosis not present

## 2015-05-28 DIAGNOSIS — K219 Gastro-esophageal reflux disease without esophagitis: Secondary | ICD-10-CM | POA: Diagnosis not present

## 2015-06-02 DIAGNOSIS — Z4789 Encounter for other orthopedic aftercare: Secondary | ICD-10-CM | POA: Diagnosis not present

## 2015-06-02 DIAGNOSIS — K219 Gastro-esophageal reflux disease without esophagitis: Secondary | ICD-10-CM | POA: Diagnosis not present

## 2015-06-05 DIAGNOSIS — K219 Gastro-esophageal reflux disease without esophagitis: Secondary | ICD-10-CM | POA: Diagnosis not present

## 2015-06-05 DIAGNOSIS — Z4789 Encounter for other orthopedic aftercare: Secondary | ICD-10-CM | POA: Diagnosis not present

## 2015-06-09 DIAGNOSIS — K219 Gastro-esophageal reflux disease without esophagitis: Secondary | ICD-10-CM | POA: Diagnosis not present

## 2015-06-09 DIAGNOSIS — Z4789 Encounter for other orthopedic aftercare: Secondary | ICD-10-CM | POA: Diagnosis not present

## 2015-06-11 DIAGNOSIS — K219 Gastro-esophageal reflux disease without esophagitis: Secondary | ICD-10-CM | POA: Diagnosis not present

## 2015-06-11 DIAGNOSIS — Z4789 Encounter for other orthopedic aftercare: Secondary | ICD-10-CM | POA: Diagnosis not present

## 2015-06-16 DIAGNOSIS — Z4789 Encounter for other orthopedic aftercare: Secondary | ICD-10-CM | POA: Diagnosis not present

## 2015-06-16 DIAGNOSIS — K219 Gastro-esophageal reflux disease without esophagitis: Secondary | ICD-10-CM | POA: Diagnosis not present

## 2015-06-18 DIAGNOSIS — Z4789 Encounter for other orthopedic aftercare: Secondary | ICD-10-CM | POA: Diagnosis not present

## 2015-06-18 DIAGNOSIS — K219 Gastro-esophageal reflux disease without esophagitis: Secondary | ICD-10-CM | POA: Diagnosis not present

## 2015-06-23 DIAGNOSIS — K219 Gastro-esophageal reflux disease without esophagitis: Secondary | ICD-10-CM | POA: Diagnosis not present

## 2015-06-23 DIAGNOSIS — Z4789 Encounter for other orthopedic aftercare: Secondary | ICD-10-CM | POA: Diagnosis not present

## 2015-06-24 DIAGNOSIS — Z4789 Encounter for other orthopedic aftercare: Secondary | ICD-10-CM | POA: Diagnosis not present

## 2015-06-24 DIAGNOSIS — R260 Ataxic gait: Secondary | ICD-10-CM | POA: Diagnosis not present

## 2015-06-24 DIAGNOSIS — K219 Gastro-esophageal reflux disease without esophagitis: Secondary | ICD-10-CM | POA: Diagnosis not present

## 2015-06-24 DIAGNOSIS — R531 Weakness: Secondary | ICD-10-CM | POA: Diagnosis not present

## 2015-06-30 DIAGNOSIS — R531 Weakness: Secondary | ICD-10-CM | POA: Diagnosis not present

## 2015-06-30 DIAGNOSIS — Z4789 Encounter for other orthopedic aftercare: Secondary | ICD-10-CM | POA: Diagnosis not present

## 2015-06-30 DIAGNOSIS — R260 Ataxic gait: Secondary | ICD-10-CM | POA: Diagnosis not present

## 2015-06-30 DIAGNOSIS — K219 Gastro-esophageal reflux disease without esophagitis: Secondary | ICD-10-CM | POA: Diagnosis not present

## 2015-07-02 DIAGNOSIS — Z4789 Encounter for other orthopedic aftercare: Secondary | ICD-10-CM | POA: Diagnosis not present

## 2015-07-02 DIAGNOSIS — K219 Gastro-esophageal reflux disease without esophagitis: Secondary | ICD-10-CM | POA: Diagnosis not present

## 2015-07-02 DIAGNOSIS — R260 Ataxic gait: Secondary | ICD-10-CM | POA: Diagnosis not present

## 2015-07-02 DIAGNOSIS — R531 Weakness: Secondary | ICD-10-CM | POA: Diagnosis not present

## 2015-07-07 DIAGNOSIS — R531 Weakness: Secondary | ICD-10-CM | POA: Diagnosis not present

## 2015-07-07 DIAGNOSIS — Z4789 Encounter for other orthopedic aftercare: Secondary | ICD-10-CM | POA: Diagnosis not present

## 2015-07-07 DIAGNOSIS — K219 Gastro-esophageal reflux disease without esophagitis: Secondary | ICD-10-CM | POA: Diagnosis not present

## 2015-07-07 DIAGNOSIS — R260 Ataxic gait: Secondary | ICD-10-CM | POA: Diagnosis not present

## 2015-07-08 DIAGNOSIS — R300 Dysuria: Secondary | ICD-10-CM | POA: Diagnosis not present

## 2015-07-08 DIAGNOSIS — R312 Other microscopic hematuria: Secondary | ICD-10-CM | POA: Diagnosis not present

## 2015-07-08 DIAGNOSIS — Z87442 Personal history of urinary calculi: Secondary | ICD-10-CM | POA: Diagnosis not present

## 2015-07-08 DIAGNOSIS — M545 Low back pain: Secondary | ICD-10-CM | POA: Diagnosis not present

## 2015-07-09 DIAGNOSIS — R531 Weakness: Secondary | ICD-10-CM | POA: Diagnosis not present

## 2015-07-09 DIAGNOSIS — Z4789 Encounter for other orthopedic aftercare: Secondary | ICD-10-CM | POA: Diagnosis not present

## 2015-07-09 DIAGNOSIS — K219 Gastro-esophageal reflux disease without esophagitis: Secondary | ICD-10-CM | POA: Diagnosis not present

## 2015-07-09 DIAGNOSIS — R260 Ataxic gait: Secondary | ICD-10-CM | POA: Diagnosis not present

## 2015-07-13 DIAGNOSIS — Z4789 Encounter for other orthopedic aftercare: Secondary | ICD-10-CM | POA: Diagnosis not present

## 2015-07-13 DIAGNOSIS — R531 Weakness: Secondary | ICD-10-CM | POA: Diagnosis not present

## 2015-07-13 DIAGNOSIS — K219 Gastro-esophageal reflux disease without esophagitis: Secondary | ICD-10-CM | POA: Diagnosis not present

## 2015-07-13 DIAGNOSIS — R260 Ataxic gait: Secondary | ICD-10-CM | POA: Diagnosis not present

## 2015-07-15 DIAGNOSIS — M4317 Spondylolisthesis, lumbosacral region: Secondary | ICD-10-CM | POA: Diagnosis not present

## 2015-07-15 DIAGNOSIS — M5416 Radiculopathy, lumbar region: Secondary | ICD-10-CM | POA: Diagnosis not present

## 2015-07-15 DIAGNOSIS — R030 Elevated blood-pressure reading, without diagnosis of hypertension: Secondary | ICD-10-CM | POA: Diagnosis not present

## 2015-07-15 DIAGNOSIS — M419 Scoliosis, unspecified: Secondary | ICD-10-CM | POA: Diagnosis not present

## 2015-07-15 DIAGNOSIS — M4120 Other idiopathic scoliosis, site unspecified: Secondary | ICD-10-CM | POA: Diagnosis not present

## 2015-07-16 DIAGNOSIS — Z4789 Encounter for other orthopedic aftercare: Secondary | ICD-10-CM | POA: Diagnosis not present

## 2015-07-16 DIAGNOSIS — R531 Weakness: Secondary | ICD-10-CM | POA: Diagnosis not present

## 2015-07-16 DIAGNOSIS — R260 Ataxic gait: Secondary | ICD-10-CM | POA: Diagnosis not present

## 2015-07-16 DIAGNOSIS — K219 Gastro-esophageal reflux disease without esophagitis: Secondary | ICD-10-CM | POA: Diagnosis not present

## 2015-07-21 DIAGNOSIS — R531 Weakness: Secondary | ICD-10-CM | POA: Diagnosis not present

## 2015-07-21 DIAGNOSIS — R260 Ataxic gait: Secondary | ICD-10-CM | POA: Diagnosis not present

## 2015-07-21 DIAGNOSIS — Z4789 Encounter for other orthopedic aftercare: Secondary | ICD-10-CM | POA: Diagnosis not present

## 2015-07-21 DIAGNOSIS — K219 Gastro-esophageal reflux disease without esophagitis: Secondary | ICD-10-CM | POA: Diagnosis not present

## 2015-07-24 DIAGNOSIS — R260 Ataxic gait: Secondary | ICD-10-CM | POA: Diagnosis not present

## 2015-07-24 DIAGNOSIS — R531 Weakness: Secondary | ICD-10-CM | POA: Diagnosis not present

## 2015-07-24 DIAGNOSIS — K219 Gastro-esophageal reflux disease without esophagitis: Secondary | ICD-10-CM | POA: Diagnosis not present

## 2015-07-24 DIAGNOSIS — Z4789 Encounter for other orthopedic aftercare: Secondary | ICD-10-CM | POA: Diagnosis not present

## 2015-07-28 DIAGNOSIS — K219 Gastro-esophageal reflux disease without esophagitis: Secondary | ICD-10-CM | POA: Diagnosis not present

## 2015-07-28 DIAGNOSIS — R260 Ataxic gait: Secondary | ICD-10-CM | POA: Diagnosis not present

## 2015-07-28 DIAGNOSIS — Z4789 Encounter for other orthopedic aftercare: Secondary | ICD-10-CM | POA: Diagnosis not present

## 2015-07-28 DIAGNOSIS — R531 Weakness: Secondary | ICD-10-CM | POA: Diagnosis not present

## 2015-07-30 DIAGNOSIS — Z4789 Encounter for other orthopedic aftercare: Secondary | ICD-10-CM | POA: Diagnosis not present

## 2015-07-30 DIAGNOSIS — R260 Ataxic gait: Secondary | ICD-10-CM | POA: Diagnosis not present

## 2015-07-30 DIAGNOSIS — R531 Weakness: Secondary | ICD-10-CM | POA: Diagnosis not present

## 2015-07-30 DIAGNOSIS — K219 Gastro-esophageal reflux disease without esophagitis: Secondary | ICD-10-CM | POA: Diagnosis not present

## 2015-08-04 DIAGNOSIS — R531 Weakness: Secondary | ICD-10-CM | POA: Diagnosis not present

## 2015-08-04 DIAGNOSIS — R260 Ataxic gait: Secondary | ICD-10-CM | POA: Diagnosis not present

## 2015-08-04 DIAGNOSIS — K219 Gastro-esophageal reflux disease without esophagitis: Secondary | ICD-10-CM | POA: Diagnosis not present

## 2015-08-04 DIAGNOSIS — Z4789 Encounter for other orthopedic aftercare: Secondary | ICD-10-CM | POA: Diagnosis not present

## 2015-08-06 DIAGNOSIS — K219 Gastro-esophageal reflux disease without esophagitis: Secondary | ICD-10-CM | POA: Diagnosis not present

## 2015-08-06 DIAGNOSIS — R260 Ataxic gait: Secondary | ICD-10-CM | POA: Diagnosis not present

## 2015-08-06 DIAGNOSIS — Z4789 Encounter for other orthopedic aftercare: Secondary | ICD-10-CM | POA: Diagnosis not present

## 2015-08-06 DIAGNOSIS — R531 Weakness: Secondary | ICD-10-CM | POA: Diagnosis not present

## 2015-08-11 DIAGNOSIS — Z4789 Encounter for other orthopedic aftercare: Secondary | ICD-10-CM | POA: Diagnosis not present

## 2015-08-11 DIAGNOSIS — R531 Weakness: Secondary | ICD-10-CM | POA: Diagnosis not present

## 2015-08-11 DIAGNOSIS — R260 Ataxic gait: Secondary | ICD-10-CM | POA: Diagnosis not present

## 2015-08-11 DIAGNOSIS — K219 Gastro-esophageal reflux disease without esophagitis: Secondary | ICD-10-CM | POA: Diagnosis not present

## 2015-08-13 DIAGNOSIS — Z4789 Encounter for other orthopedic aftercare: Secondary | ICD-10-CM | POA: Diagnosis not present

## 2015-08-13 DIAGNOSIS — K219 Gastro-esophageal reflux disease without esophagitis: Secondary | ICD-10-CM | POA: Diagnosis not present

## 2015-08-13 DIAGNOSIS — R260 Ataxic gait: Secondary | ICD-10-CM | POA: Diagnosis not present

## 2015-08-13 DIAGNOSIS — R531 Weakness: Secondary | ICD-10-CM | POA: Diagnosis not present

## 2015-08-18 DIAGNOSIS — R260 Ataxic gait: Secondary | ICD-10-CM | POA: Diagnosis not present

## 2015-08-18 DIAGNOSIS — R531 Weakness: Secondary | ICD-10-CM | POA: Diagnosis not present

## 2015-08-18 DIAGNOSIS — K219 Gastro-esophageal reflux disease without esophagitis: Secondary | ICD-10-CM | POA: Diagnosis not present

## 2015-08-18 DIAGNOSIS — Z4789 Encounter for other orthopedic aftercare: Secondary | ICD-10-CM | POA: Diagnosis not present

## 2015-08-19 DIAGNOSIS — R531 Weakness: Secondary | ICD-10-CM | POA: Diagnosis not present

## 2015-08-19 DIAGNOSIS — R260 Ataxic gait: Secondary | ICD-10-CM | POA: Diagnosis not present

## 2015-08-19 DIAGNOSIS — Z4789 Encounter for other orthopedic aftercare: Secondary | ICD-10-CM | POA: Diagnosis not present

## 2015-08-19 DIAGNOSIS — K219 Gastro-esophageal reflux disease without esophagitis: Secondary | ICD-10-CM | POA: Diagnosis not present

## 2015-08-23 DIAGNOSIS — K219 Gastro-esophageal reflux disease without esophagitis: Secondary | ICD-10-CM | POA: Diagnosis not present

## 2015-08-23 DIAGNOSIS — R531 Weakness: Secondary | ICD-10-CM | POA: Diagnosis not present

## 2015-08-23 DIAGNOSIS — Z4789 Encounter for other orthopedic aftercare: Secondary | ICD-10-CM | POA: Diagnosis not present

## 2015-08-23 DIAGNOSIS — R260 Ataxic gait: Secondary | ICD-10-CM | POA: Diagnosis not present

## 2015-08-25 DIAGNOSIS — R260 Ataxic gait: Secondary | ICD-10-CM | POA: Diagnosis not present

## 2015-08-25 DIAGNOSIS — K219 Gastro-esophageal reflux disease without esophagitis: Secondary | ICD-10-CM | POA: Diagnosis not present

## 2015-08-25 DIAGNOSIS — Z4789 Encounter for other orthopedic aftercare: Secondary | ICD-10-CM | POA: Diagnosis not present

## 2015-08-25 DIAGNOSIS — R531 Weakness: Secondary | ICD-10-CM | POA: Diagnosis not present

## 2015-08-27 DIAGNOSIS — K219 Gastro-esophageal reflux disease without esophagitis: Secondary | ICD-10-CM | POA: Diagnosis not present

## 2015-08-27 DIAGNOSIS — R531 Weakness: Secondary | ICD-10-CM | POA: Diagnosis not present

## 2015-08-27 DIAGNOSIS — Z4789 Encounter for other orthopedic aftercare: Secondary | ICD-10-CM | POA: Diagnosis not present

## 2015-08-27 DIAGNOSIS — R260 Ataxic gait: Secondary | ICD-10-CM | POA: Diagnosis not present

## 2015-09-01 DIAGNOSIS — R531 Weakness: Secondary | ICD-10-CM | POA: Diagnosis not present

## 2015-09-01 DIAGNOSIS — Z4789 Encounter for other orthopedic aftercare: Secondary | ICD-10-CM | POA: Diagnosis not present

## 2015-09-01 DIAGNOSIS — K219 Gastro-esophageal reflux disease without esophagitis: Secondary | ICD-10-CM | POA: Diagnosis not present

## 2015-09-01 DIAGNOSIS — R260 Ataxic gait: Secondary | ICD-10-CM | POA: Diagnosis not present

## 2015-09-03 DIAGNOSIS — Z4789 Encounter for other orthopedic aftercare: Secondary | ICD-10-CM | POA: Diagnosis not present

## 2015-09-03 DIAGNOSIS — R260 Ataxic gait: Secondary | ICD-10-CM | POA: Diagnosis not present

## 2015-09-03 DIAGNOSIS — R531 Weakness: Secondary | ICD-10-CM | POA: Diagnosis not present

## 2015-09-03 DIAGNOSIS — K219 Gastro-esophageal reflux disease without esophagitis: Secondary | ICD-10-CM | POA: Diagnosis not present

## 2015-09-08 DIAGNOSIS — K219 Gastro-esophageal reflux disease without esophagitis: Secondary | ICD-10-CM | POA: Diagnosis not present

## 2015-09-08 DIAGNOSIS — Z4789 Encounter for other orthopedic aftercare: Secondary | ICD-10-CM | POA: Diagnosis not present

## 2015-09-08 DIAGNOSIS — R260 Ataxic gait: Secondary | ICD-10-CM | POA: Diagnosis not present

## 2015-09-08 DIAGNOSIS — R531 Weakness: Secondary | ICD-10-CM | POA: Diagnosis not present

## 2015-09-10 DIAGNOSIS — K219 Gastro-esophageal reflux disease without esophagitis: Secondary | ICD-10-CM | POA: Diagnosis not present

## 2015-09-10 DIAGNOSIS — R531 Weakness: Secondary | ICD-10-CM | POA: Diagnosis not present

## 2015-09-10 DIAGNOSIS — Z4789 Encounter for other orthopedic aftercare: Secondary | ICD-10-CM | POA: Diagnosis not present

## 2015-09-10 DIAGNOSIS — R260 Ataxic gait: Secondary | ICD-10-CM | POA: Diagnosis not present

## 2015-09-14 DIAGNOSIS — M419 Scoliosis, unspecified: Secondary | ICD-10-CM | POA: Diagnosis not present

## 2015-09-14 DIAGNOSIS — R030 Elevated blood-pressure reading, without diagnosis of hypertension: Secondary | ICD-10-CM | POA: Diagnosis not present

## 2015-09-14 DIAGNOSIS — M4120 Other idiopathic scoliosis, site unspecified: Secondary | ICD-10-CM | POA: Diagnosis not present

## 2015-09-14 DIAGNOSIS — M5416 Radiculopathy, lumbar region: Secondary | ICD-10-CM | POA: Diagnosis not present

## 2015-09-15 DIAGNOSIS — K219 Gastro-esophageal reflux disease without esophagitis: Secondary | ICD-10-CM | POA: Diagnosis not present

## 2015-09-15 DIAGNOSIS — R531 Weakness: Secondary | ICD-10-CM | POA: Diagnosis not present

## 2015-09-15 DIAGNOSIS — Z4789 Encounter for other orthopedic aftercare: Secondary | ICD-10-CM | POA: Diagnosis not present

## 2015-09-15 DIAGNOSIS — R260 Ataxic gait: Secondary | ICD-10-CM | POA: Diagnosis not present

## 2015-09-18 DIAGNOSIS — Z4789 Encounter for other orthopedic aftercare: Secondary | ICD-10-CM | POA: Diagnosis not present

## 2015-09-18 DIAGNOSIS — R260 Ataxic gait: Secondary | ICD-10-CM | POA: Diagnosis not present

## 2015-09-18 DIAGNOSIS — K219 Gastro-esophageal reflux disease without esophagitis: Secondary | ICD-10-CM | POA: Diagnosis not present

## 2015-09-18 DIAGNOSIS — R531 Weakness: Secondary | ICD-10-CM | POA: Diagnosis not present

## 2015-09-22 DIAGNOSIS — R260 Ataxic gait: Secondary | ICD-10-CM | POA: Diagnosis not present

## 2015-09-22 DIAGNOSIS — K219 Gastro-esophageal reflux disease without esophagitis: Secondary | ICD-10-CM | POA: Diagnosis not present

## 2015-09-22 DIAGNOSIS — Z4789 Encounter for other orthopedic aftercare: Secondary | ICD-10-CM | POA: Diagnosis not present

## 2015-09-22 DIAGNOSIS — R531 Weakness: Secondary | ICD-10-CM | POA: Diagnosis not present

## 2015-09-24 DIAGNOSIS — Z4789 Encounter for other orthopedic aftercare: Secondary | ICD-10-CM | POA: Diagnosis not present

## 2015-09-24 DIAGNOSIS — K219 Gastro-esophageal reflux disease without esophagitis: Secondary | ICD-10-CM | POA: Diagnosis not present

## 2015-09-24 DIAGNOSIS — R531 Weakness: Secondary | ICD-10-CM | POA: Diagnosis not present

## 2015-09-24 DIAGNOSIS — R260 Ataxic gait: Secondary | ICD-10-CM | POA: Diagnosis not present

## 2015-09-29 DIAGNOSIS — R531 Weakness: Secondary | ICD-10-CM | POA: Diagnosis not present

## 2015-09-29 DIAGNOSIS — Z4789 Encounter for other orthopedic aftercare: Secondary | ICD-10-CM | POA: Diagnosis not present

## 2015-09-29 DIAGNOSIS — R260 Ataxic gait: Secondary | ICD-10-CM | POA: Diagnosis not present

## 2015-09-29 DIAGNOSIS — K219 Gastro-esophageal reflux disease without esophagitis: Secondary | ICD-10-CM | POA: Diagnosis not present

## 2015-10-01 DIAGNOSIS — R531 Weakness: Secondary | ICD-10-CM | POA: Diagnosis not present

## 2015-10-01 DIAGNOSIS — Z4789 Encounter for other orthopedic aftercare: Secondary | ICD-10-CM | POA: Diagnosis not present

## 2015-10-01 DIAGNOSIS — K219 Gastro-esophageal reflux disease without esophagitis: Secondary | ICD-10-CM | POA: Diagnosis not present

## 2015-10-01 DIAGNOSIS — R260 Ataxic gait: Secondary | ICD-10-CM | POA: Diagnosis not present

## 2015-10-06 DIAGNOSIS — R260 Ataxic gait: Secondary | ICD-10-CM | POA: Diagnosis not present

## 2015-10-06 DIAGNOSIS — K219 Gastro-esophageal reflux disease without esophagitis: Secondary | ICD-10-CM | POA: Diagnosis not present

## 2015-10-06 DIAGNOSIS — R531 Weakness: Secondary | ICD-10-CM | POA: Diagnosis not present

## 2015-10-06 DIAGNOSIS — Z4789 Encounter for other orthopedic aftercare: Secondary | ICD-10-CM | POA: Diagnosis not present

## 2015-10-13 DIAGNOSIS — K219 Gastro-esophageal reflux disease without esophagitis: Secondary | ICD-10-CM | POA: Diagnosis not present

## 2015-10-13 DIAGNOSIS — Z4789 Encounter for other orthopedic aftercare: Secondary | ICD-10-CM | POA: Diagnosis not present

## 2015-10-13 DIAGNOSIS — R260 Ataxic gait: Secondary | ICD-10-CM | POA: Diagnosis not present

## 2015-10-13 DIAGNOSIS — R531 Weakness: Secondary | ICD-10-CM | POA: Diagnosis not present

## 2015-10-15 DIAGNOSIS — R531 Weakness: Secondary | ICD-10-CM | POA: Diagnosis not present

## 2015-10-15 DIAGNOSIS — Z4789 Encounter for other orthopedic aftercare: Secondary | ICD-10-CM | POA: Diagnosis not present

## 2015-10-15 DIAGNOSIS — K219 Gastro-esophageal reflux disease without esophagitis: Secondary | ICD-10-CM | POA: Diagnosis not present

## 2015-10-15 DIAGNOSIS — R260 Ataxic gait: Secondary | ICD-10-CM | POA: Diagnosis not present

## 2015-10-20 DIAGNOSIS — R260 Ataxic gait: Secondary | ICD-10-CM | POA: Diagnosis not present

## 2015-10-20 DIAGNOSIS — K219 Gastro-esophageal reflux disease without esophagitis: Secondary | ICD-10-CM | POA: Diagnosis not present

## 2015-10-20 DIAGNOSIS — Z4789 Encounter for other orthopedic aftercare: Secondary | ICD-10-CM | POA: Diagnosis not present

## 2015-10-20 DIAGNOSIS — R531 Weakness: Secondary | ICD-10-CM | POA: Diagnosis not present

## 2015-10-21 DIAGNOSIS — R260 Ataxic gait: Secondary | ICD-10-CM | POA: Diagnosis not present

## 2015-10-21 DIAGNOSIS — R531 Weakness: Secondary | ICD-10-CM | POA: Diagnosis not present

## 2015-10-21 DIAGNOSIS — K219 Gastro-esophageal reflux disease without esophagitis: Secondary | ICD-10-CM | POA: Diagnosis not present

## 2015-10-21 DIAGNOSIS — Z4789 Encounter for other orthopedic aftercare: Secondary | ICD-10-CM | POA: Diagnosis not present

## 2015-10-22 DIAGNOSIS — Z4789 Encounter for other orthopedic aftercare: Secondary | ICD-10-CM | POA: Diagnosis not present

## 2015-10-22 DIAGNOSIS — R531 Weakness: Secondary | ICD-10-CM | POA: Diagnosis not present

## 2015-10-22 DIAGNOSIS — R260 Ataxic gait: Secondary | ICD-10-CM | POA: Diagnosis not present

## 2015-10-22 DIAGNOSIS — K219 Gastro-esophageal reflux disease without esophagitis: Secondary | ICD-10-CM | POA: Diagnosis not present

## 2015-10-27 DIAGNOSIS — Z4789 Encounter for other orthopedic aftercare: Secondary | ICD-10-CM | POA: Diagnosis not present

## 2015-10-27 DIAGNOSIS — K219 Gastro-esophageal reflux disease without esophagitis: Secondary | ICD-10-CM | POA: Diagnosis not present

## 2015-10-27 DIAGNOSIS — R260 Ataxic gait: Secondary | ICD-10-CM | POA: Diagnosis not present

## 2015-10-27 DIAGNOSIS — R531 Weakness: Secondary | ICD-10-CM | POA: Diagnosis not present

## 2015-10-30 DIAGNOSIS — K219 Gastro-esophageal reflux disease without esophagitis: Secondary | ICD-10-CM | POA: Diagnosis not present

## 2015-10-30 DIAGNOSIS — Z4789 Encounter for other orthopedic aftercare: Secondary | ICD-10-CM | POA: Diagnosis not present

## 2015-10-30 DIAGNOSIS — R260 Ataxic gait: Secondary | ICD-10-CM | POA: Diagnosis not present

## 2015-10-30 DIAGNOSIS — R531 Weakness: Secondary | ICD-10-CM | POA: Diagnosis not present

## 2015-11-03 DIAGNOSIS — R531 Weakness: Secondary | ICD-10-CM | POA: Diagnosis not present

## 2015-11-03 DIAGNOSIS — R260 Ataxic gait: Secondary | ICD-10-CM | POA: Diagnosis not present

## 2015-11-03 DIAGNOSIS — K219 Gastro-esophageal reflux disease without esophagitis: Secondary | ICD-10-CM | POA: Diagnosis not present

## 2015-11-03 DIAGNOSIS — Z4789 Encounter for other orthopedic aftercare: Secondary | ICD-10-CM | POA: Diagnosis not present

## 2015-11-05 DIAGNOSIS — K219 Gastro-esophageal reflux disease without esophagitis: Secondary | ICD-10-CM | POA: Diagnosis not present

## 2015-11-05 DIAGNOSIS — Z4789 Encounter for other orthopedic aftercare: Secondary | ICD-10-CM | POA: Diagnosis not present

## 2015-11-05 DIAGNOSIS — R531 Weakness: Secondary | ICD-10-CM | POA: Diagnosis not present

## 2015-11-05 DIAGNOSIS — R260 Ataxic gait: Secondary | ICD-10-CM | POA: Diagnosis not present

## 2015-11-11 DIAGNOSIS — Z4789 Encounter for other orthopedic aftercare: Secondary | ICD-10-CM | POA: Diagnosis not present

## 2015-11-11 DIAGNOSIS — R531 Weakness: Secondary | ICD-10-CM | POA: Diagnosis not present

## 2015-11-11 DIAGNOSIS — R260 Ataxic gait: Secondary | ICD-10-CM | POA: Diagnosis not present

## 2015-11-11 DIAGNOSIS — K219 Gastro-esophageal reflux disease without esophagitis: Secondary | ICD-10-CM | POA: Diagnosis not present

## 2015-11-12 DIAGNOSIS — Z4789 Encounter for other orthopedic aftercare: Secondary | ICD-10-CM | POA: Diagnosis not present

## 2015-11-12 DIAGNOSIS — R531 Weakness: Secondary | ICD-10-CM | POA: Diagnosis not present

## 2015-11-12 DIAGNOSIS — R260 Ataxic gait: Secondary | ICD-10-CM | POA: Diagnosis not present

## 2015-11-12 DIAGNOSIS — K219 Gastro-esophageal reflux disease without esophagitis: Secondary | ICD-10-CM | POA: Diagnosis not present

## 2015-11-17 DIAGNOSIS — R531 Weakness: Secondary | ICD-10-CM | POA: Diagnosis not present

## 2015-11-17 DIAGNOSIS — K219 Gastro-esophageal reflux disease without esophagitis: Secondary | ICD-10-CM | POA: Diagnosis not present

## 2015-11-17 DIAGNOSIS — R260 Ataxic gait: Secondary | ICD-10-CM | POA: Diagnosis not present

## 2015-11-17 DIAGNOSIS — Z4789 Encounter for other orthopedic aftercare: Secondary | ICD-10-CM | POA: Diagnosis not present

## 2015-11-18 DIAGNOSIS — R260 Ataxic gait: Secondary | ICD-10-CM | POA: Diagnosis not present

## 2015-11-18 DIAGNOSIS — Z4789 Encounter for other orthopedic aftercare: Secondary | ICD-10-CM | POA: Diagnosis not present

## 2015-11-18 DIAGNOSIS — K219 Gastro-esophageal reflux disease without esophagitis: Secondary | ICD-10-CM | POA: Diagnosis not present

## 2015-11-18 DIAGNOSIS — R531 Weakness: Secondary | ICD-10-CM | POA: Diagnosis not present

## 2015-11-24 DIAGNOSIS — R531 Weakness: Secondary | ICD-10-CM | POA: Diagnosis not present

## 2015-11-24 DIAGNOSIS — R260 Ataxic gait: Secondary | ICD-10-CM | POA: Diagnosis not present

## 2015-11-24 DIAGNOSIS — Z4789 Encounter for other orthopedic aftercare: Secondary | ICD-10-CM | POA: Diagnosis not present

## 2015-11-24 DIAGNOSIS — K219 Gastro-esophageal reflux disease without esophagitis: Secondary | ICD-10-CM | POA: Diagnosis not present

## 2015-11-26 DIAGNOSIS — R260 Ataxic gait: Secondary | ICD-10-CM | POA: Diagnosis not present

## 2015-11-26 DIAGNOSIS — K219 Gastro-esophageal reflux disease without esophagitis: Secondary | ICD-10-CM | POA: Diagnosis not present

## 2015-11-26 DIAGNOSIS — R531 Weakness: Secondary | ICD-10-CM | POA: Diagnosis not present

## 2015-11-26 DIAGNOSIS — Z4789 Encounter for other orthopedic aftercare: Secondary | ICD-10-CM | POA: Diagnosis not present

## 2015-12-01 DIAGNOSIS — R531 Weakness: Secondary | ICD-10-CM | POA: Diagnosis not present

## 2015-12-01 DIAGNOSIS — Z4789 Encounter for other orthopedic aftercare: Secondary | ICD-10-CM | POA: Diagnosis not present

## 2015-12-01 DIAGNOSIS — R260 Ataxic gait: Secondary | ICD-10-CM | POA: Diagnosis not present

## 2015-12-01 DIAGNOSIS — K219 Gastro-esophageal reflux disease without esophagitis: Secondary | ICD-10-CM | POA: Diagnosis not present

## 2015-12-04 DIAGNOSIS — Z4789 Encounter for other orthopedic aftercare: Secondary | ICD-10-CM | POA: Diagnosis not present

## 2015-12-04 DIAGNOSIS — R260 Ataxic gait: Secondary | ICD-10-CM | POA: Diagnosis not present

## 2015-12-04 DIAGNOSIS — K219 Gastro-esophageal reflux disease without esophagitis: Secondary | ICD-10-CM | POA: Diagnosis not present

## 2015-12-04 DIAGNOSIS — R531 Weakness: Secondary | ICD-10-CM | POA: Diagnosis not present

## 2015-12-08 DIAGNOSIS — R531 Weakness: Secondary | ICD-10-CM | POA: Diagnosis not present

## 2015-12-08 DIAGNOSIS — R260 Ataxic gait: Secondary | ICD-10-CM | POA: Diagnosis not present

## 2015-12-08 DIAGNOSIS — Z4789 Encounter for other orthopedic aftercare: Secondary | ICD-10-CM | POA: Diagnosis not present

## 2015-12-08 DIAGNOSIS — K219 Gastro-esophageal reflux disease without esophagitis: Secondary | ICD-10-CM | POA: Diagnosis not present

## 2015-12-10 DIAGNOSIS — K219 Gastro-esophageal reflux disease without esophagitis: Secondary | ICD-10-CM | POA: Diagnosis not present

## 2015-12-10 DIAGNOSIS — R531 Weakness: Secondary | ICD-10-CM | POA: Diagnosis not present

## 2015-12-10 DIAGNOSIS — Z4789 Encounter for other orthopedic aftercare: Secondary | ICD-10-CM | POA: Diagnosis not present

## 2015-12-10 DIAGNOSIS — R260 Ataxic gait: Secondary | ICD-10-CM | POA: Diagnosis not present

## 2015-12-14 DIAGNOSIS — M4317 Spondylolisthesis, lumbosacral region: Secondary | ICD-10-CM | POA: Diagnosis not present

## 2015-12-14 DIAGNOSIS — M419 Scoliosis, unspecified: Secondary | ICD-10-CM | POA: Diagnosis not present

## 2015-12-14 DIAGNOSIS — M5416 Radiculopathy, lumbar region: Secondary | ICD-10-CM | POA: Diagnosis not present

## 2015-12-14 DIAGNOSIS — M4120 Other idiopathic scoliosis, site unspecified: Secondary | ICD-10-CM | POA: Diagnosis not present

## 2015-12-15 DIAGNOSIS — Z4789 Encounter for other orthopedic aftercare: Secondary | ICD-10-CM | POA: Diagnosis not present

## 2015-12-15 DIAGNOSIS — K219 Gastro-esophageal reflux disease without esophagitis: Secondary | ICD-10-CM | POA: Diagnosis not present

## 2015-12-15 DIAGNOSIS — R531 Weakness: Secondary | ICD-10-CM | POA: Diagnosis not present

## 2015-12-15 DIAGNOSIS — R260 Ataxic gait: Secondary | ICD-10-CM | POA: Diagnosis not present

## 2015-12-18 DIAGNOSIS — K219 Gastro-esophageal reflux disease without esophagitis: Secondary | ICD-10-CM | POA: Diagnosis not present

## 2015-12-18 DIAGNOSIS — R260 Ataxic gait: Secondary | ICD-10-CM | POA: Diagnosis not present

## 2015-12-18 DIAGNOSIS — R531 Weakness: Secondary | ICD-10-CM | POA: Diagnosis not present

## 2015-12-18 DIAGNOSIS — Z4789 Encounter for other orthopedic aftercare: Secondary | ICD-10-CM | POA: Diagnosis not present

## 2015-12-21 DIAGNOSIS — K219 Gastro-esophageal reflux disease without esophagitis: Secondary | ICD-10-CM | POA: Diagnosis not present

## 2015-12-21 DIAGNOSIS — R531 Weakness: Secondary | ICD-10-CM | POA: Diagnosis not present

## 2015-12-21 DIAGNOSIS — Z4789 Encounter for other orthopedic aftercare: Secondary | ICD-10-CM | POA: Diagnosis not present

## 2015-12-21 DIAGNOSIS — R260 Ataxic gait: Secondary | ICD-10-CM | POA: Diagnosis not present

## 2015-12-22 DIAGNOSIS — Z4789 Encounter for other orthopedic aftercare: Secondary | ICD-10-CM | POA: Diagnosis not present

## 2015-12-22 DIAGNOSIS — K219 Gastro-esophageal reflux disease without esophagitis: Secondary | ICD-10-CM | POA: Diagnosis not present

## 2015-12-22 DIAGNOSIS — R531 Weakness: Secondary | ICD-10-CM | POA: Diagnosis not present

## 2015-12-22 DIAGNOSIS — R260 Ataxic gait: Secondary | ICD-10-CM | POA: Diagnosis not present

## 2015-12-24 DIAGNOSIS — K219 Gastro-esophageal reflux disease without esophagitis: Secondary | ICD-10-CM | POA: Diagnosis not present

## 2015-12-24 DIAGNOSIS — Z4789 Encounter for other orthopedic aftercare: Secondary | ICD-10-CM | POA: Diagnosis not present

## 2015-12-24 DIAGNOSIS — R260 Ataxic gait: Secondary | ICD-10-CM | POA: Diagnosis not present

## 2015-12-24 DIAGNOSIS — R531 Weakness: Secondary | ICD-10-CM | POA: Diagnosis not present

## 2015-12-29 DIAGNOSIS — R531 Weakness: Secondary | ICD-10-CM | POA: Diagnosis not present

## 2015-12-29 DIAGNOSIS — Z4789 Encounter for other orthopedic aftercare: Secondary | ICD-10-CM | POA: Diagnosis not present

## 2015-12-29 DIAGNOSIS — K219 Gastro-esophageal reflux disease without esophagitis: Secondary | ICD-10-CM | POA: Diagnosis not present

## 2015-12-29 DIAGNOSIS — R260 Ataxic gait: Secondary | ICD-10-CM | POA: Diagnosis not present

## 2015-12-31 DIAGNOSIS — Z4789 Encounter for other orthopedic aftercare: Secondary | ICD-10-CM | POA: Diagnosis not present

## 2015-12-31 DIAGNOSIS — R260 Ataxic gait: Secondary | ICD-10-CM | POA: Diagnosis not present

## 2015-12-31 DIAGNOSIS — R531 Weakness: Secondary | ICD-10-CM | POA: Diagnosis not present

## 2015-12-31 DIAGNOSIS — K219 Gastro-esophageal reflux disease without esophagitis: Secondary | ICD-10-CM | POA: Diagnosis not present

## 2016-01-05 DIAGNOSIS — Z4789 Encounter for other orthopedic aftercare: Secondary | ICD-10-CM | POA: Diagnosis not present

## 2016-01-05 DIAGNOSIS — K219 Gastro-esophageal reflux disease without esophagitis: Secondary | ICD-10-CM | POA: Diagnosis not present

## 2016-01-05 DIAGNOSIS — R260 Ataxic gait: Secondary | ICD-10-CM | POA: Diagnosis not present

## 2016-01-05 DIAGNOSIS — R531 Weakness: Secondary | ICD-10-CM | POA: Diagnosis not present

## 2016-01-07 DIAGNOSIS — K219 Gastro-esophageal reflux disease without esophagitis: Secondary | ICD-10-CM | POA: Diagnosis not present

## 2016-01-07 DIAGNOSIS — Z4789 Encounter for other orthopedic aftercare: Secondary | ICD-10-CM | POA: Diagnosis not present

## 2016-01-07 DIAGNOSIS — R260 Ataxic gait: Secondary | ICD-10-CM | POA: Diagnosis not present

## 2016-01-07 DIAGNOSIS — R531 Weakness: Secondary | ICD-10-CM | POA: Diagnosis not present

## 2016-01-12 DIAGNOSIS — K219 Gastro-esophageal reflux disease without esophagitis: Secondary | ICD-10-CM | POA: Diagnosis not present

## 2016-01-12 DIAGNOSIS — R260 Ataxic gait: Secondary | ICD-10-CM | POA: Diagnosis not present

## 2016-01-12 DIAGNOSIS — Z4789 Encounter for other orthopedic aftercare: Secondary | ICD-10-CM | POA: Diagnosis not present

## 2016-01-12 DIAGNOSIS — R531 Weakness: Secondary | ICD-10-CM | POA: Diagnosis not present

## 2016-01-13 DIAGNOSIS — N200 Calculus of kidney: Secondary | ICD-10-CM | POA: Diagnosis not present

## 2016-01-13 DIAGNOSIS — R319 Hematuria, unspecified: Secondary | ICD-10-CM | POA: Diagnosis not present

## 2016-01-14 DIAGNOSIS — K219 Gastro-esophageal reflux disease without esophagitis: Secondary | ICD-10-CM | POA: Diagnosis not present

## 2016-01-14 DIAGNOSIS — R531 Weakness: Secondary | ICD-10-CM | POA: Diagnosis not present

## 2016-01-14 DIAGNOSIS — Z4789 Encounter for other orthopedic aftercare: Secondary | ICD-10-CM | POA: Diagnosis not present

## 2016-01-14 DIAGNOSIS — R260 Ataxic gait: Secondary | ICD-10-CM | POA: Diagnosis not present

## 2016-01-19 DIAGNOSIS — R2689 Other abnormalities of gait and mobility: Secondary | ICD-10-CM | POA: Diagnosis not present

## 2016-01-19 DIAGNOSIS — M5417 Radiculopathy, lumbosacral region: Secondary | ICD-10-CM | POA: Diagnosis not present

## 2016-01-21 DIAGNOSIS — M5417 Radiculopathy, lumbosacral region: Secondary | ICD-10-CM | POA: Diagnosis not present

## 2016-01-21 DIAGNOSIS — R2689 Other abnormalities of gait and mobility: Secondary | ICD-10-CM | POA: Diagnosis not present

## 2016-01-26 DIAGNOSIS — M5417 Radiculopathy, lumbosacral region: Secondary | ICD-10-CM | POA: Diagnosis not present

## 2016-01-26 DIAGNOSIS — R2689 Other abnormalities of gait and mobility: Secondary | ICD-10-CM | POA: Diagnosis not present

## 2016-01-28 DIAGNOSIS — R2689 Other abnormalities of gait and mobility: Secondary | ICD-10-CM | POA: Diagnosis not present

## 2016-01-28 DIAGNOSIS — M5417 Radiculopathy, lumbosacral region: Secondary | ICD-10-CM | POA: Diagnosis not present

## 2016-02-02 DIAGNOSIS — M5417 Radiculopathy, lumbosacral region: Secondary | ICD-10-CM | POA: Diagnosis not present

## 2016-02-02 DIAGNOSIS — R2689 Other abnormalities of gait and mobility: Secondary | ICD-10-CM | POA: Diagnosis not present

## 2016-02-04 DIAGNOSIS — M5417 Radiculopathy, lumbosacral region: Secondary | ICD-10-CM | POA: Diagnosis not present

## 2016-02-04 DIAGNOSIS — R2689 Other abnormalities of gait and mobility: Secondary | ICD-10-CM | POA: Diagnosis not present

## 2016-02-09 DIAGNOSIS — M5417 Radiculopathy, lumbosacral region: Secondary | ICD-10-CM | POA: Diagnosis not present

## 2016-02-09 DIAGNOSIS — R2689 Other abnormalities of gait and mobility: Secondary | ICD-10-CM | POA: Diagnosis not present

## 2016-02-11 DIAGNOSIS — M5417 Radiculopathy, lumbosacral region: Secondary | ICD-10-CM | POA: Diagnosis not present

## 2016-02-11 DIAGNOSIS — R2689 Other abnormalities of gait and mobility: Secondary | ICD-10-CM | POA: Diagnosis not present

## 2016-02-16 DIAGNOSIS — M5417 Radiculopathy, lumbosacral region: Secondary | ICD-10-CM | POA: Diagnosis not present

## 2016-02-16 DIAGNOSIS — R2689 Other abnormalities of gait and mobility: Secondary | ICD-10-CM | POA: Diagnosis not present

## 2016-02-18 DIAGNOSIS — R2689 Other abnormalities of gait and mobility: Secondary | ICD-10-CM | POA: Diagnosis not present

## 2016-02-18 DIAGNOSIS — M5417 Radiculopathy, lumbosacral region: Secondary | ICD-10-CM | POA: Diagnosis not present

## 2016-02-23 DIAGNOSIS — M5417 Radiculopathy, lumbosacral region: Secondary | ICD-10-CM | POA: Diagnosis not present

## 2016-02-23 DIAGNOSIS — R2689 Other abnormalities of gait and mobility: Secondary | ICD-10-CM | POA: Diagnosis not present

## 2016-02-25 DIAGNOSIS — R2689 Other abnormalities of gait and mobility: Secondary | ICD-10-CM | POA: Diagnosis not present

## 2016-02-25 DIAGNOSIS — M5417 Radiculopathy, lumbosacral region: Secondary | ICD-10-CM | POA: Diagnosis not present

## 2016-03-01 DIAGNOSIS — R2689 Other abnormalities of gait and mobility: Secondary | ICD-10-CM | POA: Diagnosis not present

## 2016-03-01 DIAGNOSIS — M5417 Radiculopathy, lumbosacral region: Secondary | ICD-10-CM | POA: Diagnosis not present

## 2016-03-03 DIAGNOSIS — R2689 Other abnormalities of gait and mobility: Secondary | ICD-10-CM | POA: Diagnosis not present

## 2016-03-03 DIAGNOSIS — M5417 Radiculopathy, lumbosacral region: Secondary | ICD-10-CM | POA: Diagnosis not present

## 2016-03-08 DIAGNOSIS — R2689 Other abnormalities of gait and mobility: Secondary | ICD-10-CM | POA: Diagnosis not present

## 2016-03-08 DIAGNOSIS — M5417 Radiculopathy, lumbosacral region: Secondary | ICD-10-CM | POA: Diagnosis not present

## 2016-03-10 DIAGNOSIS — R2689 Other abnormalities of gait and mobility: Secondary | ICD-10-CM | POA: Diagnosis not present

## 2016-03-10 DIAGNOSIS — M5417 Radiculopathy, lumbosacral region: Secondary | ICD-10-CM | POA: Diagnosis not present

## 2016-03-14 DIAGNOSIS — M419 Scoliosis, unspecified: Secondary | ICD-10-CM | POA: Diagnosis not present

## 2016-03-14 DIAGNOSIS — G959 Disease of spinal cord, unspecified: Secondary | ICD-10-CM | POA: Diagnosis not present

## 2016-03-14 DIAGNOSIS — M5416 Radiculopathy, lumbar region: Secondary | ICD-10-CM | POA: Diagnosis not present

## 2016-03-14 DIAGNOSIS — M4120 Other idiopathic scoliosis, site unspecified: Secondary | ICD-10-CM | POA: Diagnosis not present

## 2016-03-14 DIAGNOSIS — M4317 Spondylolisthesis, lumbosacral region: Secondary | ICD-10-CM | POA: Diagnosis not present

## 2016-03-14 DIAGNOSIS — R030 Elevated blood-pressure reading, without diagnosis of hypertension: Secondary | ICD-10-CM | POA: Diagnosis not present

## 2016-03-16 ENCOUNTER — Other Ambulatory Visit: Payer: Self-pay | Admitting: Neurosurgery

## 2016-03-16 ENCOUNTER — Encounter (HOSPITAL_COMMUNITY): Payer: Self-pay | Admitting: *Deleted

## 2016-03-16 DIAGNOSIS — G959 Disease of spinal cord, unspecified: Secondary | ICD-10-CM | POA: Diagnosis not present

## 2016-03-16 DIAGNOSIS — M5000 Cervical disc disorder with myelopathy, unspecified cervical region: Secondary | ICD-10-CM | POA: Diagnosis not present

## 2016-03-16 DIAGNOSIS — M4802 Spinal stenosis, cervical region: Secondary | ICD-10-CM | POA: Diagnosis not present

## 2016-03-16 DIAGNOSIS — R030 Elevated blood-pressure reading, without diagnosis of hypertension: Secondary | ICD-10-CM | POA: Diagnosis not present

## 2016-03-16 NOTE — Progress Notes (Signed)
Pt denies cardiac history, chest pain or sob. Instructed pt to stop all herbal supplements and vitamins as of today.

## 2016-03-18 ENCOUNTER — Inpatient Hospital Stay (HOSPITAL_COMMUNITY): Payer: Medicare Other

## 2016-03-18 ENCOUNTER — Inpatient Hospital Stay (HOSPITAL_COMMUNITY): Payer: Medicare Other | Admitting: Anesthesiology

## 2016-03-18 ENCOUNTER — Inpatient Hospital Stay (HOSPITAL_COMMUNITY)
Admission: RE | Admit: 2016-03-18 | Discharge: 2016-03-21 | DRG: 473 | Disposition: A | Discharge status: Skilled Nursing Facility | Payer: Medicare Other | Source: Ambulatory Visit | Attending: Neurosurgery | Admitting: Neurosurgery

## 2016-03-18 ENCOUNTER — Encounter (HOSPITAL_COMMUNITY)
Admission: RE | Disposition: A | Discharge status: Skilled Nursing Facility | Payer: Self-pay | Source: Ambulatory Visit | Attending: Neurosurgery

## 2016-03-18 ENCOUNTER — Encounter (HOSPITAL_COMMUNITY): Payer: Self-pay | Admitting: Surgery

## 2016-03-18 DIAGNOSIS — M549 Dorsalgia, unspecified: Secondary | ICD-10-CM

## 2016-03-18 DIAGNOSIS — M40202 Unspecified kyphosis, cervical region: Secondary | ICD-10-CM | POA: Diagnosis present

## 2016-03-18 DIAGNOSIS — M6281 Muscle weakness (generalized): Secondary | ICD-10-CM | POA: Diagnosis not present

## 2016-03-18 DIAGNOSIS — M50021 Cervical disc disorder at C4-C5 level with myelopathy: Secondary | ICD-10-CM | POA: Diagnosis not present

## 2016-03-18 DIAGNOSIS — R2681 Unsteadiness on feet: Secondary | ICD-10-CM | POA: Diagnosis not present

## 2016-03-18 DIAGNOSIS — M545 Low back pain: Secondary | ICD-10-CM | POA: Diagnosis not present

## 2016-03-18 DIAGNOSIS — M4802 Spinal stenosis, cervical region: Secondary | ICD-10-CM | POA: Diagnosis not present

## 2016-03-18 DIAGNOSIS — M4712 Other spondylosis with myelopathy, cervical region: Secondary | ICD-10-CM | POA: Diagnosis not present

## 2016-03-18 DIAGNOSIS — Z87891 Personal history of nicotine dependence: Secondary | ICD-10-CM

## 2016-03-18 DIAGNOSIS — M5001 Cervical disc disorder with myelopathy,  high cervical region: Secondary | ICD-10-CM | POA: Diagnosis present

## 2016-03-18 DIAGNOSIS — M5003 Cervical disc disorder with myelopathy, cervicothoracic region: Secondary | ICD-10-CM | POA: Diagnosis present

## 2016-03-18 DIAGNOSIS — M50921 Unspecified cervical disc disorder at C4-C5 level: Secondary | ICD-10-CM | POA: Diagnosis not present

## 2016-03-18 DIAGNOSIS — M50923 Unspecified cervical disc disorder at C6-C7 level: Secondary | ICD-10-CM | POA: Diagnosis not present

## 2016-03-18 DIAGNOSIS — M5000 Cervical disc disorder with myelopathy, unspecified cervical region: Secondary | ICD-10-CM | POA: Diagnosis not present

## 2016-03-18 DIAGNOSIS — G9520 Unspecified cord compression: Secondary | ICD-10-CM | POA: Diagnosis not present

## 2016-03-18 DIAGNOSIS — Z5189 Encounter for other specified aftercare: Secondary | ICD-10-CM | POA: Diagnosis not present

## 2016-03-18 DIAGNOSIS — R278 Other lack of coordination: Secondary | ICD-10-CM | POA: Diagnosis not present

## 2016-03-18 DIAGNOSIS — M50922 Unspecified cervical disc disorder at C5-C6 level: Secondary | ICD-10-CM | POA: Diagnosis not present

## 2016-03-18 DIAGNOSIS — M1990 Unspecified osteoarthritis, unspecified site: Secondary | ICD-10-CM | POA: Diagnosis not present

## 2016-03-18 DIAGNOSIS — R279 Unspecified lack of coordination: Secondary | ICD-10-CM | POA: Diagnosis not present

## 2016-03-18 DIAGNOSIS — G959 Disease of spinal cord, unspecified: Secondary | ICD-10-CM | POA: Diagnosis present

## 2016-03-18 HISTORY — PX: ANTERIOR CERVICAL DECOMP/DISCECTOMY FUSION: SHX1161

## 2016-03-18 LAB — BASIC METABOLIC PANEL
ANION GAP: 15 (ref 5–15)
BUN: 11 mg/dL (ref 6–20)
CO2: 26 mmol/L (ref 22–32)
Calcium: 9.6 mg/dL (ref 8.9–10.3)
Chloride: 99 mmol/L — ABNORMAL LOW (ref 101–111)
Creatinine, Ser: 0.44 mg/dL — ABNORMAL LOW (ref 0.61–1.24)
GFR calc Af Amer: >60 mL/min (ref >60)
GFR calc non Af Amer: >60 mL/min (ref >60)
GLUCOSE: 128 mg/dL — AB (ref 65–99)
POTASSIUM: 4.5 mmol/L (ref 3.5–5.1)
Sodium: 140 mmol/L (ref 135–145)

## 2016-03-18 LAB — CBC
HEMATOCRIT: 46.2 % (ref 39.0–52.0)
HEMOGLOBIN: 15.4 g/dL (ref 13.0–17.0)
MCH: 30.1 pg (ref 26.0–34.0)
MCHC: 33.3 g/dL (ref 30.0–36.0)
MCV: 90.4 fL (ref 78.0–100.0)
Platelets: 212 10*3/uL (ref 150–400)
RBC: 5.11 MIL/uL (ref 4.22–5.81)
RDW: 13.5 % (ref 11.5–15.5)
WBC: 11.4 10*3/uL — ABNORMAL HIGH (ref 4.0–10.5)

## 2016-03-18 LAB — SURGICAL PCR SCREEN
MRSA, PCR: NEGATIVE
Staphylococcus aureus: NEGATIVE

## 2016-03-18 SURGERY — ANTERIOR CERVICAL DECOMPRESSION/DISCECTOMY FUSION 3 LEVELS
Anesthesia: General

## 2016-03-18 MED ORDER — THROMBIN 5000 UNITS EX SOLR
OROMUCOSAL | Status: DC | PRN
  Administered 2016-03-18: 10 mL via TOPICAL

## 2016-03-18 MED ORDER — PAPAYA AND ENZYMES PO CHEW: CHEWABLE_TABLET | Freq: Two times a day (BID) | ORAL | Status: DC | PRN

## 2016-03-18 MED ORDER — MUPIROCIN 2 % EX OINT
TOPICAL_OINTMENT | CUTANEOUS | Status: AC
  Administered 2016-03-18: 1 via TOPICAL
  Filled 2016-03-18: qty 22

## 2016-03-18 MED ORDER — METHOCARBAMOL 1000 MG/10ML IJ SOLN
500.0000 mg | Freq: Four times a day (QID) | INTRAVENOUS | Status: DC | PRN
  Filled 2016-03-18: qty 5

## 2016-03-18 MED ORDER — DEXAMETHASONE SODIUM PHOSPHATE 10 MG/ML IJ SOLN
INTRAMUSCULAR | Status: DC | PRN
  Administered 2016-03-18: 10 mg via INTRAVENOUS

## 2016-03-18 MED ORDER — ONDANSETRON HCL 4 MG/2ML IJ SOLN
4.0000 mg | Freq: Once | INTRAMUSCULAR | Status: DC | PRN
Start: 2016-03-18 — End: 2016-03-18

## 2016-03-18 MED ORDER — VITAMIN D 1000 UNITS PO TABS
5000.0000 [IU] | ORAL_TABLET | Freq: Every day | ORAL | Status: DC
  Administered 2016-03-19 – 2016-03-21 (×3): 5000 [IU] via ORAL
  Filled 2016-03-18 (×3): qty 5

## 2016-03-18 MED ORDER — CEFAZOLIN SODIUM-DEXTROSE 2-4 GM/100ML-% IV SOLN
2.0000 g | INTRAVENOUS | Status: AC
  Administered 2016-03-18: 2 g via INTRAVENOUS

## 2016-03-18 MED ORDER — ONDANSETRON HCL 4 MG/2ML IJ SOLN
INTRAMUSCULAR | Status: AC
  Filled 2016-03-18: qty 2

## 2016-03-18 MED ORDER — ADULT MULTIVITAMIN W/MINERALS CH
1.0000 | ORAL_TABLET | Freq: Every day | ORAL | Status: DC
  Administered 2016-03-19 – 2016-03-21 (×3): 1 via ORAL
  Filled 2016-03-18 (×4): qty 1

## 2016-03-18 MED ORDER — OXYCODONE-ACETAMINOPHEN 5-325 MG PO TABS
1.0000 | ORAL_TABLET | ORAL | Status: DC | PRN
  Administered 2016-03-18: 2 via ORAL
  Filled 2016-03-18: qty 1

## 2016-03-18 MED ORDER — HYDROMORPHONE HCL 1 MG/ML IJ SOLN: 0.5000 mg | INTRAMUSCULAR | Status: DC | PRN

## 2016-03-18 MED ORDER — ARTIFICIAL TEARS OP OINT
TOPICAL_OINTMENT | OPHTHALMIC | Status: AC
  Filled 2016-03-18: qty 3.5

## 2016-03-18 MED ORDER — LORATADINE 10 MG PO TABS: 10.0000 mg | ORAL_TABLET | Freq: Every day | ORAL | Status: DC | PRN

## 2016-03-18 MED ORDER — PANTOPRAZOLE SODIUM 40 MG IV SOLR: 40.0000 mg | Freq: Every day | INTRAVENOUS | Status: DC

## 2016-03-18 MED ORDER — METHOCARBAMOL 500 MG PO TABS
500.0000 mg | ORAL_TABLET | Freq: Four times a day (QID) | ORAL | Status: DC | PRN
  Administered 2016-03-19 – 2016-03-21 (×2): 500 mg via ORAL
  Filled 2016-03-18 (×2): qty 1

## 2016-03-18 MED ORDER — ZOLPIDEM TARTRATE 5 MG PO TABS: 5.0000 mg | ORAL_TABLET | Freq: Every evening | ORAL | Status: DC | PRN

## 2016-03-18 MED ORDER — SODIUM CHLORIDE 0.9 % IV SOLN: 250.0000 mL | INTRAVENOUS | Status: DC

## 2016-03-18 MED ORDER — 0.9 % SODIUM CHLORIDE (POUR BTL) OPTIME
TOPICAL | Status: DC | PRN
  Administered 2016-03-18: 1000 mL

## 2016-03-18 MED ORDER — ACETAMINOPHEN 650 MG RE SUPP: 650.0000 mg | RECTAL | Status: DC | PRN

## 2016-03-18 MED ORDER — SODIUM CHLORIDE 0.9% FLUSH
3.0000 mL | Freq: Two times a day (BID) | INTRAVENOUS | Status: DC
  Administered 2016-03-20: 3 mL via INTRAVENOUS

## 2016-03-18 MED ORDER — ROCURONIUM BROMIDE 50 MG/5ML IV SOLN
INTRAVENOUS | Status: AC
  Filled 2016-03-18: qty 1

## 2016-03-18 MED ORDER — FENTANYL CITRATE (PF) 250 MCG/5ML IJ SOLN
INTRAMUSCULAR | Status: AC
  Filled 2016-03-18: qty 5

## 2016-03-18 MED ORDER — KCL IN DEXTROSE-NACL 20-5-0.45 MEQ/L-%-% IV SOLN
INTRAVENOUS | Status: DC
  Administered 2016-03-18: 22:00:00 via INTRAVENOUS
  Filled 2016-03-18: qty 1000

## 2016-03-18 MED ORDER — PHENYLEPHRINE HCL 10 MG/ML IJ SOLN
10.0000 mg | INTRAVENOUS | Status: DC | PRN
  Administered 2016-03-18: 25 ug/min via INTRAVENOUS

## 2016-03-18 MED ORDER — EPHEDRINE 5 MG/ML INJ
INTRAVENOUS | Status: AC
  Filled 2016-03-18: qty 10

## 2016-03-18 MED ORDER — MUPIROCIN 2 % EX OINT
1.0000 "application " | TOPICAL_OINTMENT | Freq: Once | CUTANEOUS | Status: AC
  Administered 2016-03-18: 1 via TOPICAL

## 2016-03-18 MED ORDER — THROMBIN 20000 UNITS EX SOLR
CUTANEOUS | Status: DC | PRN
  Administered 2016-03-18: 20 mL via TOPICAL

## 2016-03-18 MED ORDER — MIDAZOLAM HCL 2 MG/2ML IJ SOLN
INTRAMUSCULAR | Status: AC
  Filled 2016-03-18: qty 2

## 2016-03-18 MED ORDER — PROPOFOL 10 MG/ML IV BOLUS
INTRAVENOUS | Status: DC | PRN
  Administered 2016-03-18: 200 mg via INTRAVENOUS

## 2016-03-18 MED ORDER — LIDOCAINE HCL (CARDIAC) 20 MG/ML IV SOLN
INTRAVENOUS | Status: DC | PRN
  Administered 2016-03-18: 40 mg via INTRAVENOUS

## 2016-03-18 MED ORDER — ONDANSETRON HCL 4 MG/2ML IJ SOLN: 4.0000 mg | INTRAMUSCULAR | Status: DC | PRN

## 2016-03-18 MED ORDER — SUCCINYLCHOLINE CHLORIDE 200 MG/10ML IV SOSY
PREFILLED_SYRINGE | INTRAVENOUS | Status: AC
  Filled 2016-03-18: qty 10

## 2016-03-18 MED ORDER — PROPOFOL 10 MG/ML IV BOLUS
INTRAVENOUS | Status: AC
  Filled 2016-03-18: qty 20

## 2016-03-18 MED ORDER — PHENOL 1.4 % MT LIQD: 1.0000 | OROMUCOSAL | Status: DC | PRN

## 2016-03-18 MED ORDER — FENTANYL CITRATE (PF) 100 MCG/2ML IJ SOLN: 25.0000 ug | INTRAMUSCULAR | Status: DC | PRN

## 2016-03-18 MED ORDER — HYDROCODONE-ACETAMINOPHEN 5-325 MG PO TABS: 1.0000 | ORAL_TABLET | ORAL | Status: DC | PRN

## 2016-03-18 MED ORDER — LACTATED RINGERS IV SOLN
INTRAVENOUS | Status: DC
  Administered 2016-03-18 (×2): via INTRAVENOUS

## 2016-03-18 MED ORDER — RISAQUAD PO CAPS
1.0000 | ORAL_CAPSULE | Freq: Three times a day (TID) | ORAL | Status: DC
Start: 2016-03-19 — End: 2016-03-21
  Administered 2016-03-19 – 2016-03-21 (×4): 1 via ORAL
  Filled 2016-03-18 (×5): qty 1

## 2016-03-18 MED ORDER — BISACODYL 10 MG RE SUPP: 10.0000 mg | Freq: Every day | RECTAL | Status: DC | PRN

## 2016-03-18 MED ORDER — ALUM & MAG HYDROXIDE-SIMETH 200-200-20 MG/5ML PO SUSP: 30.0000 mL | Freq: Four times a day (QID) | ORAL | Status: DC | PRN

## 2016-03-18 MED ORDER — ONDANSETRON HCL 4 MG/2ML IJ SOLN
INTRAMUSCULAR | Status: DC | PRN
  Administered 2016-03-18: 4 mg via INTRAVENOUS

## 2016-03-18 MED ORDER — LACTATED RINGERS IV SOLN
INTRAVENOUS | Status: DC | PRN
  Administered 2016-03-18: 11:00:00 via INTRAVENOUS

## 2016-03-18 MED ORDER — LIDOCAINE 2% (20 MG/ML) 5 ML SYRINGE
INTRAMUSCULAR | Status: AC
  Filled 2016-03-18: qty 5

## 2016-03-18 MED ORDER — VECURONIUM BROMIDE 10 MG IV SOLR
INTRAVENOUS | Status: DC | PRN
  Administered 2016-03-18: 8 mg via INTRAVENOUS

## 2016-03-18 MED ORDER — FENTANYL CITRATE (PF) 100 MCG/2ML IJ SOLN
INTRAMUSCULAR | Status: DC | PRN
  Administered 2016-03-18 (×4): 50 ug via INTRAVENOUS

## 2016-03-18 MED ORDER — POLYETHYLENE GLYCOL 3350 17 G PO PACK: 17.0000 g | PACK | Freq: Every day | ORAL | Status: DC | PRN

## 2016-03-18 MED ORDER — SODIUM CHLORIDE 0.9% FLUSH
3.0000 mL | INTRAVENOUS | Status: DC | PRN
  Administered 2016-03-19: 3 mL via INTRAVENOUS
  Filled 2016-03-18: qty 3

## 2016-03-18 MED ORDER — OXYCODONE-ACETAMINOPHEN 5-325 MG PO TABS
ORAL_TABLET | ORAL | Status: AC
  Filled 2016-03-18: qty 2

## 2016-03-18 MED ORDER — EPHEDRINE SULFATE 50 MG/ML IJ SOLN
INTRAMUSCULAR | Status: DC | PRN
  Administered 2016-03-18: 10 mg via INTRAVENOUS
  Administered 2016-03-18 (×2): 5 mg via INTRAVENOUS

## 2016-03-18 MED ORDER — FLEET ENEMA 7-19 GM/118ML RE ENEM: 1.0000 | ENEMA | Freq: Once | RECTAL | Status: DC | PRN

## 2016-03-18 MED ORDER — MIDAZOLAM HCL 5 MG/5ML IJ SOLN
INTRAMUSCULAR | Status: DC | PRN
Start: 2016-03-18 — End: 2016-03-18
  Administered 2016-03-18 (×2): 1 mg via INTRAVENOUS

## 2016-03-18 MED ORDER — CEFAZOLIN SODIUM-DEXTROSE 2-4 GM/100ML-% IV SOLN
2.0000 g | Freq: Three times a day (TID) | INTRAVENOUS | Status: AC
  Administered 2016-03-18 – 2016-03-19 (×2): 2 g via INTRAVENOUS
  Filled 2016-03-18 (×2): qty 100

## 2016-03-18 MED ORDER — PANTOPRAZOLE SODIUM 40 MG PO TBEC
40.0000 mg | DELAYED_RELEASE_TABLET | Freq: Every day | ORAL | Status: DC
  Administered 2016-03-18 – 2016-03-20 (×3): 40 mg via ORAL
  Filled 2016-03-18 (×3): qty 1

## 2016-03-18 MED ORDER — PROPOFOL 10 MG/ML IV BOLUS
INTRAVENOUS | Status: AC
  Filled 2016-03-18: qty 40

## 2016-03-18 MED ORDER — MENTHOL 3 MG MT LOZG: 1.0000 | LOZENGE | OROMUCOSAL | Status: DC | PRN

## 2016-03-18 MED ORDER — DOCUSATE SODIUM 100 MG PO CAPS
100.0000 mg | ORAL_CAPSULE | Freq: Two times a day (BID) | ORAL | Status: DC
  Administered 2016-03-18 – 2016-03-21 (×6): 100 mg via ORAL
  Filled 2016-03-18 (×6): qty 1

## 2016-03-18 MED ORDER — ACETAMINOPHEN 325 MG PO TABS: 650.0000 mg | ORAL_TABLET | ORAL | Status: DC | PRN

## 2016-03-18 MED ORDER — TURMERIC CURCUMIN 500 MG PO CAPS: 1.0000 | ORAL_CAPSULE | Freq: Every day | ORAL | Status: DC

## 2016-03-18 MED ORDER — CEFAZOLIN SODIUM-DEXTROSE 2-4 GM/100ML-% IV SOLN
INTRAVENOUS | Status: AC
  Filled 2016-03-18: qty 100

## 2016-03-18 MED ORDER — SUGAMMADEX SODIUM 200 MG/2ML IV SOLN
INTRAVENOUS | Status: DC | PRN
  Administered 2016-03-18: 200 mg via INTRAVENOUS

## 2016-03-18 SURGICAL SUPPLY — 76 items
ADH SKN CLS APL DERMABOND .7 (GAUZE/BANDAGES/DRESSINGS) ×1
APL SKNCLS STERI-STRIP NONHPOA (GAUZE/BANDAGES/DRESSINGS)
BENZOIN TINCTURE PRP APPL 2/3 (GAUZE/BANDAGES/DRESSINGS) IMPLANT
BIT DRILL NEURO 2X3.1 SFT TUCH (MISCELLANEOUS) ×1 IMPLANT
BIT DRILL POWER (BIT) IMPLANT
BLADE ULTRA TIP 2M (BLADE) IMPLANT
BNDG GAUZE ELAST 4 BULKY (GAUZE/BANDAGES/DRESSINGS) IMPLANT
BUR BARREL STRAIGHT FLUTE 4.0 (BURR) ×3 IMPLANT
CAGE COROENT SM 6X13X15 (Cage) ×2 IMPLANT
CAGE LORDOTIC 8 SM PLUS (Cage) ×2 IMPLANT
CAGE SMALL 7X13X15 (Cage) ×2 IMPLANT
CANISTER SUCT 3000ML PPV (MISCELLANEOUS) ×3 IMPLANT
CLOSURE WOUND 1/2 X4 (GAUZE/BANDAGES/DRESSINGS) ×1
COVER MAYO STAND STRL (DRAPES) ×3 IMPLANT
DECANTER SPIKE VIAL GLASS SM (MISCELLANEOUS) ×3 IMPLANT
DERMABOND ADVANCED (GAUZE/BANDAGES/DRESSINGS) ×2
DERMABOND ADVANCED .7 DNX12 (GAUZE/BANDAGES/DRESSINGS) ×1 IMPLANT
DRAPE LAPAROTOMY 100X72 PEDS (DRAPES) ×3 IMPLANT
DRAPE MICROSCOPE LEICA (MISCELLANEOUS) ×3 IMPLANT
DRAPE POUCH INSTRU U-SHP 10X18 (DRAPES) ×3 IMPLANT
DRAPE PROXIMA HALF (DRAPES) IMPLANT
DRILL BIT POWER (BIT) ×3
DRILL NEURO 2X3.1 SOFT TOUCH (MISCELLANEOUS) ×3
DRSG OPSITE POSTOP 3X4 (GAUZE/BANDAGES/DRESSINGS) ×2 IMPLANT
DURAPREP 6ML APPLICATOR 50/CS (WOUND CARE) ×3 IMPLANT
ELECT COATED BLADE 2.86 ST (ELECTRODE) ×3 IMPLANT
ELECT REM PT RETURN 9FT ADLT (ELECTROSURGICAL) ×3
ELECTRODE REM PT RTRN 9FT ADLT (ELECTROSURGICAL) ×1 IMPLANT
GAUZE SPONGE 4X4 12PLY STRL (GAUZE/BANDAGES/DRESSINGS) IMPLANT
GAUZE SPONGE 4X4 16PLY XRAY LF (GAUZE/BANDAGES/DRESSINGS) IMPLANT
GLOVE BIO SURGEON STRL SZ8 (GLOVE) ×3 IMPLANT
GLOVE BIOGEL PI IND STRL 8 (GLOVE) ×1 IMPLANT
GLOVE BIOGEL PI IND STRL 8.5 (GLOVE) ×1 IMPLANT
GLOVE BIOGEL PI INDICATOR 8 (GLOVE) ×2
GLOVE BIOGEL PI INDICATOR 8.5 (GLOVE) ×2
GLOVE ECLIPSE 7.0 STRL STRAW (GLOVE) ×2 IMPLANT
GLOVE ECLIPSE 7.5 STRL STRAW (GLOVE) ×2 IMPLANT
GLOVE ECLIPSE 8.0 STRL XLNG CF (GLOVE) ×3 IMPLANT
GLOVE EXAM NITRILE LRG STRL (GLOVE) IMPLANT
GLOVE EXAM NITRILE MD LF STRL (GLOVE) IMPLANT
GLOVE EXAM NITRILE XL STR (GLOVE) IMPLANT
GLOVE EXAM NITRILE XS STR PU (GLOVE) IMPLANT
GLOVE INDICATOR 7.5 STRL GRN (GLOVE) ×6 IMPLANT
GOWN STRL REUS W/ TWL LRG LVL3 (GOWN DISPOSABLE) IMPLANT
GOWN STRL REUS W/ TWL XL LVL3 (GOWN DISPOSABLE) ×1 IMPLANT
GOWN STRL REUS W/TWL 2XL LVL3 (GOWN DISPOSABLE) ×3 IMPLANT
GOWN STRL REUS W/TWL LRG LVL3 (GOWN DISPOSABLE)
GOWN STRL REUS W/TWL XL LVL3 (GOWN DISPOSABLE) ×3
HALTER HD/CHIN CERV TRACTION D (MISCELLANEOUS) ×3 IMPLANT
KIT BASIN OR (CUSTOM PROCEDURE TRAY) ×3 IMPLANT
KIT ROOM TURNOVER OR (KITS) ×3 IMPLANT
NDL HYPO 25X1 1.5 SAFETY (NEEDLE) ×1 IMPLANT
NDL SPNL 18GX3.5 QUINCKE PK (NEEDLE) IMPLANT
NDL SPNL 22GX3.5 QUINCKE BK (NEEDLE) ×1 IMPLANT
NEEDLE HYPO 25X1 1.5 SAFETY (NEEDLE) ×3 IMPLANT
NEEDLE SPNL 18GX3.5 QUINCKE PK (NEEDLE) IMPLANT
NEEDLE SPNL 22GX3.5 QUINCKE BK (NEEDLE) ×3 IMPLANT
NS IRRIG 1000ML POUR BTL (IV SOLUTION) ×3 IMPLANT
PACK LAMINECTOMY NEURO (CUSTOM PROCEDURE TRAY) ×3 IMPLANT
PAD ARMBOARD 7.5X6 YLW CONV (MISCELLANEOUS) ×9 IMPLANT
PIN DISTRACTION 14MM (PIN) ×8 IMPLANT
PLATE ARCHON 3-LEVEL 54MM (Plate) ×2 IMPLANT
PUTTY DBX 5CC (Putty) ×2 IMPLANT
RUBBERBAND STERILE (MISCELLANEOUS) ×6 IMPLANT
SCREW ARCHON SELFTAP 4.0X13 (Screw) ×16 IMPLANT
SPONGE INTESTINAL PEANUT (DISPOSABLE) ×3 IMPLANT
SPONGE SURGIFOAM ABS GEL 100 (HEMOSTASIS) ×3 IMPLANT
STAPLER SKIN PROX WIDE 3.9 (STAPLE) IMPLANT
STRIP CLOSURE SKIN 1/2X4 (GAUZE/BANDAGES/DRESSINGS) ×1 IMPLANT
SUT VIC AB 3-0 SH 8-18 (SUTURE) ×3 IMPLANT
SYR 5ML LL (SYRINGE) IMPLANT
TOWEL OR 17X24 6PK STRL BLUE (TOWEL DISPOSABLE) ×3 IMPLANT
TOWEL OR 17X26 10 PK STRL BLUE (TOWEL DISPOSABLE) ×3 IMPLANT
TRAP SPECIMEN MUCOUS 40CC (MISCELLANEOUS) IMPLANT
TRAY FOLEY W/METER SILVER 14FR (SET/KITS/TRAYS/PACK) IMPLANT
WATER STERILE IRR 1000ML POUR (IV SOLUTION) ×3 IMPLANT

## 2016-03-18 NOTE — Interval H&P Note (Signed)
History and Physical Interval Note:  03/18/2016 7:24 AM  Ethan Rose  has presented today for surgery, with the diagnosis of Cervical stenosis, Cervical disc disease with myelopathy  The various methods of treatment have been discussed with the patient and family. After consideration of risks, benefits and other options for treatment, the patient has consented to  Procedure(s) with comments: C4-5 C5-6 C6-7 Anterior cervical decompression/diskectomy/fusion (N/A) - C4-5 C5-6 C6-7 Anterior cervical decompression/diskectomy/fusion as a surgical intervention .  The patient's history has been reviewed, patient examined, no change in status, stable for surgery.  I have reviewed the patient's chart and labs.  Questions were answered to the patient's satisfaction.     [name]

## 2016-03-18 NOTE — Interval H&P Note (Signed)
History and Physical Interval Note:  03/18/2016 7:22 AM  Ethan Rose  has presented today for surgery, with the diagnosis of Cervical stenosis, Cervical disc disease with myelopathy  The various methods of treatment have been discussed with the patient and family. After consideration of risks, benefits and other options for treatment, the patient has consented to  Procedure(s) with comments: C4-5 C5-6 C6-7 Anterior cervical decompression/diskectomy/fusion (N/A) - C4-5 C5-6 C6-7 Anterior cervical decompression/diskectomy/fusion as a surgical intervention .  The patient's history has been reviewed, patient examined, no change in status, stable for surgery.  I have reviewed the patient's chart and labs.  Questions were answered to the patient's satisfaction.     Juel Bellerose D

## 2016-03-18 NOTE — Anesthesia Postprocedure Evaluation (Signed)
Anesthesia Post Note  Patient: Ethan Rose  Procedure(s) Performed: Procedure(s) (LRB): Cervical four-five Cervical five-six Cervical six-seven Anterior cervical decompression/diskectomy/fusion (N/A)  Patient location during evaluation: PACU Anesthesia Type: General Level of consciousness: awake and awake and alert Pain management: pain level controlled Vital Signs Assessment: post-procedure vital signs reviewed and stable Respiratory status: spontaneous breathing, nonlabored ventilation and respiratory function stable Cardiovascular status: blood pressure returned to baseline Anesthetic complications: no    Last Vitals:  Filed Vitals:   03/18/16 1637 03/18/16 1714  BP: 148/84 165/98  Pulse: 79 91  Temp:  36.4 C  Resp:  20    Last Pain:  Filed Vitals:   03/18/16 1718  PainSc: 0-No pain                 Piers Baade,Shields COKER

## 2016-03-18 NOTE — Brief Op Note (Signed)
03/18/2016  2:09 PM  PATIENT:  Ethan Rose  69 y.o. male  PRE-OPERATIVE DIAGNOSIS:  Cervical stenosis, Cervical disc disease with myelopathy, cord compression, cervical kyphosis, herniated cervical discs C 45, C 56, C 67 levels  POST-OPERATIVE DIAGNOSIS:  Cervical stenosis, Cervical disc disease with myelopathy, cord compression, cervical kyphosis, herniated cervical discs C 45, C 56, C 67 levels   PROCEDURE:  Procedure(s) with comments: Cervical four-five Cervical five-six Cervical six-seven Anterior cervical decompression/diskectomy/fusion (N/A) - n with PEEK cages, autograft, allograft, plate  SURGEON:  Surgeon(s) and Role:    * Dontarious Schaum, MD - Primary    * Neelesh Nundkumar, MD - Assisting  PHYSICIAN ASSISTANT:   ASSISTANTS: Poteat, RN   ANESTHESIA:   general  EBL:  Total I/O In: 1300 [I.V.:1300] Out: 275 [Urine:200; Blood:75]  BLOOD ADMINISTERED:none  DRAINS: (10) Jackson-Pratt drain(s) with closed bulb suction in the prevertebral space   LOCAL MEDICATIONS USED:  LIDOCAINE   SPECIMEN:  No Specimen  DISPOSITION OF SPECIMEN:  N/A  COUNTS:  YES  TOURNIQUET:  * No tourniquets in log *  DICTATION: Patient was brought to operating room and following the smooth and uncomplicated induction of general endotracheal anesthesia his head was placed on a horseshoe head holder he was placed in 5 pounds of Holter traction and his anterior neck was prepped and draped in usual sterile fashion. An incision was made on the left side of midline after infiltrating the skin and subcutaneous tissues with local lidocaine. The platysmal layer was incised and subplatysmal dissection was performed exposing the anterior border sternocleidomastoid muscle. Using blunt dissection the carotid sheath was kept lateral and trachea and esophagus kept medial exposing the anterior cervical spine. A bent spinal needle was placed it was felt to be the C 34 and C 45 levels and this was confirmed on  intraoperative x-ray. Longus coli muscles were taken down from the anterior cervical spine using electrocautery and key elevator and self-retaining retractor was placed. The interspace at C 56 was incised and a thorough discectomy was performed. Distraction pins were placed. Uncinate spurs and central spondylitic ridges were drilled down with a high-speed drill. The disc space was partially fused and required extensive drilling with a high speed drill. The spinal cord dura and both C 6 nerve roots were widely decompressed. Hemostasis was assured. After trial sizing a 6 mm peek interbody cage was selected and packed with local autograft and DBM. The graft was tamped into position and countersunk appropriately. The retractor was moved and the interspace at C 45 was incised and a thorough discectomy was performed. Distraction pins were placed. Uncinate spurs and central spondylitic ridges were drilled down with a high-speed drill. The spinal cord dura and both C 5 nerve roots were widely decompressed. Hemostasis was assured. After trial sizing a 7 mm peek interbody cage was selected and packed in a similar fashion.  The graft was tamped into position and countersunk appropriately.The interspace at C 67 was incised and a thorough discectomy was performed. Distraction pins were placed. Uncinate spurs and central spondylitic ridges were drilled down with a high-speed drill. The spinal cord dura and both C 7 nerve roots were widely decompressed.  Hemostasis was assured. After trial sizing an 8 mm peek interbody cage was selected and packed in a similar fashion. The graft was tamped into position and countersunk appropriately.  Distraction weight was removed. A 54 mm Nuvasive Archon anterior cervical plate was affixed to the cervical spine with 13 mm variable-angle   screws 2 at C4, 2 at C5, 2 at C6,  and 2 at C7. All screws were well-positioned and locking mechanisms were engaged. Soft tissues were inspected and found to be  in good repair. The wound was irrigated. A final x-ray was obtained with good visualization at C4 with the interbody graft well visualized. A #10 JP was inserted through a separate stab incision. The platysma layer was closed with 3-0 Vicryl stitches and the skin was reapproximated with 3-0 Vicryl subcuticular stitches. The wound was dressed with Dermabond and an occlusive dressing. Counts were correct at the end of the case. Patient was extubated and taken to recovery in stable and satisfactory condition.    PLAN OF CARE: Admit to inpatient   PATIENT DISPOSITION:  PACU - hemodynamically stable.   Delay start of Pharmacological VTE agent (>24hrs) due to surgical blood loss or risk of bleeding: yes

## 2016-03-18 NOTE — Progress Notes (Signed)
Awake, alert, conversant.  Improved bilateral upper extremity strength, including bilateral hand intrinsics.  Still quite weak in legs.  Doing well.

## 2016-03-18 NOTE — Anesthesia Procedure Notes (Addendum)
Procedure Name: Intubation Date/Time: 03/18/2016 11:15 AM Performed by: Bethel Born Pre-anesthesia Checklist: Patient identified, Timeout performed, Emergency Drugs available, Suction available and Patient being monitored Patient Re-evaluated:Patient Re-evaluated prior to inductionOxygen Delivery Method: Circle system utilized Preoxygenation: Pre-oxygenation with 100% oxygen Intubation Type: IV induction Ventilation: Mask ventilation without difficulty Laryngoscope Size: Glidescope and 4 Grade View: Grade I Tube size: 7.5 mm Number of attempts: 1 Airway Equipment and Method: Stylet and Video-laryngoscopy Placement Confirmation: ETT inserted through vocal cords under direct vision,  breath sounds checked- equal and bilateral and positive ETCO2 Secured at: 23 cm Tube secured with: Tape Dental Injury: Teeth and Oropharynx as per pre-operative assessment  Comments: Surgeon requested glidescope for cervical myelopathy.  Head and neck remained in neutral position during induction.

## 2016-03-18 NOTE — H&P (Signed)
Patient ID:   [id number] Patient: Ethan Rose  Date of Birth: Sep 26, 1946 Visit Type: Office Visit   Date: 03/16/2016 10:15 AM Provider: Marchia Meiers. Vertell Limber MD   This 70 year old male presents for back pain and Weakness.  History of Present Illness: 1.  back pain    Patient here to review MRI  03/16/16 MRI:  At C4-5 there is a broad central disc protrusion slightly eccentric towards the right. Mild left foraminal stenosis. No right foraminal stenosis. At C5-6 there is a broad disc osteophyte complex with a more focal right paracentral component abutting the ventral cervical spinal cord resulting in moderate spinal stenosis. Bilateral uncovertebral degenerative changes with severe bilateral foraminal stenosis. At C6-7 there is a broad based disc osteophyte complex effacing the ventral CSF space with moderate spinal stenosis. Bilateral uncovertebral degenerative changes with severe right and moderate left foraminal stenosis.  The patient has nerve compression in the cervical spine. There is 6.5 mm of space at C6-7, 6.5 mm at C5-6, 7.7 mm at C4-5, and 9.9 mm at C3-4. He has normal 13 mm space at the C7-T1 level.    2.  Weakness    The patient's cervical myelopathy seems to be causing his weakness, but his weakness continues to worsen at a rapid rate. He has moderate leg weakness, but his hands are severely weak.      Medical/Surgical/Interim History Reviewed, no change.  Last detailed document date:09/30/2014.   PAST MEDICAL HISTORY, SURGICAL HISTORY, FAMILY HISTORY, SOCIAL HISTORY AND REVIEW OF SYSTEMS  12/14/2015, which I have signed.  Family History: Reviewed, no changes.  Last detailed document: 09/30/2014.   Social History: Tobacco use reviewed. Reviewed, no changes. Last detailed document date: 09/30/2014.      MEDICATIONS(added, continued or stopped this visit): None.   ALLERGIES: Ingredient Reaction Medication Name Comment  NO KNOWN ALLERGIES     No known  allergies.    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 He has weakness in the LEs and hands and is slowly losing function. Patient has decreased mobility. No evidence for ALS or degenerative processes. At this time, I am recommending ACDF C4-7.  Completed Orders (this encounter) Order Details Reason Side Interpretation Result Initial Treatment Date Region  Hypertension education Continue to monitor blood pressure.  If blood pressure remains elevated, contact your primary care physician        Cervical Spine- AP/Lat/Flex/Ex      03/16/2016 All Levels to All Levels   Assessment/Plan # Detail Type Description   1. Assessment Elevated blood-pressure reading, w/o diagnosis of htn (R03.0).       2. Assessment Cervical stenosis of spinal canal (M48.02).       3. Assessment Cervical disc disease with myelopathy (M50.00).         Pain Assessment/Treatment Pain Scale: 0/10. Method: Numeric Pain Intensity Scale. Onset: 09/14/1994.  Fall Risk Plan The patient has not fallen in the last year.  Surgery scheduled for this Friday. If the patient continues to lose function, then we discussed seeing a neurologist.   Orders: Diagnostic Procedures: Assessment Procedure  M50.00 ACDF - C4-C5 - C5-C6 - C6-C7  M50.00 Cervical Spine- AP/Lat/Flex/Ex  M50.00 Cervical Spine- Lateral  Instruction(s)/Education: Assessment Instruction  R03.0 Hypertension education  Provider:  Marchia Meiers. Vertell Limber MD  03/16/2016 01:16 PM Dictation edited by: Marchia Meiers. Vertell Limber    CC Providers: [name] MD [address] [place], [place] [id number]              Electronically signed by Marchia Meiers. Vertell Limber MD on 03/18/2016 07:20 AM

## 2016-03-18 NOTE — Progress Notes (Signed)
Patient arrived to 5M15 from PACU alert at this time. He denied pain. He is able to ambulated to bathroom with moderate assistance with a walker. Patient voided. IVs infiltrated. Safety precautions and orders reviewed with patient at this time. No other distress noted. Will continue to monitor.   Ave Filter, RN

## 2016-03-18 NOTE — Op Note (Signed)
03/18/2016  2:09 PM  PATIENT:  Ethan Rose  70 y.o. male  PRE-OPERATIVE DIAGNOSIS:  Cervical stenosis, Cervical disc disease with myelopathy, cord compression, cervical kyphosis, herniated cervical discs C 45, C 56, C 67 levels  POST-OPERATIVE DIAGNOSIS:  Cervical stenosis, Cervical disc disease with myelopathy, cord compression, cervical kyphosis, herniated cervical discs C 45, C 56, C 67 levels   PROCEDURE:  Procedure(s) with comments: Cervical four-five Cervical five-six Cervical six-seven Anterior cervical decompression/diskectomy/fusion (N/A) - n with PEEK cages, autograft, allograft, plate  SURGEON:  Surgeon(s) and Role:    * [name], MD - Primary    * Consuella Lose, MD - Assisting  PHYSICIAN ASSISTANT:   ASSISTANTS: [place], RN   ANESTHESIA:   general  EBL:  Total I/O In: 1300 [I.V.:1300] Out: 275 [Urine:200; Blood:75]  BLOOD ADMINISTERED:none  DRAINS: (10) Jackson-Pratt drain(s) with closed bulb suction in the prevertebral space   LOCAL MEDICATIONS USED:  LIDOCAINE   SPECIMEN:  No Specimen  DISPOSITION OF SPECIMEN:  N/A  COUNTS:  YES  TOURNIQUET:  * No tourniquets in log *  DICTATION: Patient was brought to operating room and following the smooth and uncomplicated induction of general endotracheal anesthesia his head was placed on a horseshoe head holder he was placed in 5 pounds of Holter traction and his anterior neck was prepped and draped in usual sterile fashion. An incision was made on the left side of midline after infiltrating the skin and subcutaneous tissues with local lidocaine. The platysmal layer was incised and subplatysmal dissection was performed exposing the anterior border sternocleidomastoid muscle. Using blunt dissection the carotid sheath was kept lateral and trachea and esophagus kept medial exposing the anterior cervical spine. A bent spinal needle was placed it was felt to be the C 34 and C 45 levels and this was confirmed on  intraoperative x-ray. Longus coli muscles were taken down from the anterior cervical spine using electrocautery and key elevator and self-retaining retractor was placed. The interspace at C 56 was incised and a thorough discectomy was performed. Distraction pins were placed. Uncinate spurs and central spondylitic ridges were drilled down with a high-speed drill. The disc space was partially fused and required extensive drilling with a high speed drill. The spinal cord dura and both C 6 nerve roots were widely decompressed. Hemostasis was assured. After trial sizing a 6 mm peek interbody cage was selected and packed with local autograft and DBM. The graft was tamped into position and countersunk appropriately. The retractor was moved and the interspace at C 45 was incised and a thorough discectomy was performed. Distraction pins were placed. Uncinate spurs and central spondylitic ridges were drilled down with a high-speed drill. The spinal cord dura and both C 5 nerve roots were widely decompressed. Hemostasis was assured. After trial sizing a 7 mm peek interbody cage was selected and packed in a similar fashion.  The graft was tamped into position and countersunk appropriately.The interspace at C 67 was incised and a thorough discectomy was performed. Distraction pins were placed. Uncinate spurs and central spondylitic ridges were drilled down with a high-speed drill. The spinal cord dura and both C 7 nerve roots were widely decompressed.  Hemostasis was assured. After trial sizing an 8 mm peek interbody cage was selected and packed in a similar fashion. The graft was tamped into position and countersunk appropriately.  Distraction weight was removed. A 54 mm Nuvasive Archon anterior cervical plate was affixed to the cervical spine with 13 mm variable-angle  screws 2 at C4, 2 at C5, 2 at C6,  and 2 at C7. All screws were well-positioned and locking mechanisms were engaged. Soft tissues were inspected and found to be  in good repair. The wound was irrigated. A final x-ray was obtained with good visualization at C4 with the interbody graft well visualized. A #10 JP was inserted through a separate stab incision. The platysma layer was closed with 3-0 Vicryl stitches and the skin was reapproximated with 3-0 Vicryl subcuticular stitches. The wound was dressed with Dermabond and an occlusive dressing. Counts were correct at the end of the case. Patient was extubated and taken to recovery in stable and satisfactory condition.    PLAN OF CARE: Admit to inpatient   PATIENT DISPOSITION:  PACU - hemodynamically stable.   Delay start of Pharmacological VTE agent (>24hrs) due to surgical blood loss or risk of bleeding: yes

## 2016-03-18 NOTE — Anesthesia Preprocedure Evaluation (Signed)
Anesthesia Evaluation  Patient identified by MRN, date of birth, ID band Patient awake    Reviewed: Allergy & Precautions, NPO status , Patient's Chart, lab work & pertinent test results  Airway Mallampati: II  TM Distance: >3 FB Neck ROM: Full    Dental  (+) Teeth Intact, Dental Advisory Given   Pulmonary former smoker,    breath sounds clear to auscultation       Cardiovascular  Rhythm:Regular Rate:Normal     Neuro/Psych    GI/Hepatic   Endo/Other    Renal/GU      Musculoskeletal   Abdominal   Peds  Hematology   Anesthesia Other Findings   Reproductive/Obstetrics                             Anesthesia Physical Anesthesia Plan  ASA: II  Anesthesia Plan: General   Post-op Pain Management:    Induction: Intravenous  Airway Management Planned: Oral ETT  Additional Equipment:   Intra-op Plan:   Post-operative Plan:   Informed Consent: I have reviewed the patients History and Physical, chart, labs and discussed the procedure including the risks, benefits and alternatives for the proposed anesthesia with the patient or authorized representative who has indicated his/her understanding and acceptance.   Dental advisory given  Plan Discussed with: CRNA and Anesthesiologist  Anesthesia Plan Comments:         Anesthesia Quick Evaluation

## 2016-03-18 NOTE — Transfer of Care (Signed)
Immediate Anesthesia Transfer of Care Note  Patient: Ethan Rose  Procedure(s) Performed: Procedure(s) with comments: Cervical four-five Cervical five-six Cervical six-seven Anterior cervical decompression/diskectomy/fusion (N/A) - n  Patient Location: PACU  Anesthesia Type:General  Level of Consciousness: alert, oriented, cooperative, moves extremities x4  Airway & Oxygen Therapy: Patient Spontanous Breathing and Patient connected to nasal cannula oxygen  Post-op Assessment: Report given to RN and Post -op Vital signs reviewed and stable  Post vital signs: Reviewed and stable  Last Vitals:  Filed Vitals:   03/18/16 0851 03/18/16 1350  BP: 157/83 168/94  Pulse:  79  Temp:  36.8 C  Resp:  19    Last Pain: There were no vitals filed for this visit.    Patients Stated Pain Goal: 2 ([id number])  Complications: No apparent anesthesia complications

## 2016-03-18 NOTE — Progress Notes (Signed)
IV team noted to bedside placing new PIV.

## 2016-03-19 NOTE — Progress Notes (Signed)
Filed Vitals:   03/18/16 2107 03/19/16 0137 03/19/16 0522 03/19/16 0924  BP: 145/70 132/71 116/67 141/74  Pulse: 100 79 68 73  Temp: 98.4 F (36.9 C) 98 F (36.7 C) 98.3 F (36.8 C) 98.6 F (37 C)  TempSrc: Oral Oral Oral Oral  Resp: 20 20 20 16   Height:      Weight:      SpO2: 95% 97% 98% 98%    CBC  Recent Labs  03/18/16 0834  WBC 11.4*  HGB 15.4  HCT 46.2  PLT 212   BMET  Recent Labs  03/18/16 0834  NA 140  K 4.5  CL 99*  CO2 26  GLUCOSE 128*  BUN 11  CREATININE 0.44*  CALCIUM 9.6    Patient working with physical therapy, but has significant instability and weakness (deconditioning). Able to walk only limited distances, using a rolling walker. Dressing clean and dry.  Plan: Continue to progress through postoperative recovery. Dr. Vertell Limber has arranged for subsequent rehabilitation at [facility], patient explains that he is to be transferred on May 8.  Hosie Spangle, MD 03/19/2016, 10:09 AM

## 2016-03-19 NOTE — Progress Notes (Signed)
Patient asked to walk to the bathroom and refused the urinal and BSC. Patient is wearing an aspen collar and reported that previously when OOB he became very weak and almost fell. RN explained the importance of falls and safety precaution but patient insisted on walking into the bathroom. RN asked the CNA for assistance to ensure he that walked safely into the bathroom with fall or injury. Assisted patient to bathroom and back to bed. Callbell within reach. Bed alarm set for safety. Patient stated that he does not really want to take any pain medication because it causes constipation for him. RN encouraged the patient to call for assistance for pain concerns if needed. Will continue to monitor.

## 2016-03-19 NOTE — NC FL2 (Signed)
[place] LEVEL OF CARE SCREENING TOOL     IDENTIFICATION  Patient Name: Ethan Rose Birthdate: 1946/08/09 Sex: male Admission Date (Current Location): 03/18/2016  [facility] and [place] Number:  Herbalist and Address:  [facility], [place] [address] [place], [place] [address]      Provider Number: [id number]  Attending Physician Name and Address:  [name], MD  Relative Name and Phone Number:       Current Level of Care: Hospital Recommended Level of Care: [place] Prior Approval Number:    Date Approved/Denied:   PASRR Number:    Discharge Plan: Home    Current Diagnoses: Patient Active Problem List   Diagnosis Date Noted  . Cervical myelopathy ([place]) 03/18/2016  . 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 Other malaise and fatigue 05/14/2014  . Health maintenance examination 04/29/2013  . Weakness of both legs 04/29/2013  . Leg cramps 04/29/2013  . Elevated blood pressure reading without diagnosis of hypertension 03/30/2012  . Dizziness 03/30/2012    Orientation RESPIRATION BLADDER Height & Weight     Self, Time, Situation, Place  Normal Continent Weight: 158 lb (71.668 kg) Height:  5\' 9"  (175.3 cm)  BEHAVIORAL SYMPTOMS/MOOD NEUROLOGICAL BOWEL NUTRITION STATUS      Continent Diet (Heart)  AMBULATORY STATUS COMMUNICATION OF NEEDS Skin   Extensive Assist Verbally Surgical wounds (Incision(Closed)05/05/17Neck)                       Personal Care Assistance Level of Assistance  Bathing, Dressing Bathing Assistance: Limited assistance   Dressing Assistance: Limited assistance     Functional Limitations Info             SPECIAL CARE FACTORS FREQUENCY  PT (By licensed PT), OT (By licensed OT)     PT Frequency: 5 OT Frequency: 5            Contractures       Additional Factors Info  Code Status, Allergies Code Status Info: Fullcode Allergies Info: NKDA           Current Medications (03/19/2016):  This is the current hospital active medication list Current Facility-Administered Medications  Medication Dose Route Frequency Provider Last Rate Last Dose  . 0.9 %  sodium chloride infusion  250 mL Intravenous Continuous [name], MD      . acetaminophen (TYLENOL) tablet 650 mg  650 mg Oral Q4H PRN [name], MD       Or  . acetaminophen (TYLENOL) suppository 650 mg  650 mg Rectal Q4H PRN [name], MD      . acidophilus (RISAQUAD) capsule 1 capsule  1 capsule Oral TID WC [name], MD   1 capsule at 03/19/16 0835  . alum & mag hydroxide-simeth (MAALOX/MYLANTA) 200-200-20 MG/5ML suspension 30 mL  30 mL Oral Q6H PRN [name], MD      . bisacodyl (DULCOLAX) suppository 10 mg  10 mg Rectal Daily PRN [name], MD      . cholecalciferol (VITAMIN D) tablet 5,000 Units  5,000 Units Oral Daily [name], MD   5,000 Units at 03/19/16 1034  . dextrose 5 % and 0.45 % NaCl with KCl 20 mEq/L infusion   Intravenous Continuous [name], MD 75 mL/hr at 03/18/16 2138    . docusate sodium (COLACE) capsule 100 mg  100 mg  Oral BID [name], MD   100 mg at 03/19/16 1000  . HYDROcodone-acetaminophen (NORCO/VICODIN) 5-325 MG per tablet 1-2 tablet  1-2 tablet Oral Q4H PRN [name], MD      . HYDROmorphone (DILAUDID) injection 0.5-1 mg  0.5-1 mg Intravenous Q2H PRN [name], MD      . loratadine (CLARITIN) tablet 10 mg  10 mg Oral Daily PRN [name], MD      . menthol-cetylpyridinium (CEPACOL) lozenge 3 mg  1 lozenge Oral PRN [name], MD       Or  . phenol (CHLORASEPTIC) mouth spray 1 spray  1 spray Mouth/Throat PRN [name], MD      . methocarbamol (ROBAXIN) tablet 500 mg  500 mg Oral Q6H PRN [name], MD       Or  . methocarbamol (ROBAXIN) 500 mg in dextrose 5 % 50 mL IVPB  500 mg Intravenous Q6H PRN [name], MD      . multivitamin with minerals tablet 1 tablet  1 tablet Oral Daily [name], MD   1 tablet at 03/19/16 1135  . ondansetron (ZOFRAN) injection 4 mg  4 mg Intravenous Q4H PRN [name], MD      . oxyCODONE-acetaminophen (PERCOCET/ROXICET) 5-325 MG per tablet 1-2 tablet  1-2 tablet Oral Q4H PRN [name], MD   2 tablet at 03/18/16 1511  . pantoprazole (PROTONIX) EC tablet 40 mg  40 mg Oral QHS Eudelia Bunch, RPH   40 mg at 03/18/16 2139  . polyethylene glycol (MIRALAX / GLYCOLAX) packet 17 g  17 g Oral Daily PRN [name], MD      . sodium chloride flush (NS) 0.9 % injection 3 mL  3 mL Intravenous Q12H [name], MD      . sodium chloride flush (NS) 0.9 % injection 3 mL  3 mL Intravenous PRN [name], MD      . sodium phosphate (FLEET) 7-19 GM/118ML enema 1 enema  1 enema Rectal Once PRN [name], MD      . zolpidem Lorrin Mais) tablet 5 mg  5 mg Oral QHS PRN [name], MD         Discharge Medications: Please see discharge summary for a list of discharge medications.  Relevant Imaging Results:  Relevant Lab Results:   Additional Information SSN: [id number]  Standley Brooking, LCSW

## 2016-03-19 NOTE — Progress Notes (Signed)
Patient worked with PT, sat in the chair for most part of his day, and now back in bed. SCDs, back on his feet, he was able to ambulate to the bathroom with standby assist several times today, he denies having any pain or discomfort. Will continue to monitor.

## 2016-03-19 NOTE — Evaluation (Signed)
Physical Therapy Evaluation Patient Details Name: JOJUAN ALVERSON MRN: [id number] DOB: October 21, 1946 Today's Date: 03/19/2016   History of Present Illness  Patient is a 70 y/o male admitted with U/LE weakness previous h/o T10-iliac fusion last year.  Found to have with cervical myelopathy so now s/p C4-5, C5-6, C6-7 ACDF.  Clinical Impression  Patient presents with decreased independence with mobility due to deficits listed in PT problem list.  He will benefit from skilled PT in the acute setting to allow return home following SNF level rehab stay.      Follow Up Recommendations SNF;Supervision/Assistance - 24 hour    Equipment Recommendations  None recommended by PT    Recommendations for Other Services       Precautions / Restrictions Precautions Precautions: Cervical;Fall Required Braces or Orthoses: Cervical Brace Cervical Brace: Hard collar      Mobility  Bed Mobility Overal bed mobility: Needs Assistance Bed Mobility: Rolling;Sidelying to Sit Rolling: Supervision Sidelying to sit: Supervision       General bed mobility comments: used rail to come up to sit  Transfers Overall transfer level: Needs assistance Equipment used: 4-wheeled walker Transfers: Sit to/from Stand Sit to Stand: Min assist         General transfer comment: steadying assist up from EOB and seat on rollator; relies heavily on UE support  Ambulation/Gait Ambulation/Gait assistance: Min assist Ambulation Distance (Feet): 80 Feet (and 40') Assistive device: 4-wheeled walker Gait Pattern/deviations: Step-through pattern;Decreased dorsiflexion - right;Decreased dorsiflexion - left;Decreased stride length;Steppage;Trunk flexed     General Gait Details: heavy UE support on walker and flexed posture, raised height of handles at end of session; pt. had to stop and sit on walker in hallway due to LE's giving away  Stairs            Wheelchair Mobility    Modified Rankin (Stroke Patients  Only)       Balance Overall balance assessment: Needs assistance Sitting-balance support: Feet supported;No upper extremity supported Sitting balance-Leahy Scale: Fair     Standing balance support: Bilateral upper extremity supported Standing balance-Leahy Scale: Poor Standing balance comment: UE support needed to stand                             Pertinent Vitals/Pain Pain Assessment: No/denies pain    Home Living Family/patient expects to be discharged to:: Skilled nursing facility Living Arrangements: Alone Available Help at Discharge: Family;Available PRN/intermittently Type of Home: House Home Access: Stairs to enter (has set up makeshift ramp for first two steps) Entrance Stairs-Rails: Right Entrance Stairs-Number of Steps: 3 Home Layout: One level Home Equipment: Walker - 4 wheels;Walker - 2 wheels;Shower seat;Shower seat - built in;Hand held shower head;Bedside commode;Wheelchair - manual Additional Comments: has 3:1 in shower and another over toilet; has high bed, some piece of sheeting on first two steps as ramp, so leaves one step up at top    Prior Function Level of Independence: Independent with assistive device(s)         Comments: states has worked hard in PT from [facility], to [place] to outpatient water therapy and lots of HEP with stationary bike, leg and arm weights over past year, but stll only can wakl about 100 steps prior to needing to sit; drives, but has housekeeper, neighbor makes his dinner     Hand Dominance        Extremity/Trunk Assessment   Upper Extremity Assessment: RUE deficits/detail;LUE deficits/detail RUE  Deficits / Details: grossly 4/5 with coordination and sensation intact; has had trouble with carpal tunnel symptoms in the past     LUE Deficits / Details: grossly 4/5 with coordination and sensation intact; has had trouble with carpal tunnel symptoms in the past   Lower Extremity Assessment: RLE deficits/detail;LLE  deficits/detail RLE Deficits / Details: hip flexion 3/5, knee extension 4-/5, ankle DF 1+/5, intact sensation and proprioception LLE Deficits / Details: hip flexion 2+/5, knee extension 4/5, ankle DF 1+/5, intact sensation and proprioception  Cervical / Trunk Assessment: Other exceptions  Communication   Communication: No difficulties  Cognition Arousal/Alertness: Awake/alert Behavior During Therapy: WFL for tasks assessed/performed Overall Cognitive Status: Within Functional Limits for tasks assessed                      General Comments General comments (skin integrity, edema, etc.): Educated on brace and adjusted for tighter fit; daughter in room and pt reports she stayed with him after going to [place] last surgery.  Reports has worked hard over past year to improve strength and feels like still not back to where he was prior to back surgery    Exercises General Exercises - Lower Extremity Heel Raises: Strengthening;Both;10 reps;Seated      Assessment/Plan    PT Assessment Patient needs continued PT services  PT Diagnosis Generalized weakness;Difficulty walking   PT Problem List Decreased strength;Decreased activity tolerance;Decreased balance;Decreased mobility;Decreased knowledge of precautions;Decreased safety awareness;Decreased knowledge of use of DME  PT Treatment Interventions     PT Goals (Current goals can be found in the Care Plan section) Acute Rehab PT Goals Patient Stated Goal: To go back to [facility] for rehab and work on LE strength PT Goal Formulation: With patient Time For Goal Achievement: 03/26/16 Potential to Achieve Goals: Good    Frequency Min 5X/week   Barriers to discharge Decreased caregiver support      Co-evaluation               End of Session Equipment Utilized During Treatment: Gait belt;Cervical collar Activity Tolerance: Patient tolerated treatment well Patient left: in chair;with call bell/phone within reach;with chair alarm  set;with family/visitor present           Time: [id number] PT Time Calculation (min) (ACUTE ONLY): 45 min   Charges:   PT Evaluation $PT Eval Moderate Complexity: 1 Procedure PT Treatments $Gait Training: 8-22 mins $Therapeutic Exercise: 8-22 mins   PT G Codes:        Reginia Naas 04/13/16, 11:59 AM Magda Kiel, [place] 04-13-16

## 2016-03-19 NOTE — Evaluation (Signed)
Occupational Therapy Evaluation Patient Details Name: Ethan Rose MRN: [id number] DOB: 19-Mar-1946 Today's Date: 03/19/2016    History of Present Illness Patient is a 70 y/o male admitted with U/LE weakness previous h/o T10-iliac fusion last year.  Found to have with cervical myelopathy so now s/p C4-5, C5-6, C6-7 ACDF.   Clinical Impression   Pt managing ADLs independently PTA. Currently pt overall min guard for safety with ADLs and functional mobility; mod assist for managing cervical collar. Pt presenting with UE and LE weakness impacting his independence and safety with ADLs and functional mobility; fatigues very quickly with short distance mobility. Recommend SNF for follow up therapy to maximize independence and safety with ADLs and functional mobility prior to return home. Pt would benefit from continued skilled OT to address established goals.    Follow Up Recommendations  SNF    Equipment Recommendations  None recommended by OT    Recommendations for Other Services       Precautions / Restrictions Precautions Precautions: Cervical;Fall Precaution Comments: Reviewed cervical precautions with pt. Required Braces or Orthoses: Cervical Brace Cervical Brace: Hard collar Restrictions Weight Bearing Restrictions: No      Mobility Bed Mobility Overal bed mobility: Needs Assistance Bed Mobility: Rolling;Sidelying to Sit Rolling: Supervision Sidelying to sit: Supervision;HOB elevated       General bed mobility comments: Pt with good log roll technique. HOB elevated with use of bed rail.  Transfers Overall transfer level: Needs assistance Equipment used: 4-wheeled walker Transfers: Sit to/from Stand Sit to Stand: Min guard         General transfer comment: Min guard for sit to stand from EOB x 2. Good hand placement and technique.    Balance Overall balance assessment: Needs assistance Sitting-balance support: Feet supported;No upper extremity  supported Sitting balance-Leahy Scale: Fair     Standing balance support: Bilateral upper extremity supported Standing balance-Leahy Scale: Poor Standing balance comment: RW for support                            ADL Overall ADL's : Needs assistance/impaired Eating/Feeding: Modified independent;Sitting   Grooming: Supervision/safety;Set up;Sitting   Upper Body Bathing: Supervision/ safety;Sitting;Set up   Lower Body Bathing: Min guard;Sit to/from stand   Upper Body Dressing : Sitting;Moderate assistance Upper Body Dressing Details (indicate cue type and reason): to don collar Lower Body Dressing: Min guard;Sit to/from stand   Toilet Transfer: Min guard;Ambulation;RW;BSC (BSC over toilet)   Toileting- Clothing Manipulation and Hygiene: Min guard;Sit to/from stand       Functional mobility during ADLs: Min guard;Rolling walker General ADL Comments: Pt c/o increased fatigue with short distance functional mobility; feels weak in his legs. Plan is for SNF upon d/c: [facility].     Vision     Perception     Praxis      Pertinent Vitals/Pain Pain Assessment: No/denies pain     Hand Dominance Left   Extremity/Trunk Assessment Upper Extremity Assessment Upper Extremity Assessment: RUE deficits/detail;LUE deficits/detail RUE Deficits / Details: grossly 4/5 with coordination and sensation intact; has had trouble with carpal tunnel symptoms in the past. Grip strength good but finger ab/adduction decreased (3+/5) LUE Deficits / Details: grossly 4/5 with coordination and sensation intact; has had trouble with carpal tunnel symptoms in the past. Grip strength good but finger ab/adduction decreased (3+/5)   Lower Extremity Assessment Lower Extremity Assessment: Defer to PT evaluation   Cervical / Trunk Assessment Cervical / Trunk  Assessment: Other exceptions Cervical / Trunk Exceptions: long midline spinal incision scar   Communication  Communication Communication: No difficulties   Cognition Arousal/Alertness: Awake/alert Behavior During Therapy: WFL for tasks assessed/performed Overall Cognitive Status: Within Functional Limits for tasks assessed                     General Comments       Exercises       Shoulder Instructions      Home Living Family/patient expects to be discharged to:: Skilled nursing facility Living Arrangements: Alone Available Help at Discharge: Family;Available PRN/intermittently Type of Home: House Home Access: Stairs to enter [employer] of Steps: 3 Entrance Stairs-Rails: Right Home Layout: One level     Bathroom Shower/Tub: Occupational psychologist: Standard Bathroom Accessibility: Yes How Accessible: Accessible via walker Home Equipment: [place] - 4 wheels;Walker - 2 wheels;Shower seat;Shower seat - built in;Hand held shower head;Bedside commode;Wheelchair - manual   Additional Comments: has 3:1 in shower and another over toilet; has high bed, some piece of sheeting on first two steps as ramp, so leaves one step up at top      Prior Functioning/Environment Level of Independence: Independent with assistive device(s)        Comments: states has worked hard in PT from [facility], to [place] to outpatient water therapy and lots of HEP with stationary bike, leg and arm weights over past year, but stll only can wakl about 100 steps prior to needing to sit; drives, but has housekeeper, neighbor makes his dinner    OT Diagnosis: Generalized weakness   OT Problem List: Decreased strength;Decreased activity tolerance;Impaired balance (sitting and/or standing);Decreased knowledge of precautions;Impaired UE functional use   OT Treatment/Interventions: Self-care/ADL training;Therapeutic exercise;Energy conservation;Patient/family education;Balance training    OT Goals(Current goals can be found in the care plan section) Acute Rehab OT Goals Patient Stated  Goal: To go back to [facility] for rehab and work on LE strength OT Goal Formulation: With patient Time For Goal Achievement: 04/02/16 Potential to Achieve Goals: Good ADL Goals Pt Will Perform Grooming: with supervision;standing Pt Will Transfer to Toilet: with supervision;ambulating;bedside commode (BSC over toilet) Pt Will Perform Toileting - Clothing Manipulation and hygiene: with supervision;sit to/from stand Pt Will Perform Tub/Shower Transfer: Shower transfer;with supervision;ambulating;3 in 1;rolling walker Pt/caregiver will Perform Home Exercise Program: Increased strength;Both right and left upper extremity;With theraputty;Independently;With written HEP provided Additional ADL Goal #1: Pt will independently don/doff cervical collar as precursor for ADLs and functional mobiltiy.  OT Frequency: Min 2X/week   Barriers to D/C: Decreased caregiver support  lives alone       Co-evaluation              End of Session Equipment Utilized During Treatment: Gait belt;Rolling walker;Cervical collar  Activity Tolerance: Patient tolerated treatment well Patient left: in chair;with call bell/phone within reach   Time: [id number] OT Time Calculation (min): 31 min Charges:  OT General Charges $OT Visit: 1 Procedure OT Evaluation $OT Eval Moderate Complexity: 1 Procedure OT Treatments $Therapeutic Activity: 8-22 mins G-Codes:     Binnie Kand M.S., OTR/L Pager: [phone]  03/19/2016, 5:23 PM

## 2016-03-20 NOTE — Progress Notes (Signed)
No issues overnight. Pt reports minimal difficulty swallowing, tolerating diet. Ambulating well with PT/OT.  EXAM:  BP 133/71 mmHg  Pulse 90  Temp(Src) 98.6 F (37 C) (Oral)  Resp 20  Ht 5\' 9"  (1.753 m)  Wt 71.668 kg (158 lb)  BMI 23.32 kg/m2  SpO2 99%  Awake, alert, oriented  Speech fluent, appropriate  CN grossly intact  Mild BUE and proximal BLE weakness Drain in place, minimal serosanguinous output  IMPRESSION:  70 y.o. male POD#2 s/p C45, 15, 61 ACDF, doing well  PLAN: - D/C drain - Cont to mobilize - Plan on SNF placement, possibly tomorrow

## 2016-03-20 NOTE — Clinical Social Work Placement (Signed)
   CLINICAL SOCIAL WORK PLACEMENT  NOTE  Date:  03/20/2016  Patient Details  Name: Ethan Rose MRN: [id number] Date of Birth: December 18, 1945  Clinical Social Work is seeking post-discharge placement for this patient at the [place] level of care (*CSW will initial, date and re-position this form in  chart as items are completed):  Yes   Patient/family provided with [place] Work Department's list of facilities offering this level of care within the geographic area requested by the patient (or if unable, by the patient's family).  Yes   Patient/family informed of their freedom to choose among providers that offer the needed level of care, that participate in Medicare, Medicaid or managed care program needed by the patient, have an available bed and are willing to accept the patient.  Yes   Patient/family informed of [facility]'s ownership interest in [facility] and [facility], as well as of the fact that they are under no obligation to receive care at these facilities.  PASRR submitted to EDS on       PASRR number received on       Existing PASRR number confirmed on       FL2 transmitted to all facilities in geographic area requested by pt/family on 03/20/16     FL2 transmitted to all facilities within larger geographic area on       Patient informed that his/her managed care company has contracts with or will negotiate with certain facilities, including the following:        Yes   Patient/family informed of bed offers received.  Patient [facility] bed at [facility]     Physician recommends and patient chooses bed at      Patient to be transferred to [facility] on 03/21/16.  Patient to be transferred to facility by       Patient family notified on   of transfer.  Name of family member notified:        PHYSICIAN Please sign FL2     Additional Comment:    Barbette Or, [employer]

## 2016-03-20 NOTE — Progress Notes (Signed)
Patient in bed this morning, alert, denies having any pain/discomfort. Will continue to monitor.

## 2016-03-20 NOTE — Progress Notes (Signed)
Physical Therapy Treatment Patient Details Name: Ethan Rose MRN: [id number] DOB: 26-Nov-1945 Today's Date: 03/20/2016    History of Present Illness Patient is a 70 y/o male admitted with U/LE weakness previous h/o T10-iliac fusion last year.  Found to have with cervical myelopathy so now s/p C4-5, C5-6, C6-7 ACDF.    PT Comments    Pt presents with some improvement in LE strength and function this visit, including improved muscle power and endurance, however remains significantly impaired impacting ability to move around independently.  He is encouraged by the functional changes he's experiencing and hopeful postacute rehab will be successful.  See below for details of session and care plan for goals of care.     Follow Up Recommendations  SNF;Supervision/Assistance - 24 hour     Equipment Recommendations  None recommended by PT    Recommendations for Other Services       Precautions / Restrictions Precautions Precautions: Cervical;Fall Precaution Booklet Issued: No (given previously) Precaution Comments: Reviewed cervical precautions with pt, specifically use of UEs Required Braces or Orthoses: Cervical Brace Cervical Brace: Hard collar Restrictions Weight Bearing Restrictions: No    Mobility  Bed Mobility   Bed Mobility: Rolling;Sidelying to Sit;Sit to Sidelying Rolling: Supervision Sidelying to sit: Supervision;HOB elevated     Sit to sidelying: Supervision General bed mobility comments: HOB flat, pt uses rail to assist with log roll, able to move legs on/off bed unassisted with standby for coming to full sitting  Transfers Overall transfer level: Needs assistance Equipment used: 4-wheeled walker Transfers: Sit to/from Stand Sit to Stand: Min guard         General transfer comment: repeated sit to stand x 5 untimed but definite dependency on hands to assist and struggles with terminal standing esp transition from hands on bed to RW and with terminal sitting  as eccentric control limited; instructed in 'power up/control down' method to encourage optimal muscle performance  Ambulation/Gait Ambulation/Gait assistance: Supervision Ambulation Distance (Feet): 100 Feet (2x 50') Assistive device: 4-wheeled walker Gait Pattern/deviations: Steppage;Trunk flexed Gait velocity: 2.02 ft/sec Gait velocity interpretation: Below normal speed for age/gender General Gait Details: improved muscle endurance as pt able to perform full distance without rest, and repeated after 2-3 min rest.  typically uses AFOs bilaterally to counter steppage gait (see general comments for MMT results)   Stairs            Wheelchair Mobility    Modified Rankin (Stroke Patients Only)       Balance Overall balance assessment: Needs assistance Sitting-balance support: No upper extremity supported;Feet supported Sitting balance-Leahy Scale: Fair Sitting balance - Comments: at minimum..   Standing balance support: Bilateral upper extremity supported;During functional activity Standing balance-Leahy Scale: Poor Standing balance comment: dependent on external support to maintain static standing                    Cognition Arousal/Alertness: Awake/alert Behavior During Therapy: WFL for tasks assessed/performed Overall Cognitive Status: Within Functional Limits for tasks assessed                      Exercises      General Comments General comments (skin integrity, edema, etc.): MMT: LLE= 3-/5 (hip), 3+/5 (knee), 0-1/5 (DF), 4+/5 (PF);  RLE= 3+/5 (hip), 3-/5 (knee), 1/5 (DF), 4+/5 (PF)      Pertinent Vitals/Pain Pain Assessment: No/denies pain Pain Score: 0-No pain    Home Living  Prior Function            PT Goals (current goals can now be found in the care plan section) Acute Rehab PT Goals Patient Stated Goal: To go back to [facility] for rehab and work on LE strength Progress towards PT goals: Progressing  toward goals    Frequency  Min 5X/week    PT Plan Current plan remains appropriate    Co-evaluation             End of Session Equipment Utilized During Treatment: Gait belt;Cervical collar Activity Tolerance: Patient tolerated treatment well Patient left: in bed;with call bell/phone within reach;with nursing/sitter in room     Time: 1020-1102 PT Time Calculation (min) (ACUTE ONLY): 42 min  Charges:  $Gait Training: 8-22 mins $Therapeutic Exercise: 8-22 mins $Therapeutic Activity: 8-22 mins $Neuromuscular Re-education: 8-22 mins                    G Codes:      Herbie Drape 03/20/2016, 11:20 AM

## 2016-03-20 NOTE — Clinical Social Work Note (Signed)
Clinical Social Work Assessment  Patient Details  Name: Ethan Rose MRN: [id number] Date of Birth: 12/06/1945  Date of referral:  03/20/16               Reason for consult:  Facility Placement                Permission sought to share information with:  Family Supports Permission granted to share information::  Yes, Verbal Permission Granted  Name::     Marc Morgans  Relationship::  Daughter  Contact Information:  [phone]  Housing/Transportation Living arrangements for the past 2 months:  Single Family Home Source of Information:  Patient Patient Interpreter Needed:  None Criminal Activity/Legal Involvement Pertinent to Current Situation/Hospitalization:  No - Comment as needed Significant Relationships:  Adult Children Lives with:  Self Do you feel safe going back to the place where you live?  Yes Need for family participation in patient care:  No (Coment)  Care giving concerns:  No family/friends at bedside, however no concerns addressed at this time.   Social Worker assessment / plan:  Holiday representative met with patient at bedside to offer support and discuss patient needs at discharge.  Patient states that he has been to [facility] in the past and has made arrangements to return following this surgery.  CSW contacted facility who confirms that patient has made arrangements prior to hospitalization and plan is for discharge to SNF on Monday 05/08.  CSW completed FL2 and will facilitate patient discharge needs once medically stable.  Employment status:  Retired Forensic scientist:  Medicare PT Recommendations:  [place] / Referral to community resources:  [place]  Patient/Family's Response to care:  Patient agreeable with discharge to [facility] but did not specify form of transport at discharge.  Patient verbalized appreciation for CSW support and concern.  Patient/Family's Understanding of and Emotional Response  to Diagnosis, Current Treatment, and Prognosis:  Patient understanding of current surgical repair and limitations following surgery.  Patient has been through this process in the past and is hopeful for a quick recovery.  Emotional Assessment Appearance:  Appears stated age Attitude/Demeanor/Rapport:   (Appropriate, Engaged and Cooperative) Affect (typically observed):  Appropriate, Calm, Pleasant Orientation:  Oriented to Self, Oriented to Place, Oriented to  Time, Oriented to Situation Alcohol / Substance use:  Not Applicable Psych involvement (Current and /or in the community):  No (Comment)  Discharge Needs  Concerns to be addressed:  Discharge Planning Concerns Readmission within the last 30 days:  No Current discharge risk:  None Barriers to Discharge:  Continued Medical Work up  [employer] (Weekend Coverage) [phone]

## 2016-03-21 ENCOUNTER — Encounter (HOSPITAL_COMMUNITY): Payer: Self-pay | Admitting: Neurosurgery

## 2016-03-21 DIAGNOSIS — M419 Scoliosis, unspecified: Secondary | ICD-10-CM | POA: Diagnosis not present

## 2016-03-21 DIAGNOSIS — M4802 Spinal stenosis, cervical region: Secondary | ICD-10-CM | POA: Diagnosis not present

## 2016-03-21 DIAGNOSIS — M6281 Muscle weakness (generalized): Secondary | ICD-10-CM | POA: Diagnosis not present

## 2016-03-21 DIAGNOSIS — R030 Elevated blood-pressure reading, without diagnosis of hypertension: Secondary | ICD-10-CM | POA: Diagnosis not present

## 2016-03-21 DIAGNOSIS — J309 Allergic rhinitis, unspecified: Secondary | ICD-10-CM | POA: Diagnosis not present

## 2016-03-21 DIAGNOSIS — G4700 Insomnia, unspecified: Secondary | ICD-10-CM | POA: Diagnosis not present

## 2016-03-21 DIAGNOSIS — R5381 Other malaise: Secondary | ICD-10-CM | POA: Diagnosis not present

## 2016-03-21 DIAGNOSIS — G959 Disease of spinal cord, unspecified: Secondary | ICD-10-CM | POA: Diagnosis not present

## 2016-03-21 DIAGNOSIS — M50923 Unspecified cervical disc disorder at C6-C7 level: Secondary | ICD-10-CM | POA: Diagnosis not present

## 2016-03-21 DIAGNOSIS — R279 Unspecified lack of coordination: Secondary | ICD-10-CM | POA: Diagnosis not present

## 2016-03-21 DIAGNOSIS — R278 Other lack of coordination: Secondary | ICD-10-CM | POA: Diagnosis not present

## 2016-03-21 DIAGNOSIS — Z5189 Encounter for other specified aftercare: Secondary | ICD-10-CM | POA: Diagnosis not present

## 2016-03-21 DIAGNOSIS — E559 Vitamin D deficiency, unspecified: Secondary | ICD-10-CM | POA: Diagnosis not present

## 2016-03-21 DIAGNOSIS — K5901 Slow transit constipation: Secondary | ICD-10-CM | POA: Diagnosis not present

## 2016-03-21 DIAGNOSIS — M5000 Cervical disc disorder with myelopathy, unspecified cervical region: Secondary | ICD-10-CM | POA: Diagnosis not present

## 2016-03-21 DIAGNOSIS — D72829 Elevated white blood cell count, unspecified: Secondary | ICD-10-CM | POA: Diagnosis not present

## 2016-03-21 DIAGNOSIS — M50922 Unspecified cervical disc disorder at C5-C6 level: Secondary | ICD-10-CM | POA: Diagnosis not present

## 2016-03-21 DIAGNOSIS — R2681 Unsteadiness on feet: Secondary | ICD-10-CM | POA: Diagnosis not present

## 2016-03-21 DIAGNOSIS — I10 Essential (primary) hypertension: Secondary | ICD-10-CM | POA: Diagnosis not present

## 2016-03-21 DIAGNOSIS — Z79899 Other long term (current) drug therapy: Secondary | ICD-10-CM | POA: Diagnosis not present

## 2016-03-21 DIAGNOSIS — M50921 Unspecified cervical disc disorder at C4-C5 level: Secondary | ICD-10-CM | POA: Diagnosis not present

## 2016-03-21 MED ORDER — HYDROCODONE-ACETAMINOPHEN 5-325 MG PO TABS: 1.0000 | ORAL_TABLET | ORAL | Status: DC | PRN

## 2016-03-21 NOTE — Care Management Note (Signed)
Case Management Note  Patient Details  Name: RACHAEL SCHICKLING MRN: [id number] Date of Birth: 06-Feb-1946  Subjective/Objective:                    Action/Plan: Patient discharging to [facility] today. No further needs per CM.   Expected Discharge Date:                  Expected Discharge Plan:  Skilled Nursing Facility  In-House Referral:  Clinical Social Work  Discharge planning Services  CM Consult  Post Acute Care Choice:    Choice offered to:     DME Arranged:    DME Agency:     HH Arranged:    [place] Agency:     Status of Service:  Completed, signed off  Medicare Important Message Given:    Date Medicare IM Given:    Medicare IM give by:    Date Additional Medicare IM Given:    Additional Medicare Important Message give by:     If discussed at [place] of Stay Meetings, dates discussed:    Additional Comments:  Pollie Friar, RN 03/21/2016, 2:03 PM

## 2016-03-21 NOTE — Progress Notes (Signed)
Subjective: Patient reports "I feel pretty good"  Objective: Vital signs in last 24 hours: Temp:  [98.6 F (37 C)-99.3 F (37.4 C)] 98.7 F (37.1 C) (05/08 0613) Pulse Rate:  [66-90] 66 (05/08 0613) Resp:  [18-20] 18 (05/08 0613) BP: (114-142)/(56-86) 117/66 mmHg (05/08 0613) SpO2:  [93 %-100 %] 95 % (05/08 [id number])  Intake/Output from previous day: 05/07 0701 - 05/08 0700 In: -  Out: 800 [Urine:800] Intake/Output this shift:    Alert, conversant. Reports mild soreness between scapulae. MAEW. Some weakness remains dorsiflexion & hip flexors bilaterally, maybe somewhat improved.  BUE sterngth and hand intrinsics near full today. Swallowing improving daily. Incision without erythema, swelling, or drainage. No BM yet, he plans to work on bowels this am.     Lab Results:  Recent Labs  03/18/16 0834  WBC 11.4*  HGB 15.4  HCT 46.2  PLT 212   BMET  Recent Labs  03/18/16 0834  NA 140  K 4.5  CL 99*  CO2 26  GLUCOSE 128*  BUN 11  CREATININE 0.44*  CALCIUM 9.6    Studies/Results: No results found.  Assessment/Plan: Improving   LOS: 3 days  Per DrStern, d/c to SNF [facility]) for rehab. Dr. Vertell Limber will sign FL2 & Rx Norco for prn use. Pt will f/u with DrStern in office in 3-4 weeks.    Verdis Prime 03/21/2016, 7:38 AM

## 2016-03-21 NOTE — Progress Notes (Signed)
Patient is been d/c to a skilled nursing home. Report called to the receiving RN.

## 2016-03-21 NOTE — Clinical Social Work Note (Signed)
Admissions paperwork to be completed between 1-1:30PM.  Clinical Social Worker facilitated patient discharge including contacting patient family and facility to confirm patient discharge plans.  Clinical information faxed to facility and family agreeable with plan.  Patient's daughter to transport via patient via car (facility aware) to [facility].  RN to call report prior to discharge [phone].  Clinical Social Worker will sign off for now as social work intervention is no longer needed. Please consult [place] again if new need arises.  Glendon Axe, MSW, LCSWA [phone] 03/21/2016 12:04 PM

## 2016-03-21 NOTE — Clinical Social Work Placement (Addendum)
   CLINICAL SOCIAL WORK PLACEMENT  NOTE  Date:  03/21/2016  Patient Details  Name: MOHANNAD BERRIGAN MRN: [id number] Date of Birth: [birth year]  Clinical Social Work is seeking post-discharge placement for this patient at the [place] level of care (*CSW will initial, date and re-position this form in  chart as items are completed):  Yes   Patient/family provided with [place] Work Department's list of facilities offering this level of care within the geographic area requested by the patient (or if unable, by the patient's family).  Yes   Patient/family informed of their freedom to choose among providers that offer the needed level of care, that participate in Medicare, Medicaid or managed care program needed by the patient, have an available bed and are willing to accept the patient.  Yes   Patient/family informed of [facility]'s ownership interest in [facility] and [facility], as well as of the fact that they are under no obligation to receive care at these facilities.  PASRR submitted to EDS on       PASRR number received on       Existing PASRR number confirmed on 03/21/16     FL2 transmitted to all facilities in geographic area requested by pt/family on 03/20/16     FL2 transmitted to all facilities within larger geographic area on       Patient informed that his/her managed care company has contracts with or will negotiate with certain facilities, including the following:        Yes   Patient/family informed of bed offers received.  Patient chooses bed at [facility]     Physician recommends and patient chooses bed at      Patient to be transferred to [facility] on 03/21/16.  Patient to be transferred to facility by [place]      Patient family notified on 03/21/16 of transfer.  Name of family member notified:   (Pt's dtr, [name] )     PHYSICIAN Please sign FL2, Please prepare priority discharge summary, including  medications     Additional Comment:    _______________________________________________ Rozell Searing, LCSW 03/21/2016, 11:28 AM

## 2016-03-21 NOTE — Clinical Social Work Note (Signed)
[place] MUST PASARR confirmed [id number].  Glendon Axe, MSW, LCSWA [phone] 03/21/2016 11:26 AM

## 2016-03-21 NOTE — Discharge Summary (Signed)
Physician Discharge Summary  Patient ID: Ethan Rose MRN: [id number] DOB/AGE: 1946/10/22 70 y.o.  Admit date: 03/18/2016 Discharge date: 03/21/2016  Admission Diagnoses: Cervical stenosis, Cervical disc disease with myelopathy, cord compression, cervical kyphosis, herniated cervical discs C 45, C 56, C 67 levels   Discharge Diagnoses: Cervical stenosis, Cervical disc disease with myelopathy, cord compression, cervical kyphosis, herniated cervical discs C 45, C 56, C 67 levels s/p Cervical four-five Cervical five-six Cervical six-seven Anterior cervical decompression/diskectomy/fusion (N/A) - n with PEEK cages, autograft, allograft, plate  Active Problems:   Cervical myelopathy [facility])   Discharged Condition: good  Hospital Course: Ethan Rose was admitted for surgery with dx cervical myelopathy.  Following uncomplicated ACDF [id number], 0000000, C6-7, he recovered well and transferred to [facility] for nursing care and therapies. He has progressed nicely.   Consults: None  Significant Diagnostic Studies: radiology: X-Ray: intra-op  Treatments: surgery: Cervical four-five Cervical five-six Cervical six-seven Anterior cervical decompression/diskectomy/fusion (N/A) - n with PEEK cages, autograft, allograft, plate   Discharge Exam: Blood pressure 117/66, pulse 66, temperature 98.7 F (37.1 C), temperature source Oral, resp. rate 18, height 5\' 9"  (1.753 m), weight 71.668 kg (158 lb), SpO2 95 %. Alert, conversant. Reports mild soreness between scapulae. MAEW. Some weakness remains dorsiflexion & hip flexors bilaterally, maybe somewhat improved.  BUE sterngth and hand intrinsics near full today. Swallowing improving daily. Incision without erythema, swelling, or drainage. No BM yet, he plans to work on bowels this am.      Disposition: 03-Skilled [place] Discharge to SNF [facility]) for rehab. Ok to shower/wash incision with warm soapy water & rinse. Keep dry at other times.  Norco for prn use.  Pt will f/u with DrStern in office in 3-4 weeks.       Medication List    ASK your doctor about these medications        Cholecalciferol 5000 units capsule  Take 5,000 Units by mouth daily.     loratadine 10 MG tablet  Commonly known as:  CLARITIN  Take 10 mg by mouth daily as needed for allergies.     multivitamin with minerals tablet  Take 1 tablet by mouth daily.     NON FORMULARY  Take 1 tablet by mouth 2 (two) times daily. OTC Medication-CardioCover     OVER THE COUNTER MEDICATION  Take 2 capsules by mouth every morning. Nitronol     OVER THE COUNTER MEDICATION  Take 1 capsule by mouth daily. Neuroquil     OVER THE COUNTER MEDICATION  Take 1 capsule by mouth 2 (two) times daily. Nerve Renu     OVER THE COUNTER MEDICATION  Take 1 tablet by mouth daily. Vitamin K2     PAPAYA AND ENZYMES PO  Take 3 tablets by mouth 2 (two) times daily as needed (Indigestion).     PROBIOTIC DAILY PO  Take 1 capsule by mouth 3 (three) times daily with meals.     Turmeric Curcumin 500 MG Caps  Take 1 capsule by mouth daily.         Signed: Verdis Prime 03/21/2016, 7:44 AM

## 2016-03-21 NOTE — Progress Notes (Signed)
Physical Therapy Treatment Patient Details Name: Ethan Rose MRN: [id number] DOB: [birth year] Today's Date: 03/21/2016    History of Present Illness Patient is a 70 y/o male admitted with U/LE weakness previous h/o T10-iliac fusion last year.  Found to have with cervical myelopathy so now s/p C4-5, C5-6, C6-7 ACDF.    PT Comments    Pt progressing well towards all goals. Pt remains appropriate for SNF upon d/c to achieve safe mod I level of function.  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    Mobility  Bed Mobility               General bed mobility comments: pt up in chair  Transfers Overall transfer level: Needs assistance Equipment used: 4-wheeled walker Transfers: Sit to/from Stand Sit to Stand: Min guard         General transfer comment: pt with good technique  Ambulation/Gait Ambulation/Gait assistance: Supervision Ambulation Distance (Feet): 25 Feet (x1, 50'x1) Assistive device: 4-wheeled walker Gait Pattern/deviations: Step-to pattern;Decreased stride length;Steppage Gait velocity: dec Gait velocity interpretation: Below normal speed for age/gender General Gait Details: lowered walker for optimal height, pt with increased UE WBing due to bilat LE steppage, encourage pt to use AFOs as much as possible to decreased UE wbing and stress on neck   Stairs            Wheelchair Mobility    Modified Rankin (Stroke Patients Only)        Balance                                    Cognition Arousal/Alertness: Awake/alert Behavior During Therapy: WFL for tasks assessed/performed Overall Cognitive Status: Within Functional Limits for tasks assessed                      Exercises      General Comments General comments (skin integrity, edema, etc.): discussed transfers in/out of care, spoke extensively about importance about not reaching above head or pulling self up in bed via reaching for head board, educated on sidelying and pushing self up with LEs      Pertinent Vitals/Pain Pain Assessment: 0-10 Pain Score: 3  Pain Location: neck Pain Intervention(s): Monitored during session    Home Living                      Prior Function            PT Goals (current goals can now be found in the care plan section) Acute Rehab PT Goals Patient Stated Goal: to get stronger Progress towards PT goals: Progressing toward goals    Frequency  Min 5X/week    PT Plan Current plan remains appropriate    Co-evaluation             End of Session Equipment Utilized During Treatment: Gait belt;Cervical collar Activity Tolerance: Patient tolerated treatment well Patient left: in chair;with call bell/phone within reach;with family/visitor present  Time: 1137-1205 PT Time Calculation (min) (ACUTE ONLY): 28 min  Charges:  $Gait Training: 8-22 mins $Therapeutic Activity: 8-22 mins                    G Codes:      Kingsley Callander 03/21/2016, 12:11 PM   Kittie Plater, PT, DPT Pager #: [phone] Office #: [phone]

## 2016-03-22 ENCOUNTER — Non-Acute Institutional Stay (SKILLED_NURSING_FACILITY): Payer: Medicare Other | Admitting: Adult Health

## 2016-03-22 ENCOUNTER — Encounter: Payer: Self-pay | Admitting: Adult Health

## 2016-03-22 DIAGNOSIS — G4700 Insomnia, unspecified: Secondary | ICD-10-CM

## 2016-03-22 DIAGNOSIS — J309 Allergic rhinitis, unspecified: Secondary | ICD-10-CM | POA: Diagnosis not present

## 2016-03-22 DIAGNOSIS — D72829 Elevated white blood cell count, unspecified: Secondary | ICD-10-CM | POA: Diagnosis not present

## 2016-03-22 DIAGNOSIS — G959 Disease of spinal cord, unspecified: Secondary | ICD-10-CM | POA: Diagnosis not present

## 2016-03-22 DIAGNOSIS — K5901 Slow transit constipation: Secondary | ICD-10-CM

## 2016-03-22 DIAGNOSIS — E559 Vitamin D deficiency, unspecified: Secondary | ICD-10-CM

## 2016-03-22 DIAGNOSIS — R5381 Other malaise: Secondary | ICD-10-CM

## 2016-03-22 NOTE — Progress Notes (Signed)
Patient ID: Ethan Rose, male   DOB: 02-17-1946, 70 y.o.   MRN: [id number]    DATE:  03/22/2016   MRN:  [id number]  BIRTHDAY: 10-Jun-1946  Facility:  Nursing Home Location:  [place] Room Number: 102-P  LEVEL OF CARE:  SNF (31)  Contact Information    Name Relation Home Work [place] Daughter   [phone]   Donna Bernard   [phone]       Code Status History    Date Active Date Inactive Code Status Order ID Comments User Context   03/27/2015  4:20 PM 04/01/2015  6:30 PM Full Code [id number]  [name], MD Inpatient   03/24/2015  3:07 PM 03/27/2015  4:20 PM Full Code [id number]  [name], MD Inpatient       Chief Complaint  Patient presents with  . Hospitalization Follow-up    HISTORY OF PRESENT ILLNESS:  This is a 70 year old male who has been admitted to [facility] on 03/21/16 from [facility]. 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 He has cervical myelopathy for which he had C4-5, C5-6, C6-7 anterior cervical decompression/diskectomy/fusion with PEEK cages, autograft, allograft, plate. He has been admitted for a short-term rehabilitation.  PAST MEDICAL HISTORY:  Past Medical History  Diagnosis Date  . Kidney calculi     Multiple passed in the past.   . GERD (gastroesophageal reflux disease)   . Hay fever   . 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 Chronic low back pain   . Prediabetes 2015    HbA1c 6.4%: pt saw nutritionist  . Neuromuscular disorder ([place])     spondylolisthesis - lumbar region  . Arthritis     carpal tunnel, L hand gout, arthritis - spine   . Cancer ([place])     squamous cell- R thigh- 01/2015     CURRENT MEDICATIONS: Reviewed  Patient's Medications  New Prescriptions   No medications on file  Previous Medications   CHOLECALCIFEROL 5000 UNITS CAPSULE    Take 5,000 Units by mouth  daily.   DOCUSATE SODIUM (COLACE) 100 MG CAPSULE    Take 100 mg by mouth 2 (two) times daily.   HYDROCODONE-ACETAMINOPHEN (NORCO/VICODIN) 5-325 MG TABLET    Take 1-2 tablets by mouth every 4 (four) hours as needed for moderate pain or severe pain (mild pain).   LORATADINE (CLARITIN) 10 MG TABLET    Take 10 mg by mouth daily as needed for allergies.   METHOCARBAMOL (ROBAXIN) 500 MG TABLET    Take 500 mg by mouth every 6 (six) hours as needed for muscle spasms.   MULTIPLE VITAMINS-MINERALS (MULTIVITAMIN WITH MINERALS) TABLET    Take 1 tablet by mouth daily.   SACCHAROMYCES BOULARDII (FLORASTOR) 250 MG CAPSULE    Take 250 mg by mouth 2 (two) times daily.   TURMERIC CURCUMIN 500 MG CAPS    Take 1 capsule by mouth daily.   ZOLPIDEM (AMBIEN) 5 MG TABLET    Take 5 mg by mouth at bedtime as needed for sleep.  Modified Medications   No medications on file  Discontinued Medications   DIGESTIVE ENZYMES (PAPAYA AND ENZYMES PO)    Take 3 tablets by mouth 2 (two) times daily as needed (Indigestion).    NON FORMULARY    Take 1 tablet by mouth 2 (two) times daily. OTC Medication-CardioCover   OVER THE COUNTER MEDICATION  Take 1 tablet by mouth daily. Vitamin K2   OVER THE COUNTER MEDICATION    Take 2 capsules by mouth every morning. Nitronol   OVER THE COUNTER MEDICATION    Take 1 capsule by mouth daily. Neuroquil   OVER THE COUNTER MEDICATION    Take 1 capsule by mouth 2 (two) times daily. Nerve Renu   PROBIOTIC PRODUCT (PROBIOTIC DAILY PO)    Take 1 capsule by mouth 3 (three) times daily with meals.      No Known Allergies   REVIEW OF SYSTEMS:  GENERAL: no change in appetite, no fatigue, no weight changes, no fever, chills or weakness EYES: Denies change in vision, dry eyes, eye pain, itching or discharge EARS: Denies change in hearing, ringing in ears, or earache NOSE: Denies nasal congestion or epistaxis MOUTH and THROAT: Denies oral discomfort, gingival pain or bleeding, pain from teeth or  hoarseness   RESPIRATORY: no cough, SOB, DOE, wheezing, hemoptysis CARDIAC: no chest pain, edema or palpitations GI: no abdominal pain, diarrhea, constipation, heart burn, nausea or vomiting GU: Denies dysuria, frequency, hematuria, incontinence, or discharge PSYCHIATRIC: Denies feeling of depression or anxiety. No report of hallucinations, insomnia, paranoia, or agitation   PHYSICAL EXAMINATION  GENERAL APPEARANCE: Well nourished. In no acute distress. Normal body habitus SKIN:  Left frontal neck surgical incision is dry, no erythema HEAD: Normal in size and contour. No evidence of trauma EYES: Lids open and close normally. No blepharitis, entropion or ectropion. PERRL. Conjunctivae are clear and sclerae are white. Lenses are without opacity EARS: Pinnae are normal. Patient hears normal voice tunes of the examiner MOUTH and THROAT: Lips are without lesions. Oral mucosa is moist and without lesions. Tongue is normal in shape, size, and color and without lesions NECK: wears hard aspen collar LYMPHATICS: no LAN in the neck, no supraclavicular LAN RESPIRATORY: breathing is even & unlabored, BS CTAB CARDIAC: RRR, no murmur,no extra heart sounds, no edema GI: abdomen soft, normal BS, no masses, no tenderness, no hepatomegaly, no splenomegaly EXTREMITIES:  Able to move X 4 extremities PSYCHIATRIC: Alert and oriented X 3. Affect and behavior are appropriate  LABS/RADIOLOGY: Labs reviewed: Basic Metabolic Panel:  Recent Labs  03/27/15 0842 03/27/15 1440 03/18/16 0834  NA 135 135 140  K 4.0 4.6 4.5  CL  --   --  99*  CO2  --   --  26  GLUCOSE  --  149* 128*  BUN  --   --  11  CREATININE  --   --  0.44*  CALCIUM  --   --  9.6   CBC:  Recent Labs  03/27/15 0842 03/27/15 1440 03/18/16 0834  WBC  --   --  11.4*  HGB 11.9* 8.8* 15.4  HCT 35.0* 26.0* 46.2  MCV  --   --  90.4  PLT  --   --  212   CBG:  Recent Labs  03/24/15 0558 03/24/15 1304 03/27/15 1513  GLUCAP 128*  155* 137*    Dg Cervical Spine 2-3 Views  03/18/2016  CLINICAL DATA:  Intraoperative portable lateral views of the cervical spine EXAM: CERVICAL SPINE - 2-3 VIEW COMPARISON:  Cervical spine series of Mar 16, 2016 FINDINGS: The image timed at 1141 hours reveals metallic marker devices aligned at the superior margin of C4 and superior margin of C5. The trachea is intubated. The image timed at 1308 hours reveals the patient to of undergone ACDF and intradiscal device placement at C4-5 and C5-6. The metallic  hardware appears to be appropriately positioned. Surgical sponge material lies in the anterior aspect of the wound. The trachea is intubated. IMPRESSION: Intraoperative lateral views of the cervical spine with findings as described above. Electronically Signed   By: Demetris  Martinique M.D.   On: 03/18/2016 13:39    ASSESSMENT/PLAN:  Physical deconditioning  - for rehabilitation  Cervical myelopathy S/P decompression/discectomy/fusion - continue Norco 5/325 mg 1-2 tabs by mouth every 4 hours when necessary pain; Robaxin 500 mg 1 tab by mouth every 6 hours when necessary for muscle spasm; follow-up with Dr. Vertell Limber, neurosurgeon, in 3-4 weeks; check BMP  Allergic rhinitis - continue Claritin 10 mg 1 tab by mouth daily when necessary  Insomnia - continue Ambien 5 mg 1 tab by mouth daily at bedtime when necessary  Vitamin D deficiency - continue vitamin D3 5000 units 1 capsule by mouth daily  Constipation - continue Colace 100 mg 1 capsule by mouth twice a day    Leukocytosis  - WBC 15.4; recheck CBC     Goals of care:  Short-term rehabilitation     Durenda Age, NP [place] [phone]

## 2016-03-23 LAB — CBC AND DIFFERENTIAL
HCT: 44 % (ref 41–53)
HEMOGLOBIN: 14.2 g/dL (ref 13.5–17.5)
NEUTROS ABS: 5 /uL
PLATELETS: 272 10*3/uL (ref 150–399)
WBC: 8.4 10^3/mL

## 2016-03-23 LAB — BASIC METABOLIC PANEL
BUN: 14 mg/dL (ref 4–21)
CREATININE: 0.6 mg/dL (ref 0.6–1.3)
Glucose: 137 mg/dL
Potassium: 4.4 mmol/L (ref 3.4–5.3)
SODIUM: 141 mmol/L (ref 137–147)

## 2016-03-25 ENCOUNTER — Non-Acute Institutional Stay (SKILLED_NURSING_FACILITY): Payer: Medicare Other | Admitting: Internal Medicine

## 2016-03-25 ENCOUNTER — Encounter: Payer: Self-pay | Admitting: Internal Medicine

## 2016-03-25 DIAGNOSIS — G959 Disease of spinal cord, unspecified: Secondary | ICD-10-CM

## 2016-03-25 DIAGNOSIS — R5381 Other malaise: Secondary | ICD-10-CM

## 2016-03-25 NOTE — Progress Notes (Signed)
Patient ID: Ethan Rose, male   DOB: 1945-11-25, 70 y.o.   MRN: [id number]   History and Physical     Location:  [facility] Room Number: [place] of Service:  SNF (31)  PCP: Estill Dooms, MD Patient Care Team: Estill Dooms, MD as PCP - General (Internal Medicine) Jessy Oto, MD as Consulting Physician (Orthopedic Surgery) [name], MD as Consulting Physician (Neurosurgery)  Extended Emergency Contact Information Primary Emergency Contact: [facility] Address: [address] [place], [place] [address] Johnnette Litter of [place] Phone: [phone] Relation: Daughter Secondary Emergency Contact: [facility] Address: [address]          [place], [place] [address] Johnnette Litter of [place] Mobile Phone: [phone] Relation: Other  Code Status: full Goals of Care: Advanced Directive information Advanced Directives 03/25/2016  Does patient have an advance directive? No  Does patient want to make changes to advanced directive? -  Would patient like information on creating an advanced directive? -      Chief Complaint  Patient presents with  . New Admit To SNF    following hospitalization Cervical stenosis, Cervical disc disease with myelopathy, cord compression, cervical kyphosis, herniated cervical discs C 45, C 56, C 67 levels s/p Cervical four-five Cervical five-six Cervical six-seven Anterior cervical decompression/diskectomy/fusion (N/A) - n with PEEK cages, autograft, allograft, plate Dr. Vertell Limber    HPI: Patient is a 70 y.o. male seen 03/25/16 for admission  On 03/21/16 to [facility] SNF following hospitalization from 03/18/16 to 03/21/16. He had surgery by Dr. Lenna Sciara., Vertell Limber: Cervical stenosis, Cervical disc disease with myelopathy, cord compression, cervical kyphosis, herniated cervical discs C 45, C 56, C 67 levels s/p Cervical four-five Cervical five-six Cervical six-seven Anterior cervical  decompression/diskectomy/fusion (N/A) - n with PEEK cages, autograft, allograft, plate.   He progressed well during his brief hospitalization. He is now admitted for STR at the SNF to gain strength, impove safe mobility, and improve self care skills.  Past Medical History  Diagnosis Date  . Kidney calculi     Multiple passed in the past.   . GERD (gastroesophageal reflux disease)   . Hay fever   . 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 Chronic low back pain   . Prediabetes 2015    HbA1c 6.4%: pt saw nutritionist  . Neuromuscular disorder ([place])     spondylolisthesis - lumbar region  . Arthritis     carpal tunnel, L hand gout, arthritis - spine   . 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 Lithotripsy      Multiple; most recent was spring 2015  . Tonsillectomy  1954  . Colonoscopy  approx 2011    Dr. [name] (normal per pt report)  . Hernia repair  1993    Bilateral inguinal (urologist did these procedures)  . Anterior lat lumbar fusion Right 03/24/2015    Procedure: Right Lumbar two-Three,Lumbar Three-Four,Lumbar Four-Five Anterior lateral lumbar interbody fusion ;  Surgeon: [name], MD;  Location: [place] NEURO ORS;  Service: Neurosurgery;  Laterality: Right;  right  . Abdominal exposure N/A 03/24/2015    Procedure: ABDOMINAL EXPOSURE;  Surgeon: [name], MD;  Location: [facility] NEURO ORS;  Service: Vascular;  Laterality: N/A;  left side approach  . Posterior lumbar fusion 4 level N/A 03/27/2015    Procedure:  Posterior pedicle subtraction osteotomy with T10 to Iliac fusion with Dr. [name] assisting;  Surgeon: [name], MD;  Location: [facility] NEURO ORS;  Service: Neurosurgery;  Laterality: N/A;  Posterior pedicle subtraction osteotomy with T10 to Iliac fusion with Dr. [name] assisting  . Anterior cervical decomp/discectomy fusion N/A 03/18/2016    Procedure: Cervical four-five Cervical five-six  Cervical six-seven Anterior cervical decompression/diskectomy/fusion;  Surgeon: [name], MD;  Location: [place] NEURO ORS;  Service: Neurosurgery;  Laterality: N/A;  n    reports that he quit smoking about 2 years ago. His smoking use included Cigarettes. He smoked 0.50 packs per day. He has quit using smokeless tobacco. His smokeless tobacco use included Chew. He reports that he drinks about 4.8 oz of alcohol per week. He reports that he does not use illicit drugs. Social History   Social History  . Marital Status: Widowed    Spouse Name: N/A  . Number of Children: N/A  . Years of Education: N/A   Occupational History  . Not on file.   Social History Main Topics  . Smoking status: Former Smoker -- 0.50 packs/day    Types: Cigarettes    Quit date: 03/03/2014  . Smokeless tobacco: Former Systems developer    Types: Chew  . Alcohol Use: 4.8 oz/week    8 Cans of beer per week     Comment: 1-2 beers q day   . Drug Use: No  . Sexual Activity: No   Other Topics Concern  . Not on file   Social History Narrative   Widower as of 36 (wife [name] d. MI), has one daughter and one grandson.   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   Orig from [place].  Has lived in [place] x 25 yrs.   Tobacco 50 pack-yr hx--Quit 02/2014.     Alcohol: 2 beers per day avg.  No drugs.   Exercise: none.  Has active job/physical labor.   Admitted to [facility] 03/2016   FULL CODE    Functional Status Survey:    Family History  Problem Relation Age of Onset  . Stroke Mother   . Diabetes Mother   . Heart disease Mother   . Hypertension Mother   . Heart attack Mother   . Stroke Father   . Heart disease Father   . Diabetes Brother   . Hypertension Brother     Health Maintenance  Topic Date Due  . Hepatitis C Screening  03-01-1946  . INFLUENZA VACCINE  06/14/2016  . COLONOSCOPY  11/17/2019  . TETANUS/TDAP  01/16/2022  . ZOSTAVAX  Completed  . PNA vac Low Risk Adult  Completed    No Known Allergies      Medication List       This list is accurate as of: 03/25/16  2:25 PM.  Always use your most recent med list.               Cholecalciferol 5000 units capsule  Take 5,000 Units by mouth daily.     docusate sodium 100 MG capsule  Commonly known as:  COLACE  Take 100 mg by mouth 2 (two) times daily.     HYDROcodone-acetaminophen 5-325 MG tablet  Commonly known as:  NORCO/VICODIN  Take 1-2 tablets by mouth every 4 (four) hours as needed for moderate pain or severe pain (mild pain).     loratadine 10 MG tablet  Commonly known as:  CLARITIN  Take 10 mg by mouth daily as needed for allergies.  methocarbamol 500 MG tablet  Commonly known as:  ROBAXIN  Take 500 mg by mouth every 6 (six) hours as needed for muscle spasms.     multivitamin with minerals tablet  Take 1 tablet by mouth daily.     saccharomyces boulardii 250 MG capsule  Commonly known as:  FLORASTOR  Take 250 mg by mouth 2 (two) times daily.     Turmeric Curcumin 500 MG Caps  Take 1 capsule by mouth daily.     zolpidem 5 MG tablet  Commonly known as:  AMBIEN  Take 5 mg by mouth at bedtime as needed for sleep.        Review of Systems  Constitutional: Negative for activity change, appetite change, fatigue, fever and unexpected weight change.       Generalized weakness.  HENT: Negative for congestion, ear pain, hearing loss, rhinorrhea, sore throat, tinnitus, trouble swallowing and voice change.   Eyes:       Corrective lenses  Respiratory: Negative for cough, choking, chest tightness, shortness of breath and wheezing.   Cardiovascular: Negative for chest pain, palpitations and leg swelling.  Gastrointestinal: Negative for abdominal distention, abdominal pain, constipation, diarrhea and nausea.  Endocrine: Negative for cold intolerance, heat intolerance, polydipsia, polyphagia and polyuria.  Genitourinary: Negative for dysuria, frequency, testicular pain and urgency.       Not incontinent   Musculoskeletal: Negative for arthralgias, back pain, gait problem, myalgias and neck pain.       Recent neck surgery.  Skin: Negative for color change, pallor and rash.  Allergic/Immunologic: Negative.   Neurological: Negative for dizziness, tremors, syncope, speech difficulty, weakness, numbness and headaches.  Hematological: Negative for adenopathy. Does not bruise/bleed easily.  Psychiatric/Behavioral: Negative for behavioral problems, confusion, decreased concentration, hallucinations and sleep disturbance. The patient is not nervous/anxious.     Filed Vitals:   03/25/16 1418  BP: 123/75  Pulse: 74  Temp: 98.3 F (36.8 C)  Resp: 20  Height: 5\' 8"  (1.727 m)  Weight: 156 lb (70.761 kg)  SpO2: 97%   Body mass index is 23.73 kg/(m^2). Physical Exam  Constitutional: He is oriented to person, place, and time. He appears well-developed and well-nourished. No distress.  HENT:  Right Ear: External ear normal.  Left Ear: External ear normal.  Nose: Nose normal.  Mouth/Throat: Oropharynx is clear and moist. No oropharyngeal exudate.  Eyes: Conjunctivae and EOM are normal. Pupils are equal, round, and reactive to light.  Neck: No JVD present. No tracheal deviation present. No thyromegaly present.  Cardiovascular: Normal rate, regular rhythm, normal heart sounds and intact distal pulses.  Exam reveals no gallop and no friction rub.   No murmur heard. Pulmonary/Chest: No respiratory distress. He has no wheezes. He has no rales. He exhibits no tenderness.  Abdominal: He exhibits no distension and no mass. There is no tenderness.  Musculoskeletal: Normal range of motion. He exhibits no edema or tenderness.  Lymphadenopathy:    He has no cervical adenopathy.  Neurological: He is alert and oriented to person, place, and time. He has normal reflexes. No cranial nerve deficit. Coordination normal.  Skin: No rash noted. No erythema. No pallor.  surgical incisions at the neck are healing.   Psychiatric: He has a normal mood and affect. His behavior is normal. Judgment and thought content normal.    Labs reviewed: Basic Metabolic Panel:  Recent Labs  03/27/15 0842 03/27/15 1440 03/18/16 0834  NA 135 135 140  K 4.0 4.6 4.5  CL  --   --  99*  CO2  --   --  26  GLUCOSE  --  149* 128*  BUN  --   --  11  CREATININE  --   --  0.44*  CALCIUM  --   --  9.6   Liver Function Tests: No results for input(s): AST, ALT, ALKPHOS, BILITOT, PROT, ALBUMIN in the last 8760 hours. No results for input(s): LIPASE, AMYLASE in the last 8760 hours. No results for input(s): AMMONIA in the last 8760 hours. CBC:  Recent Labs  03/27/15 0842 03/27/15 1440 03/18/16 0834  WBC  --   --  11.4*  HGB 11.9* 8.8* 15.4  HCT 35.0* 26.0* 46.2  MCV  --   --  90.4  PLT  --   --  212   Cardiac Enzymes: No results for input(s): CKTOTAL, CKMB, CKMBINDEX, TROPONINI in the last 8760 hours. BNP: Invalid input(s): POCBNP Lab Results  Component Value Date   HGBA1C 6.4* 12/30/2014   Lab Results  Component Value Date   TSH 1.52 04/16/2014   No results found for: VITAMINB12 No results found for: FOLATE No results found for: IRON, TIBC, FERRITIN  Imaging and Procedures obtained prior to SNF admission: Dg Cervical Spine 2-3 Views  03/18/2016  CLINICAL DATA:  Intraoperative portable lateral views of the cervical spine EXAM: CERVICAL SPINE - 2-3 VIEW COMPARISON:  Cervical spine series of Mar 16, 2016 FINDINGS: The image timed at 1141 hours reveals metallic marker devices aligned at the superior margin of C4 and superior margin of C5. The trachea is intubated. The image timed at 1308 hours reveals the patient to of undergone ACDF and intradiscal device placement at C4-5 and C5-6. The metallic hardware appears to be appropriately positioned. Surgical sponge material lies in the anterior aspect of the wound. The trachea is intubated. IMPRESSION: Intraoperative lateral views of the cervical spine with  findings as described above. Electronically Signed   By: Archie  Martinique M.D.   On: 03/18/2016 13:39    Assessment/Plan 1. Debility -PT and OT for strengthening, training in safe mobillty, and improvement in self care skills.  2. Cervical myelopathy (HCC) status post surgery to the cervical spin with multilevel decompressions.

## 2016-03-28 ENCOUNTER — Encounter: Payer: Self-pay | Admitting: Adult Health

## 2016-03-28 ENCOUNTER — Non-Acute Institutional Stay (SKILLED_NURSING_FACILITY): Payer: Medicare Other | Admitting: Adult Health

## 2016-03-28 DIAGNOSIS — I10 Essential (primary) hypertension: Secondary | ICD-10-CM | POA: Diagnosis not present

## 2016-03-28 NOTE — Progress Notes (Signed)
Patient ID: Ethan Rose, male   DOB: [birth year], 70 y.o.   MRN: [id number]    DATE:  03/28/16  MRN:  [id number]  BIRTHDAY: 18-Mar-1946  Facility:  Nursing Home Location:  [facility] Room Number: 102-P  LEVEL OF CARE:  SNF (31)  Contact Information    Name Relation Home Work [place] Daughter   [phone]   Donna Bernard   [phone]       Code Status History    Date Active Date Inactive Code Status Order ID Comments User Context   03/27/2015  4:20 PM 04/01/2015  6:30 PM Full Code [id number]  [name], MD Inpatient   03/24/2015  3:07 PM 03/27/2015  4:20 PM Full Code [id number]  [name], MD Inpatient       Chief Complaint  Patient presents with  . Acute Visit    HTN    HISTORY OF PRESENT ILLNESS:  This is a 70 year old male who has been noted to have BP 178/82. No complaints of headache nor dizziness. BP has been checked manually and is still elevated.  He has been admitted to [facility] on 03/21/16 from [facility]. 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 He has cervical myelopathy for which he had C4-5, C5-6, C6-7 anterior cervical decompression/diskectomy/fusion with PEEK cages, autograft, allograft, plate. He has been admitted for a short-term rehabilitation.  PAST MEDICAL HISTORY:  Past Medical History  Diagnosis Date  . Kidney calculi     Multiple passed in the past.   . GERD (gastroesophageal reflux disease)   . Hay fever   . 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 Chronic low back pain   . Prediabetes 2015    HbA1c 6.4%: pt saw nutritionist  . Neuromuscular disorder ([place])     spondylolisthesis - lumbar region  . Arthritis     carpal tunnel, L hand gout, arthritis - spine   . 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 Physical deconditioning   . Cervical myelopathy ([place])   . Allergic  rhinitis   . Slow transit constipation   . Vitamin D deficiency   . Leukocytosis      CURRENT MEDICATIONS: Reviewed  Patient's Medications  New Prescriptions   No medications on file  Previous Medications   CHOLECALCIFEROL 5000 UNITS CAPSULE    Take 5,000 Units by mouth daily.   CLONIDINE (CATAPRES) 0.1 MG TABLET    Take 0.1 mg by mouth once. For  BP 178/82   DOCUSATE SODIUM (COLACE) 100 MG CAPSULE    Take 100 mg by mouth 2 (two) times daily.   HYDROCODONE-ACETAMINOPHEN (NORCO/VICODIN) 5-325 MG TABLET    Take 1-2 tablets by mouth every 4 (four) hours as needed for moderate pain or severe pain (mild pain).   LORATADINE (CLARITIN) 10 MG TABLET    Take 10 mg by mouth daily as needed for allergies.   METHOCARBAMOL (ROBAXIN) 500 MG TABLET    Take 500 mg by mouth every 6 (six) hours as needed for muscle spasms.   MULTIPLE VITAMINS-MINERALS (MULTIVITAMIN WITH MINERALS) TABLET    Take 1 tablet by mouth daily.   SACCHAROMYCES BOULARDII (FLORASTOR) 250 MG CAPSULE    Take 250 mg by mouth 2 (two) times daily.   TURMERIC CURCUMIN 500 MG CAPS    Take 1 capsule by mouth daily.   ZOLPIDEM (  AMBIEN) 5 MG TABLET    Take 5 mg by mouth at bedtime as needed for sleep.  Modified Medications   No medications on file  Discontinued Medications   No medications on file     No Known Allergies   REVIEW OF SYSTEMS:  GENERAL: no change in appetite, no fatigue, no weight changes, no fever, chills or weakness EYES: Denies change in vision, dry eyes, eye pain, itching or discharge EARS: Denies change in hearing, ringing in ears, or earache NOSE: Denies nasal congestion or epistaxis MOUTH and THROAT: Denies oral discomfort, gingival pain or bleeding, pain from teeth or hoarseness   RESPIRATORY: no cough, SOB, DOE, wheezing, hemoptysis CARDIAC: no chest pain, edema or palpitations GI: no abdominal pain, diarrhea, constipation, heart burn, nausea or vomiting GU: Denies dysuria, frequency, hematuria,  incontinence, or discharge PSYCHIATRIC: Denies feeling of depression or anxiety. No report of hallucinations, insomnia, paranoia, or agitation   PHYSICAL EXAMINATION  GENERAL APPEARANCE: Well nourished. In no acute distress. Normal body habitus SKIN:  Left frontal neck surgical incision is dry, no erythema HEAD: Normal in size and contour. No evidence of trauma EYES: Lids open and close normally. No blepharitis, entropion or ectropion. PERRL. Conjunctivae are clear and sclerae are white. Lenses are without opacity EARS: Pinnae are normal. Patient hears normal voice tunes of the examiner MOUTH and THROAT: Lips are without lesions. Oral mucosa is moist and without lesions. Tongue is normal in shape, size, and color and without lesions NECK: wears hard aspen collar LYMPHATICS: no LAN in the neck, no supraclavicular LAN RESPIRATORY: breathing is even & unlabored, BS CTAB CARDIAC: RRR, no murmur,no extra heart sounds, no edema GI: abdomen soft, normal BS, no masses, no tenderness, no hepatomegaly, no splenomegaly EXTREMITIES:  Able to move X 4 extremities PSYCHIATRIC: Alert and oriented X 3. Affect and behavior are appropriate  LABS/RADIOLOGY: Labs reviewed: Basic Metabolic Panel:  Recent Labs  03/18/16 0834 03/23/16  NA 140 141  K 4.5 4.4  CL 99*  --   CO2 26  --   GLUCOSE 128*  --   BUN 11 14  CREATININE 0.44* 0.6  CALCIUM 9.6  --    CBC:  Recent Labs  03/18/16 0834 03/23/16  WBC 11.4* 8.4  NEUTROABS  --  5  HGB 15.4 14.2  HCT 46.2 44  MCV 90.4  --   PLT 212 272     Dg Cervical Spine 2-3 Views  03/18/2016  CLINICAL DATA:  Intraoperative portable lateral views of the cervical spine EXAM: CERVICAL SPINE - 2-3 VIEW COMPARISON:  Cervical spine series of Mar 16, 2016 FINDINGS: The image timed at 1141 hours reveals metallic marker devices aligned at the superior margin of C4 and superior margin of C5. The trachea is intubated. The image timed at 1308 hours reveals the patient  to of undergone ACDF and intradiscal device placement at C4-5 and C5-6. The metallic hardware appears to be appropriately positioned. Surgical sponge material lies in the anterior aspect of the wound. The trachea is intubated. IMPRESSION: Intraoperative lateral views of the cervical spine with findings as described above. Electronically Signed   By: Leeandre  Martinique M.D.   On: 03/18/2016 13:39    ASSESSMENT/PLAN:  Hypertension - give Clonidine 0.1 mg 1 tab PO X 1; monitor BP    Durenda Age, NP [employer] [phone]

## 2016-04-13 ENCOUNTER — Encounter: Payer: Self-pay | Admitting: Adult Health

## 2016-04-13 ENCOUNTER — Non-Acute Institutional Stay (SKILLED_NURSING_FACILITY): Payer: Medicare Other | Admitting: Adult Health

## 2016-04-13 DIAGNOSIS — K5901 Slow transit constipation: Secondary | ICD-10-CM

## 2016-04-13 DIAGNOSIS — G959 Disease of spinal cord, unspecified: Secondary | ICD-10-CM

## 2016-04-13 DIAGNOSIS — M4802 Spinal stenosis, cervical region: Secondary | ICD-10-CM | POA: Diagnosis not present

## 2016-04-13 DIAGNOSIS — G4700 Insomnia, unspecified: Secondary | ICD-10-CM | POA: Diagnosis not present

## 2016-04-13 DIAGNOSIS — J309 Allergic rhinitis, unspecified: Secondary | ICD-10-CM

## 2016-04-13 DIAGNOSIS — R030 Elevated blood-pressure reading, without diagnosis of hypertension: Secondary | ICD-10-CM | POA: Diagnosis not present

## 2016-04-13 DIAGNOSIS — E559 Vitamin D deficiency, unspecified: Secondary | ICD-10-CM | POA: Diagnosis not present

## 2016-04-13 DIAGNOSIS — M419 Scoliosis, unspecified: Secondary | ICD-10-CM | POA: Diagnosis not present

## 2016-04-13 DIAGNOSIS — R5381 Other malaise: Secondary | ICD-10-CM

## 2016-04-13 DIAGNOSIS — M5000 Cervical disc disorder with myelopathy, unspecified cervical region: Secondary | ICD-10-CM | POA: Diagnosis not present

## 2016-04-13 NOTE — Progress Notes (Signed)
Patient ID: Ethan Rose, male   DOB: 01/17/1946, 70 y.o.   MRN: [id number]     DATE:  04/13/2016   MRN:  [id number]  BIRTHDAY: 1946-08-31  Facility:  Nursing Home Location:  [place] Room Number: 102-P  LEVEL OF CARE:  SNF (31)  Contact Information    Name Relation Home Work [place] Daughter   [phone]   Donna Bernard   [phone]       Code Status History    Date Active Date Inactive Code Status Order ID Comments User Context   03/27/2015  4:20 PM 04/01/2015  6:30 PM Full Code [id number]  [name], MD Inpatient   03/24/2015  3:07 PM 03/27/2015  4:20 PM Full Code [id number]  [name], MD Inpatient       Chief Complaint  Patient presents with  . Discharge Note    HISTORY OF PRESENT ILLNESS:  This is a 70 year old male who is for discharge home with Home health PT, OT, CNA and Nursing.  He has been admitted to [facility] on 03/21/16 from [facility]. 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 He has cervical myelopathy for which he had C4-5, C5-6, C6-7 anterior cervical decompression/diskectomy/fusion with PEEK cages, autograft, allograft, plate.   Patient was admitted to this facility for short-term rehabilitation after the patient's recent hospitalization.  Patient has completed SNF rehabilitation and therapy has cleared the patient for discharge.  PAST MEDICAL HISTORY:  Past Medical History  Diagnosis Date  . Kidney calculi     Multiple passed in the past.   . GERD (gastroesophageal reflux disease)   . Hay fever   . 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 Chronic low back pain   . Prediabetes 2015    HbA1c 6.4%: pt saw nutritionist  . Neuromuscular disorder ([place])     spondylolisthesis - lumbar region  . Arthritis     carpal tunnel, L hand gout, arthritis - spine   . Cancer ([place])     squamous  cell- R thigh- 01/2015  . Physical deconditioning   . Cervical myelopathy ([place])   . Allergic rhinitis   . Slow transit constipation   . Vitamin D deficiency   . Leukocytosis   . Essential hypertension      CURRENT MEDICATIONS: Reviewed  Patient's Medications  New Prescriptions   No medications on file  Previous Medications   CHOLECALCIFEROL 5000 UNITS CAPSULE    Take 5,000 Units by mouth daily.   DOCUSATE SODIUM (COLACE) 100 MG CAPSULE    Take 100 mg by mouth 2 (two) times daily.   HYDROCODONE-ACETAMINOPHEN (NORCO/VICODIN) 5-325 MG TABLET    Take 1-2 tablets by mouth every 4 (four) hours as needed for moderate pain or severe pain (mild pain).   LORATADINE (CLARITIN) 10 MG TABLET    Take 10 mg by mouth daily as needed for allergies.   METHOCARBAMOL (ROBAXIN) 500 MG TABLET    Take 500 mg by mouth every 6 (six) hours as needed for muscle spasms.   MULTIPLE VITAMINS-MINERALS (MULTIVITAMIN WITH MINERALS) TABLET    Take 1 tablet by mouth daily.   SACCHAROMYCES BOULARDII (FLORASTOR) 250 MG CAPSULE    Take 250 mg by mouth 2 (two) times daily.   TURMERIC CURCUMIN 500 MG CAPS    Take 1 capsule by mouth daily.   ZOLPIDEM (AMBIEN) 5 MG TABLET  Take 5 mg by mouth at bedtime as needed for sleep.  Modified Medications   No medications on file  Discontinued Medications   CLONIDINE (CATAPRES) 0.1 MG TABLET    Take 0.1 mg by mouth once. For  BP 178/82     No Known Allergies   REVIEW OF SYSTEMS:  GENERAL: no change in appetite, no fatigue, no weight changes, no fever, chills or weakness EYES: Denies change in vision, dry eyes, eye pain, itching or discharge EARS: Denies change in hearing, ringing in ears, or earache NOSE: Denies nasal congestion or epistaxis MOUTH and THROAT: Denies oral discomfort, gingival pain or bleeding, pain from teeth or hoarseness   RESPIRATORY: no cough, SOB, DOE, wheezing, hemoptysis CARDIAC: no chest pain, edema or palpitations GI: no abdominal pain, diarrhea,  constipation, heart burn, nausea or vomiting GU: Denies dysuria, frequency, hematuria, incontinence, or discharge PSYCHIATRIC: Denies feeling of depression or anxiety. No report of hallucinations, insomnia, paranoia, or agitation   PHYSICAL EXAMINATION  GENERAL APPEARANCE: Well nourished. In no acute distress. Normal body habitus SKIN:  Left frontal neck surgical incision is dry, no erythema HEAD: Normal in size and contour. No evidence of trauma EYES: Lids open and close normally. No blepharitis, entropion or ectropion. PERRL. Conjunctivae are clear and sclerae are white. Lenses are without opacity EARS: Pinnae are normal. Patient hears normal voice tunes of the examiner MOUTH and THROAT: Lips are without lesions. Oral mucosa is moist and without lesions. Tongue is normal in shape, size, and color and without lesions NECK: wears hard aspen collar LYMPHATICS: no LAN in the neck, no supraclavicular LAN RESPIRATORY: breathing is even & unlabored, BS CTAB CARDIAC: RRR, no murmur,no extra heart sounds, no edema GI: abdomen soft, normal BS, no masses, no tenderness, no hepatomegaly, no splenomegaly EXTREMITIES:  Able to move X 4 extremities PSYCHIATRIC: Alert and oriented X 3. Affect and behavior are appropriate  LABS/RADIOLOGY: Labs reviewed: Basic Metabolic Panel:  Recent Labs  03/18/16 0834 03/23/16  NA 140 141  K 4.5 4.4  CL 99*  --   CO2 26  --   GLUCOSE 128*  --   BUN 11 14  CREATININE 0.44* 0.6  CALCIUM 9.6  --    CBC:  Recent Labs  03/18/16 0834 03/23/16  WBC 11.4* 8.4  NEUTROABS  --  5  HGB 15.4 14.2  HCT 46.2 44  MCV 90.4  --   PLT 212 272   CBG: No results for input(s): GLUCAP in the last 8760 hours.  Dg Cervical Spine 2-3 Views  03/18/2016  CLINICAL DATA:  Intraoperative portable lateral views of the cervical spine EXAM: CERVICAL SPINE - 2-3 VIEW COMPARISON:  Cervical spine series of Mar 16, 2016 FINDINGS: The image timed at 1141 hours reveals metallic marker  devices aligned at the superior margin of C4 and superior margin of C5. The trachea is intubated. The image timed at 1308 hours reveals the patient to of undergone ACDF and intradiscal device placement at C4-5 and C5-6. The metallic hardware appears to be appropriately positioned. Surgical sponge material lies in the anterior aspect of the wound. The trachea is intubated. IMPRESSION: Intraoperative lateral views of the cervical spine with findings as described above. Electronically Signed   By: Leaman  Martinique M.D.   On: 03/18/2016 13:39    ASSESSMENT/PLAN:  Physical deconditioning  - for Home health PT, OT, CNA and Nursing  Cervical myelopathy S/P decompression/discectomy/fusion - continue Norco 5/325 mg 1-2 tabs by mouth every 4 hours when necessary  pain; Robaxin 500 mg 1 tab by mouth every 6 hours when necessary for muscle spasm; follow-up with Dr. Vertell Limber, neurosurgeon  Allergic rhinitis - continue Claritin 10 mg 1 tab by mouth daily when necessary  Insomnia - continue Ambien 5 mg 1 tab by mouth daily at bedtime when necessary  Vitamin D deficiency - continue vitamin D3 5000 units 1 capsule by mouth daily  Constipation - continue Colace 100 mg 1 capsule by mouth twice a day    Leukocytosis  - WBC 15.4; rechecked wbc 8.4, resolved     I have filled out patient's discharge paperwork and written prescriptions.  Patient will receive home health PT, OT, Nursing and CNA.  DME provided:  none  Total discharge time: Less than 30 minutes  Discharge time involved coordination of the discharge process with social worker, nursing staff and therapy department. Medical justification for home health services verified.    Durenda Age, NP [employer] [phone]

## 2016-04-16 DIAGNOSIS — Z4789 Encounter for other orthopedic aftercare: Secondary | ICD-10-CM | POA: Diagnosis not present

## 2016-04-18 DIAGNOSIS — Z4789 Encounter for other orthopedic aftercare: Secondary | ICD-10-CM | POA: Diagnosis not present

## 2016-04-20 DIAGNOSIS — Z4789 Encounter for other orthopedic aftercare: Secondary | ICD-10-CM | POA: Diagnosis not present

## 2016-04-21 DIAGNOSIS — Z4789 Encounter for other orthopedic aftercare: Secondary | ICD-10-CM | POA: Diagnosis not present

## 2016-04-22 DIAGNOSIS — Z4789 Encounter for other orthopedic aftercare: Secondary | ICD-10-CM | POA: Diagnosis not present

## 2016-04-25 DIAGNOSIS — Z4789 Encounter for other orthopedic aftercare: Secondary | ICD-10-CM | POA: Diagnosis not present

## 2016-04-26 DIAGNOSIS — Z4789 Encounter for other orthopedic aftercare: Secondary | ICD-10-CM | POA: Diagnosis not present

## 2016-04-27 DIAGNOSIS — Z4789 Encounter for other orthopedic aftercare: Secondary | ICD-10-CM | POA: Diagnosis not present

## 2016-04-29 DIAGNOSIS — Z4789 Encounter for other orthopedic aftercare: Secondary | ICD-10-CM | POA: Diagnosis not present

## 2016-05-03 DIAGNOSIS — Z4789 Encounter for other orthopedic aftercare: Secondary | ICD-10-CM | POA: Diagnosis not present

## 2016-05-04 DIAGNOSIS — Z4789 Encounter for other orthopedic aftercare: Secondary | ICD-10-CM | POA: Diagnosis not present

## 2016-05-05 DIAGNOSIS — Z4789 Encounter for other orthopedic aftercare: Secondary | ICD-10-CM | POA: Diagnosis not present

## 2016-05-10 DIAGNOSIS — Z4789 Encounter for other orthopedic aftercare: Secondary | ICD-10-CM | POA: Diagnosis not present

## 2016-05-13 DIAGNOSIS — Z4789 Encounter for other orthopedic aftercare: Secondary | ICD-10-CM | POA: Diagnosis not present

## 2016-05-18 DIAGNOSIS — Z4789 Encounter for other orthopedic aftercare: Secondary | ICD-10-CM | POA: Diagnosis not present

## 2016-05-19 DIAGNOSIS — Z4789 Encounter for other orthopedic aftercare: Secondary | ICD-10-CM | POA: Diagnosis not present

## 2016-05-24 DIAGNOSIS — Z4789 Encounter for other orthopedic aftercare: Secondary | ICD-10-CM | POA: Diagnosis not present

## 2016-05-25 DIAGNOSIS — M5000 Cervical disc disorder with myelopathy, unspecified cervical region: Secondary | ICD-10-CM | POA: Diagnosis not present

## 2016-05-25 DIAGNOSIS — G959 Disease of spinal cord, unspecified: Secondary | ICD-10-CM | POA: Diagnosis not present

## 2016-05-25 DIAGNOSIS — M5416 Radiculopathy, lumbar region: Secondary | ICD-10-CM | POA: Diagnosis not present

## 2016-05-25 DIAGNOSIS — M4802 Spinal stenosis, cervical region: Secondary | ICD-10-CM | POA: Diagnosis not present

## 2016-05-25 DIAGNOSIS — R030 Elevated blood-pressure reading, without diagnosis of hypertension: Secondary | ICD-10-CM | POA: Diagnosis not present

## 2016-05-26 DIAGNOSIS — Z4789 Encounter for other orthopedic aftercare: Secondary | ICD-10-CM | POA: Diagnosis not present

## 2016-05-31 DIAGNOSIS — Z4789 Encounter for other orthopedic aftercare: Secondary | ICD-10-CM | POA: Diagnosis not present

## 2016-06-02 DIAGNOSIS — Z4789 Encounter for other orthopedic aftercare: Secondary | ICD-10-CM | POA: Diagnosis not present

## 2016-06-07 DIAGNOSIS — Z4789 Encounter for other orthopedic aftercare: Secondary | ICD-10-CM | POA: Diagnosis not present

## 2016-06-10 DIAGNOSIS — Z4789 Encounter for other orthopedic aftercare: Secondary | ICD-10-CM | POA: Diagnosis not present

## 2016-06-15 DIAGNOSIS — Z4789 Encounter for other orthopedic aftercare: Secondary | ICD-10-CM | POA: Diagnosis not present

## 2016-06-15 DIAGNOSIS — G609 Hereditary and idiopathic neuropathy, unspecified: Secondary | ICD-10-CM | POA: Diagnosis not present

## 2016-06-15 DIAGNOSIS — M6281 Muscle weakness (generalized): Secondary | ICD-10-CM | POA: Diagnosis not present

## 2016-06-15 DIAGNOSIS — R278 Other lack of coordination: Secondary | ICD-10-CM | POA: Diagnosis not present

## 2016-06-16 DIAGNOSIS — M6281 Muscle weakness (generalized): Secondary | ICD-10-CM | POA: Diagnosis not present

## 2016-06-16 DIAGNOSIS — R278 Other lack of coordination: Secondary | ICD-10-CM | POA: Diagnosis not present

## 2016-06-16 DIAGNOSIS — G609 Hereditary and idiopathic neuropathy, unspecified: Secondary | ICD-10-CM | POA: Diagnosis not present

## 2016-06-16 DIAGNOSIS — Z4789 Encounter for other orthopedic aftercare: Secondary | ICD-10-CM | POA: Diagnosis not present

## 2016-06-21 DIAGNOSIS — G609 Hereditary and idiopathic neuropathy, unspecified: Secondary | ICD-10-CM | POA: Diagnosis not present

## 2016-06-21 DIAGNOSIS — Z4789 Encounter for other orthopedic aftercare: Secondary | ICD-10-CM | POA: Diagnosis not present

## 2016-06-21 DIAGNOSIS — M6281 Muscle weakness (generalized): Secondary | ICD-10-CM | POA: Diagnosis not present

## 2016-06-21 DIAGNOSIS — R278 Other lack of coordination: Secondary | ICD-10-CM | POA: Diagnosis not present

## 2016-06-23 DIAGNOSIS — R278 Other lack of coordination: Secondary | ICD-10-CM | POA: Diagnosis not present

## 2016-06-23 DIAGNOSIS — G609 Hereditary and idiopathic neuropathy, unspecified: Secondary | ICD-10-CM | POA: Diagnosis not present

## 2016-06-23 DIAGNOSIS — M6281 Muscle weakness (generalized): Secondary | ICD-10-CM | POA: Diagnosis not present

## 2016-06-23 DIAGNOSIS — Z4789 Encounter for other orthopedic aftercare: Secondary | ICD-10-CM | POA: Diagnosis not present

## 2016-06-28 DIAGNOSIS — Z4789 Encounter for other orthopedic aftercare: Secondary | ICD-10-CM | POA: Diagnosis not present

## 2016-06-28 DIAGNOSIS — G609 Hereditary and idiopathic neuropathy, unspecified: Secondary | ICD-10-CM | POA: Diagnosis not present

## 2016-06-28 DIAGNOSIS — R278 Other lack of coordination: Secondary | ICD-10-CM | POA: Diagnosis not present

## 2016-06-28 DIAGNOSIS — M6281 Muscle weakness (generalized): Secondary | ICD-10-CM | POA: Diagnosis not present

## 2016-06-30 DIAGNOSIS — M6281 Muscle weakness (generalized): Secondary | ICD-10-CM | POA: Diagnosis not present

## 2016-06-30 DIAGNOSIS — G609 Hereditary and idiopathic neuropathy, unspecified: Secondary | ICD-10-CM | POA: Diagnosis not present

## 2016-06-30 DIAGNOSIS — R278 Other lack of coordination: Secondary | ICD-10-CM | POA: Diagnosis not present

## 2016-06-30 DIAGNOSIS — Z4789 Encounter for other orthopedic aftercare: Secondary | ICD-10-CM | POA: Diagnosis not present

## 2016-07-05 DIAGNOSIS — M6281 Muscle weakness (generalized): Secondary | ICD-10-CM | POA: Diagnosis not present

## 2016-07-05 DIAGNOSIS — R278 Other lack of coordination: Secondary | ICD-10-CM | POA: Diagnosis not present

## 2016-07-05 DIAGNOSIS — Z4789 Encounter for other orthopedic aftercare: Secondary | ICD-10-CM | POA: Diagnosis not present

## 2016-07-05 DIAGNOSIS — G609 Hereditary and idiopathic neuropathy, unspecified: Secondary | ICD-10-CM | POA: Diagnosis not present

## 2016-07-06 DIAGNOSIS — M7020 Olecranon bursitis, unspecified elbow: Secondary | ICD-10-CM

## 2016-07-06 DIAGNOSIS — M25522 Pain in left elbow: Secondary | ICD-10-CM | POA: Diagnosis not present

## 2016-07-06 DIAGNOSIS — R030 Elevated blood-pressure reading, without diagnosis of hypertension: Secondary | ICD-10-CM | POA: Diagnosis not present

## 2016-07-06 DIAGNOSIS — M4317 Spondylolisthesis, lumbosacral region: Secondary | ICD-10-CM | POA: Diagnosis not present

## 2016-07-06 DIAGNOSIS — M4120 Other idiopathic scoliosis, site unspecified: Secondary | ICD-10-CM | POA: Diagnosis not present

## 2016-07-06 DIAGNOSIS — M4802 Spinal stenosis, cervical region: Secondary | ICD-10-CM | POA: Diagnosis not present

## 2016-07-06 DIAGNOSIS — M5000 Cervical disc disorder with myelopathy, unspecified cervical region: Secondary | ICD-10-CM | POA: Diagnosis not present

## 2016-07-06 HISTORY — DX: Olecranon bursitis, unspecified elbow: M70.20

## 2016-07-08 DIAGNOSIS — R278 Other lack of coordination: Secondary | ICD-10-CM | POA: Diagnosis not present

## 2016-07-08 DIAGNOSIS — M6281 Muscle weakness (generalized): Secondary | ICD-10-CM | POA: Diagnosis not present

## 2016-07-08 DIAGNOSIS — G609 Hereditary and idiopathic neuropathy, unspecified: Secondary | ICD-10-CM | POA: Diagnosis not present

## 2016-07-08 DIAGNOSIS — Z4789 Encounter for other orthopedic aftercare: Secondary | ICD-10-CM | POA: Diagnosis not present

## 2016-07-12 ENCOUNTER — Encounter: Payer: Self-pay | Admitting: Family Medicine

## 2016-07-12 DIAGNOSIS — Z4789 Encounter for other orthopedic aftercare: Secondary | ICD-10-CM | POA: Diagnosis not present

## 2016-07-12 DIAGNOSIS — G609 Hereditary and idiopathic neuropathy, unspecified: Secondary | ICD-10-CM | POA: Diagnosis not present

## 2016-07-12 DIAGNOSIS — R278 Other lack of coordination: Secondary | ICD-10-CM | POA: Diagnosis not present

## 2016-07-12 DIAGNOSIS — M6281 Muscle weakness (generalized): Secondary | ICD-10-CM | POA: Diagnosis not present

## 2016-07-16 ENCOUNTER — Emergency Department (HOSPITAL_COMMUNITY): Payer: Medicare Other | Admitting: Anesthesiology

## 2016-07-16 ENCOUNTER — Encounter (HOSPITAL_BASED_OUTPATIENT_CLINIC_OR_DEPARTMENT_OTHER): Payer: Self-pay | Admitting: *Deleted

## 2016-07-16 ENCOUNTER — Inpatient Hospital Stay (HOSPITAL_BASED_OUTPATIENT_CLINIC_OR_DEPARTMENT_OTHER)
Admission: EM | Admit: 2016-07-16 | Discharge: 2016-07-22 | DRG: 871 | Disposition: A | Discharge status: Rehabilitation Facility | Payer: Medicare Other | Attending: Internal Medicine | Admitting: Internal Medicine

## 2016-07-16 ENCOUNTER — Emergency Department (HOSPITAL_BASED_OUTPATIENT_CLINIC_OR_DEPARTMENT_OTHER): Payer: Medicare Other

## 2016-07-16 ENCOUNTER — Encounter (HOSPITAL_COMMUNITY)
Admission: EM | Disposition: A | Discharge status: Rehabilitation Facility | Payer: Self-pay | Source: Home / Self Care | Attending: Internal Medicine

## 2016-07-16 DIAGNOSIS — G8929 Other chronic pain: Secondary | ICD-10-CM | POA: Diagnosis present

## 2016-07-16 DIAGNOSIS — L89151 Pressure ulcer of sacral region, stage 1: Secondary | ICD-10-CM | POA: Diagnosis present

## 2016-07-16 DIAGNOSIS — R262 Difficulty in walking, not elsewhere classified: Secondary | ICD-10-CM | POA: Diagnosis present

## 2016-07-16 DIAGNOSIS — M21372 Foot drop, left foot: Secondary | ICD-10-CM | POA: Diagnosis present

## 2016-07-16 DIAGNOSIS — M1990 Unspecified osteoarthritis, unspecified site: Secondary | ICD-10-CM | POA: Diagnosis present

## 2016-07-16 DIAGNOSIS — A419 Sepsis, unspecified organism: Secondary | ICD-10-CM | POA: Diagnosis present

## 2016-07-16 DIAGNOSIS — R1032 Left lower quadrant pain: Secondary | ICD-10-CM | POA: Diagnosis not present

## 2016-07-16 DIAGNOSIS — M21371 Foot drop, right foot: Secondary | ICD-10-CM | POA: Diagnosis present

## 2016-07-16 DIAGNOSIS — Z87891 Personal history of nicotine dependence: Secondary | ICD-10-CM

## 2016-07-16 DIAGNOSIS — J9811 Atelectasis: Secondary | ICD-10-CM | POA: Diagnosis not present

## 2016-07-16 DIAGNOSIS — D649 Anemia, unspecified: Secondary | ICD-10-CM | POA: Diagnosis present

## 2016-07-16 DIAGNOSIS — R5381 Other malaise: Secondary | ICD-10-CM | POA: Diagnosis not present

## 2016-07-16 DIAGNOSIS — N12 Tubulo-interstitial nephritis, not specified as acute or chronic: Secondary | ICD-10-CM | POA: Diagnosis not present

## 2016-07-16 DIAGNOSIS — M7022 Olecranon bursitis, left elbow: Secondary | ICD-10-CM | POA: Diagnosis not present

## 2016-07-16 DIAGNOSIS — Z981 Arthrodesis status: Secondary | ICD-10-CM

## 2016-07-16 DIAGNOSIS — D72829 Elevated white blood cell count, unspecified: Secondary | ICD-10-CM

## 2016-07-16 DIAGNOSIS — N136 Pyonephrosis: Secondary | ICD-10-CM | POA: Diagnosis present

## 2016-07-16 DIAGNOSIS — R109 Unspecified abdominal pain: Secondary | ICD-10-CM | POA: Diagnosis not present

## 2016-07-16 DIAGNOSIS — M4316 Spondylolisthesis, lumbar region: Secondary | ICD-10-CM | POA: Diagnosis not present

## 2016-07-16 DIAGNOSIS — N179 Acute kidney failure, unspecified: Secondary | ICD-10-CM | POA: Diagnosis present

## 2016-07-16 DIAGNOSIS — M419 Scoliosis, unspecified: Secondary | ICD-10-CM | POA: Diagnosis present

## 2016-07-16 DIAGNOSIS — D6959 Other secondary thrombocytopenia: Secondary | ICD-10-CM | POA: Diagnosis present

## 2016-07-16 DIAGNOSIS — N202 Calculus of kidney with calculus of ureter: Secondary | ICD-10-CM | POA: Diagnosis present

## 2016-07-16 DIAGNOSIS — I248 Other forms of acute ischemic heart disease: Secondary | ICD-10-CM | POA: Diagnosis present

## 2016-07-16 DIAGNOSIS — I959 Hypotension, unspecified: Secondary | ICD-10-CM | POA: Diagnosis not present

## 2016-07-16 DIAGNOSIS — N132 Hydronephrosis with renal and ureteral calculous obstruction: Secondary | ICD-10-CM | POA: Diagnosis not present

## 2016-07-16 DIAGNOSIS — I10 Essential (primary) hypertension: Secondary | ICD-10-CM | POA: Diagnosis present

## 2016-07-16 DIAGNOSIS — A4151 Sepsis due to Escherichia coli [E. coli]: Secondary | ICD-10-CM | POA: Diagnosis present

## 2016-07-16 DIAGNOSIS — Z79899 Other long term (current) drug therapy: Secondary | ICD-10-CM

## 2016-07-16 DIAGNOSIS — E876 Hypokalemia: Secondary | ICD-10-CM | POA: Diagnosis not present

## 2016-07-16 DIAGNOSIS — M109 Gout, unspecified: Secondary | ICD-10-CM | POA: Diagnosis present

## 2016-07-16 DIAGNOSIS — K5901 Slow transit constipation: Secondary | ICD-10-CM | POA: Diagnosis present

## 2016-07-16 DIAGNOSIS — Z8249 Family history of ischemic heart disease and other diseases of the circulatory system: Secondary | ICD-10-CM

## 2016-07-16 DIAGNOSIS — B9620 Unspecified Escherichia coli [E. coli] as the cause of diseases classified elsewhere: Secondary | ICD-10-CM

## 2016-07-16 DIAGNOSIS — R7881 Bacteremia: Secondary | ICD-10-CM

## 2016-07-16 DIAGNOSIS — E872 Acidosis: Secondary | ICD-10-CM | POA: Diagnosis present

## 2016-07-16 DIAGNOSIS — R110 Nausea: Secondary | ICD-10-CM | POA: Diagnosis not present

## 2016-07-16 DIAGNOSIS — Z6822 Body mass index (BMI) 22.0-22.9, adult: Secondary | ICD-10-CM | POA: Diagnosis not present

## 2016-07-16 DIAGNOSIS — Z87442 Personal history of urinary calculi: Secondary | ICD-10-CM

## 2016-07-16 DIAGNOSIS — R6521 Severe sepsis with septic shock: Secondary | ICD-10-CM | POA: Diagnosis not present

## 2016-07-16 DIAGNOSIS — K219 Gastro-esophageal reflux disease without esophagitis: Secondary | ICD-10-CM | POA: Diagnosis present

## 2016-07-16 DIAGNOSIS — Z833 Family history of diabetes mellitus: Secondary | ICD-10-CM

## 2016-07-16 DIAGNOSIS — L8990 Pressure ulcer of unspecified site, unspecified stage: Secondary | ICD-10-CM | POA: Insufficient documentation

## 2016-07-16 DIAGNOSIS — R739 Hyperglycemia, unspecified: Secondary | ICD-10-CM | POA: Diagnosis not present

## 2016-07-16 DIAGNOSIS — N201 Calculus of ureter: Secondary | ICD-10-CM

## 2016-07-16 DIAGNOSIS — Z823 Family history of stroke: Secondary | ICD-10-CM

## 2016-07-16 DIAGNOSIS — N200 Calculus of kidney: Secondary | ICD-10-CM

## 2016-07-16 DIAGNOSIS — E46 Unspecified protein-calorie malnutrition: Secondary | ICD-10-CM | POA: Diagnosis present

## 2016-07-16 DIAGNOSIS — M5416 Radiculopathy, lumbar region: Secondary | ICD-10-CM | POA: Diagnosis present

## 2016-07-16 HISTORY — PX: CYSTOSCOPY WITH STENT PLACEMENT: SHX5790

## 2016-07-16 LAB — CBC WITH DIFFERENTIAL/PLATELET
BASOS PCT: 0 %
Band Neutrophils: 6 %
Basophils Absolute: 0 K/uL (ref 0.0–0.1)
Basophils Absolute: 0 10*3/uL (ref 0.0–0.1)
Basophils Relative: 0 %
EOS ABS: 0 10*3/uL (ref 0.0–0.7)
EOS PCT: 0 %
Eosinophils Absolute: 0 K/uL (ref 0.0–0.7)
Eosinophils Relative: 0 %
HCT: 38.2 % — ABNORMAL LOW (ref 39.0–52.0)
HCT: 33.3 % — ABNORMAL LOW (ref 39.0–52.0)
HEMOGLOBIN: 11.4 g/dL — AB (ref 13.0–17.0)
Hemoglobin: 13.3 g/dL (ref 13.0–17.0)
LYMPHS PCT: 2 %
Lymphocytes Relative: 2 %
Lymphs Abs: 0.4 K/uL — ABNORMAL LOW (ref 0.7–4.0)
Lymphs Abs: 0.6 10*3/uL — ABNORMAL LOW (ref 0.7–4.0)
MCH: 30.8 pg (ref 26.0–34.0)
MCH: 30.4 pg (ref 26.0–34.0)
MCHC: 34.8 g/dL (ref 30.0–36.0)
MCHC: 34.2 g/dL (ref 30.0–36.0)
MCV: 88.4 fL (ref 78.0–100.0)
MCV: 88.8 fL (ref 78.0–100.0)
MONO ABS: 0.9 10*3/uL (ref 0.1–1.0)
Monocytes Absolute: 0.4 K/uL (ref 0.1–1.0)
Monocytes Relative: 2 %
Monocytes Relative: 3 %
NEUTROS PCT: 95 %
Neutro Abs: 18.9 K/uL — ABNORMAL HIGH (ref 1.7–7.7)
Neutro Abs: 29.8 10*3/uL — ABNORMAL HIGH (ref 1.7–7.7)
Neutrophils Relative %: 90 %
PLATELETS: 90 10*3/uL — AB (ref 150–400)
Platelets: 135 K/uL — ABNORMAL LOW (ref 150–400)
RBC: 4.32 MIL/uL (ref 4.22–5.81)
RBC: 3.75 MIL/uL — AB (ref 4.22–5.81)
RDW: 14.9 % (ref 11.5–15.5)
RDW: 15.4 % (ref 11.5–15.5)
WBC: 19.7 K/uL — ABNORMAL HIGH (ref 4.0–10.5)
WBC: 31.3 10*3/uL — AB (ref 4.0–10.5)

## 2016-07-16 LAB — COMPREHENSIVE METABOLIC PANEL
ALT: 27 U/L (ref 17–63)
ALT: 23 U/L (ref 17–63)
ANION GAP: 9 (ref 5–15)
AST: 41 U/L (ref 15–41)
AST: 28 U/L (ref 15–41)
Albumin: 3.4 g/dL — ABNORMAL LOW (ref 3.5–5.0)
Albumin: 2.6 g/dL — ABNORMAL LOW (ref 3.5–5.0)
Alkaline Phosphatase: 110 U/L (ref 38–126)
Alkaline Phosphatase: 118 U/L (ref 38–126)
Anion gap: 13 (ref 5–15)
BUN: 41 mg/dL — AB (ref 6–20)
BUN: 34 mg/dL — ABNORMAL HIGH (ref 6–20)
CALCIUM: 8.7 mg/dL — AB (ref 8.9–10.3)
CHLORIDE: 96 mmol/L — AB (ref 101–111)
CHLORIDE: 106 mmol/L (ref 101–111)
CO2: 21 mmol/L — ABNORMAL LOW (ref 22–32)
CO2: 23 mmol/L (ref 22–32)
Calcium: 7.7 mg/dL — ABNORMAL LOW (ref 8.9–10.3)
Creatinine, Ser: 1.29 mg/dL — ABNORMAL HIGH (ref 0.61–1.24)
Creatinine, Ser: 1.12 mg/dL (ref 0.61–1.24)
GFR calc Af Amer: >60 mL/min (ref >60)
GFR calc non Af Amer: >60 mL/min (ref >60)
GFR, EST NON AFRICAN AMERICAN: 55 mL/min — AB (ref >60)
Glucose, Bld: 258 mg/dL — ABNORMAL HIGH (ref 65–99)
Glucose, Bld: 90 mg/dL (ref 65–99)
POTASSIUM: 3.7 mmol/L (ref 3.5–5.1)
POTASSIUM: 3.3 mmol/L — AB (ref 3.5–5.1)
SODIUM: 138 mmol/L (ref 135–145)
Sodium: 130 mmol/L — ABNORMAL LOW (ref 135–145)
TOTAL PROTEIN: 6.5 g/dL (ref 6.5–8.1)
Total Bilirubin: 0.8 mg/dL (ref 0.3–1.2)
Total Bilirubin: 1 mg/dL (ref 0.3–1.2)
Total Protein: 5.5 g/dL — ABNORMAL LOW (ref 6.5–8.1)

## 2016-07-16 LAB — URINALYSIS, ROUTINE W REFLEX MICROSCOPIC
Bilirubin Urine: NEGATIVE
GLUCOSE, UA: 100 mg/dL — AB
KETONES UR: 15 mg/dL — AB
NITRITE: POSITIVE — AB
PROTEIN: 30 mg/dL — AB
Specific Gravity, Urine: 1.021 (ref 1.005–1.030)
pH: 5.5 (ref 5.0–8.0)

## 2016-07-16 LAB — I-STAT CG4 LACTIC ACID, ED: Lactic Acid, Venous: 4.81 mmol/L (ref 0.5–1.9)

## 2016-07-16 LAB — URINE MICROSCOPIC-ADD ON

## 2016-07-16 LAB — PROTIME-INR
INR: 1.27
PROTHROMBIN TIME: 16 s — AB (ref 11.4–15.2)

## 2016-07-16 LAB — TYPE AND SCREEN
ABO/RH(D): O POS
ANTIBODY SCREEN: NEGATIVE

## 2016-07-16 LAB — GLUCOSE, CAPILLARY: GLUCOSE-CAPILLARY: 85 mg/dL (ref 65–99)

## 2016-07-16 LAB — LACTIC ACID, PLASMA
LACTIC ACID, VENOUS: 2.1 mmol/L — AB (ref 0.5–1.9)
Lactic Acid, Venous: 2.6 mmol/L (ref 0.5–1.9)

## 2016-07-16 LAB — PROCALCITONIN: Procalcitonin: 53.2 ng/mL

## 2016-07-16 LAB — APTT: aPTT: 34 seconds (ref 24–36)

## 2016-07-16 LAB — TROPONIN I: TROPONIN I: 0.25 ng/mL — AB (ref <0.03)

## 2016-07-16 SURGERY — CYSTOSCOPY, WITH STENT INSERTION
Anesthesia: General | Site: Ureter | Laterality: Left

## 2016-07-16 MED ORDER — MEPERIDINE HCL 25 MG/ML IJ SOLN: 6.2500 mg | INTRAMUSCULAR | Status: DC | PRN

## 2016-07-16 MED ORDER — LACTATED RINGERS IV SOLN
INTRAVENOUS | Status: DC | PRN
  Administered 2016-07-16 (×2): via INTRAVENOUS

## 2016-07-16 MED ORDER — PHENYLEPHRINE HCL 10 MG/ML IJ SOLN
INTRAMUSCULAR | Status: DC | PRN
Start: 2016-07-16 — End: 2016-07-16
  Administered 2016-07-16 (×6): 120 ug via INTRAVENOUS
  Administered 2016-07-16: 80 ug via INTRAVENOUS

## 2016-07-16 MED ORDER — ONDANSETRON HCL 4 MG/2ML IJ SOLN
INTRAMUSCULAR | Status: AC
  Filled 2016-07-16: qty 2

## 2016-07-16 MED ORDER — LIDOCAINE HCL (CARDIAC) 20 MG/ML IV SOLN
INTRAVENOUS | Status: DC | PRN
  Administered 2016-07-16: 100 mg via INTRAVENOUS

## 2016-07-16 MED ORDER — ZOLPIDEM TARTRATE 5 MG PO TABS: 5.0000 mg | ORAL_TABLET | Freq: Every evening | ORAL | Status: DC | PRN

## 2016-07-16 MED ORDER — ACETAMINOPHEN 325 MG PO TABS
650.0000 mg | ORAL_TABLET | Freq: Once | ORAL | Status: AC
  Administered 2016-07-16: 650 mg via ORAL
  Filled 2016-07-16: qty 2

## 2016-07-16 MED ORDER — SODIUM CHLORIDE 0.9 % IR SOLN
Status: DC | PRN
  Administered 2016-07-16: 3000 mL

## 2016-07-16 MED ORDER — VANCOMYCIN HCL IN DEXTROSE 750-5 MG/150ML-% IV SOLN
750.0000 mg | Freq: Two times a day (BID) | INTRAVENOUS | Status: DC
  Administered 2016-07-17: 750 mg via INTRAVENOUS
  Filled 2016-07-16: qty 150

## 2016-07-16 MED ORDER — ACETAMINOPHEN 325 MG PO TABS: 650.0000 mg | ORAL_TABLET | ORAL | Status: DC | PRN

## 2016-07-16 MED ORDER — SODIUM CHLORIDE 0.9 % IV BOLUS (SEPSIS)
1000.0000 mL | Freq: Once | INTRAVENOUS | Status: AC
  Administered 2016-07-16: 1000 mL via INTRAVENOUS

## 2016-07-16 MED ORDER — HYDROMORPHONE HCL 1 MG/ML IJ SOLN: 1.0000 mg | INTRAMUSCULAR | Status: DC | PRN

## 2016-07-16 MED ORDER — DEXTROSE 5 % IV SOLN
0.0000 ug/min | INTRAVENOUS | Status: DC
  Administered 2016-07-16: 60 ug/min via INTRAVENOUS
  Administered 2016-07-17: 17 ug/min via INTRAVENOUS
  Filled 2016-07-16 (×3): qty 1

## 2016-07-16 MED ORDER — DEXTROSE 5 % IV SOLN: 2.0000 g | INTRAVENOUS | Status: DC

## 2016-07-16 MED ORDER — FENTANYL CITRATE (PF) 100 MCG/2ML IJ SOLN
50.0000 ug | Freq: Once | INTRAMUSCULAR | Status: AC
  Administered 2016-07-16: 50 ug via INTRAVENOUS
  Filled 2016-07-16: qty 2

## 2016-07-16 MED ORDER — PHENYLEPHRINE HCL 10 MG/ML IJ SOLN
INTRAMUSCULAR | Status: DC | PRN
  Administered 2016-07-16: 20 ug/min via INTRAVENOUS

## 2016-07-16 MED ORDER — LIDOCAINE 2% (20 MG/ML) 5 ML SYRINGE
INTRAMUSCULAR | Status: AC
  Filled 2016-07-16: qty 5

## 2016-07-16 MED ORDER — PHENYLEPHRINE 40 MCG/ML (10ML) SYRINGE FOR IV PUSH (FOR BLOOD PRESSURE SUPPORT)
PREFILLED_SYRINGE | INTRAVENOUS | Status: AC
  Filled 2016-07-16: qty 10

## 2016-07-16 MED ORDER — DEXTROSE 5 % IV SOLN
2.0000 g | Freq: Once | INTRAVENOUS | Status: AC
  Administered 2016-07-16: 2 g via INTRAVENOUS
  Filled 2016-07-16: qty 2

## 2016-07-16 MED ORDER — LACTATED RINGERS IV SOLN
INTRAVENOUS | Status: DC
  Administered 2016-07-16: 18:00:00 via INTRAVENOUS

## 2016-07-16 MED ORDER — OXYCODONE-ACETAMINOPHEN 5-325 MG PO TABS: 1.0000 | ORAL_TABLET | ORAL | Status: DC | PRN

## 2016-07-16 MED ORDER — 0.9 % SODIUM CHLORIDE (POUR BTL) OPTIME
TOPICAL | Status: DC | PRN
  Administered 2016-07-16: 1000 mL

## 2016-07-16 MED ORDER — PIPERACILLIN-TAZOBACTAM 3.375 G IVPB 30 MIN
3.3750 g | INTRAVENOUS | Status: AC
  Administered 2016-07-16: 3.375 g via INTRAVENOUS
  Filled 2016-07-16 (×2): qty 50

## 2016-07-16 MED ORDER — CEFTRIAXONE SODIUM 1 G IJ SOLR: 1.0000 g | INTRAMUSCULAR | Status: DC

## 2016-07-16 MED ORDER — PROPOFOL 10 MG/ML IV BOLUS
INTRAVENOUS | Status: AC
  Filled 2016-07-16: qty 20

## 2016-07-16 MED ORDER — ASPIRIN 300 MG RE SUPP: 300.0000 mg | RECTAL | Status: AC

## 2016-07-16 MED ORDER — SODIUM CHLORIDE 0.9 % IV SOLN
INTRAVENOUS | Status: DC
  Administered 2016-07-16 – 2016-07-18 (×4): via INTRAVENOUS

## 2016-07-16 MED ORDER — ONDANSETRON HCL 4 MG/2ML IJ SOLN: 4.0000 mg | Freq: Four times a day (QID) | INTRAMUSCULAR | Status: DC | PRN

## 2016-07-16 MED ORDER — FENTANYL CITRATE (PF) 100 MCG/2ML IJ SOLN
25.0000 ug | INTRAMUSCULAR | Status: DC | PRN
Start: 2016-07-16 — End: 2016-07-16
  Administered 2016-07-16: 50 ug via INTRAVENOUS

## 2016-07-16 MED ORDER — METHOCARBAMOL 500 MG PO TABS: 500.0000 mg | ORAL_TABLET | Freq: Four times a day (QID) | ORAL | Status: DC | PRN

## 2016-07-16 MED ORDER — SODIUM CHLORIDE 0.9 % IV SOLN: INTRAVENOUS | Status: DC

## 2016-07-16 MED ORDER — FENTANYL CITRATE (PF) 100 MCG/2ML IJ SOLN
INTRAMUSCULAR | Status: AC
  Filled 2016-07-16: qty 2

## 2016-07-16 MED ORDER — EPHEDRINE 5 MG/ML INJ
INTRAVENOUS | Status: AC
  Filled 2016-07-16: qty 10

## 2016-07-16 MED ORDER — SODIUM CHLORIDE 0.9 % IV BOLUS (SEPSIS)
250.0000 mL | Freq: Once | INTRAVENOUS | Status: AC
  Administered 2016-07-16: 250 mL via INTRAVENOUS

## 2016-07-16 MED ORDER — ASPIRIN 81 MG PO CHEW
324.0000 mg | CHEWABLE_TABLET | ORAL | Status: AC
  Administered 2016-07-16: 324 mg via ORAL
  Filled 2016-07-16: qty 4

## 2016-07-16 MED ORDER — SODIUM CHLORIDE 0.9% FLUSH
3.0000 mL | Freq: Two times a day (BID) | INTRAVENOUS | Status: DC
  Administered 2016-07-18 – 2016-07-22 (×5): 3 mL via INTRAVENOUS

## 2016-07-16 MED ORDER — VANCOMYCIN HCL 10 G IV SOLR
1500.0000 mg | INTRAVENOUS | Status: AC
  Administered 2016-07-16: 1500 mg via INTRAVENOUS
  Filled 2016-07-16 (×2): qty 1500

## 2016-07-16 MED ORDER — ONDANSETRON HCL 4 MG/2ML IJ SOLN
4.0000 mg | Freq: Once | INTRAMUSCULAR | Status: AC | PRN
  Administered 2016-07-16: 4 mg via INTRAVENOUS

## 2016-07-16 MED ORDER — PHENYLEPHRINE HCL 10 MG/ML IJ SOLN
INTRAMUSCULAR | Status: AC
  Filled 2016-07-16: qty 1

## 2016-07-16 MED ORDER — PROPOFOL 10 MG/ML IV BOLUS
INTRAVENOUS | Status: DC | PRN
  Administered 2016-07-16: 140 mg via INTRAVENOUS

## 2016-07-16 MED ORDER — SODIUM CHLORIDE 0.9 % IV SOLN
250.0000 mL | INTRAVENOUS | Status: DC | PRN
Start: 2016-07-16 — End: 2016-07-22

## 2016-07-16 MED ORDER — PIPERACILLIN-TAZOBACTAM 3.375 G IVPB
3.3750 g | Freq: Three times a day (TID) | INTRAVENOUS | Status: DC
  Administered 2016-07-17: 3.375 g via INTRAVENOUS
  Filled 2016-07-16: qty 50

## 2016-07-16 SURGICAL SUPPLY — 14 items
BAG URINE DRAINAGE (UROLOGICAL SUPPLIES) ×2 IMPLANT
BAG URO CATCHER STRL LF (MISCELLANEOUS) ×3 IMPLANT
CATH FOLEY 2WAY SLVR  5CC 16FR (CATHETERS) ×2
CATH FOLEY 2WAY SLVR 5CC 16FR (CATHETERS) IMPLANT
CATH INTERMIT  6FR 70CM (CATHETERS) ×3 IMPLANT
CLOTH BEACON ORANGE TIMEOUT ST (SAFETY) ×3 IMPLANT
GLOVE BIOGEL M 8.0 STRL (GLOVE) ×3 IMPLANT
GOWN STRL REUS W/TWL LRG LVL3 (GOWN DISPOSABLE) ×6 IMPLANT
GUIDEWIRE STR DUAL SENSOR (WIRE) ×3 IMPLANT
MANIFOLD NEPTUNE II (INSTRUMENTS) ×3 IMPLANT
PACK CYSTO (CUSTOM PROCEDURE TRAY) ×3 IMPLANT
STENT CONTOUR 6FRX26X.038 (STENTS) ×2 IMPLANT
TUBING CONNECTING 10 (TUBING) ×2 IMPLANT
TUBING CONNECTING 10' (TUBING) ×1

## 2016-07-16 NOTE — ED Triage Notes (Signed)
Patient c/o intermittent Left side flank pain    started around 1am Thursday. He took hydrocodone last on Thursday morning. He states that he had spine surgery in May and has to use a walker to ambulate, but has been extremely weak for the past few days and cannot ambulate without assistance. Decreased appetite.

## 2016-07-16 NOTE — H&P (Signed)
History and Physical    Ethan Rose [id number] DOB: Nov 17, 1945 DOA: 07/16/2016  PCP: Blanchie Serve, MD   Patient coming from: [facility]  Chief Complaint: Left flank pain  HPI: Ethan Rose is a 70 y.o. gentleman with a history of nephrolithiasis s/p lithotripsy x 4 in the past, prior cervical and lumbar spine surgeries, and GERD who presented to the ED in [facility] for evaluation of two days of progressive left flank pain associated with fever to 102 (documented at home) and generalized weakness. Symptoms started Thursday morning.  Pain and fever were refractory to advil and hydrocodone.  He attempted to get an appointment with his primary urologist on Friday, but he was unsuccessful.  He denies dysuria or hematuria.  No nausea or vomiting.  No syncope.  ED Course: CT scan stone protocol showed an 72mm mid left ureteral calculus with moderate left sided hydroureteronephrosis.  Urology was consulted promptly.  Code Sepsis was activated.  U/A showed infection (nitrite positive, large leukocytes, TNTC WBC, many bacteria) with mild AKI (BUN 41, Cr 1.29), leukocytosis of 19.7, and lactic acid level of 4.81.  Blood and urine cultures were sent.  The patient received IV Rocephin.  He received 2.25L of NS based on sepsis protocol.  He was transferred to [facility] and taken directly to the OR with urology for left ureteral stent placement.  Post-operatively, the patient arrived to the ICU/stepdown [facility] at [facility] in shock and requiring neosynephrine infusion.  Hospitalist asked to evaluate and admit.  Review of Systems: As per HPI otherwise 10 point review of systems negative.    Past Medical History:  Diagnosis Date  . Allergic rhinitis   . Arthritis    carpal tunnel, L hand gout, arthritis - spine   . Cancer ([place])    squamous cell- R thigh- 01/2015  . Cervical myelopathy ([place])   . Chronic low back pain   . Essential hypertension   . GERD (gastroesophageal reflux disease)   .  Hay fever   . Kidney calculi    Multiple passed in the past.   . Leukocytosis   . Neuromuscular disorder ([facility])    spondylolisthesis - lumbar region  . Olecranon bursitis 06/22/2016   Dr. Percell Miller  . Physical deconditioning   . Prediabetes 2015   HbA1c 6.4%: pt saw nutritionist  . Slow transit constipation   . 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 Vitamin D deficiency     Past Surgical History:  Procedure Laterality Date  . ABDOMINAL EXPOSURE N/A 03/24/2015   Procedure: ABDOMINAL EXPOSURE;  Surgeon: [name], MD;  Location: [facility] NEURO ORS;  Service: Vascular;  Laterality: N/A;  left side approach  . ANTERIOR CERVICAL DECOMP/DISCECTOMY FUSION N/A 03/18/2016   Procedure: Cervical four-five Cervical five-six Cervical six-seven Anterior cervical decompression/diskectomy/fusion;  Surgeon: [name], MD;  Location: [place] NEURO ORS;  Service: Neurosurgery;  Laterality: N/A;  n  . ANTERIOR LAT LUMBAR FUSION Right 03/24/2015   Procedure: Right Lumbar two-Three,Lumbar Three-Four,Lumbar Four-Five Anterior lateral lumbar interbody fusion ;  Surgeon: [name], MD;  Location: [place] NEURO ORS;  Service: Neurosurgery;  Laterality: Right;  right  . COLONOSCOPY  approx 2011   Dr. [name] (normal per pt report)  . HERNIA REPAIR  1993   Bilateral inguinal (urologist did these procedures)  . LITHOTRIPSY     Multiple; most recent was spring 2015  . POSTERIOR LUMBAR FUSION 4 LEVEL N/A 03/27/2015   Procedure: Posterior pedicle subtraction osteotomy with T10  to Iliac fusion with Dr. [name] assisting;  Surgeon: [name], MD;  Location: [facility] NEURO ORS;  Service: Neurosurgery;  Laterality: N/A;  Posterior pedicle subtraction osteotomy with T10 to Iliac fusion with Dr. [name] assisting  . TONSILLECTOMY  1954     reports that he quit smoking about 2 years ago. His smoking use included Cigarettes. He smoked 0.50 packs per day. He has quit using smokeless tobacco. His smokeless tobacco  use included Chew. He reports that he drinks about 4.8 oz of alcohol per week . He reports that he does not use drugs.  He is a widower.  He has one adult daughter [name], who is his next of kin.  He drinks one beer every night, but says that he has not had a drink in several days because he has been feeling poorly.  He denies a history of EtOH withdrawal.  No Known Allergies  Family History  Problem Relation Age of Onset  . Stroke Mother   . Diabetes Mother   . Heart disease Mother   . Hypertension Mother   . Heart attack Mother   . Stroke Father   . Heart disease Father   . Diabetes Brother   . Hypertension Brother      Prior to Admission medications   Medication Sig Start Date End Date Taking? Authorizing Provider  Cholecalciferol 5000 UNITS capsule Take 5,000 Units by mouth daily.   Yes Historical Provider, MD  HYDROcodone-acetaminophen (NORCO/VICODIN) 5-325 MG tablet Take 1-2 tablets by mouth every 4 (four) hours as needed for moderate pain or severe pain (mild pain). 03/21/16  Yes [name], MD  Multiple Vitamins-Minerals (MULTIVITAMIN WITH MINERALS) tablet Take 1 tablet by mouth daily.   Yes Historical Provider, MD  OVER THE COUNTER MEDICATION Take 1 Dose by mouth daily. Over the counter OPC3 and Isochrome powders 1 capful of both mixed in 4 ounces of water daily   Yes Historical Provider, MD  OVER THE COUNTER MEDICATION Take 1 tablet by mouth daily. BP Platinum   Yes Historical Provider, MD  OVER THE COUNTER MEDICATION Take 1 capsule by mouth daily. neuroquell   Yes Historical Provider, MD  OVER THE COUNTER MEDICATION Take 1 capsule by mouth 2 (two) times daily. Nerve renew   Yes Historical Provider, MD  PAPAYA PO Take 3 tablets by mouth daily as needed (heartburn).    Yes Historical Provider, MD  saccharomyces boulardii (FLORASTOR) 250 MG capsule Take 250 mg by mouth daily.    Yes Historical Provider, MD  Zinc Methionate 50 MG CAPS Take 50 mg by mouth daily.   Yes Historical  Provider, MD    Physical Exam: Vitals:   07/16/16 1900 07/16/16 1915 07/16/16 1930 07/16/16 1957  BP: (!) 76/54 (!) 75/51 (!) 85/56   Pulse: (!) 114  (!) 112   Resp: (!) 21  20   Temp:    98.8 F (37.1 C)  TempSrc:    Oral  SpO2: 91%  93%   Weight:      Height:          Constitutional: NAD, calm, comfortable Vitals:   07/16/16 1900 07/16/16 1915 07/16/16 1930 07/16/16 1957  BP: (!) 76/54 (!) 75/51 (!) 85/56   Pulse: (!) 114  (!) 112   Resp: (!) 21  20   Temp:    98.8 F (37.1 C)  TempSrc:    Oral  SpO2: 91%  93%   Weight:      Height:  Eyes: PERRL, lids and conjunctivae normal ENMT: Mucous membranes are slightly dry. Posterior pharynx clear of any exudate or lesions. Normal dentition.  Neck: normal appearance, supple, no masses Respiratory: clear to auscultation bilaterally, no wheezing, no crackles. Normal respiratory effort. No accessory muscle use.  Cardiovascular: Tachycardic but regular.  No murmurs / rubs / gallops. No extremity edema. 2+ pedal pulses. No carotid bruits.  GI: abdomen is soft and compressible.  No distention.  No tenderness.  No masses palpated.  Bowel sounds are hypoactive. Musculoskeletal:  No joint deformity in upper and lower extremities. Good ROM, no contractures. Normal muscle tone.  Skin: no rashes, warm and dry Neurologic: CN 2-12 grossly intact. Sensation intact, Strength symmetric bilaterally, 5/5  Psychiatric: Normal judgment and insight. Alert and oriented x 3. Normal mood.     Labs on Admission: I have personally reviewed following labs and imaging studies  CBC:  Recent Labs Lab 07/16/16 1200  WBC 19.7*  NEUTROABS 18.9*  HGB 13.3  HCT 38.2*  MCV 88.4  PLT [id number]*   Basic Metabolic Panel:  Recent Labs Lab 07/16/16 1200  NA 130*  K 3.7  CL 96*  CO2 21*  GLUCOSE 258*  BUN 41*  CREATININE 1.29*  CALCIUM 8.7*   GFR: Estimated Creatinine Clearance: 55 mL/min (by C-G formula based on SCr of 1.29 mg/dL). Liver  Function Tests:  Recent Labs Lab 07/16/16 1200  AST 41  ALT 27  ALKPHOS 110  BILITOT 0.8  PROT 6.5  ALBUMIN 3.4*   CBG:  Recent Labs Lab 07/16/16 1743  GLUCAP 85   Urine analysis:    Component Value Date/Time   COLORURINE YELLOW 07/16/2016 1130   APPEARANCEUR TURBID (A) 07/16/2016 1130   LABSPEC 1.021 07/16/2016 1130   PHURINE 5.5 07/16/2016 1130   GLUCOSEU 100 (A) 07/16/2016 1130   HGBUR LARGE (A) 07/16/2016 1130   BILIRUBINUR NEGATIVE 07/16/2016 1130   BILIRUBINUR neg 03/30/2012 1543   KETONESUR 15 (A) 07/16/2016 1130   PROTEINUR 30 (A) 07/16/2016 1130   UROBILINOGEN 0.2 03/30/2012 1543   NITRITE POSITIVE (A) 07/16/2016 1130   LEUKOCYTESUR LARGE (A) 07/16/2016 1130   Sepsis Labs:  First lactic acid level 4.81, repeat pending Calcitonin pending  Radiological Exams on Admission: Ct Renal Stone Study  Result Date: 07/16/2016 CLINICAL DATA:  Left flank pain, fever, history of renal stones status post lithotripsy EXAM: CT ABDOMEN AND PELVIS WITHOUT CONTRAST TECHNIQUE: Multidetector CT imaging of the abdomen and pelvis was performed following the standard protocol without IV contrast. COMPARISON:  02/27/2014 FINDINGS: Lower chest: Trace left pleural effusion. Mild dependent atelectasis in the bilateral lung bases. Hepatobiliary: Mild hepatic steatosis. Gallbladder is unremarkable. No intrahepatic or extrahepatic ductal dilatation. Pancreas: Pancreatic atrophy. Spleen: Within normal limits. Adrenals/Urinary Tract: Adrenal glands are unremarkable. Multiple small bilateral renal calculi measuring up to 3 mm bilaterally. Moderate left hydroureteronephrosis. Associated 8 mm mid left ureteral calculus (series 2/ image 49). Bladder is within normal limits. Stomach/Bowel: Stomach is within normal limits. No evidence of bowel obstruction. Vascular/Lymphatic: Atherosclerotic calcifications of the abdominal aorta and branch vessels. No evidence of abdominal aortic aneurysm. No suspicious  abdominopelvic lymphadenopathy. Reproductive: Prostate is notable for dystrophic calcifications. Other: No abdominopelvic ascites. Tiny fat containing right inguinal hernia (series 2/image 80). Musculoskeletal: Mild posterior wedging at L1 with associated posterior thoracolumbar fixation extending from T10 through S1. IMPRESSION: 8 mm mid left ureteral calculus. Associated moderate left hydroureteronephrosis. Multiple small nonobstructing bilateral renal calculi measuring up to 3 mm. Mild posterior wedging at L1.  Associated posterior thoracolumbar fixation extending from T10 through S1. Additional ancillary findings as above. Electronically Signed   By: Julian Hy M.D.   On: 07/16/2016 13:28     Assessment/Plan Principal Problem:   Severe sepsis with septic shock [facility]) Active Problems:   Pyelonephritis   Left ureteral stone   Septic shock (HCC)      Septic shock secondary to complicated UTI/pyelonephritis, obstructing left ureteral stone with hydroureteronephrosis --Urology assistance greatly appreciated.  Foley catheter in place post-operatively. --Critical care consult accepted by Dr. Ashok Cordia due to refractory hypotension after weight based volume resuscitation per protocol.  Two additional liters of NS were given upon arrival to the floor, but the patient has not been able to wean from the neosynephrine drip. --Blood and urine cultures are pending --Sepsis orderset reactivated for repeat labs --Will broaden antibiotic coverage to IV vanc and zosyn for now per critical care recommendations. --Strict I/O  AKI --Monitor labs.  Will be influenced by shock. --Strict I/O --Foley in place --Urine culture pending  Hyperglycemia --Will check A1c, no history of DM --Clear liquids only for now  DVT prophylaxis: SCDs Code Status: FULL Family Communication: Daughter [name] present at bedside at time of my assessment in the ICU Disposition Plan: Expect he will discharge home when  ready. Consults called: Urology, PCCM Admission status: Inpatient, ICU   TIME SPENT: 75 minutes   Eber Jones MD [facility] Pager [phone]  If 7PM-7AM, please contact night-coverage [email or web address] Password [email or web address]  07/16/2016, 9:02 PM

## 2016-07-16 NOTE — H&P (Signed)
PULMONARY / CRITICAL CARE MEDICINE   Name: Ethan Rose MRN: [id number] DOB: 16-Nov-1945    ADMISSION DATE:  07/16/2016 CONSULTATION DATE:  July 16, 2016  REFERRING MD:  Ethan Rose  CHIEF COMPLAINT:  Sepsis  HISTORY OF PRESENT ILLNESS:   See H&P by Dr. Eulas Rose from this evening for complete details, but breifly, Ethan Rose is a 70 y/o man with a hx of nephrolithiasis who presented with sepsis due to an infected stone, and was taken to the OR this evening for stone retrieval and stent placement.  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  PAST SURGICAL HISTORY: He  has a past surgical history that includes Lithotripsy; Tonsillectomy (1954); Colonoscopy (approx 2011); Hernia repair (1993); Anterior lat lumbar fusion (Right, 03/24/2015); Abdominal exposure (N/A, 03/24/2015); Posterior lumbar fusion 4 level (N/A, 03/27/2015); and Anterior cervical decomp/discectomy fusion (N/A, 03/18/2016).  No Known Allergies  No current facility-administered medications on file prior to encounter.    Current Outpatient Prescriptions on File Prior to Encounter  Medication Sig  . Cholecalciferol 5000 UNITS capsule Take 5,000 Units by mouth daily.  Marland Kitchen HYDROcodone-acetaminophen (NORCO/VICODIN) 5-325 MG tablet Take 1-2 tablets by mouth every 4 (four) hours as needed for moderate pain or severe pain (mild pain).  . Multiple Vitamins-Minerals (MULTIVITAMIN WITH MINERALS) tablet Take 1 tablet by mouth daily.  Marland Kitchen saccharomyces boulardii (FLORASTOR) 250 MG capsule Take 250 mg by mouth daily.      FAMILY HISTORY:  His indicated that the status of his mother is unknown. He indicated that the status of his father is unknown. He indicated that the status of his brother is unknown. He indicated that his daughter is alive.    SOCIAL HISTORY: He  reports that he quit smoking about 2 years ago. His smoking use included Cigarettes. He smoked 0.50 packs per day. He has quit using smokeless tobacco. His smokeless tobacco use included Chew. He reports that he drinks about 4.8 oz of alcohol per week . He reports that he does not use drugs.  REVIEW OF SYSTEMS:   Per HPI  SUBJECTIVE:  Awake, alert, and oriented.  VITAL SIGNS: BP 112/70   Pulse 96   Temp 98.8 F (37.1 C) (Oral)   Resp (!) 7   Ht 5\' 10"  (1.778 m)   Wt 164 lb 14.5 oz (74.8 kg)   SpO2 95%   BMI 23.66 kg/m   HEMODYNAMICS: Neo @ 40    VENTILATOR SETTINGS: None    INTAKE / OUTPUT: I/O last 3 completed shifts: In: 2100 [I.V.:2100] Out: 25 [Urine:25]  PHYSICAL EXAMINATION: General:  Elderly man appearing stated age. Neuro:  Awake, alert, and oriented. HEENT:  MMM Cardiovascular:  RRR Lungs:  CTA Abdomen:  Soft, nontender Musculoskeletal:  No joint swelling Skin:  No rashes on visible skin  LABS:  BMET  Recent Labs Lab 07/16/16 1200 07/16/16 2056  NA 130* 138  K 3.7 3.3*  CL 96* 106  CO2 21* 23  BUN 41* 34*  CREATININE 1.29* 1.12  GLUCOSE 258* 90    Electrolytes  Recent Labs Lab 07/16/16 1200 07/16/16 2056  CALCIUM 8.7* 7.7*    CBC  Recent Labs Lab 07/16/16 1200  WBC  19.7*  HGB 13.3  HCT 38.2*  PLT 135*    Coag's  Recent Labs Lab 07/16/16 2056  APTT 34  INR 1.27    Sepsis Markers  Recent Labs Lab 07/16/16 1205 07/16/16 2043 07/16/16 2056  LATICACIDVEN 4.81* 2.6*  --   PROCALCITON  --   --  53.20    ABG No results for input(s): PHART, PCO2ART, PO2ART in the last 168 hours.  Liver Enzymes  Recent Labs Lab 07/16/16 1200 07/16/16 2056  AST 41 28  ALT  27 23  ALKPHOS 110 118  BILITOT 0.8 1.0  ALBUMIN 3.4* 2.6*    Cardiac Enzymes  Recent Labs Lab 07/16/16 2056  TROPONINI 0.25*    Glucose  Recent Labs Lab 07/16/16 1743  GLUCAP 85    Imaging Ct Renal Stone Study  Result Date: 07/16/2016 CLINICAL DATA:  Left flank pain, fever, history of renal stones status Rose lithotripsy EXAM: CT ABDOMEN AND PELVIS WITHOUT CONTRAST TECHNIQUE: Multidetector CT imaging of the abdomen and pelvis was performed following the standard protocol without IV contrast. COMPARISON:  02/27/2014 FINDINGS: Lower chest: Trace left pleural effusion. Mild dependent atelectasis in the bilateral lung bases. Hepatobiliary: Mild hepatic steatosis. Gallbladder is unremarkable. No intrahepatic or extrahepatic ductal dilatation. Pancreas: Pancreatic atrophy. Spleen: Within normal limits. Adrenals/Urinary Tract: Adrenal glands are unremarkable. Multiple small bilateral renal calculi measuring up to 3 mm bilaterally. Moderate left hydroureteronephrosis. Associated 8 mm mid left ureteral calculus (series 2/ image 49). Bladder is within normal limits. Stomach/Bowel: Stomach is within normal limits. No evidence of bowel obstruction. Vascular/Lymphatic: Atherosclerotic calcifications of the abdominal aorta and branch vessels. No evidence of abdominal aortic aneurysm. No suspicious abdominopelvic lymphadenopathy. Reproductive: Prostate is notable for dystrophic calcifications. Other: No abdominopelvic ascites. Tiny fat containing right inguinal hernia (series 2/image 80). Musculoskeletal: Mild posterior wedging at L1 with associated posterior thoracolumbar fixation extending from T10 through S1. IMPRESSION: 8 mm mid left ureteral calculus. Associated moderate left hydroureteronephrosis. Multiple small nonobstructing bilateral renal calculi measuring up to 3 mm. Mild posterior wedging at L1. Associated posterior thoracolumbar fixation extending from T10 through S1. Additional ancillary  findings as above. Electronically Signed   By: Ethan Rose M.D.   On: 07/16/2016 13:28    STUDIES:  CT stone protocol: 8 mm L ureteral stone. Multiple B non-obstructing renal stones.  CULTURES: Blood 9/2 >> pending Urine 9/2 >> pending  ANTIBIOTICS: Ceftriaxone x1 Vanc 9/2 >> Pip-tazo 9/3>>  SIGNIFICANT EVENTS: - OR on 9/2 for ureteral stent - low-dose peripheral neo started on 9/2  LINES/TUBES: PIV x2  DISCUSSION: 70 y/o man with septic shock due to renal stone  ASSESSMENT / PLAN:  PULMONARY A: No active issues P:    CARDIOVASCULAR A:  Septic Shock, improving P:  Improved w/ fluids, will likely improve o/n as this is natural history of sepsis of urinary origin Rose-procedure.  RENAL A:   AKI L renal stone, s/p stent placement P:   Management per urology if stent needs to be removed later.  GASTROINTESTINAL A:   No active issues P:    HEMATOLOGIC A:   No active issues P:   INFECTIOUS A:   Sepsis of urinary origin P:   Tx w/ vanc and pip-tazo Expect shock o/n, likely to improve in the AM as per natural history of disease state.   ENDOCRINE A:   Hyperglycemia  P:   ICU hyperglycemia protocol  NEUROLOGIC A:   No active issues P:     FAMILY  - Updates:  Updated. Pt has capacity  CRITICAL CARE Performed by: Luz Brazen   Total critical care time: 40 minutes  Critical care time was exclusive of separately billable procedures and treating other patients.  Critical care was necessary to treat or prevent imminent or life-threatening deterioration.  Critical care was time spent personally by me on the following activities: development of treatment plan with patient and/or surrogate as well as nursing, discussions with consultants, evaluation of patient's response to treatment, examination of patient, obtaining history from patient or surrogate, ordering and performing treatments and interventions, ordering and review of laboratory  studies, ordering and review of radiographic studies, pulse oximetry and re-evaluation of patient's condition.   Luz Brazen, MD Pulmonary and [place] Pager: [phone]  07/16/2016, 10:44 PM

## 2016-07-16 NOTE — Op Note (Signed)
.  Preoperative diagnosis: Left ureteral stone  Postoperative diagnosis: Same  Procedure: 1 cystoscopy 2. Left retrograde pyelography 3.  Intraoperative fluoroscopy, under one hour, with interpretation 4. Left 6 x 26 JJ stent placement  Attending: Nicolette Bang  Anesthesia: General  Estimated blood loss: None  Drains: Left 6 x 26 JJ ureteral stent without tether, 16 French foley catheter  Specimens: urine for culture  Antibiotics: Rocephin  Findings: left mid ureteral stone. Moderate hydronephrosis. No masses/lesions in the bladder. Ureteral orifices in normal anatomic location.  Indications: Patient is a 70 year old male with a history of left ureteral stone and concern for sepsis.  After discussing treatment options, they decided proceed with left stent placement.  Procedure her in detail: The patient was brought to the operating room and a brief timeout was done to ensure correct patient, correct procedure, correct site.  General anesthesia was administered patient was placed in dorsal lithotomy position.  Their genitalia was then prepped and draped in usual sterile fashion.  A rigid 26 French cystoscope was passed in the urethra and the bladder.  Bladder was inspected free masses or lesions.  the ureteral orifices were in the normal orthotopic locations.  a 6 french ureteral catheter was then instilled into the left ureteral orifice.  a gentle retrograde was obtained and findings noted above.  we then placed a zip wire through the ureteral catheter and advanced up to the renal pelvis.    We then placed a 6 x 26 double-j ureteral stent over the original zip wire.  We then removed the wire and good coil was noted in the the renal pelvis under fluoroscopy and the bladder under direct vision.  A foley catheter was then placed. the bladder was then drained and this concluded the procedure which was well tolerated by patient.  Complications: None  Condition: Stable, extubated, transferred  to PACU  Plan: Patient is to be admitted for IV antibiotics. He will have his stone extraction in 2 weeks.

## 2016-07-16 NOTE — Progress Notes (Signed)
Neo synephrine placed on IV Pump per Pharmacy orders

## 2016-07-16 NOTE — Consult Note (Signed)
Urology Consult  Referring physician: Noemi Chapel, MD Reason for referral: ureteral calculus, sepsis  Chief Complaint: left flank pain  History of Present Illness: Ethan Rose is a 70yo with a hx of nephrolithiasis who presented to [facility] with fever of 102 and an 20m left ureteral calculus on CT. He has had 4 ESWLs in the past and passed 5 other calculi. [facility]. He developed severe, sharp, intermittent, nonradiating left flank pain 3 days ago and fever over 101 yesterday. WBC count is 19.7. He has associated nausea but no vomiting. No exacerbating/alleviating events  Past Medical History:  Diagnosis Date  . Allergic rhinitis   . Arthritis    carpal tunnel, L hand gout, arthritis - spine   . 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 [place]ervical myelopathy ([place]   . Chronic low back pain   . Essential hypertension   . GERD (gastroesophageal reflux disease)   . Hay fever   . Kidney calculi    Multiple passed in the past.   . Leukocytosis   . Neuromuscular disorder ([facility])    spondylolisthesis - lumbar region  . Olecranon bursitis 08-14-202017   Dr. MPercell Miller . Physical deconditioning   . Prediabetes 2015   HbA1c 6.4%: pt saw nutritionist  . Slow transit constipation   . 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 Vitamin D deficiency    Past Surgical History:  Procedure Laterality Date  . ABDOMINAL EXPOSURE N/A 03/24/2015   Procedure: ABDOMINAL EXPOSURE;  Surgeon: CAngelia Mould MD;  Location: [facility] NEURO ORS;  Service: Vascular;  Laterality: N/A;  left side approach  . ANTERIOR CERVICAL DECOMP/DISCECTOMY FUSION N/A 03/18/2016   Procedure: Cervical four-five Cervical five-six Cervical six-seven Anterior cervical decompression/diskectomy/fusion;  Surgeon: JErline Levine MD;  [facility];  Service: Neurosurgery;  Laterality: N/A;  n  . ANTERIOR LAT LUMBAR FUSION Right 03/24/2015   Procedure: Right Lumbar  two-Three,Lumbar Three-Four,Lumbar Four-Five Anterior lateral lumbar interbody fusion ;  Surgeon: JErline Levine MD;  [facility];  Service: Neurosurgery;  Laterality: Right;  right  . COLONOSCOPY  approx 2011   Dr. MEarlean Shawl(normal per pt report)  . HERNIA REPAIR  1993   Bilateral inguinal (urologist did these procedures)  . LITHOTRIPSY     Multiple; most recent was spring 2015  . POSTERIOR LUMBAR FUSION 4 LEVEL N/A 03/27/2015   Procedure: Posterior pedicle subtraction osteotomy with T10 to Iliac fusion with Dr. ARenaye Rakersassisting;  Surgeon: JErline Levine MD;  [facility];  Service: Neurosurgery;  Laterality: N/A;  Posterior pedicle subtraction osteotomy with T10 to Iliac fusion with Dr. ARenaye Rakersassisting  . TONSILLECTOMY  1954    Medications: I have reviewed the patient's current medications. Allergies: No Known Allergies  Family History  Problem Relation Age of Onset  . Stroke Mother   . Diabetes Mother   . Heart disease Mother   . Hypertension Mother   . Heart attack Mother   . Stroke Father   . Heart disease Father   . Diabetes Brother   . Hypertension Brother    Social History:  reports that he quit smoking about 2 years ago. His smoking use included Cigarettes. He smoked 0.50 packs per day. He has quit using smokeless tobacco. His smokeless tobacco use included Chew. He reports that he drinks about 4.8 oz of alcohol per week . He reports that he does not use drugs.  Review of Systems  Constitutional: Positive for chills and fever.  Gastrointestinal: Positive for nausea.  Genitourinary: Positive for dysuria, flank pain, frequency and urgency.  All other systems reviewed and are negative.   Physical Exam:  Vital signs in last 24 hours: Temp:  [98.4 F (36.9 C)-101.2 F (38.4 C)] 101.2 F (38.4 C) (09/02 1443) Pulse Rate:  [87-120] 120 (09/02 1443) Resp:  [13-19] 13 (09/02 1316) BP: (103-115)/(68-78) 115/77 (09/02 1316) SpO2:  [96 %-100 %]  100 % (09/02 1316) Weight:  [70.8 kg (156 lb)] 70.8 kg (156 lb) (09/02 1234) Physical Exam  Constitutional: He is oriented to person, place, and time. He appears well-developed and well-nourished.  HENT:  Head: Normocephalic and atraumatic.  Eyes: EOM are normal. Pupils are equal, round, and reactive to light.  Neck: Normal range of motion. No thyromegaly present.  Cardiovascular: Normal rate and regular rhythm.   Respiratory: Effort normal. No respiratory distress.  GI: Soft. He exhibits no distension. Hernia confirmed negative in the right inguinal area and confirmed negative in the left inguinal area.  Genitourinary: Testes normal and penis normal.  Musculoskeletal: Normal range of motion.  Lymphadenopathy:       Right: No inguinal adenopathy present.       Left: No inguinal adenopathy present.  Neurological: He is alert and oriented to person, place, and time.  Skin: Skin is warm and dry.  Psychiatric: He has a normal mood and affect. His behavior is normal. Judgment and thought content normal.    Laboratory Data:  Results for orders placed or performed during the hospital encounter of 07/16/16 (from the past 72 hour(s))  Urinalysis, Routine w reflex microscopic (not at [facility])     Status: Abnormal   Collection Time: 07/16/16 11:30 AM  Result Value Ref Range   Color, Urine YELLOW YELLOW   APPearance TURBID (A) CLEAR   Specific Gravity, Urine 1.021 1.005 - 1.030   pH 5.5 5.0 - 8.0   Glucose, UA 100 (A) NEGATIVE mg/dL   Hgb urine dipstick LARGE (A) NEGATIVE   Bilirubin Urine NEGATIVE NEGATIVE   Ketones, ur 15 (A) NEGATIVE mg/dL   Protein, ur 30 (A) NEGATIVE mg/dL   Nitrite POSITIVE (A) NEGATIVE   Leukocytes, UA LARGE (A) NEGATIVE  Urine microscopic-add on     Status: Abnormal   Collection Time: 07/16/16 11:30 AM  Result Value Ref Range   Squamous Epithelial / LPF 0-5 (A) NONE SEEN   WBC, UA TOO NUMEROUS TO COUNT 0 - 5 WBC/hpf   RBC / HPF 6-30 0 - 5 RBC/hpf   Bacteria, UA  MANY (A) NONE SEEN  Comprehensive metabolic panel     Status: Abnormal   Collection Time: 07/16/16 12:00 PM  Result Value Ref Range   Sodium 130 (L) 135 - 145 mmol/L   Potassium 3.7 3.5 - 5.1 mmol/L   Chloride 96 (L) 101 - 111 mmol/L   CO2 21 (L) 22 - 32 mmol/L   Glucose, Bld 258 (H) 65 - 99 mg/dL   BUN 41 (H) 6 - 20 mg/dL   Creatinine, Ser 1.29 (H) 0.61 - 1.24 mg/dL   Calcium 8.7 (L) 8.9 - 10.3 mg/dL   Total Protein 6.5 6.5 - 8.1 g/dL   Albumin 3.4 (L) 3.5 - 5.0 g/dL   AST 41 15 - 41 U/L   ALT 27 17 - 63 U/L   Alkaline Phosphatase 110 38 - 126 U/L   Total Bilirubin 0.8 0.3 - 1.2 mg/dL   GFR calc  non Af Amer 55 (L) >60 mL/min   GFR calc Af Amer >60 >60 mL/min    Comment: (NOTE) The eGFR has been calculated using the CKD EPI equation. This calculation has not been validated in all clinical situations. eGFR's persistently <60 mL/min signify possible Chronic Kidney Disease.    Anion gap 13 5 - 15  CBC WITH DIFFERENTIAL     Status: Abnormal   Collection Time: 07/16/16 12:00 PM  Result Value Ref Range   WBC 19.7 (H) 4.0 - 10.5 K/uL   RBC 4.32 4.22 - 5.81 MIL/uL   Hemoglobin 13.3 13.0 - 17.0 g/dL   HCT 38.2 (L) 39.0 - 52.0 %   MCV 88.4 78.0 - 100.0 fL   MCH 30.8 26.0 - 34.0 pg   MCHC 34.8 30.0 - 36.0 g/dL   RDW 14.9 11.5 - 15.5 %   Platelets 135 (L) 150 - 400 K/uL   Neutrophils Relative % 90 %   Lymphocytes Relative 2 %   Monocytes Relative 2 %   Eosinophils Relative 0 %   Basophils Relative 0 %   Band Neutrophils 6 %   Neutro Abs 18.9 (H) 1.7 - 7.7 K/uL   Lymphs Abs 0.4 (L) 0.7 - 4.0 K/uL   Monocytes Absolute 0.4 0.1 - 1.0 K/uL   Eosinophils Absolute 0.0 0.0 - 0.7 K/uL   Basophils Absolute 0.0 0.0 - 0.1 K/uL  I-Stat CG4 Lactic Acid, ED     Status: Abnormal   Collection Time: 07/16/16 12:05 PM  Result Value Ref Range   Lactic Acid, Venous 4.81 (HH) 0.5 - 1.9 mmol/L   Comment NOTIFIED PHYSICIAN    No results found for this or any previous visit (from the past 240  hour(s)). Creatinine:  Recent Labs  07/16/16 1200  CREATININE 1.29*   Baseline Creatinine: 1  Impression/Assessment:  70yo with left ureteral calculus and sepsis  Plan:  1. The risks/benefits/alternatives to left ureteral stent placement was explaiend to the patient and he understands and wishes to proceed with surgery 2. Pt will be admitted post op to hospitalist service for IV antibiotics  Nicolette Bang 07/16/2016, 4:30 PM

## 2016-07-16 NOTE — ED Provider Notes (Signed)
[place] DEPT [facility] Provider Note   CSN: [id number] Arrival date & time: 07/16/16  1105     History   Chief Complaint Chief Complaint  Patient presents with  . Flank Pain    HPI Ethan Rose is a 70 y.o. male.  The patient is a 70 year old male, he has had multiple spinal surgeries over the last year and a half including a lumbar surgery in May 2016 followed by a cervical surgery with fusion and discectomy in May 2017. He presents today after having several days of worsening symptoms that started on Thursday with fevers, left-sided flank pain and dark colored urine. Since that time he has grown progressively weaker and is now unable to stand by himself, unable to walk without significant amounts of assistance and cannot get up and down off of the commode by himself. He has not had any falls, he has not been able to drink much in the way of fluids as of weakness nausea and no appetite. He does note that his darkened urine has become more prominent, he has not had any diarrhea, his fever was as high as 102 on Thursday and since that time he has been treating it with ibuprofen. The patient had bilateral dropfoot as a sequela after his initial surgery, his arm weakness improved after his cervical surgery. On Wednesday the patient was able to ride a stationary bicycle with his physical therapy, he canceled his second physical therapy appointment this week because of his weakness. He was not able to sleep and had very decreased oral intake on Thursday the same time the symptoms started. He has pain in the left flank. There is no radiation, no midline tenderness and no difficulty passing urine, no retention, no incontinence. He did have some diarrhea last night and today.      Past Medical History:  Diagnosis Date  . Allergic rhinitis   . Arthritis    carpal tunnel, L hand gout, arthritis - spine   . 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 Cervical myelopathy ([place])   .  Chronic low back pain   . Essential hypertension   . GERD (gastroesophageal reflux disease)   . Hay fever   . Kidney calculi    Multiple passed in the past.   . Leukocytosis   . Neuromuscular disorder ([facility])    spondylolisthesis - lumbar region  . Olecranon bursitis 2020/06/2516   Dr. Percell   . Physical deconditioning   . Prediabetes 2015   HbA1c 6.4%: pt saw nutritionist  . Slow transit constipation   . 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 Vitamin D deficiency     Patient Active Problem List   Diagnosis Date Noted  . Cervical myelopathy ([place]) 03/18/2016  . 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 Other malaise and fatigue 05/14/2014  . Health maintenance examination 04/29/2013  . Weakness of both legs 04/29/2013  . Leg cramps 04/29/2013  . Elevated blood pressure reading without diagnosis of hypertension 03/30/2012  . Dizziness 03/30/2012    Past Surgical History:  Procedure Laterality Date  . ABDOMINAL EXPOSURE N/A 03/24/2015   Procedure: ABDOMINAL EXPOSURE;  Surgeon: [name], MD;  Location: [facility] NEURO ORS;  Service: Vascular;  Laterality: N/A;  left side approach  . ANTERIOR CERVICAL DECOMP/DISCECTOMY FUSION N/A 03/18/2016   Procedure: Cervical four-five Cervical five-six Cervical six-seven Anterior cervical decompression/diskectomy/fusion;  Surgeon: Broadus John  Vertell Limber, MD;  Location: [place] NEURO ORS;  Service: Neurosurgery;  Laterality: N/A;  n  . ANTERIOR LAT LUMBAR FUSION Right 03/24/2015   Procedure: Right Lumbar two-Three,Lumbar Three-Four,Lumbar Four-Five Anterior lateral lumbar interbody fusion ;  Surgeon: [name], MD;  Location: [place] NEURO ORS;  Service: Neurosurgery;  Laterality: Right;  right  . COLONOSCOPY  approx 2011   Dr. [name] (normal per pt report)  . HERNIA REPAIR  1993   Bilateral inguinal (urologist did these procedures)  .  LITHOTRIPSY     Multiple; most recent was spring 2015  . POSTERIOR LUMBAR FUSION 4 LEVEL N/A 03/27/2015   Procedure: Posterior pedicle subtraction osteotomy with T10 to Iliac fusion with Dr. [name] assisting;  Surgeon: [name], MD;  Location: [facility]URO ORS;  Service: Neurosurgery;  Laterality: N/A;  Posterior pedicle subtraction osteotomy with T10 to Iliac fusion with Dr. [name] assisting  . TONSILLECTOMY  1954       Home Medications    Prior to Admission medications   Medication Sig Start Date End Date Taking? Authorizing Provider  Cholecalciferol 5000 UNITS capsule Take 5,000 Units by mouth daily.    Historical Provider, MD  docusate sodium (COLACE) 100 MG capsule Take 100 mg by mouth 2 (two) times daily.    Historical Provider, MD  HYDROcodone-acetaminophen (NORCO/VICODIN) 5-325 MG tablet Take 1-2 tablets by mouth every 4 (four) hours as needed for moderate pain or severe pain (mild pain). 03/21/16   [name], MD  loratadine (CLARITIN) 10 MG tablet Take 10 mg by mouth daily as needed for allergies.    Historical Provider, MD  methocarbamol (ROBAXIN) 500 MG tablet Take 500 mg by mouth every 6 (six) hours as needed for muscle spasms.    Historical Provider, MD  Multiple Vitamins-Minerals (MULTIVITAMIN WITH MINERALS) tablet Take 1 tablet by mouth daily.    Historical Provider, MD  saccharomyces boulardii (FLORASTOR) 250 MG capsule Take 250 mg by mouth 2 (two) times daily.    Historical Provider, MD  Turmeric Curcumin 500 MG CAPS Take 1 capsule by mouth daily.    Historical Provider, MD  zolpidem (AMBIEN) 5 MG tablet Take 5 mg by mouth at bedtime as needed for sleep.    Historical Provider, MD    Family History Family History  Problem Relation Age of Onset  . Stroke Mother   . Diabetes Mother   . Heart disease Mother   . Hypertension Mother   . Heart attack Mother   . Stroke Father   . Heart disease Father   . Diabetes Brother   . Hypertension Brother     Social  History Social History  Substance Use Topics  . Smoking status: Former Smoker    Packs/day: 0.50    Types: Cigarettes    Quit date: 03/03/2014  . Smokeless tobacco: Former Systems developer    Types: Chew  . Alcohol use 4.8 oz/week    8 Cans of beer per week     Comment: 1-2 beers q day      Allergies   Review of patient's allergies indicates no known allergies.   Review of Systems Review of Systems  All other systems reviewed and are negative.    Physical Exam Updated Vital Signs BP 115/77   Pulse 87   Temp 98.4 F (36.9 C)   Resp 13   Ht 5\' 10"  (1.778 m)   Wt 156 lb (70.8 kg)   SpO2 100%   BMI 22.38 kg/m   Physical Exam  Constitutional: He appears well-developed and well-nourished. No distress.  HENT:  Head: Normocephalic and atraumatic.  Mouth/Throat: No oropharyngeal exudate.  Mucous membranes dehydrated, normal phonation, clear oropharynx  Eyes: Conjunctivae and EOM are normal. Pupils are equal, round, and reactive to light. Right eye exhibits no discharge. Left eye exhibits no discharge. No scleral icterus.  Neck: Normal range of motion. Neck supple. No JVD present. No thyromegaly present.  Cardiovascular: Regular rhythm, normal heart sounds and intact distal pulses.  Exam reveals no gallop and no friction rub.   No murmur heard. Tachycardia present  Pulmonary/Chest: Effort normal and breath sounds normal. No respiratory distress. He has no wheezes. He has no rales.  Abdominal: Soft. Bowel sounds are normal. He exhibits no distension and no mass. There is no tenderness.  Nontender abdomen, very soft  Musculoskeletal: Normal range of motion. He exhibits no edema or tenderness.  Lymphadenopathy:    He has no cervical adenopathy.  Neurological: He is alert. Coordination normal.  Normal strength in the bilateral upper extremities, no pronator drift. Foot drop present bilaterally in the lower extremities, the patient is barely able to lift his legs off the bed bilaterally.  He states this weaker than usual  Skin: Skin is warm and dry. No rash noted. No erythema.  Psychiatric: He has a normal mood and affect. His behavior is normal.  Nursing note and vitals reviewed.    ED Treatments / Results  Labs (all labs ordered are listed, but only abnormal results are displayed) Labs Reviewed  URINALYSIS, ROUTINE W REFLEX MICROSCOPIC (NOT AT [facility]) - Abnormal; Notable for the following:       Result Value   APPearance TURBID (*)    Glucose, UA 100 (*)    Hgb urine dipstick LARGE (*)    Ketones, ur 15 (*)    Protein, ur 30 (*)    Nitrite POSITIVE (*)    Leukocytes, UA LARGE (*)    All other components within normal limits  URINE MICROSCOPIC-ADD ON - Abnormal; Notable for the following:    Squamous Epithelial / LPF 0-5 (*)    Bacteria, UA MANY (*)    All other components within normal limits  COMPREHENSIVE METABOLIC PANEL - Abnormal; Notable for the following:    Sodium 130 (*)    Chloride 96 (*)    CO2 21 (*)    Glucose, Bld 258 (*)    BUN 41 (*)    Creatinine, Ser 1.29 (*)    Calcium 8.7 (*)    Albumin 3.4 (*)    GFR calc non Af Amer 55 (*)    All other components within normal limits  CBC WITH DIFFERENTIAL/PLATELET - Abnormal; Notable for the following:    WBC 19.7 (*)    HCT 38.2 (*)    Platelets 135 (*)    Neutro Abs 18.9 (*)    Lymphs Abs 0.4 (*)    All other components within normal limits  I-STAT CG4 LACTIC ACID, ED - Abnormal; Notable for the following:    Lactic Acid, Venous 4.81 (*)    All other components within normal limits  CULTURE, BLOOD (ROUTINE X 2)  CULTURE, BLOOD (ROUTINE X 2)  URINE CULTURE     Radiology Ct Renal Stone Study  Result Date: 07/16/2016 CLINICAL DATA:  Left flank pain, fever, history of renal stones status post lithotripsy EXAM: CT ABDOMEN AND PELVIS WITHOUT CONTRAST TECHNIQUE: Multidetector CT imaging of the abdomen and pelvis was performed following the standard protocol without IV contrast.  COMPARISON:   02/27/2014 FINDINGS: Lower chest: Trace left pleural effusion. Mild dependent atelectasis in the bilateral lung bases. Hepatobiliary: Mild hepatic steatosis. Gallbladder is unremarkable. No intrahepatic or extrahepatic ductal dilatation. Pancreas: Pancreatic atrophy. Spleen: Within normal limits. Adrenals/Urinary Tract: Adrenal glands are unremarkable. Multiple small bilateral renal calculi measuring up to 3 mm bilaterally. Moderate left hydroureteronephrosis. Associated 8 mm mid left ureteral calculus (series 2/ image 49). Bladder is within normal limits. Stomach/Bowel: Stomach is within normal limits. No evidence of bowel obstruction. Vascular/Lymphatic: Atherosclerotic calcifications of the abdominal aorta and branch vessels. No evidence of abdominal aortic aneurysm. No suspicious abdominopelvic lymphadenopathy. Reproductive: Prostate is notable for dystrophic calcifications. Other: No abdominopelvic ascites. Tiny fat containing right inguinal hernia (series 2/image 80). Musculoskeletal: Mild posterior wedging at L1 with associated posterior thoracolumbar fixation extending from T10 through S1. IMPRESSION: 8 mm mid left ureteral calculus. Associated moderate left hydroureteronephrosis. Multiple small nonobstructing bilateral renal calculi measuring up to 3 mm. Mild posterior wedging at L1. Associated posterior thoracolumbar fixation extending from T10 through S1. Additional ancillary findings as above. Electronically Signed   By: Julian Hy M.D.   On: 07/16/2016 13:28    Procedures Procedures (including critical care time)  Medications Ordered in ED Medications  cefTRIAXone (ROCEPHIN) 1 g in dextrose 5 % 50 mL IVPB (not administered)  acetaminophen (TYLENOL) tablet 650 mg (not administered)  fentaNYL (SUBLIMAZE) injection 50 mcg (not administered)  sodium chloride 0.9 % bolus 1,000 mL (0 mLs Intravenous Stopped 07/16/16 1248)    And  sodium chloride 0.9 % bolus 1,000 mL (0 mLs Intravenous  Stopped 07/16/16 1324)    And  sodium chloride 0.9 % bolus 250 mL (0 mLs Intravenous Stopped 07/16/16 1359)  cefTRIAXone (ROCEPHIN) 2 g in dextrose 5 % 50 mL IVPB (0 g Intravenous Stopped 07/16/16 1324)     Initial Impression / Assessment and Plan / ED Course  I have reviewed the triage vital signs and the nursing notes.  Pertinent labs & imaging results that were available during my care of the patient were reviewed by me and considered in my medical decision making (see chart for details).  Clinical Course  Comment By Time  Labs reviewed and show a blood count of 19,700 with a significant leukocytosis and left shift. Kidney function has doubled from baseline from 0.6-1.3 and BUN has gone from 14-41. Lactic acid is almost 5 and the urinalysis shows many bacteria with too numerous to count white blood cells, positive nitrites. The patient's blood pressure is soft at 103, he is persistently tachycardic though with antibiotics and fluids he has improved and is no longer tachycardic. He will need to be admitted, he likely has sepsis from a urinary source. Noemi Chapel, MD 09/02 1311  D/w Dr. Alyson Ingles who agrees to take pt to OR for stent placemtn - requests transfer to [facility] ER. Noemi Chapel, MD 09/02 1340   D/w Dr. Cathlean Sauer - he will take care of admission when pt comes out of OR - pt informed - has tachcyardia and chills - likely recurrent fever - fluids givne, no hypotension at this time, lactic acid was elevated but on recheck has clinically improved until he had chills.  Needs ongoing monitoring when arrives at [facility].  D/w Charge at [facility] - will arrange for Urology to be called on pt arrival to [facility] ED Noemi Chapel, MD 09/02 1425    The patient appears clinically ill with tachycardia, he has been having fevers, his urinalysis is consistent with infection, he  states that he has had multiple kidney stones in the past thus raising the concern for possible kidney stone with superimposed infection. We will treat this  as sepsis given his tachycardia and likely leukocytosis and his generalized weakness. IV fluids, IV pain medications (patient declined), anti-emetics as needed, antibiotics have been ordered, CT scan ordered.  CRITICAL CARE Performed by: Johnna Acosta Total critical care time: 35 minutes Critical care time was exclusive of separately billable procedures and treating other patients. Critical care was necessary to treat or prevent imminent or life-threatening deterioration. Critical care was time spent personally by me on the following activities: development of treatment plan with patient and/or surrogate as well as nursing, discussions with consultants, evaluation of patient's response to treatment, examination of patient, obtaining history from patient or surrogate, ordering and performing treatments and interventions, ordering and review of laboratory studies, ordering and review of radiographic studies, pulse oximetry and re-evaluation of patient's condition.   Final Clinical Impressions(s) / ED Diagnoses   Final diagnoses:  Sepsis, due to unspecified organism [facility])  Pyelonephritis  Kidney stone   Medications  cefTRIAXone (ROCEPHIN) 1 g in dextrose 5 % 50 mL IVPB (not administered)  acetaminophen (TYLENOL) tablet 650 mg (not administered)  fentaNYL (SUBLIMAZE) injection 50 mcg (not administered)  sodium chloride 0.9 % bolus 1,000 mL (0 mLs Intravenous Stopped 07/16/16 1248)    And  sodium chloride 0.9 % bolus 1,000 mL (0 mLs Intravenous Stopped 07/16/16 1324)    And  sodium chloride 0.9 % bolus 250 mL (0 mLs Intravenous Stopped 07/16/16 1359)  cefTRIAXone (ROCEPHIN) 2 g in dextrose 5 % 50 mL IVPB (0 g Intravenous Stopped 07/16/16 1324)      Noemi Chapel, MD 07/16/16 1427

## 2016-07-16 NOTE — Anesthesia Procedure Notes (Signed)
Procedure Name: LMA Insertion Date/Time: 07/16/2016 4:37 PM Performed by: Glory Buff Pre-anesthesia Checklist: Patient identified, Emergency Drugs available, Suction available and Patient being monitored Patient Re-evaluated:Patient Re-evaluated prior to inductionOxygen Delivery Method: Circle system utilized Preoxygenation: Pre-oxygenation with 100% oxygen Intubation Type: IV induction LMA: LMA with gastric port inserted LMA Size: 4.0 Number of attempts: 1 Placement Confirmation: positive ETCO2 Tube secured with: Tape Dental Injury: Teeth and Oropharynx as per pre-operative assessment

## 2016-07-16 NOTE — Transfer of Care (Signed)
Immediate Anesthesia Transfer of Care Note  Patient: Ethan Rose  Procedure(s) Performed: Procedure(s): CYSTOSCOPY, RETROGRADE WITH LEFT URETERAL STENT PLACEMENT (Left)  Patient Location: PACU  Anesthesia Type:General  Level of Consciousness: awake, alert  and oriented  Airway & Oxygen Therapy: Patient Spontanous Breathing and Patient connected to face mask oxygen  Post-op Assessment: Report given to RN and Post -op Vital signs reviewed and stable  Post vital signs: Reviewed and stable  Last Vitals:  Vitals:   07/16/16 1412 07/16/16 1443  BP:    Pulse:  120  Resp:    Temp: 36.9 C 38.4 C    Last Pain:  Vitals:   07/16/16 1443  TempSrc: Axillary  PainSc:          Complications: No apparent anesthesia complications

## 2016-07-16 NOTE — Anesthesia Preprocedure Evaluation (Addendum)
Anesthesia Evaluation  Patient identified by MRN, date of birth, ID band Patient awake    Reviewed: Allergy & Precautions, NPO status , Patient's Chart, lab work & pertinent test results  Airway Mallampati: II  TM Distance: >3 FB Neck ROM: Full    Dental  (+) Teeth Intact, Dental Advisory Given   Pulmonary former smoker,    breath sounds clear to auscultation       Cardiovascular hypertension,  Rhythm:Regular Rate:Normal     Neuro/Psych    GI/Hepatic GERD  ,  Endo/Other    Renal/GU      Musculoskeletal   Abdominal   Peds  Hematology   Anesthesia Other Findings Febrile; Oriented , RA Sat 95% NPO okay for LMA  Reproductive/Obstetrics                            Anesthesia Physical  Anesthesia Plan  ASA: II  Anesthesia Plan: General   Post-op Pain Management:    Induction: Intravenous  Airway Management Planned: LMA  Additional Equipment:   Intra-op Plan:   Post-operative Plan:   Informed Consent: I have reviewed the patients History and Physical, chart, labs and discussed the procedure including the risks, benefits and alternatives for the proposed anesthesia with the patient or authorized representative who has indicated his/her understanding and acceptance.   Dental advisory given  Plan Discussed with: CRNA and Anesthesiologist  Anesthesia Plan Comments:                                         Anesthesia Evaluation  Patient identified by MRN, date of birth, ID band Patient awake    Reviewed: Allergy & Precautions, NPO status , Patient's Chart, lab work & pertinent test results  Airway Mallampati: II  TM Distance: >3 FB Neck ROM: Full    Dental  (+) Teeth Intact, Dental Advisory Given   Pulmonary former smoker,    breath sounds clear to auscultation       Cardiovascular  Rhythm:Regular Rate:Normal     Neuro/Psych    GI/Hepatic    Endo/Other    Renal/GU      Musculoskeletal   Abdominal   Peds  Hematology   Anesthesia Other Findings   Reproductive/Obstetrics                             Anesthesia Physical Anesthesia Plan  ASA: II  Anesthesia Plan: General   Post-op Pain Management:    Induction: Intravenous  Airway Management Planned: Oral ETT  Additional Equipment:   Intra-op Plan:   Post-operative Plan:   Informed Consent: I have reviewed the patients History and Physical, chart, labs and discussed the procedure including the risks, benefits and alternatives for the proposed anesthesia with the patient or authorized representative who has indicated his/her understanding and acceptance.   Dental advisory given  Plan Discussed with: CRNA and Anesthesiologist  Anesthesia Plan Comments:         Anesthesia Quick Evaluation  Anesthesia Quick Evaluation

## 2016-07-16 NOTE — ED Notes (Signed)
Paged Dr Alyson Ingles with [facility] to inform him patient had arrived to ED

## 2016-07-16 NOTE — Progress Notes (Signed)
Pharmacy Antibiotic Note  NEVO KLAUSEN is a 70 y.o. male admitted on 07/16/2016 with UTI. Pharmacy has been consulted for ceftriaxone dosing.  Ceftriaxone 2g IV x 1 ordered at [facility] then per pharmacy -> 1g q24h. Transferred to [facility]. Consulted for "Rocephin 2g" so increased to 2g q24h.  Then switched to [facility] for sepsis.  Plan: Vanc 1.5g IV x 1 then 750mg  q12h. Zosyn 3.375g IV Q8H infused over 4hrs. Measure Vanc trough at steady state. Follow up renal fxn, culture results, and clinical course.   Height: 5\' 10"  (177.8 cm) Weight: 164 lb 14.5 oz (74.8 kg) IBW/kg (Calculated) : 73  Temp (24hrs), Avg:99.5 F (37.5 C), Min:98.4 F (36.9 C), Max:101.2 F (38.4 C)   Recent Labs Lab 07/16/16 1200 07/16/16 1205  WBC 19.7*  --   CREATININE 1.29*  --   LATICACIDVEN  --  4.81*    Estimated Creatinine Clearance: 55 mL/min (by C-G formula based on SCr of 1.29 mg/dL).    No Known Allergies  Antimicrobials this admission: Ceftriaxone 9/2 Vanc 9/2 >> Zosyn 9/2 >>  Dose adjustments this admission:  Microbiology results: 9/2 BCx: sent 9/2 UCx: sent  Thank you for allowing pharmacy to be a part of this patient's care.  Romeo Rabon, PharmD, pager [phone]. 07/16/2016,8:14 PM.

## 2016-07-16 NOTE — Anesthesia Postprocedure Evaluation (Signed)
Anesthesia Post Note  Patient: Ethan Rose  Procedure(s) Performed: Procedure(s) (LRB): CYSTOSCOPY, RETROGRADE WITH LEFT URETERAL STENT PLACEMENT (Left)  Patient location during evaluation: PACU Anesthesia Type: General Level of consciousness: sedated Pain management: satisfactory to patient Vital Signs Assessment: post-procedure vital signs reviewed and stable Respiratory status: spontaneous breathing Cardiovascular status: stable Anesthetic complications: no Comments: Needs Neo qtt for BP support; pt feels well    Last Vitals:  Vitals:   07/16/16 1735 07/16/16 1745  BP: (!) 83/57 (!) 83/57  Pulse: (!) 111 (!) 113  Resp: (!) 23 (!) 29  Temp:      Last Pain:  Vitals:   07/16/16 1443  TempSrc: Axillary  PainSc:                  Riccardo Dubin

## 2016-07-16 NOTE — Progress Notes (Signed)
Pharmacy Antibiotic Note  Ethan Rose is a 70 y.o. male admitted on 07/16/2016 with UTI. Pharmacy has been consulted for ceftriaxone dosing.  Ceftriaxone 2g IV x 1 ordered in the ED. Afebrile.  Plan: Continue ceftriaxone 1g IV Q24 Monitor clinical picture F/U C&S, abx deescalation / LOT     Temp (24hrs), Avg:99.2 F (37.3 C), Min:99.2 F (37.3 C), Max:99.2 F (37.3 C)   Recent Labs Lab 07/16/16 1205  LATICACIDVEN 4.81*    CrCl cannot be calculated (Unknown ideal weight.).    No Known Allergies  Antimicrobials this admission: Ceftriaxone 9/2 >>   Dose adjustments this admission: n/a  Microbiology results: 9/2 BCx: sent 9/2 UCx: sent   Thank you for allowing pharmacy to be a part of this patient's care.  Reginia Naas 07/16/2016 12:31 PM

## 2016-07-16 NOTE — Progress Notes (Signed)
[facility] Progress Note Patient Name: Ethan Rose DOB: [birth year] MRN: [id number]   Date of Service  07/16/2016  HPI/Events of Note  Spoke with Dr. Eulas Post the hospitalist service regarding patient. Status post endoscopic removal of ureteral stone with stent placement. Started on empiric Rocephin. Postoperatively the Patient Has Required Low-Dose Peripheral Neo-Synephrine. Mildly Tachycardic. Currently on nasal cannula oxygen. Sepsis order set initiated with repeat lactic acid given earlier lactic acidosis. Cultures have been obtained. Patient receiving his third liter of IV fluid.   eICU Interventions  1. Recommended broadening antibiotic coverage given sepsis 2. Recommended continued IV fluid resuscitation with normal saline for additional 2 L bringing the total to approximately 5 L of normal saline 3. Continuing ICU monitoring. 4. Hospitalist service to notify me if condition does not improve with regard to vasopressor requirement after fluid resuscitation      Intervention Category Evaluation Type: New Patient Evaluation  Tera Partridge 07/16/2016, 7:36 PM

## 2016-07-17 DIAGNOSIS — A419 Sepsis, unspecified organism: Secondary | ICD-10-CM

## 2016-07-17 DIAGNOSIS — R6521 Severe sepsis with septic shock: Secondary | ICD-10-CM

## 2016-07-17 DIAGNOSIS — R7881 Bacteremia: Secondary | ICD-10-CM

## 2016-07-17 DIAGNOSIS — D72829 Elevated white blood cell count, unspecified: Secondary | ICD-10-CM

## 2016-07-17 LAB — BLOOD CULTURE ID PANEL (REFLEXED)

## 2016-07-17 LAB — ABO/RH: ABO/RH(D): O POS

## 2016-07-17 LAB — BASIC METABOLIC PANEL
ANION GAP: 6 (ref 5–15)
BUN: 29 mg/dL — ABNORMAL HIGH (ref 6–20)
CHLORIDE: 110 mmol/L (ref 101–111)
CO2: 21 mmol/L — ABNORMAL LOW (ref 22–32)
Calcium: 7.7 mg/dL — ABNORMAL LOW (ref 8.9–10.3)
Creatinine, Ser: 0.92 mg/dL (ref 0.61–1.24)
Glucose, Bld: 86 mg/dL (ref 65–99)
POTASSIUM: 3.5 mmol/L (ref 3.5–5.1)
SODIUM: 137 mmol/L (ref 135–145)

## 2016-07-17 LAB — CBC
HCT: 32.3 % — ABNORMAL LOW (ref 39.0–52.0)
HEMOGLOBIN: 11 g/dL — AB (ref 13.0–17.0)
MCH: 29.8 pg (ref 26.0–34.0)
MCHC: 34.1 g/dL (ref 30.0–36.0)
MCV: 87.5 fL (ref 78.0–100.0)
PLATELETS: 75 10*3/uL — AB (ref 150–400)
RBC: 3.69 MIL/uL — AB (ref 4.22–5.81)
RDW: 15.5 % (ref 11.5–15.5)
WBC: 19.9 10*3/uL — AB (ref 4.0–10.5)

## 2016-07-17 LAB — HEMOGLOBIN A1C
HEMOGLOBIN A1C: 6.2 % — AB (ref 4.8–5.6)
MEAN PLASMA GLUCOSE: 131 mg/dL

## 2016-07-17 LAB — TROPONIN I: Troponin I: 0.32 ng/mL (ref <0.03)

## 2016-07-17 LAB — LACTIC ACID, PLASMA: LACTIC ACID, VENOUS: 1.4 mmol/L (ref 0.5–1.9)

## 2016-07-17 LAB — CORTISOL: CORTISOL PLASMA: 44.1 ug/dL

## 2016-07-17 MED ORDER — DEXTROSE 5 % IV SOLN
2.0000 g | INTRAVENOUS | Status: DC
  Administered 2016-07-17 – 2016-07-21 (×5): 2 g via INTRAVENOUS
  Filled 2016-07-17 (×6): qty 2

## 2016-07-17 NOTE — Progress Notes (Signed)
PHARMACY - PHYSICIAN COMMUNICATION CRITICAL VALUE ALERT - BLOOD CULTURE IDENTIFICATION (BCID)  Results for orders placed or performed during the hospital encounter of 07/16/16  Blood Culture ID Panel (Reflexed) (Collected: 07/16/2016 12:40 PM)  Result Value Ref Range   Enterococcus species NOT DETECTED NOT DETECTED   Listeria monocytogenes NOT DETECTED NOT DETECTED   Staphylococcus species NOT DETECTED NOT DETECTED   Staphylococcus aureus NOT DETECTED NOT DETECTED   Streptococcus species NOT DETECTED NOT DETECTED   Streptococcus agalactiae NOT DETECTED NOT DETECTED   Streptococcus pneumoniae NOT DETECTED NOT DETECTED   Streptococcus pyogenes NOT DETECTED NOT DETECTED   Acinetobacter baumannii NOT DETECTED NOT DETECTED   Enterobacteriaceae species DETECTED (A) NOT DETECTED   Enterobacter cloacae complex NOT DETECTED NOT DETECTED   Escherichia coli DETECTED (A) NOT DETECTED   Klebsiella oxytoca NOT DETECTED NOT DETECTED   Klebsiella pneumoniae NOT DETECTED NOT DETECTED   Proteus species NOT DETECTED NOT DETECTED   Serratia marcescens NOT DETECTED NOT DETECTED   Carbapenem resistance NOT DETECTED NOT DETECTED   Haemophilus influenzae NOT DETECTED NOT DETECTED   Neisseria meningitidis NOT DETECTED NOT DETECTED   Pseudomonas aeruginosa NOT DETECTED NOT DETECTED   Candida albicans NOT DETECTED NOT DETECTED   Candida glabrata NOT DETECTED NOT DETECTED   Candida krusei NOT DETECTED NOT DETECTED   Candida parapsilosis NOT DETECTED NOT DETECTED   Candida tropicalis NOT DETECTED NOT DETECTED    Name of physician (or Provider) Contacted: Dr. Charlies Silvers (11:40)  Changes to prescribed antibiotics required: - D/C vanc - Change Zosyn to Ceftriaxone 2g IV q24h  Gretta Arab PharmD, BCPS Pager [phone] 07/17/2016 11:31 AM

## 2016-07-17 NOTE — Progress Notes (Signed)
PULMONARY / CRITICAL CARE MEDICINE   Name: Ethan Rose MRN: [id number] DOB: August 06, 1946    ADMISSION DATE:  07/16/2016 CONSULTATION DATE: 07/16/2016  REFERRING MD:  Dr. Eulas Post, [facility]  CHIEF COMPLAINT: Lt Flank Pain  SUBJECTIVE:  Feels weak.  Flank pain better.  Appetite improved.  VITAL SIGNS: BP 105/66   Pulse 78   Temp 97.6 F (36.4 C) (Oral)   Resp 11   Ht 5\' 10"  (1.778 m)   Wt 171 lb 4.8 oz (77.7 kg)   SpO2 97%   BMI 24.58 kg/m   INTAKE / OUTPUT: I/O last 3 completed shifts: In: 4462 [P.O.:100; I.V.:3812; IV Piggyback:550] Out: 850 [Urine:850]  PHYSICAL EXAMINATION: General: pleasant Neuro:  Alert, normal strength HEENT:  No stridor Cardiovascular:  Regular, no murmur Lungs:  No wheeze Abdomen:  Soft, non tender Musculoskeletal:  No edema Skin:  No rashes  LABS:  BMET  Recent Labs Lab 07/16/16 1200 07/16/16 2056 07/17/16 0521  NA 130* 138 137  K 3.7 3.3* 3.5  CL 96* 106 110  CO2 21* 23 21*  BUN 41* 34* 29*  CREATININE 1.29* 1.12 0.92  GLUCOSE 258* 90 86    Electrolytes  Recent Labs Lab 07/16/16 1200 07/16/16 2056 07/17/16 0521  CALCIUM 8.7* 7.7* 7.7*    CBC  Recent Labs Lab 07/16/16 1200 07/16/16 2253 07/17/16 0521  WBC 19.7* 31.3* 19.9*  HGB 13.3 11.4* 11.0*  HCT 38.2* 33.3* 32.3*  PLT 135* 90* 75*    Coag's  Recent Labs Lab 07/16/16 2056  APTT 34  INR 1.27    Sepsis Markers  Recent Labs Lab 07/16/16 2043 07/16/16 2056 07/16/16 2253 07/17/16 0521  LATICACIDVEN 2.6*  --  2.1* 1.4  PROCALCITON  --  53.20  --   --     ABG No results for input(s): PHART, PCO2ART, PO2ART in the last 168 hours.  Liver Enzymes  Recent Labs Lab 07/16/16 1200 07/16/16 2056  AST 41 28  ALT 27 23  ALKPHOS 110 118  BILITOT 0.8 1.0  ALBUMIN 3.4* 2.6*    Cardiac Enzymes  Recent Labs Lab 07/16/16 2056 07/17/16 0521  TROPONINI 0.25* 0.32*    Glucose  Recent Labs Lab 07/16/16 1743  GLUCAP 85    Imaging Ct  Renal Stone Study  Result Date: 07/16/2016 CLINICAL DATA:  Left flank pain, fever, history of renal stones status post lithotripsy EXAM: CT ABDOMEN AND PELVIS WITHOUT CONTRAST TECHNIQUE: Multidetector CT imaging of the abdomen and pelvis was performed following the standard protocol without IV contrast. COMPARISON:  02/27/2014 FINDINGS: Lower chest: Trace left pleural effusion. Mild dependent atelectasis in the bilateral lung bases. Hepatobiliary: Mild hepatic steatosis. Gallbladder is unremarkable. No intrahepatic or extrahepatic ductal dilatation. Pancreas: Pancreatic atrophy. Spleen: Within normal limits. Adrenals/Urinary Tract: Adrenal glands are unremarkable. Multiple small bilateral renal calculi measuring up to 3 mm bilaterally. Moderate left hydroureteronephrosis. Associated 8 mm mid left ureteral calculus (series 2/ image 49). Bladder is within normal limits. Stomach/Bowel: Stomach is within normal limits. No evidence of bowel obstruction. Vascular/Lymphatic: Atherosclerotic calcifications of the abdominal aorta and branch vessels. No evidence of abdominal aortic aneurysm. No suspicious abdominopelvic lymphadenopathy. Reproductive: Prostate is notable for dystrophic calcifications. Other: No abdominopelvic ascites. Tiny fat containing right inguinal hernia (series 2/image 80). Musculoskeletal: Mild posterior wedging at L1 with associated posterior thoracolumbar fixation extending from T10 through S1. IMPRESSION: 8 mm mid left ureteral calculus. Associated moderate left hydroureteronephrosis. Multiple small nonobstructing bilateral renal calculi measuring up to 3 mm. Mild posterior  wedging at L1. Associated posterior thoracolumbar fixation extending from T10 through S1. Additional ancillary findings as above. Electronically Signed   By: Julian Hy M.D.   On: 07/16/2016 13:28     STUDIES:  CT abd/pelvis 9/02 >> 8 mm Lt ureteral stone, multiple non obstructing stones  CULTURES: 9/02 Blood >> E  coli 9/02 Urine >>   ANTIBIOTICS: 9/02 Vancomycin >> 9/03 9/02 Zosyn >> 9/03 9/03 Rocephin >>   SIGNIFICANT EVENTS: 9/02 Admit, urology consulted  LINES/TUBES:  DISCUSSION: 70 yo male with Lt flank pain, fever 102F with 8 mm Lt ureteral stone with hx of nephrolithiasis.  He had Lt retrograde pyelography with stent placement.  Had hypotension requiring pressors post-procedure.  ASSESSMENT / PLAN:  PULMONARY A: Atelectasis. P:   Bronchial hygiene  CARDIOVASCULAR A:  Septic shock >> off pressors 9/03. P:  Continue IV fluids  RENAL A:   Nephrolithiasis with Lt ureteral stone s/p stent. P:   Per urology, primary team  GASTROINTESTINAL A:   Nutrition. P:   Advance diet as tolerated  HEMATOLOGIC A:   Leukocytosis. Anemia, thrombocytopenia from sepsis. P:  F/u CBC  INFECTIOUS A:   Sepsis with E coli bacteremia in setting of ureteral stone. P:   Continue rocephin D/c vancomycin, zosyn F/u procalcitonin  ENDOCRINE A:   Hyperglycemia. P:   Monitor blood sugar  NEUROLOGIC A:   Pain control. P:   Prn pain meds  Updated pt's wife at bedside.  D/w urology.  PCCM will sign off.  Please call if additional help needed.  Chesley Mires, MD [facility] Pulmonary/Critical Care 07/17/2016, 12:13 PM Pager:  [phone] After 3pm call: [phone]

## 2016-07-17 NOTE — Progress Notes (Signed)
Positive troponin noted.  I suspect this is related to demand ischemia in the setting of severe sepsis with shock.  Aspirin given last night.  I will not anticoagulate at this time because I believe the patient is high risk for bleeding S/P urology procedure yesterday and I do not believe this is acute coronary syndrome.  The patient has not had active chest pain.  Consider additional cardiology work up when sepsis has resolved and infection is clear.

## 2016-07-17 NOTE — Progress Notes (Addendum)
Patient ID: Ethan Rose, male   DOB: 04-24-1946, 70 y.o.   MRN: [id number]  PROGRESS NOTE    JAKHARI SPACE  JOA:[id number] DOB: 1946-03-09 DOA: 07/16/2016  PCP: Blanchie Serve, MD   Brief Narrative:  70 y.o. male with a past medical history of nephrolithiasis s/p lithotripsy x 4 in the past, prior cervical and lumbar spine surgeries, GERD who presented to the ED in [facility] for evaluation of two days of progressive left flank pain associated with fever up to 102F and generalized weakness.   CT scan stone protocol showed an 61m mid left ureteral calculus with moderate left sided hydroureteronephrosis.  Urology was consulted.  Code Sepsis was activated.  U/A showed infection (nitrite positive, large leukocytes, TNTC WBC, many bacteria) with mild AKI (BUN 41, Cr 1.29), leukocytosis of 19.7 and lactic acid level of 4.81.  Blood and urine cultures were sent.  The patient received IV Rocephin.  He received 2.25L of NS based on sepsis protocol.  [facility] for left ureteral stent placement.  [facility]    Assessment & Plan:   Principal Problem:   Severe sepsis with septic shock due to E.Coli bacteremia (HSarles / Leukocytosis  - Sepsis criteria met on admission, source likely E.Coli bacteremia - Has required short course on pressor support (neosynephrine)  - Initially on vanco and zosyn but this changed to Rocephin based on blood culture results - Follow up urine culture results  - Monitor in SDU for next 24 hours  Active Problems:   Pyelonephritis / Left ureteral stone - S/P stent placement      Thrombocytopenia - Due to sepsis - No reports of bleeding- Monitor CBC daily    Acute kidney injury - Due to sepsis and pyelonephritis  - Resolved with IV fluids     Hypokalemia - Due to sepsis - Now WNL  DVT prophylaxis: SCD's  bilaterally  Code Status: full code  Family Communication: No family at the bedside this am Disposition Plan: remains in SDU as just taken off of pressors   Consultants:   GU  PCCM  Procedures:   Stent placement 07/16/16  Antimicrobials:   Vanco and zosyn stopped 07/17/2016   Rocephin 07/17/2016 -->    Subjective: No overnight events.  Objective: Vitals:   07/17/16 0400 07/17/16 0500 07/17/16 0600 07/17/16 0800  BP: 110/67 103/67 105/66   Pulse: 83 78 78   Resp: (!) 0 (!) 0 11   Temp:    97.6 F (36.4 C)  TempSrc:    Oral  SpO2: 94% 97% 97%   Weight:      Height:        Intake/Output Summary (Last 24 hours) at 07/17/16 1300 Last data filed at 07/17/16 0600  Gross per 24 hour  Intake          4461.96 ml  Output              850 ml  Net          3611.96 ml   Filed Weights   07/16/16 1234 07/16/16 1840 07/17/16 0342  Weight: 70.8 kg (156 lb) 74.8 kg (164 lb 14.5 oz) 77.7 kg (171 lb 4.8 oz)    Examination:  General exam: Appears calm and comfortable  Respiratory system: Clear to auscultation. Respiratory effort normal. Cardiovascular system: S1 & S2 heard, RRR.  No JVD, murmurs, rubs, gallops or clicks. No pedal edema. Gastrointestinal system: Abdomen is nondistended, soft and nontender. No organomegaly or masses felt. Normal bowel sounds heard. Central nervous system: Alert and oriented. No focal neurological deficits. Extremities: Symmetric 5 x 5 power. Skin: No rashes, lesions or ulcers Psychiatry: Judgement and insight appear normal. Mood & affect appropriate.   Data Reviewed: I have personally reviewed following labs and imaging studies  CBC:  Recent Labs Lab 07/16/16 1200 07/16/16 2253 07/17/16 0521  WBC 19.7* 31.3* 19.9*  NEUTROABS 18.9* 29.8*  --   HGB 13.3 11.4* 11.0*  HCT 38.2* 33.3* 32.3*  MCV 88.4 88.8 87.5  PLT 135* 90* 75*   Basic Metabolic Panel:  Recent Labs Lab 07/16/16 1200 07/16/16 2056 07/17/16 0521  NA 130* 138 137  K  3.7 3.3* 3.5  CL 96* 106 110  CO2 21* 23 21*  GLUCOSE 258* 90 86  BUN 41* 34* 29*  CREATININE 1.29* 1.12 0.92  CALCIUM 8.7* 7.7* 7.7*   GFR: Estimated Creatinine Clearance: 77.1 mL/min (by C-G formula based on SCr of 0.92 mg/dL). Liver Function Tests:  Recent Labs Lab 07/16/16 1200 07/16/16 2056  AST 41 28  ALT 27 23  ALKPHOS 110 118  BILITOT 0.8 1.0  PROT 6.5 5.5*  ALBUMIN 3.4* 2.6*   No results for input(s): LIPASE, AMYLASE in the last 168 hours. No results for input(s): AMMONIA in the last 168 hours. Coagulation Profile:  Recent Labs Lab 07/16/16 2056  INR 1.27   Cardiac Enzymes:  Recent Labs Lab 07/16/16 2056 07/17/16 0521  TROPONINI 0.25* 0.32*   BNP (last 3 results) No results for input(s): PROBNP in the last 8760 hours. HbA1C: No results for input(s): HGBA1C in the last 72 hours. CBG:  Recent Labs Lab 07/16/16 1743  GLUCAP 85   Lipid Profile: No results for input(s): CHOL, HDL, LDLCALC, TRIG, CHOLHDL, LDLDIRECT in the last 72 hours. Thyroid Function Tests: No results for input(s): TSH, T4TOTAL, FREET4, T3FREE, THYROIDAB in the last 72 hours. Anemia Panel: No results for input(s): VITAMINB12, FOLATE, FERRITIN, TIBC, IRON, RETICCTPCT in the last 72 hours. Urine analysis:    Component Value Date/Time   COLORURINE YELLOW 07/16/2016 1130   APPEARANCEUR TURBID (A) 07/16/2016 1130   LABSPEC 1.021 07/16/2016 1130   PHURINE 5.5 07/16/2016 1130   GLUCOSEU 100 (A) 07/16/2016 1130   HGBUR LARGE (A) 07/16/2016 1130   BILIRUBINUR NEGATIVE 07/16/2016 1130   BILIRUBINUR neg 03/30/2012 1543   KETONESUR 15 (A) 07/16/2016 1130   PROTEINUR 30 (A) 07/16/2016 1130   UROBILINOGEN 0.2 03/30/2012 1543   NITRITE POSITIVE (A) 07/16/2016 1130   LEUKOCYTESUR LARGE (A) 07/16/2016 1130   Sepsis Labs: '@LABRCNTIP' (procalcitonin:4,lacticidven:4)    Blood Culture (routine x 2)     Status: None (Preliminary result)   Collection Time: 07/16/16 12:25 PM  Result Value  Ref Range Status   Specimen Description BLOOD RIGHT ARM  Final   Special Requests BOTTLES DRAWN AEROBIC AND ANAEROBIC 5CC EACH  Final   Culture  Setup Time   Final   Culture GRAM NEGATIVE RODS  Final   Report Status PENDING  Incomplete  Blood Culture (routine x 2)     Status: None (Preliminary result)   Collection Time: 07/16/16 12:40 PM  Result Value Ref Range Status   Specimen Description BLOOD LEFT ARM  Final   Special Requests BOTTLES DRAWN AEROBIC AND ANAEROBIC 5CC EACH  Final   Culture  Setup Time   Final   Culture GRAM  NEGATIVE RODS  Final   Report Status PENDING  Incomplete  Blood Culture ID Panel (Reflexed)     Status: Abnormal   Collection Time: 07/16/16 12:40 PM  Result Value Ref Range Status   Enterococcus species NOT DETECTED NOT DETECTED Final   Listeria monocytogenes NOT DETECTED NOT DETECTED Final   Staphylococcus species NOT DETECTED NOT DETECTED Final   Staphylococcus aureus NOT DETECTED NOT DETECTED Final   Streptococcus species NOT DETECTED NOT DETECTED Final   Streptococcus agalactiae NOT DETECTED NOT DETECTED Final   Streptococcus pneumoniae NOT DETECTED NOT DETECTED Final   Streptococcus pyogenes NOT DETECTED NOT DETECTED Final   Acinetobacter baumannii NOT DETECTED NOT DETECTED Final   Enterobacteriaceae species DETECTED (A) NOT DETECTED Final   Enterobacter cloacae complex NOT DETECTED NOT DETECTED Final   Escherichia coli DETECTED (A) NOT DETECTED Final   Klebsiella oxytoca NOT DETECTED NOT DETECTED Final   Klebsiella pneumoniae NOT DETECTED NOT DETECTED Final   Proteus species NOT DETECTED NOT DETECTED Final   Serratia marcescens NOT DETECTED NOT DETECTED Final   Carbapenem resistance NOT DETECTED NOT DETECTED Final   Haemophilus influenzae NOT DETECTED NOT DETECTED Final   Neisseria meningitidis NOT DETECTED NOT DETECTED Final   Pseudomonas aeruginosa NOT DETECTED NOT DETECTED Final   Candida albicans NOT DETECTED NOT DETECTED Final   Candida glabrata  NOT DETECTED NOT DETECTED Final   Candida krusei NOT DETECTED NOT DETECTED Final   Candida parapsilosis NOT DETECTED NOT DETECTED Final   Candida tropicalis NOT DETECTED NOT DETECTED Final    Comment: Performed at [facility]      Radiology Studies: Ct Renal Stone Study Result Date: 07/16/2016 8 mm mid left ureteral calculus. Associated moderate left hydroureteronephrosis. Multiple small nonobstructing bilateral renal calculi measuring up to 3 mm. Mild posterior wedging at L1. Associated posterior thoracolumbar fixation extending from T10 through S1. Additional ancillary findings as above. Electronically Signed   By: Julian Hy M.D.   On: 07/16/2016 13:28      Scheduled Med . cefTRIAXone (ROCEPHIN)  IV  2 g Intravenous Q24H  . sodium chloride flush  3 mL Intravenous Q12H   Continuous Infusions: . sodium chloride 125 mL/hr at 07/17/16 0337  . phenylephrine (NEO-SYNEPHRINE) Adult infusion Stopped (07/17/16 0431)     LOS: 1 day    Time spent: 25 minutes  Greater than 50% of the time spent on counseling and coordinating the care.   Leisa Lenz, MD [facility] Pager [phone]  If 7PM-7AM, please contact night-coverage [email or web address] Password [email or web address] 07/17/2016, 1:00 PM

## 2016-07-17 NOTE — Progress Notes (Signed)
1 Day Post-Op Subjective: Patient reports feels weak. Tolerated clears for lunch and wants some regular food. Off pressors. Wants to keep foley a little longer.   Objective: Vital signs in last 24 hours: Temp:  [97.6 F (36.4 C)-101.2 F (38.4 C)] 97.6 F (36.4 C) (09/03 0800) Pulse Rate:  [78-120] 78 (09/03 0600) Resp:  [0-29] 11 (09/03 0600) BP: (75-115)/(51-77) 105/66 (09/03 0600) SpO2:  [91 %-100 %] 97 % (09/03 0600) Weight:  [74.8 kg (164 lb 14.5 oz)-77.7 kg (171 lb 4.8 oz)] 77.7 kg (171 lb 4.8 oz) (09/03 0342)  Intake/Output from previous day: 09/02 0701 - 09/03 0700 In: 4462 [P.O.:100; I.V.:3812; IV Piggyback:550] Out: 850 [Urine:850] Intake/Output this shift: No intake/output data recorded.  Physical Exam:  NAD Alert and oriented x 3 No focal deficits Urine clear   Lab Results:  Recent Labs  07/16/16 1200 07/16/16 2253 07/17/16 0521  HGB 13.3 11.4* 11.0*  HCT 38.2* 33.3* 32.3*   BMET  Recent Labs  07/16/16 2056 07/17/16 0521  NA 138 137  K 3.3* 3.5  CL 106 110  CO2 23 21*  GLUCOSE 90 86  BUN 34* 29*  CREATININE 1.12 0.92  CALCIUM 7.7* 7.7*    Recent Labs  07/16/16 2056  INR 1.27   No results for input(s): LABURIN in the last 72 hours. Results for orders placed or performed during the hospital encounter of 07/16/16  Blood Culture (routine x 2)     Status: None (Preliminary result)   Collection Time: 07/16/16 12:25 PM  Result Value Ref Range Status   Specimen Description BLOOD RIGHT ARM  Final   Special Requests BOTTLES DRAWN AEROBIC AND ANAEROBIC 5CC EACH  Final   Culture  Setup Time   Final    GRAM NEGATIVE RODS IN BOTH AEROBIC AND ANAEROBIC BOTTLES CRITICAL RESULT CALLED TO, READ BACK BY AND VERIFIED WITH: T PICKERING,PHARMD AT 1121 07/17/16 BY L BENFIELD Performed at [place]  Final   Report Status PENDING  Incomplete  Blood Culture (routine x 2)     Status: None (Preliminary result)    Collection Time: 07/16/16 12:40 PM  Result Value Ref Range Status   Specimen Description BLOOD LEFT ARM  Final   Special Requests BOTTLES DRAWN AEROBIC AND ANAEROBIC 5CC EACH  Final   Culture  Setup Time   Final    GRAM NEGATIVE RODS IN BOTH AEROBIC AND ANAEROBIC BOTTLES CRITICAL RESULT CALLED TO, READ BACK BY AND VERIFIED WITH: T PICKERING,PHARMD AT 1121 07/17/16 BY L BENFIELD Performed at [place]  Final   Report Status PENDING  Incomplete  Blood Culture ID Panel (Reflexed)     Status: Abnormal   Collection Time: 07/16/16 12:40 PM  Result Value Ref Range Status   Enterococcus species NOT DETECTED NOT DETECTED Final   Listeria monocytogenes NOT DETECTED NOT DETECTED Final   Staphylococcus species NOT DETECTED NOT DETECTED Final   Staphylococcus aureus NOT DETECTED NOT DETECTED Final   Streptococcus species NOT DETECTED NOT DETECTED Final   Streptococcus agalactiae NOT DETECTED NOT DETECTED Final   Streptococcus pneumoniae NOT DETECTED NOT DETECTED Final   Streptococcus pyogenes NOT DETECTED NOT DETECTED Final   Acinetobacter baumannii NOT DETECTED NOT DETECTED Final   Enterobacteriaceae species DETECTED (A) NOT DETECTED Final    Comment: CRITICAL RESULT CALLED TO, READ BACK BY AND VERIFIED WITH: T PICKERING,PHARMD AT 1121 07/17/16 BY L BENFIELD  Enterobacter cloacae complex NOT DETECTED NOT DETECTED Final   Escherichia coli DETECTED (A) NOT DETECTED Final    Comment: CRITICAL RESULT CALLED TO, READ BACK BY AND VERIFIED WITH: T PICKERING,PHARMD AT 1121 07/17/16 BY L BENFIELD    Klebsiella oxytoca NOT DETECTED NOT DETECTED Final   Klebsiella pneumoniae NOT DETECTED NOT DETECTED Final   Proteus species NOT DETECTED NOT DETECTED Final   Serratia marcescens NOT DETECTED NOT DETECTED Final   Carbapenem resistance NOT DETECTED NOT DETECTED Final   Haemophilus influenzae NOT DETECTED NOT DETECTED Final   Neisseria meningitidis NOT DETECTED NOT  DETECTED Final   Pseudomonas aeruginosa NOT DETECTED NOT DETECTED Final   Candida albicans NOT DETECTED NOT DETECTED Final   Candida glabrata NOT DETECTED NOT DETECTED Final   Candida krusei NOT DETECTED NOT DETECTED Final   Candida parapsilosis NOT DETECTED NOT DETECTED Final   Candida tropicalis NOT DETECTED NOT DETECTED Final    Comment: Performed at [facility]    Studies/Results: Ct Renal Stone Study  Result Date: 07/16/2016 CLINICAL DATA:  Left flank pain, fever, history of renal stones status post lithotripsy EXAM: CT ABDOMEN AND PELVIS WITHOUT CONTRAST TECHNIQUE: Multidetector CT imaging of the abdomen and pelvis was performed following the standard protocol without IV contrast. COMPARISON:  02/27/2014 FINDINGS: Lower chest: Trace left pleural effusion. Mild dependent atelectasis in the bilateral lung bases. Hepatobiliary: Mild hepatic steatosis. Gallbladder is unremarkable. No intrahepatic or extrahepatic ductal dilatation. Pancreas: Pancreatic atrophy. Spleen: Within normal limits. Adrenals/Urinary Tract: Adrenal glands are unremarkable. Multiple small bilateral renal calculi measuring up to 3 mm bilaterally. Moderate left hydroureteronephrosis. Associated 8 mm mid left ureteral calculus (series 2/ image 49). Bladder is within normal limits. Stomach/Bowel: Stomach is within normal limits. No evidence of bowel obstruction. Vascular/Lymphatic: Atherosclerotic calcifications of the abdominal aorta and branch vessels. No evidence of abdominal aortic aneurysm. No suspicious abdominopelvic lymphadenopathy. Reproductive: Prostate is notable for dystrophic calcifications. Other: No abdominopelvic ascites. Tiny fat containing right inguinal hernia (series 2/image 80). Musculoskeletal: Mild posterior wedging at L1 with associated posterior thoracolumbar fixation extending from T10 through S1. IMPRESSION: 8 mm mid left ureteral calculus. Associated moderate left hydroureteronephrosis. Multiple  small nonobstructing bilateral renal calculi measuring up to 3 mm. Mild posterior wedging at L1. Associated posterior thoracolumbar fixation extending from T10 through S1. Additional ancillary findings as above. Electronically Signed   By: Julian Hy M.D.   On: 07/16/2016 13:28  I reviewed the images.   Assessment/Plan:  -POD#1 left ureteral stent for left proximal stone, sepsis - cx's growing e coli. Appreciate excellent PCCM and hospitalist care. Will follow.    LOS: 1 day   Willistine Ferrall 07/17/2016, 12:38 PM

## 2016-07-18 DIAGNOSIS — L8990 Pressure ulcer of unspecified site, unspecified stage: Secondary | ICD-10-CM | POA: Insufficient documentation

## 2016-07-18 LAB — BASIC METABOLIC PANEL
Anion gap: 3 — ABNORMAL LOW (ref 5–15)
BUN: 22 mg/dL — AB (ref 6–20)
CO2: 24 mmol/L (ref 22–32)
CREATININE: 0.73 mg/dL (ref 0.61–1.24)
Calcium: 8.1 mg/dL — ABNORMAL LOW (ref 8.9–10.3)
Chloride: 112 mmol/L — ABNORMAL HIGH (ref 101–111)
GFR calc Af Amer: >60 mL/min (ref >60)
GLUCOSE: 125 mg/dL — AB (ref 65–99)
POTASSIUM: 3.3 mmol/L — AB (ref 3.5–5.1)
Sodium: 139 mmol/L (ref 135–145)

## 2016-07-18 LAB — CBC
HCT: 33.7 % — ABNORMAL LOW (ref 39.0–52.0)
Hemoglobin: 11.4 g/dL — ABNORMAL LOW (ref 13.0–17.0)
MCH: 29.8 pg (ref 26.0–34.0)
MCHC: 33.8 g/dL (ref 30.0–36.0)
MCV: 88.2 fL (ref 78.0–100.0)
PLATELETS: 91 10*3/uL — AB (ref 150–400)
RBC: 3.82 MIL/uL — AB (ref 4.22–5.81)
RDW: 15.8 % — ABNORMAL HIGH (ref 11.5–15.5)
WBC: 14.8 10*3/uL — ABNORMAL HIGH (ref 4.0–10.5)

## 2016-07-18 MED ORDER — POTASSIUM CHLORIDE CRYS ER 20 MEQ PO TBCR
40.0000 meq | EXTENDED_RELEASE_TABLET | Freq: Once | ORAL | Status: AC
  Administered 2016-07-18: 40 meq via ORAL
  Filled 2016-07-18: qty 2

## 2016-07-18 NOTE — Progress Notes (Signed)
Patient ID: Ethan Rose, male   DOB: 10/10/1946, 70 y.o.   MRN: [id number]  PROGRESS NOTE    Ethan Rose  [id number] DOB: 06/15/1946 DOA: 07/16/2016  PCP: Blanchie Serve, MD   Brief Narrative:  70 y.o. male with a past medical history of nephrolithiasis s/p lithotripsy x 4 in the past, prior cervical and lumbar spine surgeries, GERD who presented to the ED in [facility] for evaluation of two days of progressive left flank pain associated with fever up to 102F and generalized weakness.   CT scan stone protocol showed an 83m mid left ureteral calculus with moderate left sided hydroureteronephrosis.  Urology was consulted.  Code Sepsis was activated.  U/A showed infection (nitrite positive, large leukocytes, TNTC WBC, many bacteria) with mild AKI (BUN 41, Cr 1.29), leukocytosis of 19.7 and lactic acid level of 4.81.  Blood and urine cultures were sent.  The patient received IV Rocephin.  He received 2.25L of NS based on sepsis protocol.  [facility] left ureteral stent placement.  Post-operatively, [facility].    Assessment & Plan:   Principal Problem:   Severe sepsis with septic shock due to E.Coli bacteremia ([place] / Leukocytosis  - Sepsis criteria met on admission, source likely E.Coli bacteremia - Has required short course on pressor support (neosynephrine)  - Initially on vanco and zosyn but this was changed to Rocephin based on blood culture results which are growing E.Coli - Urine culture also growing E.Coli - Repeat blood cultures today to ensure clearance of bacteremia  - Leukocytosis is improving   Active Problems:   Pyelonephritis / Left ureteral stone - S/P stent placement      Thrombocytopenia - Due to sepsis - No reports of bleeding - Platelets improving     Acute kidney injury - Due to sepsis and pyelonephritis  -  Resolved with IV fluids     Hypokalemia - Due to sepsis - Supplemented   DVT prophylaxis: SCD's bilaterally  Code Status: full code  Family Communication: No family at the bedside this am Disposition Plan: transfer to telemetry floor today    Consultants:   GU  PCCM  Procedures:   Stent placement 07/16/16  Antimicrobials:   Vanco and zosyn stopped 07/17/2016   Rocephin 07/17/2016 -->    Subjective: No overnight events.  Objective: Vitals:   07/18/16 0700 07/18/16 0800 07/18/16 1013 07/18/16 1015  BP: 125/66 140/88  118/66  Pulse: 72 78  78  Resp: 20 (!) 22    Temp:  98.5 F (36.9 C)  98.6 F (37 C)  TempSrc:  Oral  Oral  SpO2: 98% 99%  97%  Weight:   81.6 kg (179 lb 14.3 oz)   Height:   '5\' 10"'  (1.778 m)     Intake/Output Summary (Last 24 hours) at 07/18/16 1620 Last data filed at 07/18/16 1500  Gross per 24 hour  Intake             3095 ml  Output             1650 ml  Net             1445 ml   Filed Weights   07/17/16 0342 07/18/16 0331 07/18/16 1013  Weight: 77.7 kg (171 lb 4.8 oz) 81.6 kg (179 lb 14.3 oz) 81.6 kg (179 lb 14.3 oz)  Examination:  General exam: Appears calm and comfortable, no distress  Respiratory system: No wheezing, no rhonchi  Cardiovascular system: S1 & S2 heard, Rate controlled  Gastrointestinal system: (+) BS< non tender Central nervous system: No focal neurological deficits. Extremities: No swelling, palpable pulses  Skin: warm, dry  Psychiatry: Normal mood and behavior    Data Reviewed: I have personally reviewed following labs and imaging studies  CBC:  Recent Labs Lab 07/16/16 1200 07/16/16 2253 07/17/16 0521 07/18/16 0853  WBC 19.7* 31.3* 19.9* 14.8*  NEUTROABS 18.9* 29.8*  --   --   HGB 13.3 11.4* 11.0* 11.4*  HCT 38.2* 33.3* 32.3* 33.7*  MCV 88.4 88.8 87.5 88.2  PLT 135* 90* 75* 91*   Basic Metabolic Panel:  Recent Labs Lab 07/16/16 1200 07/16/16 2056 07/17/16 0521 07/18/16 0853  NA 130* 138 137  139  K 3.7 3.3* 3.5 3.3*  CL 96* 106 110 112*  CO2 21* 23 21* 24  GLUCOSE 258* 90 86 125*  BUN 41* 34* 29* 22*  CREATININE 1.29* 1.12 0.92 0.73  CALCIUM 8.7* 7.7* 7.7* 8.1*   GFR: Estimated Creatinine Clearance: 88.7 mL/min (by C-G formula based on SCr of 0.8 mg/dL). Liver Function Tests:  Recent Labs Lab 07/16/16 1200 07/16/16 2056  AST 41 28  ALT 27 23  ALKPHOS 110 118  BILITOT 0.8 1.0  PROT 6.5 5.5*  ALBUMIN 3.4* 2.6*   No results for input(s): LIPASE, AMYLASE in the last 168 hours. No results for input(s): AMMONIA in the last 168 hours. Coagulation Profile:  Recent Labs Lab 07/16/16 2056  INR 1.27   Cardiac Enzymes:  Recent Labs Lab 07/16/16 2056 07/17/16 0521  TROPONINI 0.25* 0.32*   BNP (last 3 results) No results for input(s): PROBNP in the last 8760 hours. HbA1C:  Recent Labs  07/17/16 0521  HGBA1C 6.2*   CBG:  Recent Labs Lab 07/16/16 1743  GLUCAP 85   Lipid Profile: No results for input(s): CHOL, HDL, LDLCALC, TRIG, CHOLHDL, LDLDIRECT in the last 72 hours. Thyroid Function Tests: No results for input(s): TSH, T4TOTAL, FREET4, T3FREE, THYROIDAB in the last 72 hours. Anemia Panel: No results for input(s): VITAMINB12, FOLATE, FERRITIN, TIBC, IRON, RETICCTPCT in the last 72 hours. Urine analysis:    Component Value Date/Time   COLORURINE YELLOW 07/16/2016 1130   APPEARANCEUR TURBID (A) 07/16/2016 1130   LABSPEC 1.021 07/16/2016 1130   PHURINE 5.5 07/16/2016 1130   GLUCOSEU 100 (A) 07/16/2016 1130   HGBUR LARGE (A) 07/16/2016 1130   BILIRUBINUR NEGATIVE 07/16/2016 1130   BILIRUBINUR neg 03/30/2012 1543   KETONESUR 15 (A) 07/16/2016 1130   PROTEINUR 30 (A) 07/16/2016 1130   UROBILINOGEN 0.2 03/30/2012 1543   NITRITE POSITIVE (A) 07/16/2016 1130   LEUKOCYTESUR LARGE (A) 07/16/2016 1130   Sepsis Labs: '@LABRCNTIP' (procalcitonin:4,lacticidven:4)   Results for orders placed or performed during the hospital encounter of 07/16/16  Urine  culture     Status: Abnormal (Preliminary result)   Collection Time: 07/16/16 11:36 AM  Result Value Ref Range Status   Specimen Description URINE, CLEAN CATCH  Final   Special Requests NONE  Final   Culture >=100,000 COLONIES/mL ESCHERICHIA COLI (A)  Final   Report Status PENDING  Incomplete  Blood Culture (routine x 2)     Status: None (Preliminary result)   Collection Time: 07/16/16 12:25 PM  Result Value Ref Range Status   Specimen Description BLOOD RIGHT ARM  Final   Special Requests BOTTLES DRAWN AEROBIC AND ANAEROBIC 5CC EACH  Final   Culture  Setup Time   Final    GRAM NEGATIVE RODS IN BOTH AEROBIC AND ANAEROBIC BOTTLES CRITICAL RESULT CALLED TO, READ BACK BY AND VERIFIED WITH: T PICKERING,PHARMD AT 1121 07/17/16 BY L BENFIELD Performed at [place]  Final   Report Status PENDING  Incomplete  Blood Culture (routine x 2)     Status: Abnormal (Preliminary result)   Collection Time: 07/16/16 12:40 PM  Result Value Ref Range Status   Specimen Description BLOOD LEFT ARM  Final   Special Requests BOTTLES DRAWN AEROBIC AND ANAEROBIC 5CC EACH  Final   Culture  Setup Time   Final    GRAM NEGATIVE RODS IN BOTH AEROBIC AND ANAEROBIC BOTTLES CRITICAL RESULT CALLED TO, READ BACK BY AND VERIFIED WITH: T PICKERING,PHARMD AT 1121 07/17/16 BY L BENFIELD Performed at [place] (A)  Final   Report Status PENDING  Incomplete  Blood Culture ID Panel (Reflexed)     Status: Abnormal   Collection Time: 07/16/16 12:40 PM  Result Value Ref Range Status   Enterococcus species NOT DETECTED NOT DETECTED Final   Listeria monocytogenes NOT DETECTED NOT DETECTED Final   Staphylococcus species NOT DETECTED NOT DETECTED Final   Staphylococcus aureus NOT DETECTED NOT DETECTED Final   Streptococcus species NOT DETECTED NOT DETECTED Final   Streptococcus agalactiae NOT DETECTED NOT DETECTED Final   Streptococcus pneumoniae NOT  DETECTED NOT DETECTED Final   Streptococcus pyogenes NOT DETECTED NOT DETECTED Final   Acinetobacter baumannii NOT DETECTED NOT DETECTED Final   Enterobacteriaceae species DETECTED (A) NOT DETECTED Final    Comment: CRITICAL RESULT CALLED TO, READ BACK BY AND VERIFIED WITH: T PICKERING,PHARMD AT 1121 07/17/16 BY L BENFIELD    Enterobacter cloacae complex NOT DETECTED NOT DETECTED Final   Escherichia coli DETECTED (A) NOT DETECTED Final    Comment: CRITICAL RESULT CALLED TO, READ BACK BY AND VERIFIED WITH: T PICKERING,PHARMD AT 1121 07/17/16 BY L BENFIELD    Klebsiella oxytoca NOT DETECTED NOT DETECTED Final   Klebsiella pneumoniae NOT DETECTED NOT DETECTED Final   Proteus species NOT DETECTED NOT DETECTED Final   Serratia marcescens NOT DETECTED NOT DETECTED Final   Carbapenem resistance NOT DETECTED NOT DETECTED Final   Haemophilus influenzae NOT DETECTED NOT DETECTED Final   Neisseria meningitidis NOT DETECTED NOT DETECTED Final   Pseudomonas aeruginosa NOT DETECTED NOT DETECTED Final   Candida albicans NOT DETECTED NOT DETECTED Final   Candida glabrata NOT DETECTED NOT DETECTED Final   Candida krusei NOT DETECTED NOT DETECTED Final   Candida parapsilosis NOT DETECTED NOT DETECTED Final   Candida tropicalis NOT DETECTED NOT DETECTED Final    Comment: Performed at [facility]     Radiology Studies: Ct Renal Stone Study Result Date: 07/16/2016 8 mm mid left ureteral calculus. Associated moderate left hydroureteronephrosis. Multiple small nonobstructing bilateral renal calculi measuring up to 3 mm. Mild posterior wedging at L1. Associated posterior thoracolumbar fixation extending from T10 through S1. Additional ancillary findings as above. Electronically Signed   By: Julian Hy M.D.   On: 07/16/2016 13:28      Scheduled Med . cefTRIAXone (ROCEPHIN)  IV  2 g Intravenous Q24H  . potassium chloride  40 mEq Oral Once  . sodium chloride flush  3 mL Intravenous Q12H    Continuous Infusions:     LOS: 2 days    Time spent: 25 minutes  Greater than 50% of the time spent on counseling and coordinating the care.   Leisa Lenz, MD [facility] Pager [phone]  If 7PM-7AM, please contact night-coverage [email or web address] Password [email or web address] 07/18/2016, 4:20 PM

## 2016-07-18 NOTE — Progress Notes (Addendum)
S: Pt feeling better. Looks better today, but still quite weak in his legs. He hasn't ambulated, but will with PT later. He wants to keep foley until he's ambulatory.   O:  Vitals:   07/21/16 2112 07/22/16 0445  BP: 140/65 (!) 143/70  Pulse: 83 71  Resp: 20 18  Temp: 98.2 F (36.8 C) 98.7 F (37.1 C)   Urine and blood Cx's are positive - sensitivities pending. WBC better and Cr normal.  NAD In bed, foley catheter, urine clear  A/P:  POD#2 left ureteral stent for left proximal stone, sepsis - cx's growing e coli. Appreciate excellent PCCM and hospitalist care. Will follow. Pt to work with PT and then remove foley once ambulatory.

## 2016-07-19 ENCOUNTER — Encounter (HOSPITAL_COMMUNITY): Payer: Self-pay | Admitting: Urology

## 2016-07-19 LAB — CULTURE, BLOOD (ROUTINE X 2)

## 2016-07-19 LAB — CBC
HCT: 35.3 % — ABNORMAL LOW (ref 39.0–52.0)
HEMOGLOBIN: 12 g/dL — AB (ref 13.0–17.0)
MCH: 29.9 pg (ref 26.0–34.0)
MCHC: 34 g/dL (ref 30.0–36.0)
MCV: 88 fL (ref 78.0–100.0)
Platelets: 116 10*3/uL — ABNORMAL LOW (ref 150–400)
RBC: 4.01 MIL/uL — ABNORMAL LOW (ref 4.22–5.81)
RDW: 15.5 % (ref 11.5–15.5)
WBC: 12.9 10*3/uL — ABNORMAL HIGH (ref 4.0–10.5)

## 2016-07-19 LAB — URINE CULTURE

## 2016-07-19 NOTE — Clinical Social Work Placement (Signed)
CSW sent information out to [facility], awaiting call back from [facility] with decision. Also awaiting pre-screen from CIR.     Raynaldo Opitz, [facility] Clinical Social Worker cell #: [phone]    CLINICAL SOCIAL WORK PLACEMENT  NOTE  Date:  07/19/2016  Patient Details  Name: Ethan Rose MRN: [id number] Date of Birth: Nov 14, 1946  Clinical Social Work is seeking post-discharge placement for this patient at the [place] level of care (*CSW will initial, date and re-position this form in  chart as items are completed):  Yes   Patient/family provided with [place] Work Department's list of facilities offering this level of care within the geographic area requested by the patient (or if unable, by the patient's family).  Yes   Patient/family informed of their freedom to choose among providers that offer the needed level of care, that participate in Medicare, Medicaid or managed care program needed by the patient, have an available bed and are willing to accept the patient.  Yes   Patient/family informed of [facility]'s ownership interest in [facility] and [facility], as well as of the fact that they are under no obligation to receive care at these facilities.  PASRR submitted to EDS on       PASRR number received on       Existing PASRR number confirmed on 07/19/16     FL2 transmitted to all facilities in geographic area requested by pt/family on 07/19/16     FL2 transmitted to all facilities within larger geographic area on       Patient informed that his/her managed care company has contracts with or will negotiate with certain facilities, including the following:            Patient/family informed of bed offers received.  Patient chooses bed at       Physician recommends and patient chooses bed at      Patient to be transferred to   on  .  Patient to be transferred to facility by        Patient family notified on   of transfer.  Name of family member notified:        PHYSICIAN       Additional Comment:    _______________________________________________ Standley Brooking, LCSW 07/19/2016, 2:25 PM

## 2016-07-19 NOTE — NC FL2 (Signed)
[place] LEVEL OF CARE SCREENING TOOL     IDENTIFICATION  Patient Name: Ethan Rose Birthdate: 03/15/1946 Sex: male Admission Date (Current Location): 07/16/2016  [facility] and [place] Number:  Herbalist and Address:  [facility],  [place] [address], [place]      Provider Number: [id number]  Attending Physician Name and Address:  Robbie Lis, MD  Relative Name and Phone Number:       Current Level of Care: Hospital Recommended Level of Care: [place] Prior Approval Number:    Date Approved/Denied:   PASRR Number: [id number]  Discharge Plan: Home    Current Diagnoses: Patient Active Problem List   Diagnosis Date Noted  . Pressure ulcer 07/18/2016  . E coli bacteremia   . Leukocytosis   . Sepsis ([place]) 07/16/2016  . Severe sepsis with septic shock ([place]) 07/16/2016  . Pyelonephritis 07/16/2016  . Left ureteral stone 07/16/2016  . Septic shock ([place]) 07/16/2016  . Cervical myelopathy ([place]) 03/18/2016  . 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 Other malaise and fatigue 05/14/2014  . Health maintenance examination 04/29/2013  . Weakness of both legs 04/29/2013  . Leg cramps 04/29/2013  . Elevated blood pressure reading without diagnosis of hypertension 03/30/2012  . Dizziness 03/30/2012    Orientation RESPIRATION BLADDER Height & Weight     Self, Time, Situation, Place  Normal Indwelling catheter Weight: 179 lb 0.2 oz (81.2 kg) Height:  5\' 10"  (177.8 cm)  BEHAVIORAL SYMPTOMS/MOOD NEUROLOGICAL BOWEL NUTRITION STATUS      Continent Diet (Regular)  AMBULATORY STATUS COMMUNICATION OF NEEDS Skin   Extensive Assist Verbally PU Stage and Appropriate Care (Pressure Ulcer 07/18/16 Stage I -  Intact skin with non-blanchable redness of a localized area usually over a bony prominence. 7x4cm (left  buttock), )   PressureUlcer09/04/17StageI-Intactskinwithnon-blanchablerednessofalocalizedareausuallyoverabonyprominence.3.5x2.5cm (right lateral heel)  Incision(Closed)09/02/17Perineum               Personal Care Assistance Level of Assistance  Bathing, Dressing Bathing Assistance: Limited assistance   Dressing Assistance: Limited assistance     Functional Limitations [place]  PT (By licensed PT), OT (By licensed OT)     PT Frequency: 5 OT Frequency: 5            Contractures      Additional Factors Info  Code Status, Allergies Code Status Info: Fullcode Allergies Info: NKDA           Current Medications (07/19/2016):  This is the current hospital active medication list Current Facility-Administered Medications  Medication Dose Route Frequency Provider Last Rate Last Dose  . 0.9 %  sodium chloride infusion  250 mL Intravenous PRN [name], MD      . acetaminophen (TYLENOL) tablet 650 mg  650 mg Oral Q4H PRN [name], MD      . cefTRIAXone (ROCEPHIN) 2 g in dextrose 5 % 50 mL IVPB  2 g Intravenous Q24H Robbie Lis, MD   2 g at 07/19/16 1255  . HYDROmorphone (DILAUDID) injection 1 mg  1 mg Intravenous Q2H PRN [name], MD      . ondansetron [facility]) injection 4 mg  4 mg Intravenous Q6H PRN [name], MD      . oxyCODONE-acetaminophen (PERCOCET/ROXICET) 5-325 MG per tablet  1 tablet  1 tablet Oral Q4H PRN [name], MD      . sodium chloride flush (NS) 0.9 % injection 3 mL  3 mL Intravenous Q12H Lily Kocher, MD   3 mL at 07/18/16 2041     Discharge Medications: Please see discharge summary for a list of discharge medications.  Relevant Imaging Results:  Relevant Lab Results:   Additional Information SSN: [id number]  Standley Brooking, LCSW

## 2016-07-19 NOTE — Clinical Social Work Note (Signed)
Clinical Social Work Assessment  Patient Details  Name: Ethan Rose MRN: [id number] Date of Birth: [birth year]  Date of referral:  07/19/16               Reason for consult:  Facility Placement                Permission sought to share information with:  Chartered certified accountant granted to share information::  Yes, Verbal Permission Granted  Name::        Agency::     Relationship::     Contact Information:     Housing/Transportation Living arrangements for the past 2 months:  Single Family Home Source of Information:  Patient Patient Interpreter Needed:  None Criminal Activity/Legal Involvement Pertinent to Current Situation/Hospitalization:  No - Comment as needed Significant Relationships:  Adult Children Lives with:  Self Do you feel safe going back to the place where you live?  No Need for family participation in patient care:  No (Coment)  Care giving concerns:  CSW reviewed PT evaluation recommending CIR vs SNF at discharge.    Social Worker assessment / plan:  CSW spoke with patient re: discharge planning - patient states that he has been to [facility] twice in the past and would prefer to go there if possible. CSW reviewed PT evaluation recommending CIR - patient is agreeable to have CIR evaluate patient for admission - CSW noted CIR pre-screen order is in.   Employment status:    Insurance information:  Medicare PT Recommendations:  Inpatient Rehab Consult, [place] / Referral to community resources:  [place]  Patient/Family's Response to care:  Patient states that he has been very pleased with the care he's been receiving here at [facility] & is glad that he came into the hospital when he did as he admitted with severe sepsis.   Patient/Family's Understanding of and Emotional Response to Diagnosis, Current Treatment, and Prognosis:  Patient states that he started with a "dull pain" in his kidneys (he  states that he has had kidney stones in the past so is familiar with that pain). Patient is surprised with how weak he has gotten over the past couple days and feels very motivated to work with therapy to get stronger before returning home.   Emotional Assessment Appearance:  Appears stated age Attitude/Demeanor/Rapport:    Affect (typically observed):    Orientation:  Oriented to Self, Oriented to Place, Oriented to  Time, Oriented to Situation Alcohol / Substance use:    Psych involvement (Current and /or in the community):     Discharge Needs  Concerns to be addressed:    Readmission within the last 30 days:    Current discharge risk:    Barriers to Discharge:      Standley Brooking, LCSW 07/19/2016, 2:20 PM

## 2016-07-19 NOTE — Progress Notes (Addendum)
Patient ID: Ethan Rose, male   DOB: 1946-09-09, 70 y.o.   MRN: [id number]  PROGRESS NOTE    Ethan Rose  [id number] DOB: Aug 28, 1946 DOA: 07/16/2016  PCP: Blanchie Serve, MD   Brief Narrative:  70 y.o. male with a past medical history of nephrolithiasis s/p lithotripsy x 4 in the past, prior cervical and lumbar spine surgeries, GERD who presented to the ED in [facility] for evaluation of two days of progressive left flank pain associated with fever up to 102F and generalized weakness.   CT scan stone protocol showed an 62m mid left ureteral calculus with moderate left sided hydroureteronephrosis.  Urology was consulted.  Code Sepsis was activated.  U/A showed infection (nitrite positive, large leukocytes, TNTC WBC, many bacteria) with mild AKI (BUN 41, Cr 1.29), leukocytosis of 19.7 and lactic acid level of 4.81.  Blood and urine cultures were sent.  The patient received IV Rocephin.  He received 2.25L of NS based on sepsis protocol.  [facility] left ureteral stent placement.  [facility], [facility]   Transferred to telemetry 07/18/2016.  Assessment & Plan:   Principal Problem:   Severe sepsis with septic shock due to E.Coli bacteremia (HSunset / Leukocytosis  - Sepsis criteria met on admission, source likely E.Coli bacteremia and E.Coli UTI - Pt has required short course on pressor support (neosynephrine) after stent placement  - Initially on vanco and zosyn but this was changed to Rocephin based on blood culture results which are growing E.Coli sens to Rocephin  - Urine culture also growing E.Coli - Repeat blood cultures 9/4 pending - Leukocytosis is improving - CBC pending this am   Active Problems:   Pyelonephritis / Left ureteral stone - S/P stent placement   - Appreciate GU following     Thrombocytopenia - Due to  sepsis - No reports of bleeding - Platelets improving, 75 --> 91    Normocytic anemia - Hemoglobin stable at 11.4    Acute kidney injury - Due to sepsis and pyelonephritis  - Resolved with IV fluids     Hypokalemia - Due to sepsis - Supplemented  - BMP pending this am    Stage 1 pressure ulcer - Over sacrum, per RN care   DVT prophylaxis: SCD's bilaterally  Code Status: full code  Family Communication: No family at the bedside this am Disposition Plan: Follow-up on repeat blood culture results (9/4); home once we know those results to make sure bacteremia cleared (hopefully by 9/7)   Consultants:   GU  PCCM  Procedures:   Stent placement 07/16/16  Antimicrobials:   Vanco and zosyn stopped 07/17/2016   Rocephin 07/17/2016 -->    Subjective: No overnight events.  Objective: Vitals:   07/18/16 1013 07/18/16 1015 07/18/16 2030 07/19/16 0544  BP:  118/66 128/71 (!) 142/76  Pulse:  78 85 78  Resp:   16 18  Temp:  98.6 F (37 C) 99.1 F (37.3 C) 98.3 F (36.8 C)  TempSrc:  Oral Oral Oral  SpO2:  97% 98% 93%  Weight: 81.6 kg (179 lb 14.3 oz)   81.2 kg (179 lb 0.2 oz)  Height: '5\' 10"'  (1.778 m)       Intake/Output Summary (Last 24 hours) at 07/19/16 1053 Last data filed at 07/19/16 0947  Gross per 24 hour  Intake  855 ml  Output             2800 ml  Net            -1945 ml   Filed Weights   07/18/16 0331 07/18/16 1013 07/19/16 0544  Weight: 81.6 kg (179 lb 14.3 oz) 81.6 kg (179 lb 14.3 oz) 81.2 kg (179 lb 0.2 oz)    Examination:  General exam: no distress  Respiratory system: No wheezing, no rhonchi  Cardiovascular system: S1 & S2 heard, RRR Gastrointestinal system: (+) BS, Nontender, nondistended Central nervous system: No focal neurological deficits. Extremities: No edema, pulses palpable Skin: No ulcers or lesions Psychiatry: Normal mood and behavior    Data Reviewed: I have personally reviewed following labs and imaging  studies  CBC:  Recent Labs Lab 07/16/16 1200 07/16/16 2253 07/17/16 0521 07/18/16 0853  WBC 19.7* 31.3* 19.9* 14.8*  NEUTROABS 18.9* 29.8*  --   --   HGB 13.3 11.4* 11.0* 11.4*  HCT 38.2* 33.3* 32.3* 33.7*  MCV 88.4 88.8 87.5 88.2  PLT 135* 90* 75* 91*   Basic Metabolic Panel:  Recent Labs Lab 07/16/16 1200 07/16/16 2056 07/17/16 0521 07/18/16 0853  NA 130* 138 137 139  K 3.7 3.3* 3.5 3.3*  CL 96* 106 110 112*  CO2 21* 23 21* 24  GLUCOSE 258* 90 86 125*  BUN 41* 34* 29* 22*  CREATININE 1.29* 1.12 0.92 0.73  CALCIUM 8.7* 7.7* 7.7* 8.1*   GFR: Estimated Creatinine Clearance: 88.7 mL/min (by C-G formula based on SCr of 0.8 mg/dL). Liver Function Tests:  Recent Labs Lab 07/16/16 1200 07/16/16 2056  AST 41 28  ALT 27 23  ALKPHOS 110 118  BILITOT 0.8 1.0  PROT 6.5 5.5*  ALBUMIN 3.4* 2.6*   No results for input(s): LIPASE, AMYLASE in the last 168 hours. No results for input(s): AMMONIA in the last 168 hours. Coagulation Profile:  Recent Labs Lab 07/16/16 2056  INR 1.27   Cardiac Enzymes:  Recent Labs Lab 07/16/16 2056 07/17/16 0521  TROPONINI 0.25* 0.32*   BNP (last 3 results) No results for input(s): PROBNP in the last 8760 hours. HbA1C:  Recent Labs  07/17/16 0521  HGBA1C 6.2*   CBG:  Recent Labs Lab 07/16/16 1743  GLUCAP 85   Lipid Profile: No results for input(s): CHOL, HDL, LDLCALC, TRIG, CHOLHDL, LDLDIRECT in the last 72 hours. Thyroid Function Tests: No results for input(s): TSH, T4TOTAL, FREET4, T3FREE, THYROIDAB in the last 72 hours. Anemia Panel: No results for input(s): VITAMINB12, FOLATE, FERRITIN, TIBC, IRON, RETICCTPCT in the last 72 hours. Urine analysis:    Component Value Date/Time   COLORURINE YELLOW 07/16/2016 1130   APPEARANCEUR TURBID (A) 07/16/2016 1130   LABSPEC 1.021 07/16/2016 1130   PHURINE 5.5 07/16/2016 1130   GLUCOSEU 100 (A) 07/16/2016 1130   HGBUR LARGE (A) 07/16/2016 1130   BILIRUBINUR NEGATIVE  07/16/2016 1130   BILIRUBINUR neg 03/30/2012 1543   KETONESUR 15 (A) 07/16/2016 1130   PROTEINUR 30 (A) 07/16/2016 1130   UROBILINOGEN 0.2 03/30/2012 1543   NITRITE POSITIVE (A) 07/16/2016 1130   LEUKOCYTESUR LARGE (A) 07/16/2016 1130   Sepsis Labs: '@LABRCNTIP' (procalcitonin:4,lacticidven:4)   Results for orders placed or performed during the hospital encounter of 07/16/16  Urine culture     Status: Abnormal   Collection Time: 07/16/16 11:36 AM  Result Value Ref Range Status   Specimen Description URINE, CLEAN CATCH  Final   Special Requests NONE  Final   Culture >=100,000 COLONIES/mL  ESCHERICHIA COLI (A)  Final   Report Status 07/19/2016 FINAL  Final   Organism ID, Bacteria ESCHERICHIA COLI (A)  Final      Susceptibility   Escherichia coli - MIC*    AMPICILLIN <=2 SENSITIVE Sensitive     CEFAZOLIN <=4 SENSITIVE Sensitive     CEFTRIAXONE <=1 SENSITIVE Sensitive     CIPROFLOXACIN <=0.25 SENSITIVE Sensitive     GENTAMICIN <=1 SENSITIVE Sensitive     IMIPENEM <=0.25 SENSITIVE Sensitive     NITROFURANTOIN <=16 SENSITIVE Sensitive     TRIMETH/SULFA <=20 SENSITIVE Sensitive     AMPICILLIN/SULBACTAM <=2 SENSITIVE Sensitive     PIP/TAZO <=4 SENSITIVE Sensitive     Extended ESBL NEGATIVE Sensitive     * >=100,000 COLONIES/mL ESCHERICHIA COLI  Blood Culture (routine x 2)     Status: None (Preliminary result)   Collection Time: 07/16/16 12:25 PM  Result Value Ref Range Status   Specimen Description BLOOD RIGHT ARM  Final   Special Requests BOTTLES DRAWN AEROBIC AND ANAEROBIC 5CC EACH  Final   Culture  Setup Time   Final    GRAM NEGATIVE RODS IN BOTH AEROBIC AND ANAEROBIC BOTTLES CRITICAL RESULT CALLED TO, READ BACK BY AND VERIFIED WITH: T PICKERING,PHARMD AT 1121 07/17/16 BY L BENFIELD Performed at [place]  Final   Report Status PENDING  Incomplete  Blood Culture (routine x 2)     Status: Abnormal (Preliminary result)   Collection Time:  07/16/16 12:40 PM  Result Value Ref Range Status   Specimen Description BLOOD LEFT ARM  Final   Special Requests BOTTLES DRAWN AEROBIC AND ANAEROBIC 5CC EACH  Final   Culture  Setup Time   Final    GRAM NEGATIVE RODS IN BOTH AEROBIC AND ANAEROBIC BOTTLES CRITICAL RESULT CALLED TO, READ BACK BY AND VERIFIED WITH: T PICKERING,PHARMD AT 1121 07/17/16 BY L BENFIELD Performed at [place] (A)  Final   Report Status PENDING  Incomplete  Blood Culture ID Panel (Reflexed)     Status: Abnormal   Collection Time: 07/16/16 12:40 PM  Result Value Ref Range Status   Enterococcus species NOT DETECTED NOT DETECTED Final   Listeria monocytogenes NOT DETECTED NOT DETECTED Final   Staphylococcus species NOT DETECTED NOT DETECTED Final   Staphylococcus aureus NOT DETECTED NOT DETECTED Final   Streptococcus species NOT DETECTED NOT DETECTED Final   Streptococcus agalactiae NOT DETECTED NOT DETECTED Final   Streptococcus pneumoniae NOT DETECTED NOT DETECTED Final   Streptococcus pyogenes NOT DETECTED NOT DETECTED Final   Acinetobacter baumannii NOT DETECTED NOT DETECTED Final   Enterobacteriaceae species DETECTED (A) NOT DETECTED Final    Comment: CRITICAL RESULT CALLED TO, READ BACK BY AND VERIFIED WITH: T PICKERING,PHARMD AT 1121 07/17/16 BY L BENFIELD    Enterobacter cloacae complex NOT DETECTED NOT DETECTED Final   Escherichia coli DETECTED (A) NOT DETECTED Final    Comment: CRITICAL RESULT CALLED TO, READ BACK BY AND VERIFIED WITH: T PICKERING,PHARMD AT 1121 07/17/16 BY L BENFIELD    Klebsiella oxytoca NOT DETECTED NOT DETECTED Final   Klebsiella pneumoniae NOT DETECTED NOT DETECTED Final   Proteus species NOT DETECTED NOT DETECTED Final   Serratia marcescens NOT DETECTED NOT DETECTED Final   Carbapenem resistance NOT DETECTED NOT DETECTED Final   Haemophilus influenzae NOT DETECTED NOT DETECTED Final   Neisseria meningitidis NOT DETECTED NOT DETECTED Final    Pseudomonas aeruginosa NOT DETECTED  NOT DETECTED Final   Candida albicans NOT DETECTED NOT DETECTED Final   Candida glabrata NOT DETECTED NOT DETECTED Final   Candida krusei NOT DETECTED NOT DETECTED Final   Candida parapsilosis NOT DETECTED NOT DETECTED Final   Candida tropicalis NOT DETECTED NOT DETECTED Final    Comment: Performed at [facility]  Culture, blood (routine x 2)     Status: None (Preliminary result)   Collection Time: 07/18/16  6:10 PM  Result Value Ref Range Status   Specimen Description   Final    BLOOD RIGHT HAND Performed at [facility]    Special Requests NONE  Final   Culture PENDING  Incomplete   Report Status PENDING  Incomplete  Culture, blood (routine x 2)     Status: None (Preliminary result)   Collection Time: 07/18/16  6:10 PM  Result Value Ref Range Status   Specimen Description   Final    BLOOD BLOOD LEFT ARM Performed at [facility]    Special Requests NONE  Final   Culture PENDING  Incomplete   Report Status PENDING  Incomplete     Radiology Studies: Ct Renal Stone Study Result Date: 07/16/2016 8 mm mid left ureteral calculus. Associated moderate left hydroureteronephrosis. Multiple small nonobstructing bilateral renal calculi measuring up to 3 mm. Mild posterior wedging at L1. Associated posterior thoracolumbar fixation extending from T10 through S1. Additional ancillary findings as above. Electronically Signed   By: Julian Hy M.D.   On: 07/16/2016 13:28    Scheduled Med . cefTRIAXone (ROCEPHIN)  IV  2 g Intravenous Q24H  . sodium chloride flush  3 mL Intravenous Q12H   Continuous Infusions:     LOS: 3 days    Time spent: 25 minutes  Greater than 50% of the time spent on counseling and coordinating the care.   Leisa Lenz, MD [facility] Pager [phone]  If 7PM-7AM, please contact night-coverage [email or web address] Password [email or web address] 07/19/2016, 10:53 AM

## 2016-07-19 NOTE — Evaluation (Signed)
Physical Therapy Evaluation Patient Details Name: Ethan Rose MRN: [id number] DOB: [birth year] Today's Date: 07/19/2016   History of Present Illness  70 y.o. male with a past medical history of nephrolithiasis s/p lithotripsy x 4 in the past, hx of T10-iliac fusion in May 2016 and C4-5, C5-6, C6-7 ACDF in May 2017, GERD and admitted with severe sepsis with septic shock due to E.Coli bacteremia   Clinical Impression  Pt admitted with above diagnosis. Pt currently with functional limitations due to the deficits listed below (see PT Problem List).  Pt will benefit from skilled PT to increase their independence and safety with mobility to allow discharge to the venue listed below.  Pt presents with generalized weakness with LEs weaker then UEs and unable to stand today.  Pt assisted with performing lateral/scoot transfer for OOB to recliner.  Pt reports he feels he was just starting to function better from cervical spine surgery and considers this a set back, "like starting all over again."  Pt very motivated and agreeable to post acute rehab venue prior to home.     Follow Up Recommendations CIR (if not CIR then SNF)    Equipment Recommendations  Wheelchair (measurements PT);Wheelchair cushion (measurements PT)    Recommendations for Other Services       Precautions / Restrictions Precautions Precautions: Fall      Mobility  Bed Mobility Overal bed mobility: Needs Assistance Bed Mobility: Supine to Sit     Supine to sit: HOB elevated;Mod assist     General bed mobility comments: assist for LEs over EOB, pt able to self assist trunk upright by elevating HOB and using bed rail  Transfers Overall transfer level: Needs assistance Equipment used: None Transfers: Lateral/Scoot Transfers          Lateral/Scoot Transfers: Min assist General transfer comment: due to LE weakness and attempts to use stedy earlier with nursing, decided safest transfer at this time would be  lateral/scoot transfer, recliner with drop arm placed bedside bed and pt educated on opposite head/hip technique, bed would not elevate so assisted pt with scoot over armrest portion  Ambulation/Gait Ambulation/Gait assistance:  (not safe to ambulate at this time due to weakness)              Stairs            Wheelchair Mobility    Modified Rankin (Stroke Patients Only)       Balance Overall balance assessment: Needs assistance Sitting-balance support: No upper extremity supported;Feet supported Sitting balance-Leahy Scale: Fair                                       Pertinent Vitals/Pain Pain Assessment: No/denies pain    Home Living Family/patient expects to be discharged to:: Skilled nursing facility Living Arrangements: Alone               Additional Comments: has 3:1 in shower and another over toilet; has high bed, some piece of sheeting on first two steps as ramp, so leaves one step up at top, uses rollator, wears AFOs in community but none in house, plans to d/c to rehab at this time    Prior Function Level of Independence: Independent with assistive device(s)         Comments: states has worked hard in PT from [facility] after both spinal surgeries, to HHPT with lots of HEP with stationary  bike, leg and arm weights over past year, able to ambulate shorter distances with rollator; no longer drives since cervical spine surgery     Hand Dominance        Extremity/Trunk Assessment   Upper Extremity Assessment: Generalized weakness           Lower Extremity Assessment: RLE deficits/detail;LLE deficits/detail RLE Deficits / Details: hx of bilateral foot drop, reports weaker hip then knee on R, grossly 2/5 throughout as pt barely able to move against gravity in supine LLE Deficits / Details: hx of bilateral foot drop, reports weaker knee then hip on L, grossly 2/5 throughout as pt barely able to move against gravity in  supine     Communication   Communication: No difficulties  Cognition Arousal/Alertness: Awake/alert Behavior During Therapy: WFL for tasks assessed/performed Overall Cognitive Status: Within Functional Limits for tasks assessed                      General Comments      Exercises Other Exercises Other Exercises: reviewed quad sets, glut sets and attempting to perform heel slides (also with sheet to assist) (since unable without assist discussed contractions at least until in therapy for PT to assist)      Assessment/Plan    PT Assessment Patient needs continued PT services  PT Diagnosis Difficulty walking;Generalized weakness   PT Problem List Decreased strength;Decreased activity tolerance;Decreased balance;Decreased knowledge of use of DME;Decreased coordination;Decreased mobility  PT Treatment Interventions Gait training;DME instruction;Functional mobility training;Balance training;Therapeutic activities;Therapeutic exercise;Patient/family education;Neuromuscular re-education;Wheelchair mobility training   PT Goals (Current goals can be found in the Care Plan section) Acute Rehab PT Goals Patient Stated Goal: get stronger and ambulate again PT Goal Formulation: With patient Time For Goal Achievement: 08/02/16 Potential to Achieve Goals: Good    Frequency Min 4X/week   Barriers to discharge        Co-evaluation               End of Session   Activity Tolerance: Patient limited by fatigue Patient left: with call bell/phone within reach;in chair (pt very aware to call for assist back to bed) Nurse Communication: Mobility status         Time: [id number] PT Time Calculation (min) (ACUTE ONLY): 33 min   Charges:   PT Evaluation $PT Eval Moderate Complexity: 1 Procedure PT Treatments $Therapeutic Activity: 8-22 mins   PT G Codes:        Deniz Hannan,KATHrine E 07/19/2016, 12:34 PM Carmelia Bake, PT, DPT 07/19/2016 Pager: [phone]

## 2016-07-19 NOTE — Care Management Note (Signed)
Case Management Note  Patient Details  Name: Ethan Rose MRN: [id number] Date of Birth: Dec 01, 1945  Subjective/Objective:  70 y/o m admitted w/Severe sepsis. From home.                  Action/Plan:d/c plan home.   Expected Discharge Date:                  Expected Discharge Plan:  Home/Self Care  In-House Referral:     Discharge planning Services  CM Consult  Post Acute Care Choice:    Choice offered to:     DME Arranged:    DME Agency:     HH Arranged:    HH Agency:     Status of Service:  In process, will continue to follow  If discussed at Long Length of Stay Meetings, dates discussed:    Additional Comments:  Dessa Phi, RN 07/19/2016, 11:58 AM

## 2016-07-19 NOTE — Progress Notes (Signed)
Rehab Admissions Coordinator Note:  Patient was screened by Cleatrice Burke for appropriateness for an Inpatient Acute Rehab Consult per PT recommendation.  At this time, we are recommending await further progress with therapy for pt not yet at a level to tolerate inpt rehab level therapies. Recommend OT eval also. I will follow. Cleatrice Burke 07/19/2016, 3:21 PM  I can be reached at [phone].

## 2016-07-20 DIAGNOSIS — L8990 Pressure ulcer of unspecified site, unspecified stage: Secondary | ICD-10-CM

## 2016-07-20 DIAGNOSIS — N201 Calculus of ureter: Secondary | ICD-10-CM

## 2016-07-20 DIAGNOSIS — N12 Tubulo-interstitial nephritis, not specified as acute or chronic: Secondary | ICD-10-CM

## 2016-07-20 DIAGNOSIS — D72829 Elevated white blood cell count, unspecified: Secondary | ICD-10-CM

## 2016-07-20 LAB — BASIC METABOLIC PANEL
ANION GAP: 7 (ref 5–15)
BUN: 15 mg/dL (ref 6–20)
CALCIUM: 7.9 mg/dL — AB (ref 8.9–10.3)
CO2: 29 mmol/L (ref 22–32)
Chloride: 102 mmol/L (ref 101–111)
Creatinine, Ser: 0.59 mg/dL — ABNORMAL LOW (ref 0.61–1.24)
Glucose, Bld: 124 mg/dL — ABNORMAL HIGH (ref 65–99)
Potassium: 3.2 mmol/L — ABNORMAL LOW (ref 3.5–5.1)
SODIUM: 138 mmol/L (ref 135–145)

## 2016-07-20 LAB — CBC
HCT: 33.5 % — ABNORMAL LOW (ref 39.0–52.0)
Hemoglobin: 11.6 g/dL — ABNORMAL LOW (ref 13.0–17.0)
MCH: 29.7 pg (ref 26.0–34.0)
MCHC: 34.6 g/dL (ref 30.0–36.0)
MCV: 85.9 fL (ref 78.0–100.0)
PLATELETS: 130 10*3/uL — AB (ref 150–400)
RBC: 3.9 MIL/uL — ABNORMAL LOW (ref 4.22–5.81)
RDW: 15.1 % (ref 11.5–15.5)
WBC: 16.8 10*3/uL — AB (ref 4.0–10.5)

## 2016-07-20 LAB — MAGNESIUM: MAGNESIUM: 1.4 mg/dL — AB (ref 1.7–2.4)

## 2016-07-20 MED ORDER — POTASSIUM CHLORIDE CRYS ER 20 MEQ PO TBCR
40.0000 meq | EXTENDED_RELEASE_TABLET | Freq: Once | ORAL | Status: AC
  Administered 2016-07-20: 40 meq via ORAL
  Filled 2016-07-20: qty 2

## 2016-07-20 NOTE — Care Management Important Message (Signed)
Important Message  Patient Details  Name: Ethan Rose MRN: [id number] Date of Birth: 08/10/1946   Medicare Important Message Given:  Yes    Camillo Flaming 07/20/2016, 11:40 AMImportant Message  Patient Details  Name: Ethan Rose MRN: [id number] Date of Birth: Mar 01, 1946   Medicare Important Message Given:  Yes    Camillo Flaming 07/20/2016, 11:40 AM

## 2016-07-20 NOTE — Progress Notes (Signed)
PROGRESS NOTE    Ethan Rose  IZT:[id number] DOB: 07-Apr-1946 DOA: 07/16/2016 PCP: Blanchie Serve, MD   Chief Complaint  Patient presents with  . Flank Pain    Brief Narrative:  HPI on 07/16/2016 by Dr. Lily Kocher Ethan Rose is a 70 y.o. gentleman with a history of nephrolithiasis s/p lithotripsy x 4 in the past, prior cervical and lumbar spine surgeries, and GERD who presented to the ED in [facility] for evaluation of two days of progressive left flank pain associated with fever to 102 (documented at home) and generalized weakness. Symptoms started Thursday morning.  Pain and fever were refractory to advil and hydrocodone.  He attempted to get an appointment with his primary urologist on Friday, but he was unsuccessful. He denies dysuria or hematuria.  No nausea or vomiting.  No syncope.  Interim history CT scan stone protocol showed an 50m mid left ureteral calculus with moderate left sided hydroureteronephrosis. Urology was consulted. Code Sepsis was activated. U/A showed infection (nitrite positive, large leukocytes, TNTC WBC, many bacteria) with mild AKI (BUN 41, Cr 1.29), leukocytosis of 19.7 and lactic acid level of 4.81. Blood and urine cultures were sent. The patient received IV Rocephin. He received 2.25L of NS based on sepsis protocol. [facility] for left ureteral stent placement.  [facility]-operatively, [facility]. Transferred to telemetry 07/18/2016.  Currently needing max assist and lift.  PT recommended CIR vs SNF. CIR would like to see more PT notes before assessing.   Assessment & Plan   Severe sepsis with septic shock due to E.Coli bacteremia and pyelonephritis -Sepsis criteria met on admission, source likely E.Coli bacteremia and E.Coli UTI -Patient did require short course on pressor support (neosynephrine) after stent  placement  -Initially on vanco and zosyn, transitioned to Rocephin based on blood culture results which are growing E.Coli sens to Rocephin  -Urine culture also growing E.Coli -Repeat blood cultures 9/4 shows no growth to date -Leukocytosis is improving from admission, however mildly elevated from yesterday. Currently 16.8  Pyelonephritis / Left ureteral stone -S/P stent placement  -Urology consulted and appreciated  Thrombocytopenia -Due to sepsis -No reports of bleeding -Platelets improving, currently 130  Normocytic anemia -Hemoglobin stable at 11.6  Acute kidney injury -Resolved, secondary to sepsis and pyelonephritis  -Creatinine currently 0.59  Hypokalemia -Due to sepsis -Continue to supplement and monitor BMP -Will check magnesium level  Stage 1 pressure ulcer -Over sacrum, per RN care   DVT Prophylaxis  SCDs  Code Status: Full  Family Communication: None at bedside  Disposition Plan: Admitted.  Pending improvement in physical condition and OT consult.   Consultants Urology  PCCM  Procedures  Stent placement 07/16/16  Antibiotics   Anti-infectives    Start     Dose/Rate Route Frequency Ordered Stop   07/17/16 1300  cefTRIAXone (ROCEPHIN) 1 g in dextrose 5 % 50 mL IVPB  Status:  Discontinued     1 g 100 mL/hr over 30 Minutes Intravenous Every 24 hours 07/16/16 1233 07/16/16 1942   07/17/16 1230  cefTRIAXone (ROCEPHIN) 2 g in dextrose 5 % 50 mL IVPB     2 g 100 mL/hr over 30 Minutes Intravenous Every 24 hours 07/17/16 1211     07/17/16 1200  cefTRIAXone (ROCEPHIN) 2 g in dextrose 5 % 50 mL IVPB  Status:  Discontinued  2 g 100 mL/hr over 30 Minutes Intravenous Every 24 hours 07/16/16 1942 07/16/16 1959   07/17/16 1000  vancomycin (VANCOCIN) IVPB 750 mg/150 ml premix  Status:  Discontinued     750 mg 150 mL/hr over 60 Minutes Intravenous Every 12 hours 07/16/16 2033 07/17/16 1211   07/17/16 0400  piperacillin-tazobactam (ZOSYN) IVPB 3.375 g   Status:  Discontinued     3.375 g 12.5 mL/hr over 240 Minutes Intravenous Every 8 hours 07/16/16 2033 07/17/16 1211   07/16/16 2015  piperacillin-tazobactam (ZOSYN) IVPB 3.375 g     3.375 g 100 mL/hr over 30 Minutes Intravenous STAT 07/16/16 2004 07/16/16 2159   07/16/16 2015  vancomycin (VANCOCIN) 1,500 mg in sodium chloride 0.9 % 500 mL IVPB     1,500 mg 250 mL/hr over 120 Minutes Intravenous STAT 07/16/16 2004 07/17/16 0018   07/16/16 1215  cefTRIAXone (ROCEPHIN) 2 g in dextrose 5 % 50 mL IVPB     2 g 100 mL/hr over 30 Minutes Intravenous  Once 07/16/16 1209 07/16/16 1324      Subjective:   Ethan Rose seen and examined today.  Patient continues to complain of weakness, but feels that his legs are getting stronger by the day. Denies any current chest pain, shortness of breath, abdominal pain, nausea or vomiting, dizziness or headache.  Objective:   Vitals:   07/19/16 0544 07/19/16 1433 07/19/16 2000 07/20/16 0526  BP: (!) 142/76 (!) 164/90 (!) 154/90 130/79  Pulse: 78 78 65 74  Resp: _0 Temp: 98.3 F (36.8 C) 98 F (36.7 C) 98 F (36.7 C) 99 F (37.2 C)  TempSrc: Oral Oral Oral Oral  SpO2: 93% 98% 98%   Weight: 81.2 kg (179 lb 0.2 oz)   81 kg (178 lb 9.2 oz)  Height:        Intake/Output Summary (Last 24 hours) at 07/20/16 1142 Last data filed at 07/20/16 0900  Gross per 24 hour  Intake              240 ml  Output             2851 ml  Net            -2611 ml   Filed Weights   07/18/16 1013 07/19/16 0544 07/20/16 0526  Weight: 81.6 kg (179 lb 14.3 oz) 81.2 kg (179 lb 0.2 oz) 81 kg (178 lb 9.2 oz)    Exam  General: Well developed, well nourished, NAD, appears stated age  HEENT: NCAT, mucous membranes moist.   Cardiovascular: S1 S2 auscultated, RRR,   Respiratory: Clear to auscultation bilaterally with equal chest rise  Abdomen: Soft, nontender, nondistended, + bowel sounds  Extremities: warm dry without cyanosis clubbing or edema  Neuro:  AAOx3, nonfocal   Psych: Normal affect and demeanor with intact judgement and insight   Data Reviewed: I have personally reviewed following labs and imaging studies  CBC:  Recent Labs Lab 07/16/16 1200 07/16/16 2253 07/17/16 0521 07/18/16 0853 07/19/16 1106 07/20/16 0528  WBC 19.7* 31.3* 19.9* 14.8* 12.9* 16.8*  NEUTROABS 18.9* 29.8*  --   --   --   --   HGB 13.3 11.4* 11.0* 11.4* 12.0* 11.6*  HCT 38.2* 33.3* 32.3* 33.7* 35.3* 33.5*  MCV 88.4 88.8 87.5 88.2 88.0 85.9  PLT 135* 90* 75* 91* 116* 858*   Basic Metabolic Panel:  Recent Labs Lab 07/16/16 1200 07/16/16 2056 07/17/16 0521 07/18/16 0853 07/20/16 0528  NA 130* 138 137  139 138  K 3.7 3.3* 3.5 3.3* 3.2*  CL 96* 106 110 112* 102  CO2 21* 23 21* 24 29  GLUCOSE 258* 90 86 125* 124*  BUN 41* 34* 29* 22* 15  CREATININE 1.29* 1.12 0.92 0.73 0.59*  CALCIUM 8.7* 7.7* 7.7* 8.1* 7.9*   GFR: Estimated Creatinine Clearance: 88.7 mL/min (by C-G formula based on SCr of 0.8 mg/dL). Liver Function Tests:  Recent Labs Lab 07/16/16 1200 07/16/16 2056  AST 41 28  ALT 27 23  ALKPHOS 110 118  BILITOT 0.8 1.0  PROT 6.5 5.5*  ALBUMIN 3.4* 2.6*   No results for input(s): LIPASE, AMYLASE in the last 168 hours. No results for input(s): AMMONIA in the last 168 hours. Coagulation Profile:  Recent Labs Lab 07/16/16 2056  INR 1.27   Cardiac Enzymes:  Recent Labs Lab 07/16/16 2056 07/17/16 0521  TROPONINI 0.25* 0.32*   BNP (last 3 results) No results for input(s): PROBNP in the last 8760 hours. HbA1C: No results for input(s): HGBA1C in the last 72 hours. CBG:  Recent Labs Lab 07/16/16 1743  GLUCAP 85   Lipid Profile: No results for input(s): CHOL, HDL, LDLCALC, TRIG, CHOLHDL, LDLDIRECT in the last 72 hours. Thyroid Function Tests: No results for input(s): TSH, T4TOTAL, FREET4, T3FREE, THYROIDAB in the last 72 hours. Anemia Panel: No results for input(s): VITAMINB12, FOLATE, FERRITIN, TIBC, IRON,  RETICCTPCT in the last 72 hours. Urine analysis:    Component Value Date/Time   COLORURINE YELLOW 07/16/2016 1130   APPEARANCEUR TURBID (A) 07/16/2016 1130   LABSPEC 1.021 07/16/2016 1130   PHURINE 5.5 07/16/2016 1130   GLUCOSEU 100 (A) 07/16/2016 1130   HGBUR LARGE (A) 07/16/2016 1130   BILIRUBINUR NEGATIVE 07/16/2016 1130   BILIRUBINUR neg 03/30/2012 1543   KETONESUR 15 (A) 07/16/2016 1130   PROTEINUR 30 (A) 07/16/2016 1130   UROBILINOGEN 0.2 03/30/2012 1543   NITRITE POSITIVE (A) 07/16/2016 1130   LEUKOCYTESUR LARGE (A) 07/16/2016 1130   Sepsis Labs: _0 (procalcitonin:4,lacticidven:4)  ) Recent Results (from the past 240 hour(s))  Urine culture     Status: Abnormal   Collection Time: 07/16/16 11:36 AM  Result Value Ref Range Status   Specimen Description URINE, CLEAN CATCH  Final   Special Requests NONE  Final   Culture >=100,000 COLONIES/mL ESCHERICHIA COLI (A)  Final   Report Status 07/19/2016 FINAL  Final   Organism ID, Bacteria ESCHERICHIA COLI (A)  Final      Susceptibility   Escherichia coli - MIC*    AMPICILLIN <=2 SENSITIVE Sensitive     CEFAZOLIN <=4 SENSITIVE Sensitive     CEFTRIAXONE <=1 SENSITIVE Sensitive     CIPROFLOXACIN <=0.25 SENSITIVE Sensitive     GENTAMICIN <=1 SENSITIVE Sensitive     IMIPENEM <=0.25 SENSITIVE Sensitive     NITROFURANTOIN <=16 SENSITIVE Sensitive     TRIMETH/SULFA <=20 SENSITIVE Sensitive     AMPICILLIN/SULBACTAM <=2 SENSITIVE Sensitive     PIP/TAZO <=4 SENSITIVE Sensitive     Extended ESBL NEGATIVE Sensitive     * >=100,000 COLONIES/mL ESCHERICHIA COLI  Blood Culture (routine x 2)     Status: Abnormal   Collection Time: 07/16/16 12:25 PM  Result Value Ref Range Status   Specimen Description BLOOD RIGHT ARM  Final   Special Requests BOTTLES DRAWN AEROBIC AND ANAEROBIC 5CC EACH  Final   Culture  Setup Time   Final    GRAM NEGATIVE RODS IN BOTH AEROBIC AND ANAEROBIC BOTTLES CRITICAL RESULT CALLED TO, READ BACK BY  AND  VERIFIED WITH: T PICKERING,PHARMD AT 1121 07/17/16 BY L BENFIELD    Culture (A)  Final    ESCHERICHIA COLI SUSCEPTIBILITIES PERFORMED ON PREVIOUS CULTURE WITHIN THE LAST 5 DAYS. Performed at [facility]    Report Status 07/19/2016 FINAL  Final  Blood Culture (routine x 2)     Status: Abnormal   Collection Time: 07/16/16 12:40 PM  Result Value Ref Range Status   Specimen Description BLOOD LEFT ARM  Final   Special Requests BOTTLES DRAWN AEROBIC AND ANAEROBIC 5CC EACH  Final   Culture  Setup Time   Final    GRAM NEGATIVE RODS IN BOTH AEROBIC AND ANAEROBIC BOTTLES CRITICAL RESULT CALLED TO, READ BACK BY AND VERIFIED WITH: T PICKERING,PHARMD AT 1121 07/17/16 BY L BENFIELD Performed at [facility] (A)  Final   Report Status 07/19/2016 FINAL  Final   Organism ID, Bacteria ESCHERICHIA COLI  Final      Susceptibility   Escherichia coli - MIC*    AMPICILLIN <=2 SENSITIVE Sensitive     CEFAZOLIN <=4 SENSITIVE Sensitive     CEFEPIME <=1 SENSITIVE Sensitive     CEFTAZIDIME <=1 SENSITIVE Sensitive     CEFTRIAXONE <=1 SENSITIVE Sensitive     CIPROFLOXACIN <=0.25 SENSITIVE Sensitive     GENTAMICIN <=1 SENSITIVE Sensitive     IMIPENEM <=0.25 SENSITIVE Sensitive     TRIMETH/SULFA <=20 SENSITIVE Sensitive     AMPICILLIN/SULBACTAM <=2 SENSITIVE Sensitive     PIP/TAZO <=4 SENSITIVE Sensitive     Extended ESBL NEGATIVE Sensitive     * ESCHERICHIA COLI  Blood Culture ID Panel (Reflexed)     Status: Abnormal   Collection Time: 07/16/16 12:40 PM  Result Value Ref Range Status   Enterococcus species NOT DETECTED NOT DETECTED Final   Listeria monocytogenes NOT DETECTED NOT DETECTED Final   Staphylococcus species NOT DETECTED NOT DETECTED Final   Staphylococcus aureus NOT DETECTED NOT DETECTED Final   Streptococcus species NOT DETECTED NOT DETECTED Final   Streptococcus agalactiae NOT DETECTED NOT DETECTED Final   Streptococcus pneumoniae NOT DETECTED NOT  DETECTED Final   Streptococcus pyogenes NOT DETECTED NOT DETECTED Final   Acinetobacter baumannii NOT DETECTED NOT DETECTED Final   Enterobacteriaceae species DETECTED (A) NOT DETECTED Final    Comment: CRITICAL RESULT CALLED TO, READ BACK BY AND VERIFIED WITH: T PICKERING,PHARMD AT 1121 07/17/16 BY L BENFIELD    Enterobacter cloacae complex NOT DETECTED NOT DETECTED Final   Escherichia coli DETECTED (A) NOT DETECTED Final    Comment: CRITICAL RESULT CALLED TO, READ BACK BY AND VERIFIED WITH: T PICKERING,PHARMD AT 1121 07/17/16 BY L BENFIELD    Klebsiella oxytoca NOT DETECTED NOT DETECTED Final   Klebsiella pneumoniae NOT DETECTED NOT DETECTED Final   Proteus species NOT DETECTED NOT DETECTED Final   Serratia marcescens NOT DETECTED NOT DETECTED Final   Carbapenem resistance NOT DETECTED NOT DETECTED Final   Haemophilus influenzae NOT DETECTED NOT DETECTED Final   Neisseria meningitidis NOT DETECTED NOT DETECTED Final   Pseudomonas aeruginosa NOT DETECTED NOT DETECTED Final   Candida albicans NOT DETECTED NOT DETECTED Final   Candida glabrata NOT DETECTED NOT DETECTED Final   Candida krusei NOT DETECTED NOT DETECTED Final   Candida parapsilosis NOT DETECTED NOT DETECTED Final   Candida tropicalis NOT DETECTED NOT DETECTED Final    Comment: Performed at [facility]  Culture, blood (routine x 2)     Status: None (Preliminary result)  Collection Time: 07/18/16  6:10 PM  Result Value Ref Range Status   Specimen Description BLOOD RIGHT HAND  Final   Special Requests NONE  Final   Culture   Final    NO GROWTH < 24 HOURS Performed at [facility]    Report Status PENDING  Incomplete  Culture, blood (routine x 2)     Status: None (Preliminary result)   Collection Time: 07/18/16  6:10 PM  Result Value Ref Range Status   Specimen Description BLOOD BLOOD LEFT ARM  Final   Special Requests NONE  Final   Culture   Final    NO GROWTH < 24 HOURS Performed at [facility]    Report Status PENDING  Incomplete      Radiology Studies: No results found.   Scheduled Meds: . cefTRIAXone (ROCEPHIN)  IV  2 g Intravenous Q24H  . sodium chloride flush  3 mL Intravenous Q12H   Continuous Infusions:    LOS: 4 days   Time Spent in minutes   30 minutes  Juanpablo Ciresi D.O. on 07/20/2016 at 11:42 AM  Between 7am to 7pm - Pager - [phone]  After 7pm go to [email or web address] - password [email or web address]  And look for the night coverage person covering for me after hours  [facility] Office  [phone]

## 2016-07-20 NOTE — Progress Notes (Signed)
I met with pt at bedside to discuss a possible inpt rehab admission. Reviewed OT eval. Pt has been to [facility] during two previous back surgeries postop. I will discuss with Rehab MD in the morning and follow up with our recommendations at that time. [phone]

## 2016-07-20 NOTE — Evaluation (Signed)
Occupational Therapy Evaluation Patient Details Name: Ethan Rose MRN: [id number] DOB: [birth year] Today's Date: 07/20/2016    History of Present Illness 70 y.o. male with a past medical history of nephrolithiasis s/p lithotripsy x 4 in the past, hx of T10-iliac fusion in May 2016 and C4-5, C5-6, C6-7 ACDF in May 2017, GERD and admitted with severe sepsis with septic shock due to E.Coli bacteremia    Clinical Impression   Pt admitted with severe sepsis. Pt currently with functional limitations due to the deficits listed below (see OT Problem List).  Pt will benefit from skilled OT to increase their safety and independence with ADL and functional mobility for ADL to facilitate discharge to venue listed below.      Follow Up Recommendations  CIR    Equipment Recommendations  None recommended by OT       Precautions / Restrictions Precautions Precautions: Fall      Mobility Bed Mobility   Bed Mobility: Supine to Sit     Supine to sit: HOB elevated;Mod assist        Transfers Overall transfer level: Needs assistance   Transfers: Sit to/from Stand Sit to Stand: Max assist;+2 physical assistance;+2 safety/equipment         General transfer comment: OT used steady for pt to get from bed to chair. sit to stand max A of 2         ADL Overall ADL's : Needs assistance/impaired Eating/Feeding: Set up;Sitting   Grooming: Set up;Sitting   Upper Body Bathing: Minimal assitance;Sitting   Lower Body Bathing: +2 for safety/equipment;+2 for physical assistance;Maximal assistance;Cueing for safety;Cueing for sequencing   Upper Body Dressing : Set up;Sitting   Lower Body Dressing: Cueing for safety;Cueing for sequencing;+2 for safety/equipment;+2 for physical assistance;Maximal assistance   Toilet Transfer: +2 for physical assistance;Maximal assistance;Cueing for safety;Cueing for sequencing Toilet Transfer Details (indicate cue type and reason): sit to stand  only Toileting- Clothing Manipulation and Hygiene: +2 for physical assistance;+2 for safety/equipment;Sit to/from stand;Maximal assistance         General ADL Comments: used steady to get to chair               Pertinent Vitals/Pain Pain Assessment: No/denies pain        Extremity/Trunk Assessment Upper Extremity Assessment Upper Extremity Assessment: Generalized weakness           Communication Communication Communication: No difficulties   Cognition Arousal/Alertness: Awake/alert Behavior During Therapy: WFL for tasks assessed/performed Overall Cognitive Status: Within Functional Limits for tasks assessed                     General Comments    Pt will benefit from post acute rehab           Home Living Family/patient expects to be discharged to:: Inpatient rehab Living Arrangements: Alone                               Additional Comments: has 3:1 in shower and another over toilet; has high bed, some piece of sheeting on first two steps as ramp, so leaves one step up at top, uses rollator, wears AFOs in community but none in house, plans to d/c to rehab at this time      Prior Functioning/Environment Level of Independence: Independent with assistive device(s)        Comments: states has worked hard in PT from [facility] place after both  spinal surgeries, to HHPT with lots of HEP with stationary bike, leg and arm weights over past year, able to ambulate shorter distances with rollator; no longer drives since cervical spine surgery    OT Diagnosis: Generalized weakness   OT Problem List: Decreased strength;Decreased activity tolerance;Impaired balance (sitting and/or standing)   OT Treatment/Interventions: DME and/or AE instruction;Self-care/ADL training    OT Goals(Current goals can be found in the care plan section) Acute Rehab OT Goals Patient Stated Goal: get stronger and ambulate again OT Goal Formulation: With patient Time For  Goal Achievement: 08/03/16 Potential to Achieve Goals: Good ADL Goals Pt Will Perform Grooming: standing;with modified independence Pt Will Perform Lower Body Dressing: with modified independence;sit to/from stand;with adaptive equipment Pt Will Transfer to Toilet: with modified independence;regular height toilet;ambulating Pt Will Perform Toileting - Clothing Manipulation and hygiene: with modified independence;sit to/from stand  OT Frequency: Min 2X/week   Barriers to D/C: Decreased caregiver support             End of Session Equipment Utilized During Treatment: Other (comment) (STEADY) Nurse Communication: Mobility status  Activity Tolerance: Patient tolerated treatment well Patient left: in chair;with call bell/phone within reach    [facility], Edwena Felty D 07/20/2016, 2:03 PM

## 2016-07-21 LAB — BASIC METABOLIC PANEL
Anion gap: 6 (ref 5–15)
BUN: 15 mg/dL (ref 6–20)
CHLORIDE: 101 mmol/L (ref 101–111)
CO2: 31 mmol/L (ref 22–32)
CREATININE: 0.59 mg/dL — AB (ref 0.61–1.24)
Calcium: 8.1 mg/dL — ABNORMAL LOW (ref 8.9–10.3)
GFR calc non Af Amer: >60 mL/min (ref >60)
Glucose, Bld: 122 mg/dL — ABNORMAL HIGH (ref 65–99)
POTASSIUM: 3.7 mmol/L (ref 3.5–5.1)
SODIUM: 138 mmol/L (ref 135–145)

## 2016-07-21 LAB — CBC
HEMATOCRIT: 33.2 % — AB (ref 39.0–52.0)
HEMOGLOBIN: 11.7 g/dL — AB (ref 13.0–17.0)
MCH: 30.2 pg (ref 26.0–34.0)
MCHC: 35.2 g/dL (ref 30.0–36.0)
MCV: 85.8 fL (ref 78.0–100.0)
Platelets: 180 10*3/uL (ref 150–400)
RBC: 3.87 MIL/uL — AB (ref 4.22–5.81)
RDW: 15 % (ref 11.5–15.5)
WBC: 19.3 10*3/uL — ABNORMAL HIGH (ref 4.0–10.5)

## 2016-07-21 MED ORDER — MAGNESIUM SULFATE 2 GM/50ML IV SOLN
2.0000 g | Freq: Once | INTRAVENOUS | Status: AC
  Administered 2016-07-21: 2 g via INTRAVENOUS
  Filled 2016-07-21: qty 50

## 2016-07-21 NOTE — Progress Notes (Signed)
Physical Therapy Treatment Patient Details Name: Ethan Rose MRN: [id number] DOB: 10-13-1946 Today's Date: 07/21/2016    History of Present Illness 70 y.o. male with a past medical history of nephrolithiasis s/p lithotripsy x 4 in the past, hx of T10-iliac fusion in May 2016 and C4-5, C5-6, C6-7 ACDF in May 2017, GERD and admitted with severe sepsis with septic shock due to E.Coli bacteremia     PT Comments    Pt was able to perform sit to stand using Stedy and performed ther ex in bed. Pt tolerated treatment well and is hopeful to be discharged to CIR.  Follow Up Recommendations  CIR     Equipment Recommendations  Wheelchair (measurements PT);Wheelchair cushion (measurements PT)    Recommendations for Other Services       Precautions / Restrictions Precautions Precautions: Fall    Mobility  Bed Mobility Overal bed mobility: Needs Assistance Bed Mobility: Sit to Supine     Supine to sit: Mod assist;HOB elevated Sit to supine: Min assist   General bed mobility comments: pt required assist to bring R LE onto the bed; pt attempted to self assist but struggled bringing legs up onto the bed and lowering to supine in a controlled fashion; pt was uncoordinated in all movements  Transfers Overall transfer level: Needs assistance   Transfers: Sit to/from Stand Sit to Stand: +2 physical assistance;Max assist         General transfer comment: pt initially used Stedy in backwards position for support and to pull up on to stand; knees were blocked, but pt did not buckle; pt then used Stedy in standard position for transfer to bed due to fatigue; pt was able to stand for approx. 1 min, however could not march in place, so [name] was used for transfer because of this  Ambulation/Gait                 Financial trader [employer] (Stroke Patients Only)       Balance                                    Cognition  Arousal/Alertness: Awake/alert Behavior During Therapy: WFL for tasks assessed/performed Overall Cognitive Status: Within Functional Limits for tasks assessed                      Exercises General Exercises - Lower Extremity Quad Sets: AROM;Both;10 reps Short Arc Quad: AROM;Both;15 reps Heel Slides: AAROM;Both;10 reps Hip ABduction/ADduction: AROM;Both;10 reps    General Comments        Pertinent Vitals/Pain Pain Assessment: No/denies pain    Home Living     Available Help at Discharge: Family;Available PRN/intermittently (brother and family can give close to 24/7 supervision for a ) Type of Home: House Home Access: Stairs to enter Entrance Stairs-Rails: Left Home Layout: Two level;Able to live on main level with bedroom/bathroom   Additional Comments: has 3:1 in shower and another over toilet; has high bed, some piece of sheeting on first two steps as ramp, so leaves one step up at top, uses rollator, wears AFOs in community but none in house, plans to d/c to rehab at this time    Prior Function            PT Goals (current goals can now be found in  the care plan section) Progress towards PT goals: Progressing toward goals    Frequency  Min 4X/week    PT Plan Current plan remains appropriate    Co-evaluation             End of Session Equipment Utilized During Treatment: Gait belt Activity Tolerance: Patient tolerated treatment well Patient left: in bed;with call bell/phone within reach;with bed alarm set (pt requested all 4 bedrails up)     Time: [id number] PT Time Calculation (min) (ACUTE ONLY): 25 min  Charges:  $Therapeutic Exercise: 8-22 mins $Therapeutic Activity: 8-22 mins                    G Codes:      [name], SPT  [name] 07/21/2016, 3:52 PM

## 2016-07-21 NOTE — Clinical Social Work Placement (Signed)
Patient has a bed at [facility] is unable to take patient tomorrow. CSW has completed FL2 & will continue to follow and assist with discharge when ready.    Raynaldo Opitz, [facility] Clinical Social Worker cell #: [phone]     CLINICAL SOCIAL WORK PLACEMENT  NOTE  Date:  07/21/2016  Patient Details  Name: Ethan Rose MRN: [id number] Date of Birth: Mar 02, 1946  Clinical Social Work is seeking post-discharge placement for this patient at the [place] level of care (*CSW will initial, date and re-position this form in  chart as items are completed):  Yes   Patient/family provided with [place] Work Department's list of facilities offering this level of care within the geographic area requested by the patient (or if unable, by the patient's family).  Yes   Patient/family informed of their freedom to choose among providers that offer the needed level of care, that participate in Medicare, Medicaid or managed care program needed by the patient, have an available bed and are willing to accept the patient.  Yes   Patient/family informed of [facility]'s ownership interest in [facility] and [facility], as well as of the fact that they are under no obligation to receive care at these facilities.  PASRR submitted to EDS on       PASRR number received on       Existing PASRR number confirmed on 07/19/16     FL2 transmitted to all facilities in geographic area requested by pt/family on 07/19/16     FL2 transmitted to all facilities within larger geographic area on       Patient informed that his/her managed care company has contracts with or will negotiate with certain facilities, including the following:        Yes   Patient/family informed of bed offers received.  Patient chooses bed at [facility]     Physician recommends and patient chooses bed at      Patient to be transferred to [facility] on  .  Patient to be transferred to facility by       Patient family notified on   of transfer.  Name of family member notified:        PHYSICIAN       Additional Comment:    _______________________________________________ Standley Brooking, LCSW 07/21/2016, 3:08 PM

## 2016-07-21 NOTE — Progress Notes (Signed)
Noted increased WBC today from yesterday. I have discussed with RN CM and pt by phone. I can not offer an inpt rehab bed for today. I will follow up in the morning. RN CM has discussed with SW. [phone]

## 2016-07-21 NOTE — Progress Notes (Signed)
Occupational Therapy Treatment Patient Details Name: PHELPS EICHE MRN: [id number] DOB: August 21, 1946 Today's Date: 07/21/2016    History of present illness 70 y.o. male with a past medical history of nephrolithiasis s/p lithotripsy x 4 in the past, hx of T10-iliac fusion in May 2016 and C4-5, C5-6, C6-7 ACDF in May 2017, GERD and admitted with severe sepsis with septic shock due to E.Coli bacteremia    OT comments  Pt very motivated  Follow Up Recommendations  CIR    Equipment Recommendations  None recommended by OT    Recommendations for Other Services      Precautions / Restrictions Precautions Precautions: Fall Restrictions Weight Bearing Restrictions: Yes       Mobility Bed Mobility Overal bed mobility: Needs Assistance Bed Mobility: Supine to Sit     Supine to sit: Mod assist;HOB elevated     General bed mobility comments: assist for LEs over EOB, pt able to self assist trunk upright by elevating HOB and using bed rail  Transfers Overall transfer level: Needs assistance               General transfer comment: OT used steady for pt to get from bed to chair. sit to stand max A         ADL Overall ADL's : Needs assistance/impaired Eating/Feeding: Set up;Sitting   Grooming: Set up;Sitting   Upper Body Bathing: Set up;Minimal assitance;Sitting   Lower Body Bathing: Maximal assistance;Cueing for safety;Cueing for sequencing;Cueing for compensatory techniques Lower Body Bathing Details (indicate cue type and reason): pt able to stand on steady while OT washed back and bottom. Pt able to stand on steady for appox 1 min prior to needing a break.  Pt performed 5 sit to stands on steady  with mod A to stand. Pt very dependent on arms for transition. Upper Body Dressing : Set up;Sitting   Lower Body Dressing: Cueing for safety;Cueing for sequencing;Maximal assistance       Toileting- Clothing Manipulation and Hygiene: Maximal assistance Toileting - Clothing  Manipulation Details (indicate cue type and reason): OT used steady to get pt from bed to chair       General ADL Comments: used steady to get to chair                Cognition   Behavior During Therapy: [facility] for tasks assessed/performed Overall Cognitive Status: Within Functional Limits for tasks assessed                                           Progress Toward Goals  OT Goals(current goals can now be found in the care plan section)  Progress towards OT goals: Progressing toward goals     Plan Discharge plan remains appropriate       End of Session Equipment Utilized During Treatment: Other (comment) (STEADY)   Activity Tolerance Patient tolerated treatment well   Patient Left in chair;with call bell/phone within reach   Nurse Communication Mobility status        Time: [id number] OT Time Calculation (min): 48 min  Charges: OT General Charges $OT Visit: 1 Procedure OT Treatments $Self Care/Home Management : 38-52 mins  Munachimso Palin, Thereasa Parkin 07/21/2016, 12:45 PM

## 2016-07-21 NOTE — Progress Notes (Signed)
PROGRESS NOTE    Ethan Rose  [id number] DOB: [birth year] DOA: 07/16/2016 PCP: Blanchie Serve, MD   Chief Complaint  Patient presents with  . Flank Pain    Brief Narrative:  HPI on 07/16/2016 by Dr. Lily Kocher Ethan Rose is a 70 y.o. gentleman with a history of nephrolithiasis s/p lithotripsy x 4 in the past, prior cervical and lumbar spine surgeries, and GERD who presented to the ED in [facility] for evaluation of two days of progressive left flank pain associated with fever to 102 (documented at home) and generalized weakness. Symptoms started Thursday morning.  Pain and fever were refractory to advil and hydrocodone.  He attempted to get an appointment with his primary urologist on Friday, but he was unsuccessful. He denies dysuria or hematuria.  No nausea or vomiting.  No syncope.  Interim history CT scan stone protocol showed an 58m mid left ureteral calculus with moderate left sided hydroureteronephrosis. Urology was consulted. Code Sepsis was activated. U/A showed infection (nitrite positive, large leukocytes, TNTC WBC, many bacteria) with mild AKI (BUN 41, Cr 1.29), leukocytosis of 19.7 and lactic acid level of 4.81. Blood and urine cultures were sent. The patient received IV Rocephin. He received 2.25L of NS based on sepsis protocol. [facility] for left ureteral stent placement.  [facility]. Transferred to telemetry 07/18/2016.  Currently needing max assist and lift.  PT recommended CIR vs SNF. CIR pending.  Assessment & Plan   Severe sepsis with septic shock due to E.Coli bacteremia and pyelonephritis -Sepsis criteria met on admission, source likely E.Coli bacteremia and E.Coli UTI -Patient did require short course on pressor support (neosynephrine) after stent placement  -Initially on vanco and zosyn,  transitioned to Rocephin based on blood culture results which are growing E.Coli sens to Rocephin  -Urine culture also growing E.Coli -Repeat blood cultures 9/4 shows no growth to date -Leukocytosis is improving from admission, however mildly elevated from yesterday. Currently 140-Spoke with Dr. JMichel Bickers Infectious Disease, on 9/7. He recommended to cease obtaining CBC and following a lab values.  Agreed with assessing the patient and clinical course, not following WBC.  The patient is on appropriate treatment. Currently afebrile. No diarrhea or shortness of breath/cough.   Pyelonephritis / Left ureteral stone -S/P stent placement  -Urology consulted and appreciated  Thrombocytopenia -Due to sepsis -No reports of bleeding -Platelets improving, currently 180  Normocytic anemia -Hemoglobin stable at 11.6  Acute kidney injury -Resolved, secondary to sepsis and pyelonephritis  -Creatinine currently 0.59  Hypokalemia -Resolved, Due to sepsis -Continue to supplement and monitor BMP -magnesium level 1.4- will replace  Stage 1 pressure ulcer -Over sacrum, per RN care   DVT Prophylaxis  SCDs  Code Status: Full  Family Communication: None at bedside  Disposition Plan: Admitted.  Pending CIR approval.  Will likely discharge to SNF vs CIR on 07/22/2016.   Consultants Urology  PCCM Infectious disease, Dr. CMegan Salon via phone  Procedures  Stent placement 07/16/16  Antibiotics   Anti-infectives    Start     Dose/Rate Route Frequency Ordered Stop   07/17/16 1300  cefTRIAXone (ROCEPHIN) 1 g in dextrose 5 % 50 mL IVPB  Status:  Discontinued     1 g 100 mL/hr over 30 Minutes Intravenous Every 24 hours 07/16/16 1233 07/16/16 1942   07/17/16 1230  cefTRIAXone (ROCEPHIN)  2 g in dextrose 5 % 50 mL IVPB     2 g 100 mL/hr over 30 Minutes Intravenous Every 24 hours 07/17/16 1211     07/17/16 1200  cefTRIAXone (ROCEPHIN) 2 g in dextrose 5 % 50 mL IVPB  Status:  Discontinued      2 g 100 mL/hr over 30 Minutes Intravenous Every 24 hours 07/16/16 1942 07/16/16 1959   07/17/16 1000  vancomycin (VANCOCIN) IVPB 750 mg/150 ml premix  Status:  Discontinued     750 mg 150 mL/hr over 60 Minutes Intravenous Every 12 hours 07/16/16 2033 07/17/16 1211   07/17/16 0400  piperacillin-tazobactam (ZOSYN) IVPB 3.375 g  Status:  Discontinued     3.375 g 12.5 mL/hr over 240 Minutes Intravenous Every 8 hours 07/16/16 2033 07/17/16 1211   07/16/16 2015  piperacillin-tazobactam (ZOSYN) IVPB 3.375 g     3.375 g 100 mL/hr over 30 Minutes Intravenous STAT 07/16/16 2004 07/16/16 2159   07/16/16 2015  vancomycin (VANCOCIN) 1,500 mg in sodium chloride 0.9 % 500 mL IVPB     1,500 mg 250 mL/hr over 120 Minutes Intravenous STAT 07/16/16 2004 07/17/16 0018   07/16/16 1215  cefTRIAXone (ROCEPHIN) 2 g in dextrose 5 % 50 mL IVPB     2 g 100 mL/hr over 30 Minutes Intravenous  Once 07/16/16 1209 07/16/16 1324      Subjective:   Ethan Rose seen and examined today.  Patient continues to feel weak, but improved. Denies any current chest pain, shortness of breath, abdominal pain, nausea or vomiting, diarrhea, dizziness or headache.  Objective:   Vitals:   07/20/16 1527 07/20/16 2029 07/21/16 0527 07/21/16 0900  BP: 137/79 139/81 131/65 117/65  Pulse: 79 79 76 74  Resp: '19 20 20 20  ' Temp: 98.9 F (37.2 C) 99 F (37.2 C) 98.9 F (37.2 C)   TempSrc: Oral Oral Oral   SpO2: 100% 96% 94% 95%  Weight:   73.9 kg (162 lb 14.7 oz)   Height:        Intake/Output Summary (Last 24 hours) at 07/21/16 1231 Last data filed at 07/21/16 0940  Gross per 24 hour  Intake              240 ml  Output             5800 ml  Net            -5560 ml   Filed Weights   07/19/16 0544 07/20/16 0526 07/21/16 0527  Weight: 81.2 kg (179 lb 0.2 oz) 81 kg (178 lb 9.2 oz) 73.9 kg (162 lb 14.7 oz)    Exam  General: Well developed, well nourished, NAD, appears stated age  HEENT: NCAT, mucous membranes moist.    Cardiovascular: S1 S2 auscultated, RRR, no murmurs appreciated  Respiratory: Clear to auscultation bilaterally with equal chest rise  Abdomen: Soft, nontender, nondistended, + bowel sounds  Extremities: warm dry without cyanosis clubbing or edema  Neuro: AAOx3, nonfocal   Psych: Normal affect and demeanor with intact judgement and insight, pleasant   Data Reviewed: I have personally reviewed following labs and imaging studies  CBC:  Recent Labs Lab 07/16/16 1200 07/16/16 2253 07/17/16 0521 07/18/16 0853 07/19/16 1106 07/20/16 0528 07/21/16 0523  WBC 19.7* 31.3* 19.9* 14.8* 12.9* 16.8* 19.3*  NEUTROABS 18.9* 29.8*  --   --   --   --   --   HGB 13.3 11.4* 11.0* 11.4* 12.0* 11.6* 11.7*  HCT 38.2* 33.3* 32.3* 33.7*  35.3* 33.5* 33.2*  MCV 88.4 88.8 87.5 88.2 88.0 85.9 85.8  PLT 135* 90* 75* 91* 116* 130* 268   Basic Metabolic Panel:  Recent Labs Lab 07/16/16 2056 07/17/16 0521 07/18/16 0853 07/20/16 0528 07/21/16 0523  NA 138 137 139 138 138  K 3.3* 3.5 3.3* 3.2* 3.7  CL 106 110 112* 102 101  CO2 23 21* '24 29 31  ' GLUCOSE 90 86 125* 124* 122*  BUN 34* 29* 22* 15 15  CREATININE 1.12 0.92 0.73 0.59* 0.59*  CALCIUM 7.7* 7.7* 8.1* 7.9* 8.1*  MG  --   --   --  1.4*  --    GFR: Estimated Creatinine Clearance: 88.7 mL/min (by C-G formula based on SCr of 0.8 mg/dL). Liver Function Tests:  Recent Labs Lab 07/16/16 1200 07/16/16 2056  AST 41 28  ALT 27 23  ALKPHOS 110 118  BILITOT 0.8 1.0  PROT 6.5 5.5*  ALBUMIN 3.4* 2.6*   No results for input(s): LIPASE, AMYLASE in the last 168 hours. No results for input(s): AMMONIA in the last 168 hours. Coagulation Profile:  Recent Labs Lab 07/16/16 2056  INR 1.27   Cardiac Enzymes:  Recent Labs Lab 07/16/16 2056 07/17/16 0521  TROPONINI 0.25* 0.32*   BNP (last 3 results) No results for input(s): PROBNP in the last 8760 hours. HbA1C: No results for input(s): HGBA1C in the last 72 hours. CBG:  Recent  Labs Lab 07/16/16 1743  GLUCAP 85   Lipid Profile: No results for input(s): CHOL, HDL, LDLCALC, TRIG, CHOLHDL, LDLDIRECT in the last 72 hours. Thyroid Function Tests: No results for input(s): TSH, T4TOTAL, FREET4, T3FREE, THYROIDAB in the last 72 hours. Anemia Panel: No results for input(s): VITAMINB12, FOLATE, FERRITIN, TIBC, IRON, RETICCTPCT in the last 72 hours. Urine analysis:    Component Value Date/Time   COLORURINE YELLOW 07/16/2016 1130   APPEARANCEUR TURBID (A) 07/16/2016 1130   LABSPEC 1.021 07/16/2016 1130   PHURINE 5.5 07/16/2016 1130   GLUCOSEU 100 (A) 07/16/2016 1130   HGBUR LARGE (A) 07/16/2016 1130   BILIRUBINUR NEGATIVE 07/16/2016 1130   BILIRUBINUR neg 03/30/2012 1543   KETONESUR 15 (A) 07/16/2016 1130   PROTEINUR 30 (A) 07/16/2016 1130   UROBILINOGEN 0.2 03/30/2012 1543   NITRITE POSITIVE (A) 07/16/2016 1130   LEUKOCYTESUR LARGE (A) 07/16/2016 1130   Sepsis Labs: '@LABRCNTIP' (procalcitonin:4,lacticidven:4)  ) Recent Results (from the past 240 hour(s))  Urine culture     Status: Abnormal   Collection Time: 07/16/16 11:36 AM  Result Value Ref Range Status   Specimen Description URINE, CLEAN CATCH  Final   Special Requests NONE  Final   Culture >=100,000 COLONIES/mL ESCHERICHIA COLI (A)  Final   Report Status 07/19/2016 FINAL  Final   Organism ID, Bacteria ESCHERICHIA COLI (A)  Final      Susceptibility   Escherichia coli - MIC*    AMPICILLIN <=2 SENSITIVE Sensitive     CEFAZOLIN <=4 SENSITIVE Sensitive     CEFTRIAXONE <=1 SENSITIVE Sensitive     CIPROFLOXACIN <=0.25 SENSITIVE Sensitive     GENTAMICIN <=1 SENSITIVE Sensitive     IMIPENEM <=0.25 SENSITIVE Sensitive     NITROFURANTOIN <=16 SENSITIVE Sensitive     TRIMETH/SULFA <=20 SENSITIVE Sensitive     AMPICILLIN/SULBACTAM <=2 SENSITIVE Sensitive     PIP/TAZO <=4 SENSITIVE Sensitive     Extended ESBL NEGATIVE Sensitive     * >=100,000 COLONIES/mL ESCHERICHIA COLI  Blood Culture (routine x 2)      Status: Abnormal  Collection Time: 07/16/16 12:25 PM  Result Value Ref Range Status   Specimen Description BLOOD RIGHT ARM  Final   Special Requests BOTTLES DRAWN AEROBIC AND ANAEROBIC 5CC EACH  Final   Culture  Setup Time   Final    GRAM NEGATIVE RODS IN BOTH AEROBIC AND ANAEROBIC BOTTLES CRITICAL RESULT CALLED TO, READ BACK BY AND VERIFIED WITH: T PICKERING,PHARMD AT 1121 07/17/16 BY L BENFIELD    Culture (A)  Final    ESCHERICHIA COLI SUSCEPTIBILITIES PERFORMED ON PREVIOUS CULTURE WITHIN THE LAST 5 DAYS. Performed at [facility]    Report Status 07/19/2016 FINAL  Final  Blood Culture (routine x 2)     Status: Abnormal   Collection Time: 07/16/16 12:40 PM  Result Value Ref Range Status   Specimen Description BLOOD LEFT ARM  Final   Special Requests BOTTLES DRAWN AEROBIC AND ANAEROBIC 5CC EACH  Final   Culture  Setup Time   Final    GRAM NEGATIVE RODS IN BOTH AEROBIC AND ANAEROBIC BOTTLES CRITICAL RESULT CALLED TO, READ BACK BY AND VERIFIED WITH: T PICKERING,PHARMD AT 1121 07/17/16 BY L BENFIELD Performed at [facility] (A)  Final   Report Status 07/19/2016 FINAL  Final   Organism ID, Bacteria ESCHERICHIA COLI  Final      Susceptibility   Escherichia coli - MIC*    AMPICILLIN <=2 SENSITIVE Sensitive     CEFAZOLIN <=4 SENSITIVE Sensitive     CEFEPIME <=1 SENSITIVE Sensitive     CEFTAZIDIME <=1 SENSITIVE Sensitive     CEFTRIAXONE <=1 SENSITIVE Sensitive     CIPROFLOXACIN <=0.25 SENSITIVE Sensitive     GENTAMICIN <=1 SENSITIVE Sensitive     IMIPENEM <=0.25 SENSITIVE Sensitive     TRIMETH/SULFA <=20 SENSITIVE Sensitive     AMPICILLIN/SULBACTAM <=2 SENSITIVE Sensitive     PIP/TAZO <=4 SENSITIVE Sensitive     Extended ESBL NEGATIVE Sensitive     * ESCHERICHIA COLI  Blood Culture ID Panel (Reflexed)     Status: Abnormal   Collection Time: 07/16/16 12:40 PM  Result Value Ref Range Status   Enterococcus species NOT DETECTED NOT DETECTED  Final   Listeria monocytogenes NOT DETECTED NOT DETECTED Final   Staphylococcus species NOT DETECTED NOT DETECTED Final   Staphylococcus aureus NOT DETECTED NOT DETECTED Final   Streptococcus species NOT DETECTED NOT DETECTED Final   Streptococcus agalactiae NOT DETECTED NOT DETECTED Final   Streptococcus pneumoniae NOT DETECTED NOT DETECTED Final   Streptococcus pyogenes NOT DETECTED NOT DETECTED Final   Acinetobacter baumannii NOT DETECTED NOT DETECTED Final   Enterobacteriaceae species DETECTED (A) NOT DETECTED Final    Comment: CRITICAL RESULT CALLED TO, READ BACK BY AND VERIFIED WITH: T PICKERING,PHARMD AT 1121 07/17/16 BY L BENFIELD    Enterobacter cloacae complex NOT DETECTED NOT DETECTED Final   Escherichia coli DETECTED (A) NOT DETECTED Final    Comment: CRITICAL RESULT CALLED TO, READ BACK BY AND VERIFIED WITH: T PICKERING,PHARMD AT 1121 07/17/16 BY L BENFIELD    Klebsiella oxytoca NOT DETECTED NOT DETECTED Final   Klebsiella pneumoniae NOT DETECTED NOT DETECTED Final   Proteus species NOT DETECTED NOT DETECTED Final   Serratia marcescens NOT DETECTED NOT DETECTED Final   Carbapenem resistance NOT DETECTED NOT DETECTED Final   Haemophilus influenzae NOT DETECTED NOT DETECTED Final   Neisseria meningitidis NOT DETECTED NOT DETECTED Final   Pseudomonas aeruginosa NOT DETECTED NOT DETECTED Final   Candida albicans NOT DETECTED NOT DETECTED  Final   Candida glabrata NOT DETECTED NOT DETECTED Final   Candida krusei NOT DETECTED NOT DETECTED Final   Candida parapsilosis NOT DETECTED NOT DETECTED Final   Candida tropicalis NOT DETECTED NOT DETECTED Final    Comment: Performed at [facility]  Culture, blood (routine x 2)     Status: None (Preliminary result)   Collection Time: 07/18/16  6:10 PM  Result Value Ref Range Status   Specimen Description BLOOD RIGHT HAND  Final   Special Requests NONE  Final   Culture   Final    NO GROWTH 2 DAYS Performed at [facility]     Report Status PENDING  Incomplete  Culture, blood (routine x 2)     Status: None (Preliminary result)   Collection Time: 07/18/16  6:10 PM  Result Value Ref Range Status   Specimen Description BLOOD BLOOD LEFT ARM  Final   Special Requests NONE  Final   Culture   Final    NO GROWTH 2 DAYS Performed at [facility]    Report Status PENDING  Incomplete      Radiology Studies: No results found.   Scheduled Meds: . cefTRIAXone (ROCEPHIN)  IV  2 g Intravenous Q24H  . sodium chloride flush  3 mL Intravenous Q12H   Continuous Infusions:    LOS: 5 days   Time Spent in minutes   30 minutes  Nielle Duford D.O. on 07/21/2016 at 12:31 PM  Between 7am to 7pm - Pager - [phone]  After 7pm go to [email or web address] - password [email or web address]  And look for the night coverage person covering for me after hours  [facility] Office  [phone]

## 2016-07-21 NOTE — PMR Pre-admission (Signed)
[facility] PMR Admission Coordinator Pre-Admission Assessment  Patient: Ethan Rose is an 70 y.o., male MRN: [id number] DOB: [birth year] Height: '5\' 10"'  (177.8 cm) Weight: 72.5 kg (159 lb 12.8 oz)  Insurance Information HMO:     PPO:      PCP:      IPA:      80/20: yes     OTHER: no HMO PRIMARY: Medicare a and b      Policy#: [id number]      Subscriber: pt Benefits:  Phone #: passport oneo online     Name: 07/21/16 Eff. Date: 03/15/11     Deduct: $1316      Out of Pocket Max: none      Life Max: none CIR: 100%      SNF: last SNF 5/17 Outpatient: 80%     Co-Pay: 20% Home Health: 100%      Co-Pay: none DME: 80%     Co-Pay: 20% Providers: pt choice  SECONDARY: [place] [place] supplement      Policy#: [id number]      Subscriber: pt  Medicaid Application Date:       Case Manager:  Disability Application Date:       Case Worker:   Emergency Contact Information Contact Information    Name Relation Home Work [place] Daughter   [phone]   [facility] Other   [phone]      Current Medical History  Patient Admitting Diagnosis: Debility  History of Present Illness: Ethan Redner Simpsonis a 70 y.o.gentleman with a history of nephrolithiasis s/p lithotripsy x 4 in the past, prior cervical and lumbar spine surgeries, and GERD who presented to the ED in [facility] for evaluation of two days of progressive left flank pain associated with fever to 102 (documented at home) and generalized weakness. Symptoms started Thursday morning. Pain and fever were refractory to advil and hydrocodone. He attempted to get an appointment with his primary urologist on Friday, but he was unsuccessful. He denies dysuria or hematuria. No nausea or vomiting. No syncope.  CT scan stone protocol showed an 55m mid left ureteral calculus with moderate left sided hydroureteronephrosis. Urology was consulted. Code Sepsis was activated. U/A showed infection (nitrite positive, large  leukocytes, TNTC WBC, many bacteria) with mild AKI (BUN 41, Cr 1.29), leukocytosis of 19.7 and lactic acid level of 4.81. Blood and urine cultures were sent. The patient received IV Rocephin. He received 2.25L of NS based on sepsis protocol. [facility] left ureteral stent placement.  [facility], [facility]. Transferred to telemetry 07/18/2016.   Initially on Vanc and Zosyn, transitioned to Rocephin based on blood cultures results which grew E. Coli sensitive to Rocephin. Urine culture also grew E Coli. Leukocytosis improved from admission but with elevation on 9/7 to 19 K. MD spoke with Dr. JMichel Bickersof ID and he recommended to cease obtaining daily CBCs for pt clinically improving.   [facility] by the [facility] admission coordinator and physician. NIH Stroke scale: Glascow Coma Scale:  Past Medical History  Past Medical History:  Diagnosis Date  . Allergic rhinitis   . Arthritis    carpal tunnel, L hand gout, arthritis - spine   . 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 Cervical myelopathy (  HCC)   . Chronic low back pain   . Essential hypertension   . GERD (gastroesophageal reflux disease)   . Hay fever   . Kidney calculi    Multiple passed in the past.   . Leukocytosis   . Neuromuscular disorder (HCC)    spondylolisthesis - lumbar region  . Olecranon bursitis 24-Apr-202017   Dr. Percell Miller  . Physical deconditioning   . Prediabetes 2015   HbA1c 6.4%: pt saw nutritionist  . Slow transit constipation   . 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 Vitamin D deficiency     Family History   family history includes Diabetes in his brother and mother; Heart attack in his mother; Heart disease in his father and mother; Hypertension in his  brother and mother; Stroke in his father and mother.  Prior Rehab/Hospitalizations Has the patient had major surgery during 100 days prior to admission? Yes  Cervical surgery 03/2016 and received SN[facility] for about 20 days  Current Medications Per [facility]   Patients Current Diet:  Regular Carb Modified with thin liquids  Precautions / Restrictions Precautions Precautions: Fall Restrictions Weight Bearing Restrictions: Yes   Has the patient had 2 or more falls or a fall with injury in the past year?No  Prior Activity Level Community (5-7x/wk): not driving since last year Wears AFO in community. Does not wear in home for he wears nonskid socks in home, not shoes. Uses Rollator and ambulates about 100 feet before he tires. Does own laundry and adls. Uses Rollator to get into shower and then sits in shower seat. Neighbor couple provides meals daily.  Prior Functional Level Self Care: Did the patient need help bathing, dressing, using the toilet or eating?  Independent  Indoor Mobility: Did the patient need assistance with walking from room to room (with or without device)? Independent  Stairs: Did the patient need assistance with internal or external stairs (with or without device)? Independent  Functional Cognition: Did the patient need help planning regular tasks such as shopping or remembering to take medications? Independent  Home Assistive Devices / Equipment Home Assistive Devices/Equipment: CPAP, Walker (specify type)  Prior Device Use: Indicate devices/aids used by the patient prior to current illness, exacerbation or injury? Walker. Pt states not using AFOs in the home.   Prior Functional Level Current Functional Level  Bed Mobility  Independent  Min assist   Transfers  Mod Independent  Mod assist (with stedy) of 2 people   Mobility - Walk/Wheelchair  Mod Independent   (no ambulation yet)   Upper Body Dressing  Independent  Min assist    Lower Body Dressing  Independent  Total assist   Grooming  Independent  Independent   Eating/Drinking  Independent  Independent   Toilet Transfer  Independent  Mod assist   Bladder Continence   continent  indwelling catheter   Bowel Management  continent  LBM 9/7 continent   Stair Climbing  Independent   (not attempted)   Communication  independent  independent   Memory  intact  intact; very talkative   Cooking/Meal Prep  neighbor couple provides meals daily      Housework  pt does own laundry and finances    Money Management  independent    Driving  Has not driven since last year      Special needs/care consideration BiPAP/CPAP yes CPM Continuous Drip IV Dialysis Life Vest Oxygen Special Bed Trach Size Wound Vac (area) Skin intact Bowel mgmt:LBM  9/7 continent Bladder mgmt:indwelling catheter Diabetic mgmt Hgb A1c 6.2  Previous Home Environment Living Arrangements: Alone  Lives With: Alone Available Help at Discharge: Family, Available PRN/intermittently (brother and family can give close to 24/7 supervision for a ) Type of Home: House Home Layout: Two level, Able to live on main level with bedroom/bathroom Alternate Level Stairs-Rails:  (does not have to go upstairs) Home Access: Stairs to enter Entrance Stairs-Rails: Left Entrance Stairs-Number of Steps: 3 Bathroom Shower/Tub: Multimedia programmer: Handicapped height Bathroom Accessibility: Yes How Accessible: Accessible via walker [place]: Yes Type of [place] (if known): [facility] Additional Comments: has 3:1 in shower and another over toilet; has high bed, some piece of sheeting on first two steps as ramp, so leaves one step up at top, uses rollator, wears AFOs in community but none in house, plans to d/c to rehab at this time  Discharge Living Setting Plans for Discharge Living Setting: Patient's home, Alone,  House Type of Home at Discharge: House Discharge Home Layout: Two level, Able to live on main level with bedroom/bathroom Alternate Level Stairs-Rails:  (does not have to go upstairs) Discharge Home Access: Stairs to enter Entrance Stairs-Rails: Left Entrance Stairs-Number of Steps: 3 Discharge Bathroom Shower/Tub: Walk-in shower Discharge Bathroom Toilet: Handicapped height Discharge Bathroom Accessibility: Yes How Accessible: Accessible via walker Does the patient have any problems obtaining your medications?: No  Social/Family/Support Systems Patient Roles: Parent (wife died 36) Contact Information: Ethan Rose, daughter Anticipated Caregiver: daughter, brother and other family members Anticipated Caregiver's Contact Information: see above Ability/Limitations of Caregiver: daughter works, brother can assist short term Caregiver Availability:  (close to 24/7 short term) Discharge Plan Discussed with Primary Caregiver: Yes Is Caregiver In Agreement with Plan?: Yes Does Caregiver/Family have Issues with Lodging/Transportation while Pt is in Rehab?: No  Goals/Additional Needs Patient/Family Goal for Rehab: Mod I to supervision with PT and OT Expected length of stay: ELOS 10-14 days Pt/Family Agrees to Admission and willing to participate: Yes Program Orientation Provided & Reviewed with Pt/Caregiver Including Roles  & Responsibilities: Yes  Patient Condition: I have met with patient at bedside and reviewed his medical records. Patient has sepsis and now is debilitated. Patient is mod to max assist with PT and OT with STEDY equipment Unable to ambulate at present He will benefit from the coordinated team of Medical, Rehab Nursing and PT and OT with an inpatient acute rehabilitation admission. He will benefit form three hours per day of therapy. I have discussed and reviewed case with the Rehab MD and have approval for admission today.  Preadmission Screen Completed By:  Cleatrice Burke, 07/22/2016 10:20 AM ______________________________________________________________________   Discussed status with Dr. Naaman Plummer on 07/22/2016 at 24 and received telephone approval for admission today.  Admission Coordinator:  Cleatrice Burke, time  9024 Date  07/22/2016   Assessment/Plan: Diagnosis: debility related to sepsis and multiple medical 1. Does the need for close, 24 hr/day  Medical supervision in concert with the patient's rehab needs make it unreasonable for this patient to be served in a less intensive setting? Yes 2. Co-Morbidities requiring supervision/potential complications: cervical myelopathy, htn, GERD,  3. Due to bladder management, bowel management, safety, skin/wound care, disease management, medication administration, pain management and patient education, does the patient require 24 hr/day rehab nursing? Yes 4. Does the patient require coordinated care of a physician, rehab nurse, PT (1-2 hrs/day, 5 days/week) and OT (1-2 hrs/day, 5  days/week) to address physical and functional deficits in the context of the above medical diagnosis(es)? Yes Addressing deficits in the following areas: balance, endurance, locomotion, strength, transferring, bowel/bladder control, bathing, dressing, feeding, grooming, toileting and psychosocial support 5. Can the patient actively participate in an intensive therapy program of at least 3 hrs of therapy 5 days a week? Yes 6. The potential for patient to make measurable gains while on inpatient rehab is excellent 7. Anticipated functional outcomes upon discharge from inpatients are: modified independent PT, modified independent OT, n/a SLP 8. Estimated rehab length of stay to reach the above functional goals is: 10-14 days 9. Does the patient have adequate social supports to accommodate these discharge functional goals? Yes 10. Anticipated D/C setting: Home 11. Anticipated post D/C treatments: HH therapy and Outpatient  therapy 12. Overall Rehab/Functional Prognosis: excellent    RECOMMENDATIONS: This patient's condition is appropriate for continued rehabilitative care in the following setting: CIR Patient has agreed to participate in recommended program. Yes Note that insurance prior authorization may be required for reimbursement for recommended care.  Comment: admitting to inpatient rehab today  Meredith Staggers, MD, [facility] 07/22/2016   Cleatrice Burke 07/22/2016

## 2016-07-22 ENCOUNTER — Inpatient Hospital Stay (HOSPITAL_COMMUNITY)
Admission: RE | Admit: 2016-07-22 | Discharge: 2016-08-10 | DRG: 092 | Disposition: A | Discharge status: Home Health | Payer: Medicare Other | Source: Intra-hospital | Attending: Physical Medicine & Rehabilitation | Admitting: Physical Medicine & Rehabilitation

## 2016-07-22 DIAGNOSIS — K219 Gastro-esophageal reflux disease without esophagitis: Secondary | ICD-10-CM | POA: Diagnosis present

## 2016-07-22 DIAGNOSIS — M21371 Foot drop, right foot: Secondary | ICD-10-CM | POA: Diagnosis present

## 2016-07-22 DIAGNOSIS — M21372 Foot drop, left foot: Secondary | ICD-10-CM | POA: Diagnosis present

## 2016-07-22 DIAGNOSIS — M4316 Spondylolisthesis, lumbar region: Secondary | ICD-10-CM | POA: Diagnosis present

## 2016-07-22 DIAGNOSIS — M7022 Olecranon bursitis, left elbow: Secondary | ICD-10-CM | POA: Diagnosis not present

## 2016-07-22 DIAGNOSIS — E46 Unspecified protein-calorie malnutrition: Secondary | ICD-10-CM | POA: Diagnosis present

## 2016-07-22 DIAGNOSIS — G959 Disease of spinal cord, unspecified: Secondary | ICD-10-CM

## 2016-07-22 DIAGNOSIS — Z981 Arthrodesis status: Secondary | ICD-10-CM | POA: Diagnosis not present

## 2016-07-22 DIAGNOSIS — B9620 Unspecified Escherichia coli [E. coli] as the cause of diseases classified elsewhere: Secondary | ICD-10-CM | POA: Diagnosis present

## 2016-07-22 DIAGNOSIS — R7881 Bacteremia: Secondary | ICD-10-CM | POA: Diagnosis present

## 2016-07-22 DIAGNOSIS — N132 Hydronephrosis with renal and ureteral calculous obstruction: Secondary | ICD-10-CM | POA: Diagnosis present

## 2016-07-22 DIAGNOSIS — Z79899 Other long term (current) drug therapy: Secondary | ICD-10-CM | POA: Diagnosis not present

## 2016-07-22 DIAGNOSIS — R2689 Other abnormalities of gait and mobility: Secondary | ICD-10-CM | POA: Diagnosis present

## 2016-07-22 DIAGNOSIS — Z87891 Personal history of nicotine dependence: Secondary | ICD-10-CM | POA: Diagnosis not present

## 2016-07-22 DIAGNOSIS — R5381 Other malaise: Secondary | ICD-10-CM | POA: Diagnosis present

## 2016-07-22 DIAGNOSIS — R7303 Prediabetes: Secondary | ICD-10-CM | POA: Diagnosis present

## 2016-07-22 DIAGNOSIS — R29898 Other symptoms and signs involving the musculoskeletal system: Secondary | ICD-10-CM | POA: Diagnosis present

## 2016-07-22 LAB — BASIC METABOLIC PANEL
ANION GAP: 8 (ref 5–15)
BUN: 19 mg/dL (ref 6–20)
CALCIUM: 8 mg/dL — AB (ref 8.9–10.3)
CO2: 29 mmol/L (ref 22–32)
Chloride: 100 mmol/L — ABNORMAL LOW (ref 101–111)
Creatinine, Ser: 0.69 mg/dL (ref 0.61–1.24)
GLUCOSE: 133 mg/dL — AB (ref 65–99)
Potassium: 3.8 mmol/L (ref 3.5–5.1)
Sodium: 137 mmol/L (ref 135–145)

## 2016-07-22 LAB — MAGNESIUM: Magnesium: 1.9 mg/dL (ref 1.7–2.4)

## 2016-07-22 MED ORDER — FLEET ENEMA 7-19 GM/118ML RE ENEM: 1.0000 | ENEMA | Freq: Once | RECTAL | Status: DC | PRN

## 2016-07-22 MED ORDER — SULFAMETHOXAZOLE-TRIMETHOPRIM 800-160 MG PO TABS: 1.0000 | ORAL_TABLET | Freq: Two times a day (BID) | ORAL | Status: DC

## 2016-07-22 MED ORDER — TRAZODONE HCL 50 MG PO TABS: 25.0000 mg | ORAL_TABLET | Freq: Every evening | ORAL | Status: DC | PRN

## 2016-07-22 MED ORDER — DIPHENHYDRAMINE HCL 12.5 MG/5ML PO ELIX: 12.5000 mg | ORAL_SOLUTION | Freq: Four times a day (QID) | ORAL | Status: DC | PRN

## 2016-07-22 MED ORDER — PROCHLORPERAZINE 25 MG RE SUPP: 12.5000 mg | Freq: Four times a day (QID) | RECTAL | Status: DC | PRN

## 2016-07-22 MED ORDER — SACCHAROMYCES BOULARDII 250 MG PO CAPS
250.0000 mg | ORAL_CAPSULE | Freq: Every day | ORAL | Status: DC
  Administered 2016-07-23 – 2016-08-10 (×19): 250 mg via ORAL
  Filled 2016-07-22 (×19): qty 1

## 2016-07-22 MED ORDER — GUAIFENESIN-DM 100-10 MG/5ML PO SYRP: 5.0000 mL | ORAL_SOLUTION | Freq: Four times a day (QID) | ORAL | Status: DC | PRN

## 2016-07-22 MED ORDER — PROCHLORPERAZINE EDISYLATE 5 MG/ML IJ SOLN: 5.0000 mg | Freq: Four times a day (QID) | INTRAMUSCULAR | Status: DC | PRN

## 2016-07-22 MED ORDER — ALUM & MAG HYDROXIDE-SIMETH 200-200-20 MG/5ML PO SUSP: 30.0000 mL | ORAL | Status: DC | PRN

## 2016-07-22 MED ORDER — BISACODYL 10 MG RE SUPP: 10.0000 mg | Freq: Every day | RECTAL | Status: DC | PRN

## 2016-07-22 MED ORDER — ENOXAPARIN SODIUM 40 MG/0.4ML ~~LOC~~ SOLN
40.0000 mg | SUBCUTANEOUS | Status: DC
  Administered 2016-07-22 – 2016-08-09 (×19): 40 mg via SUBCUTANEOUS
  Filled 2016-07-22 (×19): qty 0.4

## 2016-07-22 MED ORDER — ACETAMINOPHEN 325 MG PO TABS: 325.0000 mg | ORAL_TABLET | ORAL | Status: DC | PRN

## 2016-07-22 MED ORDER — PROCHLORPERAZINE MALEATE 5 MG PO TABS: 5.0000 mg | ORAL_TABLET | Freq: Four times a day (QID) | ORAL | Status: DC | PRN

## 2016-07-22 MED ORDER — SULFAMETHOXAZOLE-TRIMETHOPRIM 800-160 MG PO TABS: 1.0000 | ORAL_TABLET | Freq: Two times a day (BID) | ORAL | 0 refills | Status: DC

## 2016-07-22 MED ORDER — SULFAMETHOXAZOLE-TRIMETHOPRIM 800-160 MG PO TABS
1.0000 | ORAL_TABLET | Freq: Two times a day (BID) | ORAL | Status: AC
  Administered 2016-07-22 – 2016-08-01 (×21): 1 via ORAL
  Filled 2016-07-22 (×22): qty 1

## 2016-07-22 MED ORDER — ADULT MULTIVITAMIN W/MINERALS CH
1.0000 | ORAL_TABLET | Freq: Every day | ORAL | Status: DC
  Administered 2016-07-23 – 2016-08-10 (×19): 1 via ORAL
  Filled 2016-07-22 (×24): qty 1

## 2016-07-22 NOTE — Discharge Instructions (Signed)
Bacteremia °Bacteremia is the presence of bacteria in the blood. A small amount of bacteria may not cause any symptoms. °Sometimes, the bacteria spread and cause infection in other parts of the body, such as the heart, joints, bones, or brain. Having a great amount of bacteria can cause a serious, sometimes life-threatening infection called sepsis. °CAUSES °This condition is caused by bacteria that get into the blood. Bacteria can enter the blood: °· During a dental or medical procedure. °· After you brush your teeth so hard that the gums bleed. °· Through a scrape or cut on your skin. °More severe types of bacteremia can be caused by: °· A bacterial infection, such as pneumonia, that spreads to the blood. °· Using a dirty needle. °RISK FACTORS °This condition is more likely to develop in: °· Children and elderly adults. °· People who have a long-lasting (chronic) disease or medical condition. °· People who have an artificial joint or heart valve. °· People who have heart valve disease. °· People who have a tube, such as a catheter or IV tube, that has been inserted for a medical treatment. °· People who have a weak body defense system (immune system). °· People who use IV drugs. °SYMPTOMS °Usually, this condition does not cause symptoms when it is mild. When it is more serious, it may cause: °· Fever. °· Chills. °· Racing heart. °· Shortness of breath. °· Dizziness. °· Weakness. °· Confusion. °· Nausea or vomiting. °· Diarrhea. °Bacteremia that has spread to other parts of the body may cause symptoms in those areas. °DIAGNOSIS °This condition may be diagnosed with a physical exam and tests, such as: °· A complete blood count (CBC). This test looks for signs of infection. °· Blood cultures. These look for bacteria in your blood. °· Tests of any IV tubes. These look for a source of infection. °· Urine tests. °· Imaging tests, such as an X-ray, CT scan, MRI, or heart ultrasound. °TREATMENT °If the condition is mild,  treatment is usually not needed. Usually, the body's immune system will remove the bacteria. If the condition is more serious, it may be treated with: °· Antibiotic medicines through an IV tube. These may be given for about 2 weeks. At first, the antibiotic that is given may kill most types of blood bacteria. If your test results show that a certain kind of bacteria is causing problems, the antibiotic may be changed to kill only the bacteria that are causing problems. °· Antibiotics taken by mouth. °· Removing any catheter or IV tube that is a source of infection. °· Blood pressure and breathing support, if needed. °· Surgery to control the source or spread of infection, if needed. °HOME CARE INSTRUCTIONS °· Take over-the-counter and prescription medicines only as told by your health care provider. °· If you were prescribed an antibiotic, take it as told by your health care provider. Do not stop taking the antibiotic even if you start to feel better. °· Rest at home until your condition is under control. °· Drink enough fluid to keep your urine clear or pale yellow. °· Keep all follow-up visits as told by your health care provider. This is important. °PREVENTION °Take these actions to help prevent future episodes of bacteremia: °· Get all vaccinations as recommended by your health care provider. °· Clean and cover scrapes or cuts. °· Bathe regularly. °· Wash your hands often. °· Before any dental or surgical procedure, ask your health care provider if you should take an antibiotic. °SEEK MEDICAL   CARE IF: °· Your symptoms get worse. °· You continue to have symptoms after treatment. °· You develop new symptoms after treatment. °SEEK IMMEDIATE MEDICAL CARE IF: °· You have chest pain or trouble breathing. °· You develop confusion, dizziness, or weakness. °· You develop pale skin. °  °This information is not intended to replace advice given to you by your health care provider. Make sure you discuss any questions you have  with your health care provider. °  °Document Released: 08/14/2006 Document Revised: 07/22/2015 Document Reviewed: 01/03/2015 °Elsevier Interactive Patient Education ©2016 [employer] ° °

## 2016-07-22 NOTE — Progress Notes (Signed)
Report called to Dolores Lory, RN at inpatient rehab.  [facility] aware of 1pm pick up. Andre Lefort

## 2016-07-22 NOTE — H&P (Signed)
Physical Medicine and Rehabilitation Admission H&P    Chief Complaint  Patient presents with  . Debility    HPI: Ethan Rose is a 70 year old male with history of  prediabetes, multiple back surgeries with chronic back pain and bilateral foot drop,  cervical myelopathy, nephrolithiasis who was admitted on [facility] on 07/16/16 with pain, fever and generalized malaise due to  sepsis from left ureteral stone with hydronephrosis. He underwent left retrograde pyelography with JJ  Stent placement by Dr. Alyson Ingles on 9/2.  UCS/BC positive for E coli and antibiotics narrowed to ceftriaxone. He has effervesced but noted to have rise in WBC to 19.3. Repeat [facility] 9/4 negative so far and case discussed with Dr. Megan Salon who felt that current treatment appropriate. Antibiotics changed to bactrim today with recommendations for additional days antibiotic Tx.  Therapy ongoing and patient noted to be severely debilitated with inability to walk. CIR recommended for follow up therapy.      Review of Systems  Constitutional: Negative for chills and fever.  HENT: Negative for hearing loss.   Eyes: Negative for blurred vision and double vision.  Respiratory: Positive for cough and shortness of breath.   Cardiovascular: Negative for chest pain and palpitations.  Gastrointestinal: Negative for abdominal pain, diarrhea, heartburn, nausea and vomiting.  Genitourinary: Negative for dysuria.  Musculoskeletal: Negative for back pain, myalgias and neck pain.  Skin: Negative for rash.  Neurological: Positive for dizziness and focal weakness. Negative for headaches.  Endo/Heme/Allergies: Negative.   Psychiatric/Behavioral: Negative for depression and suicidal ideas.      Past Medical History:  Diagnosis Date  . Allergic rhinitis   . Arthritis    carpal tunnel, L hand gout, arthritis - spine   . 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 Cervical myelopathy ([place])   . Chronic low back pain   . Essential  hypertension   . GERD (gastroesophageal reflux disease)   . Hay fever   . Kidney calculi    Multiple passed in the past.   . Leukocytosis   . Neuromuscular disorder (H[facility])    spondylolisthesis - lumbar region  . Olecranon bursitis 07-24-2016   Dr. Percell Miller  . Physical deconditioning   . Prediabetes 2015   HbA1c 6.4%: pt saw nutritionist  . Slow transit constipation   . 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 Vitamin D deficiency     Past Surgical History:  Procedure Laterality Date  . ABDOMINAL EXPOSURE N/A 03/24/2015   Procedure: ABDOMINAL EXPOSURE;  Surgeon: [name], MD;  Location: [facility] NEURO ORS;  Service: Vascular;  Laterality: N/A;  left side approach  . ANTERIOR CERVICAL DECOMP/DISCECTOMY FUSION N/A 03/18/2016   Procedure: Cervical four-five Cervical five-six Cervical six-seven Anterior cervical decompression/diskectomy/fusion;  Surgeon: [name], MD;  Location: [place] NEURO ORS;  Service: Neurosurgery;  Laterality: N/A;  n  . ANTERIOR LAT LUMBAR FUSION Right 03/24/2015   Procedure: Right Lumbar two-Three,Lumbar Three-Four,Lumbar Four-Five Anterior lateral lumbar interbody fusion ;  Surgeon: [name], MD;  Location: [place] NEURO ORS;  Service: Neurosurgery;  Laterality: Right;  right  . COLONOSCOPY  approx 2011   Dr. [name] (normal per pt report)  . CYSTOSCOPY WITH STENT PLACEMENT Left 07/16/2016   Procedure: CYSTOSCOPY, RETROGRADE WITH LEFT URETERAL STENT PLACEMENT;  Surgeon: [name], MD;  Location: [facility] ORS;  Service: Urology;  Laterality: Left;  . HERNIA REPAIR  1993   Bilateral inguinal (urologist did these procedures)  . LITHOTRIPSY  Multiple; most recent was spring 2015  . POSTERIOR LUMBAR FUSION 4 LEVEL N/A 03/27/2015   Procedure: Posterior pedicle subtraction osteotomy with T10 to Iliac fusion with Dr. [name] assisting;  Surgeon: [name], MD;  Location: [facility]S;  Service: Neurosurgery;  Laterality: N/A;  Posterior pedicle  subtraction osteotomy with T10 to Iliac fusion with Dr. [name] assisting  . TONSILLECTOMY  1954    Family History  Problem Relation Age of Onset  . Stroke Mother   . Diabetes Mother   . Heart disease Mother   . Hypertension Mother   . Heart attack Mother   . Stroke Father   . Heart disease Father   . Diabetes Brother   . Hypertension Brother     Social History:  Lives alone and was independent PTA. Able to ambulate short distances with rollator and B-AFOs.  Has been unable to drive since neck [id number]. He  reports that he quit smoking about 2 years ago. His smoking use included Cigarettes. He smoked 0.50 packs per day. He has quit using smokeless tobacco. His smokeless tobacco use included Chew. He reports that he drinks about 4.8 oz of alcohol per week . He reports that he does not use drugs.    Allergies: No Known Allergies    Medications Prior to Admission  Medication Sig Dispense Refill  . Cholecalciferol 5000 UNITS capsule Take 5,000 Units by mouth daily.    Marland Kitchen HYDROcodone-acetaminophen (NORCO/VICODIN) 5-325 MG tablet Take 1-2 tablets by mouth every 4 (four) hours as needed for moderate pain or severe pain (mild pain). 60 tablet 0  . Multiple Vitamins-Minerals (MULTIVITAMIN WITH MINERALS) tablet Take 1 tablet by mouth daily.    Marland Kitchen OVER THE COUNTER MEDICATION Take 1 Dose by mouth daily. Over the counter OPC3 and Isochrome powders 1 capful of both mixed in 4 ounces of water daily    . OVER THE COUNTER MEDICATION Take 1 tablet by mouth daily. BP Platinum    . OVER THE COUNTER MEDICATION Take 1 capsule by mouth daily. neuroquell    . OVER THE COUNTER MEDICATION Take 1 capsule by mouth 2 (two) times daily. Nerve renew    . PAPAYA PO Take 3 tablets by mouth daily as needed (heartburn).     . saccharomyces boulardii (FLORASTOR) 250 MG capsule Take 250 mg by mouth daily.     . Zinc Methionate 50 MG CAPS Take 50 mg by mouth daily.      Home: Home Living Family/patient  expects to be discharged to:: Inpatient rehab Living Arrangements: Alone Available Help at Discharge: Family, Available PRN/intermittently (brother and family can give close to 24/7 supervision for a ) Type of Home: House Home Access: Stairs to enter [employer] of Steps: 3 Entrance Stairs-Rails: Left Home Layout: Two level, Able to live on main level with bedroom/bathroom Alternate Level Stairs-Rails:  (does not have to go upstairs) Bathroom Shower/Tub: Multimedia programmer: Handicapped height Bathroom Accessibility: Yes Additional Comments: has 3:1 in shower and another over toilet; has high bed, some piece of sheeting on first two steps as ramp, so leaves one step up at top, uses rollator, wears AFOs in community but none in house, plans to d/c to rehab at this time  Lives With: Alone   Functional History: Prior Function Level of Independence: Independent with assistive device(s) Comments: states has worked hard in PT from [place] [facility] after both spinal surgeries, to HHPT with lots of HEP with stationary bike, leg and arm weights  over past year, able to ambulate shorter distances with rollator; no longer drives since cervical spine surgery  Functional Status:  Mobility: Bed Mobility Overal bed mobility: Needs Assistance Bed Mobility: Sit to Supine Supine to sit: Mod assist, HOB elevated Sit to supine: Min assist General bed mobility comments: pt required assist to bring R LE onto the bed; pt attempted to self assist but struggled bringing legs up onto the bed and lowering to supine in a controlled fashion; pt was uncoordinated in all movements Transfers Overall transfer level: Needs assistance Equipment used: None Transfer via Lift Equipment: Stedy Transfers: Sit to/from Stand Sit to Stand: +2 physical assistance, Max assist  Lateral/Scoot Transfers: Min assist General transfer comment: pt initially used Stedy in backwards position for support and to pull up  on to stand; knees were blocked, but pt did not buckle; pt then used Stedy in standard position for transfer to bed due to fatigue; pt was able to stand for approx. 1 min, however could not march in place, so [name] was used for transfer because of this Ambulation/Gait Ambulation/Gait assistance:  (not safe to ambulate at this time due to weakness)    ADL: ADL Overall ADL's : Needs assistance/impaired Eating/Feeding: Set up, Sitting Grooming: Set up, Sitting Upper Body Bathing: Set up, Minimal assitance, Sitting Lower Body Bathing: Maximal assistance, Cueing for safety, Cueing for sequencing, Cueing for compensatory techniques Lower Body Bathing Details (indicate cue type and reason): pt able to stand on steady while OT washed back and bottom. Pt able to stand on steady for appox 1 min prior to needing a break.  Pt performed 5 sit to stands on steady  with mod A to stand. Pt very dependent on arms for transition. Upper Body Dressing : Set up, Sitting Lower Body Dressing: Cueing for safety, Cueing for sequencing, Maximal assistance Toilet Transfer: +2 for physical assistance, Maximal assistance, Cueing for safety, Cueing for sequencing Toilet Transfer Details (indicate cue type and reason): sit to stand only Toileting- Clothing Manipulation and Hygiene: Maximal assistance Toileting - Clothing Manipulation Details (indicate cue type and reason): OT used steady to get pt from bed to chair General ADL Comments: used steady to get to chair  Cognition: Cognition Overall Cognitive Status: Within Functional Limits for tasks assessed Orientation Level: Oriented X4 Cognition Arousal/Alertness: Awake/alert Behavior During Therapy: WFL for tasks assessed/performed Overall Cognitive Status: Within Functional Limits for tasks assessed   Blood pressure (!) 143/70, pulse 71, temperature 98.7 F (37.1 C), temperature source Oral, resp. rate 18, height _0  (1.778 m), weight 72.5 kg (159 lb 12.8 oz),  SpO2 97 %. Physical Exam  Constitutional: He is oriented to person, place, and time. He appears well-developed and well-nourished.  HENT:  Head: Normocephalic.  Eyes: Pupils are equal, round, and reactive to light.  Neck: No tracheal deviation present.  Cardiovascular: Normal rate, regular rhythm and normal heart sounds.   Respiratory: Effort normal.  GI: Soft.  Genitourinary:  Genitourinary Comments: Foley   Musculoskeletal: He exhibits no edema or deformity.  Neurological: He is alert and oriented to person, place, and time. No cranial nerve deficit. Coordination normal.  Motor 4+/5 prox to distal in the UE's. LE: HF 2+, KE 3-, APF 4-, ADF trace. No sensory findings. DTR's 1+  Skin: Skin is warm and dry.  Psychiatric: He has a normal mood and affect. His behavior is normal. Judgment and thought content normal.    Results for orders placed or performed during the hospital encounter of 07/16/16 (from  the past 48 hour(s))  CBC     Status: Abnormal   Collection Time: 07/21/16  5:23 AM  Result Value Ref Range   WBC 19.3 (H) 4.0 - 10.5 K/uL   RBC 3.87 (L) 4.22 - 5.81 MIL/uL   Hemoglobin 11.7 (L) 13.0 - 17.0 g/dL   HCT 33.2 (L) 39.0 - 52.0 %   MCV 85.8 78.0 - 100.0 fL   MCH 30.2 26.0 - 34.0 pg   MCHC 35.2 30.0 - 36.0 g/dL   RDW 15.0 11.5 - 15.5 %   Platelets 180 150 - 400 K/uL  Basic metabolic panel     Status: Abnormal   Collection Time: 07/21/16  5:23 AM  Result Value Ref Range   Sodium 138 135 - 145 mmol/L   Potassium 3.7 3.5 - 5.1 mmol/L   Chloride 101 101 - 111 mmol/L   CO2 31 22 - 32 mmol/L   Glucose, Bld 122 (H) 65 - 99 mg/dL   BUN 15 6 - 20 mg/dL   Creatinine, Ser 0.59 (L) 0.61 - 1.24 mg/dL   Calcium 8.1 (L) 8.9 - 10.3 mg/dL   GFR calc non Af Amer >60 >60 mL/min   GFR calc Af Amer >60 >60 mL/min    Comment: (NOTE) The eGFR has been calculated using the CKD EPI equation. This calculation has not been validated in all clinical situations. eGFR's persistently <60  mL/min signify possible Chronic Kidney Disease.    Anion gap 6 5 - 15  Basic metabolic panel     Status: Abnormal   Collection Time: 07/22/16  5:26 AM  Result Value Ref Range   Sodium 137 135 - 145 mmol/L   Potassium 3.8 3.5 - 5.1 mmol/L   Chloride 100 (L) 101 - 111 mmol/L   CO2 29 22 - 32 mmol/L   Glucose, Bld 133 (H) 65 - 99 mg/dL   BUN 19 6 - 20 mg/dL   Creatinine, Ser 0.69 0.61 - 1.24 mg/dL   Calcium 8.0 (L) 8.9 - 10.3 mg/dL   GFR calc non Af Amer >60 >60 mL/min   GFR calc Af Amer >60 >60 mL/min    Comment: (NOTE) The eGFR has been calculated using the CKD EPI equation. This calculation has not been validated in all clinical situations. eGFR's persistently <60 mL/min signify possible Chronic Kidney Disease.    Anion gap 8 5 - 15  Magnesium     Status: None   Collection Time: 07/22/16  5:26 AM  Result Value Ref Range   Magnesium 1.9 1.7 - 2.4 mg/dL   No results found.     Medical Problem List and Plan: 1.  Mobility and functional deficits secondary to debility after multiple medical complications  -admit to inpatient rehab 2.  DVT Prophylaxis/Anticoagulation: Pharmaceutical: Lovenox 3. Pain Management: tylenol. Pain controlled at present.  4. Mood: team to provide ego support as necessary. Pt appears up beat 5. Neuropsych: This patient is capable of making decisions on his own behalf. 6. Skin/Wound Care: encourage nutrition.   -local skin care as necessary 7. Fluids/Electrolytes/Nutrition: check labs upon admission and as necessary  8. E coli bacteremia/UTI: Continue to monitor for temps/symptoms rather that WBC per ID. Has completed IV antibiotics X  7  Days. On Bactrim D # 1/10  9. Hypokalemia:resolved 10. Prediabetes: dietary education 11. Protein calorie malnutrition: 12. 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 The  patient doesn't  exactly recall either. He does have a history of cervical stenosis, myelopathy, and subsequent surgery as wel.  -asked him to bring in his AFO's at home (used them sporadically)  -apparently used a steppage gait pattern around the house/community  -might be able to make some adjustments or replace his current AFO's     Post Admission Physician Evaluation: 1. Functional deficits secondary  to debility after multiple medical. 2. Patient is admitted to receive collaborative, interdisciplinary care between the physiatrist, rehab nursing staff, and therapy team. 3. Patient's level of medical complexity and substantial therapy needs in context of that medical necessity cannot be provided at a lesser intensity of care such as a SNF. 4. Patient has experienced substantial functional loss from his/her baseline which was documented above under the "Functional History" and "Functional Status" headings.  Judging by the patient's diagnosis, physical exam, and functional history, the patient has potential for functional progress which will result in measurable gains while on inpatient rehab.  These gains will be of substantial and practical use upon discharge  in facilitating mobility and self-care at the household level. 5. Physiatrist will provide 24 hour management of medical needs as well as oversight of the therapy plan/treatment and provide guidance as appropriate regarding the interaction of the two. 6. 24 hour rehab nursing will assist with bladder management, bowel management, safety, skin/wound care, disease management, medication administration, pain management and patient education  and help integrate therapy concepts, techniques,education, etc. 7. PT will assess and treat for/with: Lower extremity strength, range of motion, stamina, balance, functional mobility, safety, adaptive techniques and equipment, NMR, stamina, orthotic assessment/adaptation, community reintegration.   Goals are: supervision to  min assist. 8. OT will assess and treat for/with: ADL's, functional mobility, safety, upper extremity strength, adaptive techniques and equipment, NMR, community reintegration, ego support, .   Goals are: supervision to min assist. Therapy may proceed with showering this patient. 9. SLP will assess and treat for/with: n/a.  Goals are: n/a. 10. Case Management and Social Worker will assess and treat for psychological issues and discharge planning. 11. Team conference will be held weekly to assess progress toward goals and to determine barriers to discharge. 12. Patient will receive at least 3 hours of therapy per day at least 5 days per week. 13. ELOS: 13-18 days       14. Prognosis:  excellent     Meredith Staggers, MD, [place] [facility] 07/22/2016  07/22/2016

## 2016-07-22 NOTE — Care Management Note (Signed)
Case Management Note  Patient Details  Name: AQEEL RELYEA MRN: [id number] Date of Birth: 07/15/1946  Subjective/Objective: Per IP rehab coordin-accepted to CIR-see note.                   Action/Plan:d/c IP rehab.   Expected Discharge Date:                  Expected Discharge Plan:  [place]  In-House Referral:     Discharge planning Services  CM Consult  Post Acute Care Choice:    Choice offered to:     DME Arranged:    DME Agency:     HH Arranged:    [place] Agency:     Status of Service:  Completed, signed off  If discussed at [employer] of Stay Meetings, dates discussed:    Additional Comments:  Dessa Phi, RN 07/22/2016, 10:01 AM

## 2016-07-22 NOTE — Progress Notes (Signed)
OT Cancellation Note  Patient Details Name: Ethan Rose MRN: [id number] DOB: [birth year]   Cancelled Treatment:    Reason Eval/Treat Not Completed: Fatigue/lethargy limiting ability to participate;Other (comment). Pt states that he was tired from bathing from nurse tech this morning and and that he would rather save his energy for transfer to CIR today. Pt;s RN confirmed CIR transfer for early afternoon  Britt Bottom 07/22/2016, 10:28 AM

## 2016-07-22 NOTE — Interval H&P Note (Signed)
Ethan Rose was admitted today to Inpatient Rehabilitation with the diagnosis of debility after sepsis/multiple medical.  The patient's history has been reviewed, patient examined, and there is no change in status.  Patient continues to be appropriate for intensive inpatient rehabilitation.  I have reviewed the patient's chart and labs.  Questions were answered to the patient's satisfaction. The PAPE has been reviewed and assessment remains appropriate.  Tameria Patti T 07/22/2016, 6:04 PM

## 2016-07-22 NOTE — Discharge Summary (Signed)
Physician Discharge Summary  Ethan Rose NWG:[id number] DOB: Oct 12, 1946 DOA: 07/16/2016  PCP: Blanchie Serve, MD  Admit date: 07/16/2016 Discharge date: 07/22/2016  Time spent: 45 minutes  Recommendations for Outpatient Follow-up:  Patient will be discharged to inpatient rehab.  Patient will need to follow up with primary care provider within one week of discharge.  Repeat CBC and BMP in one week.  Follow up with urology, Dr. Junious Silk, in 2-3 weeks. Patient should continue medications as prescribed.  Patient should follow a regular diet.   Discharge Diagnoses:  Principal Problem:   Severe sepsis with septic shock [facility]) Active Problems:   Pyelonephritis   Left ureteral stone   Septic shock (HCC)   E coli bacteremia   Leukocytosis   Pressure ulcer   Discharge Condition: Stable  Diet recommendation: Regular  Filed Weights   07/21/16 0527 07/21/16 1534 07/22/16 0445  Weight: 73.9 kg (162 lb 14.7 oz) 73.9 kg (162 lb 14.7 oz) 72.5 kg (159 lb 12.8 oz)    History of present illness:  on 07/16/2016 by Dr. Andy Gauss Simpsonis a 70 y.o.gentleman with a history of nephrolithiasis s/p lithotripsy x 4 in the past, prior cervical and lumbar spine surgeries, and GERD who presented to the ED in [facility] for evaluation of two days of progressive left flank pain associated with fever to 102 (documented at home) and generalized weakness. Symptoms started Thursday morning. Pain and fever were refractory to advil and hydrocodone. He attempted to get an appointment with his primary urologist on Friday, but he was unsuccessful. He denies dysuria or hematuria. No nausea or vomiting. No syncope.  Hospital Course:  Severe sepsis with septic shock due to E.Coli bacteremia and pyelonephritis -Sepsis criteria met on admission, source likely E.Coli bacteremia and E.Coli UTI -Patient did require short course on pressor support (neosynephrine) after stent placement  -Initially on vanco and  zosyn, transitioned to Rocephin based on blood culture results which are growing E.Coli sens to Rocephin  -Urine culture also growing E.Coli -Repeat blood cultures 9/4 shows no growth to date -Leukocytosis is improving from admission, however mildly elevated from yesterday. Currently 42 -Spoke with Dr. Michel Bickers, Infectious Disease, on 9/7. He recommended to cease obtaining CBC and following a lab values.  Agreed with assessing the patient and clinical course, not following WBC.  The patient is on appropriate treatment. Currently afebrile. No diarrhea or shortness of breath/cough.  -Will discharge with bactrim.  Have BMP in one week  Pyelonephritis / Left ureteral stone -S/P stent placement  -Urology consulted and appreciated  Thrombocytopenia -Due to sepsis -No reports of bleeding -Platelets improving, currently 180  Normocytic anemia -Hemoglobin stable at 11.7  Acute kidney injury -Resolved, secondary to sepsis and pyelonephritis  -Creatinine currently 0.69  Hypokalemia -Resolved, Due to sepsis -Continue to supplement and monitor BMP -magnesium level was 1.4- after replacement 1.9  Stage 1 pressure ulcer -Over sacrum, per RN care   Consultants Urology  [facility] Infectious disease, Dr. Megan Salon, via phone  Procedures  Stent placement 07/16/16  Discharge Exam: Vitals:   07/21/16 2112 07/22/16 0445  BP: 140/65 (!) 143/70  Pulse: 83 71  Resp: 20 18  Temp: 98.2 F (36.8 C) 98.7 F (37.1 C)   Exam  General: Well developed, well nourished, NAD  HEENT: NCAT, mucous membranes moist.   Cardiovascular: S1 S2 auscultated, RRR, no murmurs appreciated  Respiratory: Clear to auscultation bilaterally with equal chest rise  Abdomen: Soft, nontender, nondistended, + bowel sounds  Extremities:  warm dry without cyanosis clubbing or edema  Neuro: AAOx3, nonfocal   Psych: Normal affect and demeanor with intact judgement and insight, pleasant  Discharge  Instructions Discharge Instructions    Discharge instructions    Complete by:  As directed   Patient will be discharged to inpatient rehab.  Patient will need to follow up with primary care provider within one week of discharge.  Repeat CBC and BMP in one week.  Follow up with urology, Dr. Junious Silk, in 2-3 weeks. Patient should continue medications as prescribed.  Patient should follow a regular diet.     Current Discharge Medication List    START taking these medications   Details  sulfamethoxazole-trimethoprim (BACTRIM DS,SEPTRA DS) 800-160 MG tablet Take 1 tablet by mouth 2 (two) times daily. Qty: 20 tablet, Refills: 0      CONTINUE these medications which have NOT CHANGED   Details  Cholecalciferol 5000 UNITS capsule Take 5,000 Units by mouth daily.    HYDROcodone-acetaminophen (NORCO/VICODIN) 5-325 MG tablet Take 1-2 tablets by mouth every 4 (four) hours as needed for moderate pain or severe pain (mild pain). Qty: 60 tablet, Refills: 0    Multiple Vitamins-Minerals (MULTIVITAMIN WITH MINERALS) tablet Take 1 tablet by mouth daily.    !! OVER THE COUNTER MEDICATION Take 1 Dose by mouth daily. Over the counter OPC3 and Isochrome powders 1 capful of both mixed in 4 ounces of water daily    !! OVER THE COUNTER MEDICATION Take 1 tablet by mouth daily. BP Platinum    !! OVER THE COUNTER MEDICATION Take 1 capsule by mouth daily. neuroquell    !! OVER THE COUNTER MEDICATION Take 1 capsule by mouth 2 (two) times daily. Nerve renew    PAPAYA PO Take 3 tablets by mouth daily as needed (heartburn).     saccharomyces boulardii (FLORASTOR) 250 MG capsule Take 250 mg by mouth daily.     Zinc Methionate 50 MG CAPS Take 50 mg by mouth daily.     !! - Potential duplicate medications found. Please discuss with provider.     No Known Allergies Follow-up Information    PANDEY, MAHIMA, MD. Schedule an appointment as soon as possible for a visit in 1 week(s).   Specialty:  Internal  Medicine Why:  After discharge from inpatient rehab Contact information: [place] [address] [phone]        Festus Aloe, MD. Schedule an appointment as soon as possible for a visit in 2 week(s).   Specialty:  Urology Why:  After discharge from Rehab for kideny stones/stent Contact information: [place] [address] [phone]            The results of significant diagnostics from this hospitalization (including imaging, microbiology, ancillary and laboratory) are listed below for reference.    Significant Diagnostic Studies: Ct Renal Stone Study  Result Date: 07/16/2016 CLINICAL DATA:  Left flank pain, fever, history of renal stones status post lithotripsy EXAM: CT ABDOMEN AND PELVIS WITHOUT CONTRAST TECHNIQUE: Multidetector CT imaging of the abdomen and pelvis was performed following the standard protocol without IV contrast. COMPARISON:  02/27/2014 FINDINGS: Lower chest: Trace left pleural effusion. Mild dependent atelectasis in the bilateral lung bases. Hepatobiliary: Mild hepatic steatosis. Gallbladder is unremarkable. No intrahepatic or extrahepatic ductal dilatation. Pancreas: Pancreatic atrophy. Spleen: Within normal limits. Adrenals/Urinary Tract: Adrenal glands are unremarkable. Multiple small bilateral renal calculi measuring up to 3 mm bilaterally. Moderate left hydroureteronephrosis. Associated 8 mm mid left ureteral calculus (  series 2/ image 49). Bladder is within normal limits. Stomach/Bowel: Stomach is within normal limits. No evidence of bowel obstruction. Vascular/Lymphatic: Atherosclerotic calcifications of the abdominal aorta and branch vessels. No evidence of abdominal aortic aneurysm. No suspicious abdominopelvic lymphadenopathy. Reproductive: Prostate is notable for dystrophic calcifications. Other: No abdominopelvic ascites. Tiny fat containing right inguinal hernia (series 2/image 80). Musculoskeletal: Mild posterior  wedging at L1 with associated posterior thoracolumbar fixation extending from T10 through S1. IMPRESSION: 8 mm mid left ureteral calculus. Associated moderate left hydroureteronephrosis. Multiple small nonobstructing bilateral renal calculi measuring up to 3 mm. Mild posterior wedging at L1. Associated posterior thoracolumbar fixation extending from T10 through S1. Additional ancillary findings as above. Electronically Signed   By: Julian Hy M.D.   On: 07/16/2016 13:28    Microbiology: Recent Results (from the past 240 hour(s))  Urine culture     Status: Abnormal   Collection Time: 07/16/16 11:36 AM  Result Value Ref Range Status   Specimen Description URINE, CLEAN CATCH  Final   Special Requests NONE  Final   Culture >=100,000 COLONIES/mL ESCHERICHIA COLI (A)  Final   Report Status 07/19/2016 FINAL  Final   Organism ID, Bacteria ESCHERICHIA COLI (A)  Final      Susceptibility   Escherichia coli - MIC*    AMPICILLIN <=2 SENSITIVE Sensitive     CEFAZOLIN <=4 SENSITIVE Sensitive     CEFTRIAXONE <=1 SENSITIVE Sensitive     CIPROFLOXACIN <=0.25 SENSITIVE Sensitive     GENTAMICIN <=1 SENSITIVE Sensitive     IMIPENEM <=0.25 SENSITIVE Sensitive     NITROFURANTOIN <=16 SENSITIVE Sensitive     TRIMETH/SULFA <=20 SENSITIVE Sensitive     AMPICILLIN/SULBACTAM <=2 SENSITIVE Sensitive     PIP/TAZO <=4 SENSITIVE Sensitive     Extended ESBL NEGATIVE Sensitive     * >=100,000 COLONIES/mL ESCHERICHIA COLI  Blood Culture (routine x 2)     Status: Abnormal   Collection Time: 07/16/16 12:25 PM  Result Value Ref Range Status   Specimen Description BLOOD RIGHT ARM  Final   Special Requests BOTTLES DRAWN AEROBIC AND ANAEROBIC 5CC EACH  Final   Culture  Setup Time   Final    GRAM NEGATIVE RODS IN BOTH AEROBIC AND ANAEROBIC BOTTLES CRITICAL RESULT CALLED TO, READ BACK BY AND VERIFIED WITH: T PICKERING,PHARMD AT 1121 07/17/16 BY L BENFIELD    Culture (A)  Final    ESCHERICHIA COLI SUSCEPTIBILITIES  PERFORMED ON PREVIOUS CULTURE WITHIN THE LAST 5 DAYS. Performed at [facility]    Report Status 07/19/2016 FINAL  Final  Blood Culture (routine x 2)     Status: Abnormal   Collection Time: 07/16/16 12:40 PM  Result Value Ref Range Status   Specimen Description BLOOD LEFT ARM  Final   Special Requests BOTTLES DRAWN AEROBIC AND ANAEROBIC 5CC EACH  Final   Culture  Setup Time   Final    GRAM NEGATIVE RODS IN BOTH AEROBIC AND ANAEROBIC BOTTLES CRITICAL RESULT CALLED TO, READ BACK BY AND VERIFIED WITH: T PICKERING,PHARMD AT 1121 07/17/16 BY L BENFIELD Performed at [place] (A)  Final   Report Status 07/19/2016 FINAL  Final   Organism ID, Bacteria ESCHERICHIA COLI  Final      Susceptibility   Escherichia coli - MIC*    AMPICILLIN <=2 SENSITIVE Sensitive     CEFAZOLIN <=4 SENSITIVE Sensitive     CEFEPIME <=1 SENSITIVE Sensitive     CEFTAZIDIME <=1 SENSITIVE Sensitive  CEFTRIAXONE <=1 SENSITIVE Sensitive     CIPROFLOXACIN <=0.25 SENSITIVE Sensitive     GENTAMICIN <=1 SENSITIVE Sensitive     IMIPENEM <=0.25 SENSITIVE Sensitive     TRIMETH/SULFA <=20 SENSITIVE Sensitive     AMPICILLIN/SULBACTAM <=2 SENSITIVE Sensitive     PIP/TAZO <=4 SENSITIVE Sensitive     Extended ESBL NEGATIVE Sensitive     * ESCHERICHIA COLI  Blood Culture ID Panel (Reflexed)     Status: Abnormal   Collection Time: 07/16/16 12:40 PM  Result Value Ref Range Status   Enterococcus species NOT DETECTED NOT DETECTED Final   Listeria monocytogenes NOT DETECTED NOT DETECTED Final   Staphylococcus species NOT DETECTED NOT DETECTED Final   Staphylococcus aureus NOT DETECTED NOT DETECTED Final   Streptococcus species NOT DETECTED NOT DETECTED Final   Streptococcus agalactiae NOT DETECTED NOT DETECTED Final   Streptococcus pneumoniae NOT DETECTED NOT DETECTED Final   Streptococcus pyogenes NOT DETECTED NOT DETECTED Final   Acinetobacter baumannii NOT DETECTED NOT DETECTED  Final   Enterobacteriaceae species DETECTED (A) NOT DETECTED Final    Comment: CRITICAL RESULT CALLED TO, READ BACK BY AND VERIFIED WITH: T PICKERING,PHARMD AT 1121 07/17/16 BY L BENFIELD    Enterobacter cloacae complex NOT DETECTED NOT DETECTED Final   Escherichia coli DETECTED (A) NOT DETECTED Final    Comment: CRITICAL RESULT CALLED TO, READ BACK BY AND VERIFIED WITH: T PICKERING,PHARMD AT 1121 07/17/16 BY L BENFIELD    Klebsiella oxytoca NOT DETECTED NOT DETECTED Final   Klebsiella pneumoniae NOT DETECTED NOT DETECTED Final   Proteus species NOT DETECTED NOT DETECTED Final   Serratia marcescens NOT DETECTED NOT DETECTED Final   Carbapenem resistance NOT DETECTED NOT DETECTED Final   Haemophilus influenzae NOT DETECTED NOT DETECTED Final   Neisseria meningitidis NOT DETECTED NOT DETECTED Final   Pseudomonas aeruginosa NOT DETECTED NOT DETECTED Final   Candida albicans NOT DETECTED NOT DETECTED Final   Candida glabrata NOT DETECTED NOT DETECTED Final   Candida krusei NOT DETECTED NOT DETECTED Final   Candida parapsilosis NOT DETECTED NOT DETECTED Final   Candida tropicalis NOT DETECTED NOT DETECTED Final    Comment: Performed at [facility]  Culture, blood (routine x 2)     Status: None (Preliminary result)   Collection Time: 07/18/16  6:10 PM  Result Value Ref Range Status   Specimen Description BLOOD RIGHT HAND  Final   Special Requests NONE  Final   Culture   Final    NO GROWTH 4 DAYS Performed at [facility]    Report Status PENDING  Incomplete  Culture, blood (routine x 2)     Status: None (Preliminary result)   Collection Time: 07/18/16  6:10 PM  Result Value Ref Range Status   Specimen Description BLOOD BLOOD LEFT ARM  Final   Special Requests NONE  Final   Culture   Final    NO GROWTH 4 DAYS Performed at [facility]    Report Status PENDING  Incomplete     Labs: Basic Metabolic Panel:  Recent Labs Lab 07/17/16 0521 07/18/16 0853  07/20/16 0528 07/21/16 0523 07/22/16 0526  NA 137 139 138 138 137  K 3.5 3.3* 3.2* 3.7 3.8  CL 110 112* 102 101 100*  CO2 21* _0 GLUCOSE 86 125* 124* 122* 133*  BUN 29* 22* _1 CREATININE 0.92 0.73 0.59* 0.59* 0.69  CALCIUM 7.7* 8.1* 7.9* 8.1* 8.0*  MG  --   --  1.4*  --  1.9   Liver Function Tests:  Recent Labs Lab 07/16/16 1200 07/16/16 2056  AST 41 28  ALT 27 23  ALKPHOS 110 118  BILITOT 0.8 1.0  PROT 6.5 5.5*  ALBUMIN 3.4* 2.6*   No results for input(s): LIPASE, AMYLASE in the last 168 hours. No results for input(s): AMMONIA in the last 168 hours. CBC:  Recent Labs Lab 07/16/16 1200 07/16/16 2253 07/17/16 0521 07/18/16 0853 07/19/16 1106 07/20/16 0528 07/21/16 0523  WBC 19.7* 31.3* 19.9* 14.8* 12.9* 16.8* 19.3*  NEUTROABS 18.9* 29.8*  --   --   --   --   --   HGB 13.3 11.4* 11.0* 11.4* 12.0* 11.6* 11.7*  HCT 38.2* 33.3* 32.3* 33.7* 35.3* 33.5* 33.2*  MCV 88.4 88.8 87.5 88.2 88.0 85.9 85.8  PLT 135* 90* 75* 91* 116* 130* 180   Cardiac Enzymes:  Recent Labs Lab 07/16/16 2056 07/17/16 0521  TROPONINI 0.25* 0.32*   BNP: BNP (last 3 results) No results for input(s): BNP in the last 8760 hours.  ProBNP (last 3 results) No results for input(s): PROBNP in the last 8760 hours.  CBG:  Recent Labs Lab 07/16/16 1743  GLUCAP 85       Signed:  Isley Zinni  [facility] 07/22/2016, 10:08 AM

## 2016-07-22 NOTE — H&P (View-Only) (Signed)
Physical Medicine and Rehabilitation Admission H&P    Chief Complaint  Patient presents with  . Debility    HPI: Ethan Rose is a 70 year old male with history of  prediabetes, multiple back surgeries with chronic back pain and bilateral foot drop,  cervical myelopathy, nephrolithiasis who was admitted on [facility] on 07/16/16 with pain, fever and generalized malaise due to  sepsis from left ureteral stone with hydronephrosis. He underwent left retrograde pyelography with JJ  Stent placement by Dr. Alyson Ingles on 9/2.  UCS/BC positive for E coli and antibiotics narrowed to ceftriaxone. He has effervesced but noted to have rise in WBC to 19.3. Repeat [facility] 9/4 negative so far and case discussed with Dr. Megan Salon who felt that current treatment appropriate. Antibiotics changed to bactrim today with recommendations for additional days antibiotic Tx.  Therapy ongoing and patient noted to be severely debilitated with inability to walk. CIR recommended for follow up therapy.      Review of Systems  Constitutional: Negative for chills and fever.  HENT: Negative for hearing loss.   Eyes: Negative for blurred vision and double vision.  Respiratory: Positive for cough and shortness of breath.   Cardiovascular: Negative for chest pain and palpitations.  Gastrointestinal: Negative for abdominal pain, diarrhea, heartburn, nausea and vomiting.  Genitourinary: Negative for dysuria.  Musculoskeletal: Negative for back pain, myalgias and neck pain.  Skin: Negative for rash.  Neurological: Positive for dizziness and focal weakness. Negative for headaches.  Endo/Heme/Allergies: Negative.   Psychiatric/Behavioral: Negative for depression and suicidal ideas.      Past Medical History:  Diagnosis Date  . Allergic rhinitis   . Arthritis    carpal tunnel, L hand gout, arthritis - spine   . 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 Cervical myelopathy ([place])   . Chronic low back pain   . Essential  hypertension   . GERD (gastroesophageal reflux disease)   . Hay fever   . Kidney calculi    Multiple passed in the past.   . Leukocytosis   . Neuromuscular disorder (H[facility])    spondylolisthesis - lumbar region  . Olecranon bursitis 07-24-2016   Dr. Percell Miller  . Physical deconditioning   . Prediabetes 2015   HbA1c 6.4%: pt saw nutritionist  . Slow transit constipation   . 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 Vitamin D deficiency     Past Surgical History:  Procedure Laterality Date  . ABDOMINAL EXPOSURE N/A 03/24/2015   Procedure: ABDOMINAL EXPOSURE;  Surgeon: [name], MD;  Location: [facility] NEURO ORS;  Service: Vascular;  Laterality: N/A;  left side approach  . ANTERIOR CERVICAL DECOMP/DISCECTOMY FUSION N/A 03/18/2016   Procedure: Cervical four-five Cervical five-six Cervical six-seven Anterior cervical decompression/diskectomy/fusion;  Surgeon: [name], MD;  Location: [place] NEURO ORS;  Service: Neurosurgery;  Laterality: N/A;  n  . ANTERIOR LAT LUMBAR FUSION Right 03/24/2015   Procedure: Right Lumbar two-Three,Lumbar Three-Four,Lumbar Four-Five Anterior lateral lumbar interbody fusion ;  Surgeon: [name], MD;  Location: [place] NEURO ORS;  Service: Neurosurgery;  Laterality: Right;  right  . COLONOSCOPY  approx 2011   Dr. [name] (normal per pt report)  . CYSTOSCOPY WITH STENT PLACEMENT Left 07/16/2016   Procedure: CYSTOSCOPY, RETROGRADE WITH LEFT URETERAL STENT PLACEMENT;  Surgeon: [name], MD;  Location: [facility] ORS;  Service: Urology;  Laterality: Left;  . HERNIA REPAIR  1993   Bilateral inguinal (urologist did these procedures)  . LITHOTRIPSY  Multiple; most recent was spring 2015  . POSTERIOR LUMBAR FUSION 4 LEVEL N/A 03/27/2015   Procedure: Posterior pedicle subtraction osteotomy with T10 to Iliac fusion with Dr. [name] assisting;  Surgeon: [name], MD;  Location: [facility]O [facility]S;  Service: Neurosurgery;  Laterality: N/A;  Posterior pedicle  subtraction osteotomy with T10 to Iliac fusion with Dr. [name] assisting  . TONSILLECTOMY  1954    Family History  Problem Relation Age of Onset  . Stroke Mother   . Diabetes Mother   . Heart disease Mother   . Hypertension Mother   . Heart attack Mother   . Stroke Father   . Heart disease Father   . Diabetes Brother   . Hypertension Brother     Social History:  Lives alone and was independent PTA. Able to ambulate short distances with rollator and B-AFOs.  Has been unable to drive since neck [id number]. He  reports that he quit smoking about 2 years ago. His smoking use included Cigarettes. He smoked 0.50 packs per day. He has quit using smokeless tobacco. His smokeless tobacco use included Chew. He reports that he drinks about 4.8 oz of alcohol per week . He reports that he does not use drugs.    Allergies: No Known Allergies    Medications Prior to Admission  Medication Sig Dispense Refill  . Cholecalciferol 5000 UNITS capsule Take 5,000 Units by mouth daily.    Marland Kitchen HYDROcodone-acetaminophen (NORCO/VICODIN) 5-325 MG tablet Take 1-2 tablets by mouth every 4 (four) hours as needed for moderate pain or severe pain (mild pain). 60 tablet 0  . Multiple Vitamins-Minerals (MULTIVITAMIN WITH MINERALS) tablet Take 1 tablet by mouth daily.    Marland Kitchen OVER THE COUNTER MEDICATION Take 1 Dose by mouth daily. Over the counter OPC3 and Isochrome powders 1 capful of both mixed in 4 ounces of water daily    . OVER THE COUNTER MEDICATION Take 1 tablet by mouth daily. BP Platinum    . OVER THE COUNTER MEDICATION Take 1 capsule by mouth daily. neuroquell    . OVER THE COUNTER MEDICATION Take 1 capsule by mouth 2 (two) times daily. Nerve renew    . PAPAYA PO Take 3 tablets by mouth daily as needed (heartburn).     . saccharomyces boulardii (FLORASTOR) 250 MG capsule Take 250 mg by mouth daily.     . Zinc Methionate 50 MG CAPS Take 50 mg by mouth daily.      Home: Home Living Family/patient  expects to be discharged to:: Inpatient rehab Living Arrangements: Alone Available Help at Discharge: Family, Available PRN/intermittently (brother and family can give close to 24/7 supervision for a ) Type of Home: House Home Access: Stairs to enter [employer] of Steps: 3 Entrance Stairs-Rails: Left Home Layout: Two level, Able to live on main level with bedroom/bathroom Alternate Level Stairs-Rails:  (does not have to go upstairs) Bathroom Shower/Tub: Multimedia programmer: Handicapped height Bathroom Accessibility: Yes Additional Comments: has 3:1 in shower and another over toilet; has high bed, some piece of sheeting on first two steps as ramp, so leaves one step up at top, uses rollator, wears AFOs in community but none in house, plans to d/c to rehab at this time  Lives With: Alone   Functional History: Prior Function Level of Independence: Independent with assistive device(s) Comments: states has worked hard in PT from [place] [facility] after both spinal surgeries, to HHPT with lots of HEP with stationary bike, leg and arm weights  over past year, able to ambulate shorter distances with rollator; no longer drives since cervical spine surgery  Functional Status:  Mobility: Bed Mobility Overal bed mobility: Needs Assistance Bed Mobility: Sit to Supine Supine to sit: Mod assist, HOB elevated Sit to supine: Min assist General bed mobility comments: pt required assist to bring R LE onto the bed; pt attempted to self assist but struggled bringing legs up onto the bed and lowering to supine in a controlled fashion; pt was uncoordinated in all movements Transfers Overall transfer level: Needs assistance Equipment used: None Transfer via Lift Equipment: Stedy Transfers: Sit to/from Stand Sit to Stand: +2 physical assistance, Max assist  Lateral/Scoot Transfers: Min assist General transfer comment: pt initially used Stedy in backwards position for support and to pull up  on to stand; knees were blocked, but pt did not buckle; pt then used Stedy in standard position for transfer to bed due to fatigue; pt was able to stand for approx. 1 min, however could not march in place, so [name] was used for transfer because of this Ambulation/Gait Ambulation/Gait assistance:  (not safe to ambulate at this time due to weakness)    ADL: ADL Overall ADL's : Needs assistance/impaired Eating/Feeding: Set up, Sitting Grooming: Set up, Sitting Upper Body Bathing: Set up, Minimal assitance, Sitting Lower Body Bathing: Maximal assistance, Cueing for safety, Cueing for sequencing, Cueing for compensatory techniques Lower Body Bathing Details (indicate cue type and reason): pt able to stand on steady while OT washed back and bottom. Pt able to stand on steady for appox 1 min prior to needing a break.  Pt performed 5 sit to stands on steady  with mod A to stand. Pt very dependent on arms for transition. Upper Body Dressing : Set up, Sitting Lower Body Dressing: Cueing for safety, Cueing for sequencing, Maximal assistance Toilet Transfer: +2 for physical assistance, Maximal assistance, Cueing for safety, Cueing for sequencing Toilet Transfer Details (indicate cue type and reason): sit to stand only Toileting- Clothing Manipulation and Hygiene: Maximal assistance Toileting - Clothing Manipulation Details (indicate cue type and reason): OT used steady to get pt from bed to chair General ADL Comments: used steady to get to chair  Cognition: Cognition Overall Cognitive Status: Within Functional Limits for tasks assessed Orientation Level: Oriented X4 Cognition Arousal/Alertness: Awake/alert Behavior During Therapy: WFL for tasks assessed/performed Overall Cognitive Status: Within Functional Limits for tasks assessed   Blood pressure (!) 143/70, pulse 71, temperature 98.7 F (37.1 C), temperature source Oral, resp. rate 18, height _0  (1.778 m), weight 72.5 kg (159 lb 12.8 oz),  SpO2 97 %. Physical Exam  Constitutional: He is oriented to person, place, and time. He appears well-developed and well-nourished.  HENT:  Head: Normocephalic.  Eyes: Pupils are equal, round, and reactive to light.  Neck: No tracheal deviation present.  Cardiovascular: Normal rate, regular rhythm and normal heart sounds.   Respiratory: Effort normal.  GI: Soft.  Genitourinary:  Genitourinary Comments: Foley   Musculoskeletal: He exhibits no edema or deformity.  Neurological: He is alert and oriented to person, place, and time. No cranial nerve deficit. Coordination normal.  Motor 4+/5 prox to distal in the UE's. LE: HF 2+, KE 3-, APF 4-, ADF trace. No sensory findings. DTR's 1+  Skin: Skin is warm and dry.  Psychiatric: He has a normal mood and affect. His behavior is normal. Judgment and thought content normal.    Results for orders placed or performed during the hospital encounter of 07/16/16 (from  the past 48 hour(s))  CBC     Status: Abnormal   Collection Time: 07/21/16  5:23 AM  Result Value Ref Range   WBC 19.3 (H) 4.0 - 10.5 K/uL   RBC 3.87 (L) 4.22 - 5.81 MIL/uL   Hemoglobin 11.7 (L) 13.0 - 17.0 g/dL   HCT 33.2 (L) 39.0 - 52.0 %   MCV 85.8 78.0 - 100.0 fL   MCH 30.2 26.0 - 34.0 pg   MCHC 35.2 30.0 - 36.0 g/dL   RDW 15.0 11.5 - 15.5 %   Platelets 180 150 - 400 K/uL  Basic metabolic panel     Status: Abnormal   Collection Time: 07/21/16  5:23 AM  Result Value Ref Range   Sodium 138 135 - 145 mmol/L   Potassium 3.7 3.5 - 5.1 mmol/L   Chloride 101 101 - 111 mmol/L   CO2 31 22 - 32 mmol/L   Glucose, Bld 122 (H) 65 - 99 mg/dL   BUN 15 6 - 20 mg/dL   Creatinine, Ser 0.59 (L) 0.61 - 1.24 mg/dL   Calcium 8.1 (L) 8.9 - 10.3 mg/dL   GFR calc non Af Amer >60 >60 mL/min   GFR calc Af Amer >60 >60 mL/min    Comment: (NOTE) The eGFR has been calculated using the CKD EPI equation. This calculation has not been validated in all clinical situations. eGFR's persistently <60  mL/min signify possible Chronic Kidney Disease.    Anion gap 6 5 - 15  Basic metabolic panel     Status: Abnormal   Collection Time: 07/22/16  5:26 AM  Result Value Ref Range   Sodium 137 135 - 145 mmol/L   Potassium 3.8 3.5 - 5.1 mmol/L   Chloride 100 (L) 101 - 111 mmol/L   CO2 29 22 - 32 mmol/L   Glucose, Bld 133 (H) 65 - 99 mg/dL   BUN 19 6 - 20 mg/dL   Creatinine, Ser 0.69 0.61 - 1.24 mg/dL   Calcium 8.0 (L) 8.9 - 10.3 mg/dL   GFR calc non Af Amer >60 >60 mL/min   GFR calc Af Amer >60 >60 mL/min    Comment: (NOTE) The eGFR has been calculated using the CKD EPI equation. This calculation has not been validated in all clinical situations. eGFR's persistently <60 mL/min signify possible Chronic Kidney Disease.    Anion gap 8 5 - 15  Magnesium     Status: None   Collection Time: 07/22/16  5:26 AM  Result Value Ref Range   Magnesium 1.9 1.7 - 2.4 mg/dL   No results found.     Medical Problem List and Plan: 1.  Mobility and functional deficits secondary to debility after multiple medical complications  -admit to inpatient rehab 2.  DVT Prophylaxis/Anticoagulation: Pharmaceutical: Lovenox 3. Pain Management: tylenol. Pain controlled at present.  4. Mood: team to provide ego support as necessary. Pt appears up beat 5. Neuropsych: This patient is capable of making decisions on his own behalf. 6. Skin/Wound Care: encourage nutrition.   -local skin care as necessary 7. Fluids/Electrolytes/Nutrition: check labs upon admission and as necessary  8. E coli bacteremia/UTI: Continue to monitor for temps/symptoms rather that WBC per ID. Has completed IV antibiotics X  7  Days. On Bactrim D # 1/10  9. Hypokalemia:resolved 10. Prediabetes: dietary education 11. Protein calorie malnutrition: 12. 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 The  patient doesn't  exactly recall either. He does have a history of cervical stenosis, myelopathy, and subsequent surgery as wel.  -asked him to bring in his AFO's at home (used them sporadically)  -apparently used a steppage gait pattern around the house/community  -might be able to make some adjustments or replace his current AFO's     Post Admission Physician Evaluation: 1. Functional deficits secondary  to debility after multiple medical. 2. Patient is admitted to receive collaborative, interdisciplinary care between the physiatrist, rehab nursing staff, and therapy team. 3. Patient's level of medical complexity and substantial therapy needs in context of that medical necessity cannot be provided at a lesser intensity of care such as a SNF. 4. Patient has experienced substantial functional loss from his/her baseline which was documented above under the "Functional History" and "Functional Status" headings.  Judging by the patient's diagnosis, physical exam, and functional history, the patient has potential for functional progress which will result in measurable gains while on inpatient rehab.  These gains will be of substantial and practical use upon discharge  in facilitating mobility and self-care at the household level. 5. Physiatrist will provide 24 hour management of medical needs as well as oversight of the therapy plan/treatment and provide guidance as appropriate regarding the interaction of the two. 6. 24 hour rehab nursing will assist with bladder management, bowel management, safety, skin/wound care, disease management, medication administration, pain management and patient education  and help integrate therapy concepts, techniques,education, etc. 7. PT will assess and treat for/with: Lower extremity strength, range of motion, stamina, balance, functional mobility, safety, adaptive techniques and equipment, NMR, stamina, orthotic assessment/adaptation, community reintegration.   Goals are: supervision to  min assist. 8. OT will assess and treat for/with: ADL's, functional mobility, safety, upper extremity strength, adaptive techniques and equipment, NMR, community reintegration, ego support, .   Goals are: supervision to min assist. Therapy may proceed with showering this patient. 9. SLP will assess and treat for/with: n/a.  Goals are: n/a. 10. Case Management and Social Worker will assess and treat for psychological issues and discharge planning. 11. Team conference will be held weekly to assess progress toward goals and to determine barriers to discharge. 12. Patient will receive at least 3 hours of therapy per day at least 5 days per week. 13. ELOS: 13-18 days       14. Prognosis:  excellent     Meredith Staggers, MD, [place] [facility] 07/22/2016  07/22/2016

## 2016-07-22 NOTE — Progress Notes (Signed)
Patient to transfer to [place] today. Regina at [facility] made aware.   No further CSW needs identified - CSW signing off.   Raynaldo Opitz, [facility] Clinical Social Worker cell #: [phone]

## 2016-07-22 NOTE — Progress Notes (Signed)
Meredith Staggers, MD Physician Signed Physical Medicine and Rehabilitation  PMR Pre-admission Date of Service: 07/21/2016 3:45 PM  Related encounter: ED to Hosp-Admission (Discharged) from 07/16/2016 in [place]       []Hide copied text   [employer] PMR Admission Coordinator Pre-Admission Assessment  Patient: Ethan Rose is an 70 y.o., male MRN: [id number] DOB: 1945/12/09 Height: 5' 10" (177.8 cm) Weight: 72.5 kg (159 lb 12.8 oz)  Insurance Information HMO:     PPO:      PCP:      IPA:      80/20: yes     OTHER: no HMO PRIMARY: Medicare a and b      Policy#: [id number]      Subscriber: pt Benefits:  Phone #: passport oneo online     Name: 07/21/16 Eff. Date: 03/15/11     Deduct: $1316      Out of Pocket Max: none      Life Max: none CIR: 100%      SNF: last SNF 5/17 Outpatient: 80%     Co-Pay: 20% Home Health: 100%      Co-Pay: none DME: 80%     Co-Pay: 20% Providers: pt choice  SECONDARY: [employer] [place] supplement      Policy#: [id number]      Subscriber: pt  Medicaid Application Date:       Case Manager:  Disability Application Date:       Case Worker:   Emergency Contact Information        Contact Information    Name Relation Home Work [place] Daughter   [phone]   [facility] Other   [phone]      Current Medical History  Patient Admitting Diagnosis: Debility  History of Present Illness: Ethan Denne Simpsonis a 70 y.o.gentleman with a history of nephrolithiasis s/p lithotripsy x 4 in the past, prior cervical and lumbar spine surgeries, and GERD who presented to the ED in [facility] for evaluation of two days of progressive left flank pain associated with fever to 102 (documented at home) and generalized weakness. Symptoms started Thursday morning. Pain and fever were refractory to advil and hydrocodone. He attempted to get an appointment with his primary urologist on Friday, but he  was unsuccessful. He denies dysuria or hematuria. No nausea or vomiting. No syncope.  CT scan stone protocol showed an 75m mid left ureteral calculus with moderate left sided hydroureteronephrosis. Urology was consulted. Code Sepsis was activated. U/A showed infection (nitrite positive, large leukocytes, TNTC WBC, many bacteria) with mild AKI (BUN 41, Cr 1.29), leukocytosis of 19.7 and lactic acid level of 4.81. Blood and urine cultures were sent. The patient received IV Rocephin. He received 2.25L of NS based on sepsis protocol. [facility] ureteral stent placement.  Post-operatively, [facility]. Transferred to telemetry 07/18/2016.   Initially on Vanc and Zosyn, transitioned to Rocephin based on blood cultures results which grew E. Coli sensitive to Rocephin. Urine culture also grew E Coli. Leukocytosis improved from admission but with elevation on 9/7 to 19 K. MD spoke with Dr. JMichel Bickersof ID and he recommended to cease obtaining daily CBCs for pt clinically improving.   [place] [facility] [place] [facility] [place] [facility] [place] [facility] admission coordinator and physician. NIH  Stroke scale: Glascow Coma Scale:  Past Medical History      Past Medical History:  Diagnosis Date  . Allergic rhinitis   . Arthritis    carpal tunnel, L hand gout, arthritis - spine   . 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 Cervical myelopathy ([place])   . Chronic low back pain   . Essential hypertension   . GERD (gastroesophageal reflux disease)   . Hay fever   . Kidney calculi    Multiple passed in the past.   . Leukocytosis   . Neuromuscular disorder (H[facility])    spondylolisthesis - lumbar region  . Olecranon bursitis Sep 30, 202017   Dr. Percell Miller  . Physical deconditioning   .  Prediabetes 2015   HbA1c 6.4%: pt saw nutritionist  . Slow transit constipation   . 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 Vitamin D deficiency     Family History   family history includes Diabetes in his brother and mother; Heart attack in his mother; Heart disease in his father and mother; Hypertension in his brother and mother; Stroke in his father and mother.  Prior Rehab/Hospitalizations Has the patient had major surgery during 100 days prior to admission? Yes                      Cervical surgery 03/2016 and received SNF rehab at [facility] for about 20 days  Current Medications Per [facility]   Patients Current Diet:  Regular Carb Modified with thin liquids  Precautions / Restrictions Precautions Precautions: Fall Restrictions Weight Bearing Restrictions: Yes   Has the patient had 2 or more falls or a fall with injury in the past year?No  Prior Activity Level Community (5-7x/wk): not driving since last year Wears AFO in community. Does not wear in home for he wears nonskid socks in home, not shoes. Uses Rollator and ambulates about 100 feet before he tires. Does own laundry and adls. Uses Rollator to get into shower and then sits in shower seat. Neighbor couple provides meals daily.  Prior Functional Level Self Care: Did the patient need help bathing, dressing, using the toilet or eating?  Independent  Indoor Mobility: Did the patient need assistance with walking from room to room (with or without device)? Independent  Stairs: Did the patient need assistance with internal or external stairs (with or without device)? Independent  Functional Cognition: Did the patient need help planning regular tasks such as shopping or remembering to take medications? Independent  Home Assistive Devices / Equipment Home Assistive Devices/Equipment: CPAP, Walker (specify type)  Prior Device Use: Indicate devices/aids used by the patient prior to current  illness, exacerbation or injury? Walker. Pt states not using AFOs in the home.   Prior Functional Level Current Functional Level  Bed Mobility Independent Min assist  Transfers Mod Independent Mod assist (with stedy) of 2 people  Mobility - Walk/Wheelchair Mod Independent  (no ambulation yet)  Upper Body Dressing Independent Min assist  Lower Body Dressing Independent Total assist  Grooming Independent Independent  Eating/Drinking Independent Independent  Toilet Transfer Independent Mod assist  Bladder Continence  continent indwelling catheter  Bowel Management continent LBM 9/7 continent  Stair Climbing Independent  (not attempted)  Communication independent independent  Memory intact intact; very talkative  Cooking/Meal Prep neighbor couple provides meals daily     Housework pt does own laundry and finances   Money Management independent   Driving Has not driven since last  year     Special needs/care consideration BiPAP/CPAP yes CPM Continuous Drip IV Dialysis Life Vest Oxygen Special Bed Trach Size Wound Vac (area) Skin intact Bowel mgmt:LBM 9/7 continent Bladder mgmt:indwelling catheter Diabetic mgmt Hgb A1c 6.2  Previous Home Environment Living Arrangements: Alone  Lives With: Alone Available Help at Discharge: Family, Available PRN/intermittently (brother and family can give close to 24/7 supervision for a ) Type of Home: House Home Layout: Two level, Able to live on main level with bedroom/bathroom Alternate Level Stairs-Rails:  (does not have to go upstairs) Home Access: Stairs to enter Entrance Stairs-Rails: Left Entrance Stairs-Number of Steps: 3 Bathroom Shower/Tub: Multimedia programmer: Handicapped height Bathroom Accessibility: Yes How Accessible: Accessible via walker [place]: Yes Type of [place] (if known): [facility] Additional Comments: has 3:1 in shower and another over  toilet; has high bed, some piece of sheeting on first two steps as ramp, so leaves one step up at top, uses rollator, wears AFOs in community but none in house, plans to d/c to rehab at this time  Discharge Living Setting Plans for Discharge Living Setting: Patient's home, Alone, House Type of Home at Discharge: House Discharge Home Layout: Two level, Able to live on main level with bedroom/bathroom Alternate Level Stairs-Rails:  (does not have to go upstairs) Discharge Home Access: Stairs to enter Entrance Stairs-Rails: Left Entrance Stairs-Number of Steps: 3 Discharge Bathroom Shower/Tub: Walk-in shower Discharge Bathroom Toilet: Handicapped height Discharge Bathroom Accessibility: Yes How Accessible: Accessible via walker Does the patient have any problems obtaining your medications?: No  Social/Family/Support Systems Patient Roles: Parent (wife died 45) Contact Information: Marc Morgans, daughter Anticipated Caregiver: daughter, brother and other family members Anticipated Caregiver's Contact Information: see above Ability/Limitations of Caregiver: daughter works, brother can assist short term Caregiver Availability:  (close to 24/7 short term) Discharge Plan Discussed with Primary Caregiver: Yes Is Caregiver In Agreement with Plan?: Yes Does Caregiver/Family have Issues with Lodging/Transportation while Pt is in Rehab?: No  Goals/Additional Needs Patient/Family Goal for Rehab: Mod I to supervision with PT and OT Expected length of stay: ELOS 10-14 days Pt/Family Agrees to Admission and willing to participate: Yes Program Orientation Provided & Reviewed with Pt/Caregiver Including Roles  & Responsibilities: Yes  Patient Condition: I have met with patient at bedside and reviewed his medical records. Patient has sepsis and now is debilitated. Patient is mod to max assist with PT and OT with STEDY equipment Unable to ambulate at present He will benefit from the coordinated  team of Medical, Rehab Nursing and PT and OT with an inpatient acute rehabilitation admission. He will benefit form three hours per day of therapy. I have discussed and reviewed case with the Rehab MD and have approval for admission today.  Preadmission Screen Completed By:  Cleatrice Burke, 07/22/2016 10:20 AM ______________________________________________________________________   Discussed status with Dr. Naaman Plummer on 07/22/2016 at 65 and received telephone approval for admission today.  Admission Coordinator:  Cleatrice Burke, time  [id number] Date  07/22/2016   Assessment/Plan: Diagnosis: debility related to sepsis and multiple medical 1. Does the need for close, 24 hr/day  Medical supervision in concert with the patient's rehab needs make it unreasonable for this patient to be served in a less intensive setting? Yes 2. Co-Morbidities requiring supervision/potential complications: cervical myelopathy, htn, GERD,  3. Due to bladder management, bowel management, safety, skin/wound care, disease management, medication administration, pain management and patient education, does the  patient require 24 hr/day rehab nursing? Yes 4. Does the patient require coordinated care of a physician, rehab nurse, PT (1-2 hrs/day, 5 days/week) and OT (1-2 hrs/day, 5 days/week) to address physical and functional deficits in the context of the above medical diagnosis(es)? Yes Addressing deficits in the following areas: balance, endurance, locomotion, strength, transferring, bowel/bladder control, bathing, dressing, feeding, grooming, toileting and psychosocial support 5. Can the patient actively participate in an intensive therapy program of at least 3 hrs of therapy 5 days a week? Yes 6. The potential for patient to make measurable gains while on inpatient rehab is excellent 7. Anticipated functional outcomes upon discharge from inpatients are: modified independent PT, modified independent OT, n/a  SLP 8. Estimated rehab length of stay to reach the above functional goals is: 10-14 days 9. Does the patient have adequate social supports to accommodate these discharge functional goals? Yes 10. Anticipated D/C setting: Home 11. Anticipated post D/C treatments: HH therapy and Outpatient therapy 12. Overall Rehab/Functional Prognosis: excellent    RECOMMENDATIONS: This patient's condition is appropriate for continued rehabilitative care in the following setting: CIR Patient has agreed to participate in recommended program. Yes Note that insurance prior authorization may be required for reimbursement for recommended care.  Comment: admitting to inpatient rehab today  Meredith Staggers, MD, [facility] 07/22/2016   Cleatrice Burke 07/22/2016    Revision History

## 2016-07-22 NOTE — Progress Notes (Signed)
I spoke with pt by phone and offered an inpt rehab bed admission today and hie is in agreement. RN CM and SW alerted. I spoke with Dr. Ree Kida and we will make the arrangements to admit pt today. NW:[id number]

## 2016-07-23 ENCOUNTER — Inpatient Hospital Stay (HOSPITAL_COMMUNITY): Payer: Medicare Other | Admitting: Physical Therapy

## 2016-07-23 ENCOUNTER — Inpatient Hospital Stay (HOSPITAL_COMMUNITY): Payer: Medicare Other

## 2016-07-23 LAB — CBC WITH DIFFERENTIAL/PLATELET
BASOS ABS: 0 10*3/uL (ref 0.0–0.1)
Basophils Relative: 0 %
Eosinophils Absolute: 0.2 10*3/uL (ref 0.0–0.7)
Eosinophils Relative: 1 %
HCT: 35 % — ABNORMAL LOW (ref 39.0–52.0)
Hemoglobin: 11.4 g/dL — ABNORMAL LOW (ref 13.0–17.0)
LYMPHS ABS: 1.7 10*3/uL (ref 0.7–4.0)
Lymphocytes Relative: 10 %
MCH: 28.9 pg (ref 26.0–34.0)
MCHC: 32.6 g/dL (ref 30.0–36.0)
MCV: 88.8 fL (ref 78.0–100.0)
MONO ABS: 1 10*3/uL (ref 0.1–1.0)
Monocytes Relative: 6 %
NEUTROS ABS: 14.3 10*3/uL — AB (ref 1.7–7.7)
Neutrophils Relative %: 83 %
PLATELETS: 330 10*3/uL (ref 150–400)
RBC: 3.94 MIL/uL — AB (ref 4.22–5.81)
RDW: 15.1 % (ref 11.5–15.5)
Smear Review: ADEQUATE
WBC: 17.2 10*3/uL — AB (ref 4.0–10.5)

## 2016-07-23 LAB — COMPREHENSIVE METABOLIC PANEL
ALK PHOS: 94 U/L (ref 38–126)
ALT: 30 U/L (ref 17–63)
ANION GAP: 9 (ref 5–15)
AST: 24 U/L (ref 15–41)
Albumin: 2.3 g/dL — ABNORMAL LOW (ref 3.5–5.0)
BUN: 16 mg/dL (ref 6–20)
CALCIUM: 8.4 mg/dL — AB (ref 8.9–10.3)
CHLORIDE: 99 mmol/L — AB (ref 101–111)
CO2: 27 mmol/L (ref 22–32)
CREATININE: 0.68 mg/dL (ref 0.61–1.24)
Glucose, Bld: 121 mg/dL — ABNORMAL HIGH (ref 65–99)
Potassium: 4.2 mmol/L (ref 3.5–5.1)
SODIUM: 135 mmol/L (ref 135–145)
Total Bilirubin: 0.6 mg/dL (ref 0.3–1.2)
Total Protein: 5.8 g/dL — ABNORMAL LOW (ref 6.5–8.1)

## 2016-07-23 LAB — CULTURE, BLOOD (ROUTINE X 2)
CULTURE: NO GROWTH
CULTURE: NO GROWTH

## 2016-07-23 NOTE — Evaluation (Signed)
Occupational Therapy Assessment and Plan  Patient Details  Name: Ethan Rose MRN: [id number] Date of Birth: 1946/03/19  OT Diagnosis: muscle weakness (generalized) Rehab Potential: Rehab Potential (ACUTE ONLY): Good ELOS: 12-14 days   Today's Date: 07/23/2016 OT Individual Time: 0800-0900 OT Individual Time Calculation (min): 60 min      Problem List:  Patient Active Problem List   Diagnosis Date Noted  . Debility 07/22/2016  . Pressure ulcer 07/18/2016  . E coli bacteremia   . Leukocytosis   . Sepsis ([place]) 07/16/2016  . Severe sepsis with septic shock ([place]) 07/16/2016  . Pyelonephritis 07/16/2016  . Left ureteral stone 07/16/2016  . Septic shock ([place]) 07/16/2016  . Cervical myelopathy ([place]) 03/18/2016  . 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 Other malaise and fatigue 05/14/2014  . Health maintenance examination 04/29/2013  . Weakness of both legs 04/29/2013  . Leg cramps 04/29/2013  . Elevated blood pressure reading without diagnosis of hypertension 03/30/2012  . Dizziness 03/30/2012    Past Medical History:  Past Medical History:  Diagnosis Date  . Allergic rhinitis   . Arthritis    carpal tunnel, L hand gout, arthritis - spine   . 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 Cervical myelopathy ([place])   . Chronic low back pain   . Essential hypertension   . GERD (gastroesophageal reflux disease)   . Hay fever   . Kidney calculi    Multiple passed in the past.   . Leukocytosis   . Neuromuscular disorder ([facility])    spondylolisthesis - lumbar region  . Olecranon bursitis 18-Sep-202017   Dr. Percell Miller  . Physical deconditioning   . Prediabetes 2015   HbA1c 6.4%: pt saw nutritionist  . Slow transit constipation   . 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 Vitamin D deficiency    Past Surgical History:  Past Surgical History:   Procedure Laterality Date  . ABDOMINAL EXPOSURE N/A 03/24/2015   Procedure: ABDOMINAL EXPOSURE;  Surgeon: [name], MD;  Location: [facility] NEURO ORS;  Service: Vascular;  Laterality: N/A;  left side approach  . ANTERIOR CERVICAL DECOMP/DISCECTOMY FUSION N/A 03/18/2016   Procedure: Cervical four-five Cervical five-six Cervical six-seven Anterior cervical decompression/diskectomy/fusion;  Surgeon: [name], MD;  Location: [facility] NEURO ORS;  Service: Neurosurgery;  Laterality: N/A;  n  . ANTERIOR LAT LUMBAR FUSION Right 03/24/2015   Procedure: Right Lumbar two-Three,Lumbar Three-Four,Lumbar Four-Five Anterior lateral lumbar interbody fusion ;  Surgeon: [name], MD;  Location: [facility] NEURO ORS;  Service: Neurosurgery;  Laterality: Right;  right  . COLONOSCOPY  approx 2011   Dr. [name] (normal per pt report)  . CYSTOSCOPY WITH STENT PLACEMENT Left 07/16/2016   Procedure: CYSTOSCOPY, RETROGRADE WITH LEFT URETERAL STENT PLACEMENT;  Surgeon: [name], MD;  Location: [facility] ORS;  Service: Urology;  Laterality: Left;  . HERNIA REPAIR  1993   Bilateral inguinal (urologist did these procedures)  . LITHOTRIPSY     Multiple; most recent was spring 2015  . POSTERIOR LUMBAR FUSION 4 LEVEL N/A 03/27/2015   Procedure: Posterior pedicle subtraction osteotomy with T10 to Iliac fusion with Dr. [name] assisting;  Surgeon: [name], MD;  Location: [facility] NEURO ORS;  Service: Neurosurgery;  Laterality: N/A;  Posterior pedicle subtraction osteotomy with T10 to Iliac fusion with Dr. [name] assisting  . [place]  Clinical Impression: Patient is a 70 y.o. year old male with history of  prediabetes, multiple back surgeries with chronic back pain and bilateral foot drop,  cervical myelopathy, nephrolithiasis who was admitted on [facility] on 07/16/16 with pain, fever and generalized malaise due to  sepsis from left ureteral stone with hydronephrosis. He underwent left retrograde  pyelography with JJ  Stent placement by Dr. Alyson Ingles on 9/2.  UCS/BC positive for E coli and antibiotics narrowed to ceftriaxone. He has effervesced but noted to have rise in WBC to 19.3. Repeat [facility] 9/4 negative so far and case discussed with Dr. Megan Salon who felt that current treatment appropriate. Antibiotics changed to bactrim today with recommendations for additional days antibiotic Tx.  Therapy ongoing and patient noted to be severely debilitated with inability to walk. CIR recommended for follow up therapy.   Patient transferred to CIR on 07/22/2016.    Patient currently requires moderate assistance with basic self-care skills secondary to muscle weaknessPrior to hospitalization, patient could complete BADL independently.   Patient will benefit from skilled intervention to increase independence with basic self-care skills and increase level of independence with iADL prior to discharge home independently.  Anticipate patient will require intermittent supervision and follow up home health.  OT - End of Session Activity Tolerance: Tolerates 30+ min activity with multiple rests Endurance Deficit: Yes OT Assessment Rehab Potential (ACUTE ONLY): Good OT Patient demonstrates impairments in the following area(s): Balance;Endurance OT Basic ADL's Functional Problem(s): Bathing;Dressing;Toileting OT Advanced ADL's Functional Problem(s): Simple Meal Preparation;Laundry OT Transfers Functional Problem(s): Toilet;Tub/Shower OT Plan OT Intensity: Minimum of 1-2 x/day, 45 to 90 minutes OT Frequency: 5 out of 7 days OT Duration/Estimated Length of Stay: 10-14 days OT Treatment/Interventions: Discharge planning;DME/adaptive equipment instruction;Patient/family education;Functional mobility training;Therapeutic Activities;Therapeutic Exercise;UE/LE Strength taining/ROM;Self Care/advanced ADL retraining;Balance/vestibular training OT Self Feeding Anticipated Outcome(s): Independent OT Basic Self-Care  Anticipated Outcome(s): Mod I OT Toileting Anticipated Outcome(s): Mod I OT Bathroom Transfers Anticipated Outcome(s): Supervision OT Recommendation Patient destination: Home Follow Up Recommendations: Home health OT Equipment Recommended: To be determined  Skilled Therapeutic Intervention OT initial evaluation completed with treatment provided to focus on pt/family ed on methods and goals of treatment, safe and effective use of DME, transfers, and adapted dressing skills.   Pt completed bed mobility, transfer (bed>w/c, w/c<>toilet) with overall mod assist and instructional cues for technique.   Pt able to dress sitting and standing supported with setup of upper body and max assist to don pants and socks.   Pt demo'd impaired endurance requiring frequent rest breaks and manual facilitation for placement of lower legs.  OT Evaluation Precautions/Restrictions  Precautions Precautions: Fall  General Chart Reviewed: Yes Family/Caregiver Present: Yes Carmine Savoy, grandson)  Vital Signs Therapy Vitals Temp: 99.2 F (37.3 C) Temp Source: Oral Pulse Rate: 78 Resp: 18 BP: 123/63 Patient Position (if appropriate): Lying Oxygen Therapy SpO2: 96 % O2 Device: Not Delivered   Pain Pain Assessment Pain Assessment: No/denies pain Pain Score: 0-No pain  Home Living/Prior Functioning Home Living Available Help at Discharge: Family, Available PRN/intermittently (dtr lives 2.5 miles away, brother 3 miles) Type of Home: House Home Access: Stairs to enter Technical brewer of Steps: 3, with make-shift ramp (garage entry) Entrance Stairs-Rails: Left Home Layout: Two level, Able to live on main level with bedroom/bathroom ([facility] 2012) Alternate Level Stairs-Number of Steps: n/a Bathroom Shower/Tub: Multimedia programmer: Handicapped height Bathroom Accessibility: Yes Additional Comments: has 3:1 in shower and another over toilet; has high bed, some piece of sheeting on  first  two steps as ramp, so leaves one step up at top, uses rollator, wears AFOs in community but none in house, plans to d/c to rehab at this time  Lives With: Alone IADL History Homemaking Responsibilities: Yes Meal Prep Responsibility: Secondary Laundry Responsibility: Primary Cleaning Responsibility: No (paid help) Bill Paying/Finance Responsibility: Primary Shopping Responsibility: Primary Child Care Responsibility: No Current License: Yes Mode of Transportation: Car Education: HS+ 1.5 college Occupation: Retired Type of Occupation: [place] and Hobbies: nothing for the last year d/t neck surgery Prior Function Level of Independence: Independent with basic ADLs, Requires assistive device for independence, Needs assistance with homemaking Light Housekeeping: Other (comment) (paid help)  Able to Take Stairs?: Yes Driving: Yes (4 weeks prior to admission) Comments: states has worked hard in PT from [place] place after both spinal surgeries, to HHPT with lots of HEP with stationary bike, leg and arm weights over past year, able to ambulate shorter distances with rollator; no longer drives since cervical spine surgery  ADL ADL ADL Comments: see Functional Assessment Tool  Vision/Perception  Vision- History Baseline Vision/History: Wears glasses Wears Glasses: Reading only Patient Visual Report: No change from baseline Vision- Assessment Vision Assessment?: No apparent visual deficits   Cognition Overall Cognitive Status: Within Functional Limits for tasks assessed Arousal/Alertness: Awake/alert Orientation Level: Person;Place;Situation Person: Oriented Place: Oriented Situation: Oriented Year: 2017 Month: September Day of Week: Correct Memory: Appears intact Immediate Memory Recall: Sock;Blue;Bed Memory Recall: Sock;Blue;Bed Memory Recall Sock: Without Cue Memory Recall Blue: Without Cue Memory Recall Bed: Without Cue Attention: Alternating Awareness:  Appears intact Problem Solving: Appears intact Safety/Judgment: Appears intact  Sensation Sensation Light Touch: Appears Intact Stereognosis: Appears Intact Hot/Cold: Appears Intact Proprioception: Appears Intact Coordination Gross Motor Movements are Fluid and Coordinated: Yes Fine Motor Movements are Fluid and Coordinated: Yes  Motor  Motor Motor: Other (comment) Motor - Skilled Clinical Observations: Generalized weakness and debility   Mobility  Bed Mobility Bed Mobility: Not assessed   Trunk/Postural Assessment  Cervical Assessment Cervical Assessment: Within Functional Limits Thoracic Assessment Thoracic Assessment: Within Functional Limits Lumbar Assessment Lumbar Assessment: Within Functional Limits Postural Control Postural Control: Deficits on evaluation   Balance Balance Balance Assessed: Yes Static Sitting Balance Static Sitting - Level of Assistance: 5: Stand by assistance Dynamic Sitting Balance Dynamic Sitting - Level of Assistance: 4: Min assist Static Standing Balance Static Standing - Level of Assistance: 3: Mod assist Dynamic Standing Balance Dynamic Standing - Level of Assistance: 3: Mod assist   Extremity/Trunk Assessment RUE Assessment RUE Assessment: Exceptions to [facility] RUE Strength RUE Overall Strength: Deficits (WFL except triceps (4/5)) LUE Assessment LUE Assessment: Exceptions to [facility] LUE Strength LUE Overall Strength: Deficits (WFL except triceps (4/5))   See Function Navigator for Current Functional Status.   Refer to Care Plan for Long Term Goals  Recommendations for other services: None  Discharge Criteria: Patient will be discharged from OT if patient refuses treatment 3 consecutive times without medical reason, if treatment goals not met, if there is a change in medical status, if patient makes no progress towards goals or if patient is discharged from hospital.  The above assessment, treatment plan, treatment alternatives  and goals were discussed and mutually agreed upon: by patient   Second session: Time:  1030-1100 Time Calculation (min):  30 min  Pain Assessment: Therapeutic exercise with focus on general endurance and upper body strengthening.  Pt escorted to gym and setup at [employer] for arm ergometry, X 10 min, level 1, random  routine, followed by 15 min of therex using theraband to perform: chest press, seated row, scapular chest pull, scapular pull down, bicep curl, tricep push down, tricep horizontal press.   Pt demo'd mild fatigue from [employer], resting briefly prior to UE therex.  Pt required hand guidance for technique of therex due to confusion with hand placement.      Skilled Therapeutic Interventions:   See FIM for current functional status  Therapy/Group: Individual Therapy  Third session: Time:  1415-1500 Time Calculation (min):  45 min  Pain Assessment: No/denies pain    Skilled Therapeutic Interventions: ADL-retraining at shower level with focus on improved activity tolerance, transfers, AE use (LH sponge and sock aid).   Pt received seated in w/c and awaiting therapist for planned assisted bathing.   With setup to place w/c, lace catheter bag out of pants, and mod assist to lift, pt completed lateral transfer to tub bench and undressed sitting on bench, using lateral leans to remove pants.   With extra time, patient completed bathing and returned to w/c to groom at sink.  As pt had previously dressed during eval, pt requested return to bed, wearing gown for comfort.   Pt performed lateral transfer from w/c to bed, with mod assist to lift legs into bed d/t fatigue.      See FIM for current functional status  Therapy/Group: Individual Therapy  [place] 07/23/2016, 4:23 PM

## 2016-07-23 NOTE — Evaluation (Signed)
Physical Therapy Assessment and Plan  Patient Details  Name: Ethan Rose MRN: [id number] Date of Birth: [birth year]  PT Diagnosis: Abnormality of gait, Difficulty walking, Muscle weakness and Quadriplegia/paresis (from previous lumbar and cervical surgeries) Rehab Potential: Good ELOS: 14-16 days   Today's Date: 07/23/2016 PT Individual Time: 1104-1205 PT Individual Time Calculation (min): 61 min     Problem List:  Patient Active Problem List   Diagnosis Date Noted  . Debility 07/22/2016  . Pressure ulcer 07/18/2016  . E coli bacteremia   . Leukocytosis   . Sepsis ([place]) 07/16/2016  . Severe sepsis with septic shock ([place]) 07/16/2016  . Pyelonephritis 07/16/2016  . Left ureteral stone 07/16/2016  . Septic shock ([place]) 07/16/2016  . Cervical myelopathy ([place]) 03/18/2016  . 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 Other malaise and fatigue 05/14/2014  . Health maintenance examination 04/29/2013  . Weakness of both legs 04/29/2013  . Leg cramps 04/29/2013  . Elevated blood pressure reading without diagnosis of hypertension 03/30/2012  . Dizziness 03/30/2012    Past Medical History:  Past Medical History:  Diagnosis Date  . Allergic rhinitis   . Arthritis    carpal tunnel, L hand gout, arthritis - spine   . 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 Cervical myelopathy ([place])   . Chronic low back pain   . Essential hypertension   . GERD (gastroesophageal reflux disease)   . Hay fever   . Kidney calculi    Multiple passed in the past.   . Leukocytosis   . Neuromuscular disorder ([facility])    spondylolisthesis - lumbar region  . Olecranon bursitis 01-08-2016   Dr. Percell Miller  . Physical deconditioning   . Prediabetes 2015   HbA1c 6.4%: pt saw nutritionist  . Slow transit constipation   . 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 Vitamin D deficiency     Past Surgical History:  Past Surgical History:  Procedure Laterality Date  . ABDOMINAL EXPOSURE N/A 03/24/2015   Procedure: ABDOMINAL EXPOSURE;  Surgeon: [name], MD;  Location: [facility] NEURO ORS;  Service: Vascular;  Laterality: N/A;  left side approach  . ANTERIOR CERVICAL DECOMP/DISCECTOMY FUSION N/A 03/18/2016   Procedure: Cervical four-five Cervical five-six Cervical six-seven Anterior cervical decompression/diskectomy/fusion;  Surgeon: [name], MD;  Location: [facility] NEURO ORS;  Service: Neurosurgery;  Laterality: N/A;  n  . ANTERIOR LAT LUMBAR FUSION Right 03/24/2015   Procedure: Right Lumbar two-Three,Lumbar Three-Four,Lumbar Four-Five Anterior lateral lumbar interbody fusion ;  Surgeon: [name], MD;  Location: [place] NEURO ORS;  Service: Neurosurgery;  Laterality: Right;  right  . COLONOSCOPY  approx 2011   Dr. [name] (normal per pt report)  . CYSTOSCOPY WITH STENT PLACEMENT Left 07/16/2016   Procedure: CYSTOSCOPY, RETROGRADE WITH LEFT URETERAL STENT PLACEMENT;  Surgeon: [name], MD;  Location: [facility] ORS;  Service: Urology;  Laterality: Left;  . HERNIA REPAIR  1993   Bilateral inguinal (urologist did these procedures)  . LITHOTRIPSY     Multiple; most recent was spring 2015  . POSTERIOR LUMBAR FUSION 4 LEVEL N/A 03/27/2015   Procedure: Posterior pedicle subtraction osteotomy with T10 to Iliac fusion with Dr. [name] assisting;  Surgeon: [name], MD;  Location: [facility];  Service: Neurosurgery;  Laterality: N/A;  Posterior pedicle subtraction osteotomy with T10 to Iliac fusion with Dr. [name] assisting  . TONSILLECTOMY  1954    Assessment & Plan Clinical Impression: Patient is a 70 y.o.gentleman with a history of nephrolithiasis s/p lithotripsy x 4 in the past, prior cervical and lumbar spine surgeries, and GERD who presented to the ED in [facility] for evaluation of two days of progressive left flank pain associated with fever to 102 (documented at  home) and generalized weakness. Symptoms started Thursday morning. Pain and fever were refractory to advil and hydrocodone. He attempted to get an appointment with his primary urologist on Friday, but he was unsuccessful. He denies dysuria or hematuria. No nausea or vomiting. No syncope.  CT scan stone protocol showed an 74m mid left ureteral calculus with moderate left sided hydroureteronephrosis. Urology was consulted. Code Sepsis was activated. U/A showed infection (nitrite positive, large leukocytes, TNTC WBC, many bacteria) with mild AKI (BUN 41, Cr 1.29), leukocytosis of 19.7 and lactic acid level of 4.81. Blood and urine cultures were sent. The patient received IV Rocephin. He received 2.25L of NS based on sepsis protocol. [facility]rology for left ureteral stent placement.  Post-operatively, [facility]. Transferred to telemetry 07/18/2016.   Initially on Vanc and Zosyn, transitioned to Rocephin based on blood cultures results which grew E. Coli sensitive to Rocephin. Urine culture also grew E Coli. Leukocytosis improved from admission but with elevation on 9/7 to 19 K. MD spoke with Dr. JMichel Bickersof ID and he recommended to cease obtaining daily CBCs for pt clinically improving.  Patient transferred to CIR on 07/22/2016 .   Patient currently requires max with mobility secondary to muscle weakness, decreased cardiorespiratoy endurance, impaired timing and sequencing and decreased sitting balance, decreased standing balance, decreased postural control and decreased balance strategies.  Prior to hospitalization, patient was modified independent  with mobility and lived with Alone in a House home.  Home access is 3, with make-shift ramp (garage entry)Ramped entrance.  Patient will benefit from skilled PT intervention to maximize safe functional mobility,  minimize fall risk and decrease caregiver burden for planned discharge home with intermittent assist.  [facility]  PT - End of Session Activity Tolerance: Tolerates 10 - 20 min activity with multiple rests Endurance Deficit: Yes Endurance Deficit Description: deconditioned from sepsis; recovering from neck surgery PT Assessment Rehab Potential (ACUTE/IP ONLY): Good PT Patient demonstrates impairments in the following area(s): Balance;Endurance;Motor PT Transfers Functional Problem(s): Bed Mobility;Bed to Chair;Car;Furniture PT Locomotion Functional Problem(s): Ambulation;Wheelchair Mobility;Stairs PT Plan PT Intensity: Minimum of 1-2 x/day ,45 to 90 minutes PT Frequency: 5 out of 7 days PT Duration Estimated Length of Stay: 14-16 days PT Treatment/Interventions: Ambulation/gait training;Balance/vestibular training;Community reintegration;Discharge planning;DME/adaptive equipment instruction;Functional electrical stimulation;Functional mobility training;Neuromuscular re-education;Patient/family education;Psychosocial support;Splinting/orthotics;Stair training;Therapeutic Activities;Therapeutic Exercise;UE/LE Strength taining/ROM;Wheelchair propulsion/positioning PT Transfers Anticipated Outcome(s): Mod I PT Locomotion Anticipated Outcome(s): Supervision PT Recommendation Recommendations for Other Services: Neuropsych consult Follow Up Recommendations: Home health PT Patient destination: Home Equipment Recommended: To be determined;Wheelchair cushion (measurements);Wheelchair (measurements) Skilled Therapeutic Intervention Pt received in w/c with grandson present; pt spent increased time explaining to PT history of LE weakness, surgeries, rehab, current functional level prior to admission.  Also discussed home set up, equipment and assistance available at D/C.  Pt performed w/c mobility in 20 in wide w/c with decreased efficiency; obtained 18  in wide w/c with specialty back for postural control and pt performed w/c mobility  with improved UE positioning and efficiency.  Pt participated in PT evaluation with focus on assessment of LE strength, sensation, coordination, w/c <> car transfers, sit > stand, gait and one step negotiation in // bars as documented below.  Pt also performed sit <> stand training with RW and stand pivot with RW mat <> w/c with mod-max A overall.  At end of session pt left in w/c for lunch; pt fatigued but pleased with progress today.  PT Evaluation Precautions/Restrictions Precautions Precautions: Fall General Chart Reviewed: Yes Response to Previous Treatment: Patient reporting fatigue but able to participate. Family/Caregiver Present: Yes Vital SignsTherapy Vitals Temp: 99.2 F (37.3 C) Temp Source: Oral Pulse Rate: 78 Resp: 18 BP: 123/63 Patient Position (if appropriate): Lying Oxygen Therapy SpO2: 96 % O2 Device: Not Delivered Pain Pain Assessment Pain Assessment: No/denies pain Home Living/Prior Functioning Home Living Available Help at Discharge: Family;Available PRN/intermittently Type of Home: House Home Access: Ramped entrance Home Layout: Two level;Able to live on main level with bedroom/bathroom  Lives With: Alone Prior Function Level of Independence: Independent with gait;Independent with transfers;Requires assistive device for independence Driving: Yes Comments: states has worked hard in PT from [place] place after both spinal surgeries, to [place] with lots of HEP with stationary bike, leg and arm weights over past year, able to ambulate shorter distances with rollator; no longer drives since cervical spine surgery Sensation Sensation Light Touch: Appears Intact Stereognosis: Appears Intact Hot/Cold: Appears Intact Proprioception: Appears Intact Coordination Gross Motor Movements are Fluid and Coordinated: No Fine Motor Movements are Fluid and Coordinated: Yes Motor   Motor Motor: Other (comment) Motor - Skilled Clinical Observations: Generalized weakness and debility, 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  Mobility Bed Mobility Bed Mobility: Not assessed Transfers Transfers: Yes Stand Pivot Transfers: 2: Max assist Squat Pivot Transfers: 2: Max assist Locomotion  Ambulation Ambulation: Yes Ambulation/Gait Assistance: 3: Mod assist Ambulation Distance (Feet): 10 Feet Assistive device: Parallel bars Gait Gait: Yes Gait Pattern: Impaired Gait Pattern: Step-through pattern;Decreased step length - right;Decreased step length - left;Decreased stride length;Decreased hip/knee flexion - right;Decreased hip/knee flexion - left;Decreased dorsiflexion - right;Decreased dorsiflexion - left;Poor foot clearance - left;Poor foot clearance - right Stairs / Additional Locomotion Stairs: Yes Stairs Assistance: 2: Max assist Stair Management Technique: Step to pattern;Forwards;Backwards;Two rails Number of Stairs: 1 Height of Stairs: 4 Wheelchair Mobility Wheelchair Mobility: Yes Wheelchair Assistance: 5: Careers information officer: Both upper extremities Wheelchair Parts Management: Supervision/cueing Distance: 150  Trunk/Postural Assessment  Cervical Assessment Cervical Assessment: Within Scientist, physiological Assessment: Within Functional Limits Lumbar Assessment Lumbar Assessment: Within Functional Limits Postural Control Postural Control: Deficits on evaluation  Balance Balance Balance Assessed: Yes Static Sitting Balance Static Sitting - Level of Assistance: 5: Stand by assistance Dynamic Sitting Balance Dynamic Sitting - Level of Assistance: 4: Min assist Static Standing Balance Static Standing - Level of Assistance: 3: Mod assist Dynamic Standing Balance Dynamic Standing - Level of Assistance: 3: Mod assist Extremity Assessment  RUE Assessment RUE Assessment: Exceptions to [facility] RUE  Strength RUE Overall Strength: Deficits (WFL except triceps (4/5)) LUE Assessment LUE Assessment: Exceptions to [facility] LUE Strength LUE Overall Strength: Deficits (WFL except triceps (4/5)) RLE Assessment RLE Assessment: Exceptions to [facility] RLE Strength Right Hip Flexion: 2/5 Right Knee Flexion: 3/5 Right Knee Extension: 3+/5 Right Ankle Dorsiflexion: 1/5 LLE Assessment LLE Assessment: Exceptions to [facility] LLE Strength Left Hip Flexion: 2/5 Left Knee Flexion: 3/5 Left Knee Extension: 4/5 Left Ankle Dorsiflexion: 1/5   See Function Navigator  for Current Functional Status.   Refer to Care Plan for Long Term Goals  Recommendations for other services: Neuropsych  Discharge Criteria: Patient will be discharged from PT if patient refuses treatment 3 consecutive times without medical reason, if treatment goals not met, if there is a change in medical status, if patient makes no progress towards goals or if patient is discharged from hospital.  The above assessment, treatment plan, treatment alternatives and goals were discussed and mutually agreed upon: by patient and by family  Malachy Mood 07/23/2016, 4:23 PM

## 2016-07-23 NOTE — Progress Notes (Signed)
[place] PHYSICAL MEDICINE & REHABILITATION     PROGRESS NOTE  Subjective/Complaints:  Pt seen laying in bed this AM.  He slept well overnight and feels he is getting stronger day by day.  He is ready to start therapies.   ROS: Denies CP, SOB, N/V/D.  Objective: Vital Signs: Blood pressure 140/74, pulse 70, temperature 99 F (37.2 C), temperature source Oral, resp. rate 18, height 5\' 9"  (1.753 m), weight 73.3 kg (161 lb 11.2 oz), SpO2 96 %. No results found.  Recent Labs  07/21/16 0523 07/23/16 0534  WBC 19.3* 17.2*  HGB 11.7* 11.4*  HCT 33.2* 35.0*  PLT 180 330    Recent Labs  07/22/16 0526 07/23/16 0534  NA 137 135  K 3.8 4.2  CL 100* 99*  GLUCOSE 133* 121*  BUN 19 16  CREATININE 0.69 0.68  CALCIUM 8.0* 8.4*   CBG (last 3)  No results for input(s): GLUCAP in the last 72 hours.  Wt Readings from Last 3 Encounters:  07/22/16 73.3 kg (161 lb 11.2 oz)  07/22/16 72.5 kg (159 lb 12.8 oz)  04/13/16 72.2 kg (159 lb 3.2 oz)    Physical Exam:  BP 140/74 (BP Location: Right Arm)   Pulse 70   Temp 99 F (37.2 C) (Oral)   Resp 18   Ht 5\' 9"  (1.753 m)   Wt 73.3 kg (161 lb 11.2 oz)   SpO2 96%   BMI 23.88 kg/m  Constitutional: He appears well-developed and well-nourished. Vital signs reviewed. HENT: Normocephalic. Atraumatic. Eyes: Conj and EOM intact.  Cardiovascular: Normal rate, regular rhythm and normal heart sounds.   Respiratory: Effort normal, breath sounds normal.  GI: Soft. Nontender. Genitourinary: Foley Musculoskeletal: He exhibits no edema or deformity.  Neurological: He is alert and oriented.  Motor 4+/5 proximal to distal in the UE's.  B/l LE: HF 3-/5, KE 3/5, APF 4/5, ADF trace.  Skin: Skin is warm and dry.  Psychiatric: He has a normal mood and affect. His behavior is normal. Judgment and thought content normal.    Assessment/Plan: 1. Functional deficits secondary to debility after multiple medical complications which require 3+ hours per  day of interdisciplinary therapy in a comprehensive inpatient rehab setting. Physiatrist is providing close team supervision and 24 hour management of active medical problems listed below. Physiatrist and rehab team continue to assess barriers to discharge/monitor patient progress toward functional and medical goals.  Function:  Bathing Bathing position      Bathing parts      Bathing assist        Upper Body Dressing/Undressing Upper body dressing                    Upper body assist        Lower Body Dressing/Undressing Lower body dressing                                  Lower body assist        Toileting Toileting          Toileting assist     Transfers Chair/bed transfer             Locomotion Ambulation           Wheelchair          Cognition Comprehension    Expression    Social Interaction    Problem Solving    Memory  Medical Problem List and Plan: 1.  Mobility and functional deficits secondary to debility after multiple medical complications  -Begin CIR 2.  DVT Prophylaxis/Anticoagulation: Pharmaceutical: Lovenox 3. Pain Management: tylenol. Pain controlled at present.  4. Mood: team to provide ego support as necessary. Pt appears motivated. 5. Neuropsych: This patient is capable of making decisions on his own behalf. 6. Skin/Wound Care: encourage nutrition.   -local skin care as necessary 7. Fluids/Electrolytes/Nutrition:  BMP within acceptable range on 9/9 8. E coli bacteremia/UTI: Continue to monitor for temps/symptoms rather that WBC per ID. Has completed IV antibiotics X  7  Days. On Bactrim D # 2/10  9. Hypokalemia:resolved 10. Prediabetes: dietary education 11. Protein calorie malnutrition: 12. 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 He does have a history of cervical  stenosis, myelopathy, and subsequent surgery as wel.  -asked pt to bring in his home AFO's (used them sporadically) for ?adjustements  -apparently used a steppage gait pattern around the house/community  LOS (Days) 1 A FACE TO FACE EVALUATION WAS PERFORMED  [name] 07/23/2016 9:11 AM

## 2016-07-24 ENCOUNTER — Inpatient Hospital Stay (HOSPITAL_COMMUNITY): Payer: Medicare Other

## 2016-07-24 NOTE — Progress Notes (Signed)
[place] PHYSICAL MEDICINE & REHABILITATION     PROGRESS NOTE  Subjective/Complaints:  Pt laying in bed.  He states he had a good day is, "stronger than I thought I was".  He has questions about his vitamins.   ROS: Denies CP, SOB, N/V/D.  Objective: Vital Signs: Blood pressure (!) 113/56, pulse 67, temperature 98.8 F (37.1 C), temperature source Oral, resp. rate 18, height 5\' 9"  (1.753 m), weight 73.3 kg (161 lb 11.2 oz), SpO2 96 %. No results found.  Recent Labs  07/23/16 0534  WBC 17.2*  HGB 11.4*  HCT 35.0*  PLT 330    Recent Labs  07/22/16 0526 07/23/16 0534  NA 137 135  K 3.8 4.2  CL 100* 99*  GLUCOSE 133* 121*  BUN 19 16  CREATININE 0.69 0.68  CALCIUM 8.0* 8.4*   CBG (last 3)  No results for input(s): GLUCAP in the last 72 hours.  Wt Readings from Last 3 Encounters:  07/22/16 73.3 kg (161 lb 11.2 oz)  07/22/16 72.5 kg (159 lb 12.8 oz)  04/13/16 72.2 kg (159 lb 3.2 oz)    Physical Exam:  BP (!) 113/56 (BP Location: Right Arm)   Pulse 67   Temp 98.8 F (37.1 C) (Oral)   Resp 18   Ht 5\' 9"  (1.753 m)   Wt 73.3 kg (161 lb 11.2 oz)   SpO2 96%   BMI 23.88 kg/m  Constitutional: He appears well-developed and well-nourished. Vital signs reviewed. HENT: Normocephalic. Atraumatic. Eyes: Conj and EOM intact.  Cardiovascular: Normal rate, regular rhythm and normal heart sounds.   Respiratory: Effort normal, breath sounds normal.  GI: Soft. Nontender. Genitourinary: Foley Musculoskeletal: He exhibits no edema or deformity.  Neurological: He is alert and oriented.  Motor 4+/5 proximal to distal in the UE's.  B/l LE: HF 3+/5, KE 3+/5, APF 4/5, ADF 1/5.  Skin: Skin is warm and dry.  Psychiatric: He has a normal mood and affect. His behavior is normal. Judgment and thought content normal.    Assessment/Plan: 1. Functional deficits secondary to debility after multiple medical complications which require 3+ hours per day of interdisciplinary therapy in a  comprehensive inpatient rehab setting. Physiatrist is providing close team supervision and 24 hour management of active medical problems listed below. Physiatrist and rehab team continue to assess barriers to discharge/monitor patient progress toward functional and medical goals.  Function:  Bathing Bathing position   Position: Shower  Bathing parts Body parts bathed by patient: Right arm, Left arm, Chest, Abdomen, Front perineal area, Buttocks, Right upper leg, Left upper leg, Right lower leg, Left lower leg, Back    Bathing assist Assist Level: Supervision or verbal cues, Set up   Set up : To obtain items  Upper Body Dressing/Undressing Upper body dressing   What is the patient wearing?: Pull over shirt/dress     Pull over shirt/dress - Perfomed by patient: Thread/unthread right sleeve, Thread/unthread left sleeve, Put head through opening, Pull shirt over trunk          Upper body assist Assist Level: Set up      Lower Body Dressing/Undressing Lower body dressing   What is the patient wearing?: Pants, Non-skid slipper socks     Pants- Performed by patient: Fasten/unfasten pants Pants- Performed by helper: Thread/unthread right pants leg, Thread/unthread left pants leg, Pull pants up/down   Non-skid slipper socks- Performed by helper: Don/doff right sock, Don/doff left sock  Lower body assist Assist for lower body dressing:  (Max Assist)      Toileting Toileting   Toileting steps completed by patient: Adjust clothing prior to toileting, Performs perineal hygiene, Adjust clothing after toileting      Toileting assist Assist level: Supervision or verbal cues   Transfers Chair/bed transfer   Chair/bed transfer method: Squat pivot, Stand pivot Chair/bed transfer assist level: Maximal assist (Pt 25 - 49%/lift and lower) Chair/bed transfer assistive device: Medical sales representative     Max distance: 10 Assist level: Moderate assist  (Pt 50 - 74%)   Wheelchair   Type: Manual Max wheelchair distance: 150 Assist Level: Supervision or verbal cues  Cognition Comprehension Comprehension assist level: Understands complex 90% of the time/cues 10% of the time  Expression Expression assist level: Expresses complex ideas: With extra time/assistive device  Social Interaction Social Interaction assist level: Interacts appropriately with others - No medications needed.  Problem Solving Problem solving assist level: Solves complex 90% of the time/cues < 10% of the time  Memory Memory assist level: Recognizes or recalls 90% of the time/requires cueing < 10% of the time    Medical Problem List and Plan: 1.  Mobility and functional deficits secondary to debility after multiple medical complications  -Cont CIR, already feels like he is improving 2.  DVT Prophylaxis/Anticoagulation: Pharmaceutical: Lovenox 3. Pain Management: tylenol. Pain controlled at present.  4. Mood: team to provide ego support as necessary. Pt appears motivated. 5. Neuropsych: This patient is capable of making decisions on his own behalf. 6. Skin/Wound Care: encourage nutrition.   -local skin care as necessary 7. Fluids/Electrolytes/Nutrition:  BMP within acceptable range on 9/9 8. E coli bacteremia/UTI: Continue to monitor for temps/symptoms rather that WBC per ID. Has completed IV antibiotics X  7  Days. On Bactrim D # 3/10  9. Hypokalemia:resolved 10. Prediabetes: dietary education 11. Protein calorie malnutrition: 12. 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 He does have a history of cervical stenosis, myelopathy, and subsequent surgery as wel.  -asked pt to bring in his home AFO's (used them sporadically) for ?adjustements  -apparently used a steppage gait pattern around the house/community  LOS (Days) 2 A FACE TO FACE  EVALUATION WAS PERFORMED  Elizbeth Posa Lorie Phenix 07/24/2016 8:03 AM

## 2016-07-24 NOTE — Progress Notes (Addendum)
Occupational Therapy Session Note  Patient Details  Name: Ethan Rose MRN: [id number] Date of Birth: 06-Nov-1946  Today's Date: 07/24/2016 OT Individual Time: [id number] OT Individual Time Calculation (min): 45 min   Short Term Goals: Week 1:  OT Short Term Goal 1 (Week 1): Complete shower transfer with min assist OT Short Term Goal 2 (Week 1): Dress lower body using AE, prn, with min assist OT Short Term Goal 3 (Week 1): Perform UE HEP with supervision OT Short Term Goal 4 (Week 1): Groom standing supported at sink with steadying assist for 3 minutes  Skilled Therapeutic Interventions/Progress Updates:   ADL-retraining with focus on grooming at pt's request to shave (30 min).   Therex with focus on improved independence with HEP to increase BUE strength.   Pt received supine in bed and requesting setup for shaving at sink d/t unwanted beard growth.   We setup to place w/c, pt completed transfer unassisted and groomed at sink, thoroughly shaving excess growth with good thoroughness, unassisted.   Pt was educated on goal to include standing tolerance while grooming but preferred reinforcement training on UE HEP to finish session (15 min) this date.   Pt returned to bed at end of session with contact guard assist to complete transfer.  Therapy Documentation Precautions:  Precautions Precautions: Fall Restrictions Weight Bearing Restrictions: No  Pain: No/denies pain    ADL: ADL ADL Comments: see Functional Assessment Tool  See Function Navigator for Current Functional Status.   Therapy/Group: Individual Therapy  [place] 07/24/2016, 12:45 PM

## 2016-07-25 ENCOUNTER — Inpatient Hospital Stay (HOSPITAL_COMMUNITY): Payer: Medicare Other | Admitting: Occupational Therapy

## 2016-07-25 ENCOUNTER — Inpatient Hospital Stay (HOSPITAL_COMMUNITY): Payer: Medicare Other | Admitting: Physical Therapy

## 2016-07-25 MED ORDER — FA-PYRIDOXINE-CYANOCOBALAMIN 2.5-25-2 MG PO TABS
1.0000 | ORAL_TABLET | Freq: Every day | ORAL | Status: DC
  Administered 2016-07-25 – 2016-08-10 (×17): 1 via ORAL
  Filled 2016-07-25 (×17): qty 1

## 2016-07-25 MED ORDER — PRO-STAT SUGAR FREE PO LIQD
30.0000 mL | Freq: Two times a day (BID) | ORAL | Status: DC
  Administered 2016-07-25 – 2016-08-10 (×33): 30 mL via ORAL
  Filled 2016-07-25 (×32): qty 30

## 2016-07-25 NOTE — Progress Notes (Signed)
Physical Therapy Session Note  Patient Details  Name: Ethan Rose MRN: [id number] Date of Birth: 09-14-1946  Today's Date: 07/25/2016 PT Individual Time: 0730-0830 and 1330-1445 PT Individual Time Calculation (min): 60 min and 75 min (total 135 min)    Short Term Goals: Week 1:  PT Short Term Goal 1 (Week 1): Pt will perform bed mobility on flat bed, no rails supervision PT Short Term Goal 2 (Week 1): Pt will perform stand pivot bed <> chair and car transfers with mod A with RW PT Short Term Goal 3 (Week 1): Pt will perform gait with RW x 50' with mod A PT Short Term Goal 4 (Week 1): Pt will negotiate 4 steps with bilat UE support and mod A for LE strengthening  Skilled Therapeutic Interventions/Progress Updates:   Tx 1: Pt received semi-reclined in bed, denies pain and agreeable to treatment. Pt requests to get OOB to eat breakfast. Supine>sit with S and bedrails. Sit >stand x2 attempts with bed progressively raised to improve ease of transfer and independence; able to stand from significantly raised bed with modA. Stand pivot transfer >w/c with RW and min guard for pivotal steps. Seated in w/c pt performed self feeding with modI. Stand pivot transfer w/c >toilet with grab bars and minA. Dressing performed from [facility] over toilet; modA for lower body dressing to thread catheter into pants. Upper body dressing performed setupA. Sit >stand from w/c with minA; standing marching x20 reps with BUE support on RW, min guard. Gait with RW x15' including two turns with min guard/minA; pt very fatigued at end of trial and demonstrates uncontrolled stand>sit to w/c. Setup at sink to perform grooming with modI. Transferred w/c>bed with RW and minA. Pt educated on goal of increasing out of bed tolerance gradually over next several days, and recommend pt stay out of bed when he has an hour or less break between sessions. Sit >supine in bed with S and increased time, BUEs assisted BLEs into bed d/t strength  deficits. Remained supine in bed at end of session, all needs in reach.   Tx 2: Pt received supine in bed, denies pain and agreeable to treatment. Supine>sit with S and bedrails. Stand pivot transfer bed>w/c with RW and minA from elevated bed height. W/c propulsion to gym with BUE for endurance and UE strengthening. Pt unable to sit >stand from low w/c without heavy assist. Squat pivot transfer >mat table with close S. Sit <>stand 2x5 reps with BUE support on RW and min guard with mat table elevated. Seated/supine LE strengthening exercises 2x10-15 reps; pt given handout with picture/written directions to allow pt to perform independently outside of regular therapy sessions. Squat pivot transfer with S x4 during session. Nustep x7  min with BLE level 1 for strengthening, NMR, endurance. Returned to room with w/c propulsion BUE. Squat pivot transfer >bed with S. Remained supine in bed at end of session, all needs in reach.   Therapy Documentation Precautions:  Precautions Precautions: Fall Restrictions Weight Bearing Restrictions: No   See Function Navigator for Current Functional Status.   Therapy/Group: Individual Therapy  Luberta Mutter 07/25/2016, 8:29 AM

## 2016-07-25 NOTE — IPOC Note (Signed)
Overall Plan of Care [facility]) Patient Details Name: Ethan Rose MRN: [id number] DOB: 07/31/1946  Admitting Diagnosis: sepsis  Hospital Problems: Principal Problem:   Debility Active Problems:   Spondylolisthesis of lumbar region   E coli bacteremia     Functional Problem List: Nursing Bowel, Bladder, Safety, Skin Integrity  PT Balance, Endurance, Motor  OT Balance, Endurance  SLP    TR         Basic ADL's: OT Bathing, Dressing, Toileting     Advanced  ADL's: OT Simple Meal Preparation, Laundry     Transfers: PT Bed Mobility, Bed to Chair, Car, Manufacturing systems engineer, Metallurgist: PT Ambulation, Emergency planning/management officer, Stairs     Additional Impairments: OT    SLP        TR      Anticipated Outcomes Item Anticipated Outcome  Self Feeding Independent  Swallowing      Basic self-care  Mod I  Toileting  Mod I   Bathroom Transfers Supervision  Bowel/Bladder  Continent of bowel and bladder  Transfers  Mod I  Locomotion  Supervision  Communication     Cognition     Pain  <3  Safety/Judgment  Supervision   Therapy Plan: PT Intensity: Minimum of 1-2 x/day ,45 to 90 minutes PT Frequency: 5 out of 7 days PT Duration Estimated Length of Stay: 14-16 days OT Intensity: Minimum of 1-2 x/day, 45 to 90 minutes OT Frequency: 5 out of 7 days OT Duration/Estimated Length of Stay: 10-14 days         Team Interventions: Nursing Interventions Patient/Family Education, Discharge Planning, Disease Management/Prevention  PT interventions Ambulation/gait training, Training and development officer, Community reintegration, Discharge planning, DME/adaptive equipment instruction, Functional electrical stimulation, Functional mobility training, Neuromuscular re-education, Patient/family education, Psychosocial support, Splinting/orthotics, Stair training, Therapeutic Activities, Therapeutic Exercise, UE/LE Strength taining/ROM, Wheelchair propulsion/positioning   OT Interventions Discharge planning, DME/adaptive equipment instruction, Patient/family education, Functional mobility training, Therapeutic Activities, Therapeutic Exercise, UE/LE Strength taining/ROM, Self Care/advanced ADL retraining, Balance/vestibular training  SLP Interventions    TR Interventions    SW/CM Interventions Discharge Planning, Patient/Family Education, Psychosocial Support    Team Discharge Planning: Destination: PT-Home ,OT- Home , SLP-  Projected Follow-up: PT-Home health PT, OT-  Home health OT, SLP-  Projected Equipment Needs: PT-To be determined, Wheelchair cushion (measurements), Wheelchair (measurements), OT- To be determined, SLP-  Equipment Details: PT- , OT-  Patient/family involved in discharge planning: PT- Patient, Family member/caregiver,  OT-Patient, SLP-   MD ELOS: 11-14 days Medical Rehab Prognosis:  Excellent Assessment: The patient has been admitted for CIR therapies with the diagnosis of debility after multiple medical issues. The team will be addressing functional mobility, strength, stamina, balance, safety, adaptive techniques and equipment, self-care, bowel and bladder mgt, patient and caregiver education, orthotic adjustments, ego support, community reintegration. Goals have been set at supervision to mod I with mobility and self-care tasks. Meredith Staggers, MD, FAAPMR      See Team Conference Notes for weekly updates to the plan of care

## 2016-07-25 NOTE — Progress Notes (Signed)
Occupational Therapy Session Note  Patient Details  Name: Ethan Rose MRN: [id number] Date of Birth: [birth year]  Today's Date: 07/25/2016 OT Individual Time: 1030-1130 OT Individual Time Calculation (min): 60 min     Short Term Goals: Week 1:  OT Short Term Goal 1 (Week 1): Complete shower transfer with min assist OT Short Term Goal 2 (Week 1): Dress lower body using AE, prn, with min assist OT Short Term Goal 3 (Week 1): Perform UE HEP with supervision OT Short Term Goal 4 (Week 1): Groom standing supported at sink with steadying assist for 3 minutes  Skilled Therapeutic Interventions/Progress Updates:    Pt seen for OT session focusing on activity tolerance, standing balance, and sit <> stand. Pt sitting up in w/Rose upon arrival, agreeable to tx session. He declined bathing/dressing this session. He ambulated with RW and CGA to sink. Attempted to have pt stand at sink to wash hands, however, pt unable to tolerate extended standing trial (>~30 sec) and required seated rest break prior to completing task.  In therapy gym, pt completed sit <> stands to RW from therapy mat, push-up handle placed on mat to assist. Pt initially requiring min A, however, pushing off strongly with back of legs on mat. Educated pt on potential fall risk/ safety hazard with this and work on proper technique for sit > stand using LEs.  Addressed standing balance without UE support. Pt required to remove clothes pins placed around pant waist in simulated LB dressing task. Pt able to complete, with maintaining one UE support on RW and steadying assist. Pt unable to tolerate >~1 minute of standing with B UE support, tolerating ~<30 seconds of standing without UE support.  Pt returned to room at end of session, agreeable to staying up in chair until lunch.  Throughout session, discussed/ educated regarding OT/PT goals, POC, PLOF, energy conservation, and d/Rose planning.     Therapy Documentation Precautions:   Precautions Precautions: Fall Restrictions Weight Bearing Restrictions: No Pain:   No/ denies pain ADL: ADL ADL Comments: see Functional Assessment Tool  See Function Navigator for Current Functional Status.   Therapy/Group: Individual Therapy  Ethan Rose 07/25/2016, 12:56 PM

## 2016-07-25 NOTE — Progress Notes (Addendum)
Contacted GU regarding plans for further surgery.  Foley in place per patient preference--to remove when mobile.  Per Urology--Any surgery to be done after patient regains strength/after rehab and would like to be informed of d/c date.

## 2016-07-25 NOTE — Progress Notes (Signed)
[place] PHYSICAL MEDICINE & REHABILITATION     PROGRESS NOTE  Subjective/Complaints:  Up in bathroom. Standing with therapy. Stamina better. Had a good weekend.    ROS: Denies CP, SOB, N/V/D.  Objective: Vital Signs: Blood pressure 120/68, pulse 69, temperature 99 F (37.2 C), temperature source Oral, resp. rate 18, height 5\' 9"  (1.753 m), weight 73.3 kg (161 lb 11.2 oz), SpO2 98 %. No results found.  Recent Labs  07/23/16 0534  WBC 17.2*  HGB 11.4*  HCT 35.0*  PLT 330    Recent Labs  07/23/16 0534  NA 135  K 4.2  CL 99*  GLUCOSE 121*  BUN 16  CREATININE 0.68  CALCIUM 8.4*   CBG (last 3)  No results for input(s): GLUCAP in the last 72 hours.  Wt Readings from Last 3 Encounters:  07/22/16 73.3 kg (161 lb 11.2 oz)  07/22/16 72.5 kg (159 lb 12.8 oz)  04/13/16 72.2 kg (159 lb 3.2 oz)    Physical Exam:  BP 120/68 (BP Location: Right Arm)   Pulse 69   Temp 99 F (37.2 C) (Oral)   Resp 18   Ht 5\' 9"  (1.753 m)   Wt 73.3 kg (161 lb 11.2 oz)   SpO2 98%   BMI 23.88 kg/m  Constitutional: He appears well-developed and well-nourished. Vital signs reviewed. HENT: Normocephalic. Atraumatic. Eyes: Conj and EOM intact.  Cardiovascular: Normal rate, regular rhythm and normal heart sounds.   Respiratory: Effort normal, breath sounds normal.  GI: Soft. Nontender. Genitourinary: Foley Musculoskeletal: He exhibits no edema or deformity.  Neurological: He is alert and oriented.  Motor 4+/5 proximal to distal in the UE's.  B/l LE: HF 3-/5, KE 3/5, APF 4/5, ADF trace.  Skin: Skin is warm and dry.  Psychiatric: He has a normal mood and affect. His behavior is normal. Judgment and thought content normal.    Assessment/Plan: 1. Functional deficits secondary to debility after multiple medical complications which require 3+ hours per day of interdisciplinary therapy in a comprehensive inpatient rehab setting. Physiatrist is providing close team supervision and 24 hour  management of active medical problems listed below. Physiatrist and rehab team continue to assess barriers to discharge/monitor patient progress toward functional and medical goals.  Function:  Bathing Bathing position   Position: Shower  Bathing parts Body parts bathed by patient: Right arm, Left arm, Chest, Abdomen, Front perineal area, Buttocks, Right upper leg, Left upper leg, Right lower leg, Left lower leg, Back    Bathing assist Assist Level: Supervision or verbal cues, Set up   Set up : To obtain items  Upper Body Dressing/Undressing Upper body dressing   What is the patient wearing?: Pull over shirt/dress     Pull over shirt/dress - Perfomed by patient: Thread/unthread right sleeve, Thread/unthread left sleeve, Put head through opening, Pull shirt over trunk          Upper body assist Assist Level: Set up      Lower Body Dressing/Undressing Lower body dressing   What is the patient wearing?: Pants, Non-skid slipper socks     Pants- Performed by patient: Pull pants up/down Pants- Performed by helper: Thread/unthread left pants leg, Thread/unthread right pants leg   Non-skid slipper socks- Performed by helper: Don/doff right sock, Don/doff left sock                  Lower body assist Assist for lower body dressing:  (modA)      Toileting Toileting Toileting activity did  not occur: No continent bowel/bladder event (foley, no BM) Toileting steps completed by patient: Adjust clothing prior to toileting, Performs perineal hygiene, Adjust clothing after toileting      Toileting assist Assist level: Touching or steadying assistance (Pt.75%)   Transfers Chair/bed transfer   Chair/bed transfer method: Stand pivot Chair/bed transfer assist level: Moderate assist (Pt 50 - 74%/lift or lower) Chair/bed transfer assistive device: Medical sales representative     Max distance: 15 Assist level: Touching or steadying assistance (Pt > 75%)   Wheelchair    Type: Manual Max wheelchair distance: 150 Assist Level: Supervision or verbal cues  Cognition Comprehension Comprehension assist level: Understands complex 90% of the time/cues 10% of the time  Expression Expression assist level: Expresses complex ideas: With extra time/assistive device  Social Interaction Social Interaction assist level: Interacts appropriately with others - No medications needed.  Problem Solving Problem solving assist level: Solves complex 90% of the time/cues < 10% of the time  Memory Memory assist level: Recognizes or recalls 90% of the time/requires cueing < 10% of the time    Medical Problem List and Plan: 1.  Mobility and functional deficits secondary to debility after multiple medical complications  -continue CIR therapies  -stamina improving 2.  DVT Prophylaxis/Anticoagulation: Pharmaceutical: Lovenox 3. Pain Management: tylenol. Pain controlled.  4. Mood: team to provide ego support as necessary. Pt appears motivated. 5. Neuropsych: This patient is capable of making decisions on his own behalf. 6. Skin/Wound Care: encourage nutrition.   -local skin care as necessary 7. Fluids/Electrolytes/Nutrition:  BMP within acceptable range on 9/9 8. E coli bacteremia/UTI: Continue to monitor for temps/symptoms rather that WBC per ID. Bactrim for 10 days   -continue foley  -lithotripsy in 7-10 days?---follow up with urology 9. Hypokalemia:resolved 10. Prediabetes: dietary education 11. Protein calorie malnutrition: 12. 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 He does have a history of cervical stenosis, myelopathy, and subsequent surgery as wel.  -pt brought ipsilon AFO's--probably needs new shoes however for better support.  LOS (Days) 3 A FACE TO FACE EVALUATION WAS PERFORMED  SWARTZ,ZACHARY T 07/25/2016 9:32 AM

## 2016-07-26 ENCOUNTER — Inpatient Hospital Stay (HOSPITAL_COMMUNITY): Payer: Medicare Other | Admitting: Physical Therapy

## 2016-07-26 ENCOUNTER — Inpatient Hospital Stay (HOSPITAL_COMMUNITY): Payer: Medicare Other | Admitting: Occupational Therapy

## 2016-07-26 MED ORDER — LIDOCAINE HCL 2 % EX GEL: CUTANEOUS | Status: DC | PRN

## 2016-07-26 NOTE — Progress Notes (Signed)
Occupational Therapy Session Note  Patient Details  Name: Ethan Rose MRN: [id number] Date of Birth: 07/15/1946  Today's Date: 07/26/2016 OT Individual Time: [id number] OT Individual Time Calculation (min): 75 min     Short Term Goals:Week 1:  OT Short Term Goal 1 (Week 1): Complete shower transfer with min assist OT Short Term Goal 2 (Week 1): Dress lower body using AE, prn, with min assist OT Short Term Goal 3 (Week 1): Perform UE HEP with supervision OT Short Term Goal 4 (Week 1): Groom standing supported at sink with steadying assist for 3 minutes  Skilled Therapeutic Interventions/Progress Updates:    Pt seen for OT ADL bathing/dressing session. Pt sitting up in w/c upon arrival, voicing desire for showering task. Pt completed sit <> stand at [place] with mod-max A , pt demonstrates difficulty in switching UEs from pushing up on w/c to holding onto RW. He ambulated ~60ft into bathroom and showered seated on tub transfer bench, lateral leans and LH sponge used to complete LB bathing. Squat pivot out of shower completed to w/c.  He dressed seated in w/c, requiring assist to thread LEs into pants, able to stand at [facility] with steadying assist to pull pants up.  He self propelled w/c to therapy gym for UE strengthening. Pt set-up on SCI FIT arm cycle, pt goal of going at least 5 minutes. With distraction via conversation with therapist, pt tolerated 10 minutes on [place] initially at level 1/5, progressing to level 3.5. Pt completed x10 reps on B sides of SAQ seated in w/c utilizing soccer ball with steadying assist provided for control from therapist. Pt returned to room at end of session, left seated in w/c with all needs in reach.   Therapy Documentation Precautions:  Precautions Precautions: Fall Restrictions Weight Bearing Restrictions: No Pain:   No/ denies pain ADL: ADL ADL Comments: see Functional Assessment Tool  See Function Navigator for Current Functional  Status.   Therapy/Group: Individual Therapy  [name], Mckensey Berghuis C 07/26/2016, 7:07 AM

## 2016-07-26 NOTE — Progress Notes (Signed)
Physical Therapy Session Note  Patient Details  Name: Ethan Rose MRN: [id number] Date of Birth: [birth year]  Today's Date: 07/26/2016 PT Individual Time: 1000-1100 and 1300-1400 PT Individual Time Calculation (min): 60 min and 60 min (total 120 min)    Short Term Goals: Week 1:  PT Short Term Goal 1 (Week 1): Pt will perform bed mobility on flat bed, no rails supervision PT Short Term Goal 2 (Week 1): Pt will perform stand pivot bed <> chair and car transfers with mod A with RW PT Short Term Goal 3 (Week 1): Pt will perform gait with RW x 50' with mod A PT Short Term Goal 4 (Week 1): Pt will negotiate 4 steps with bilat UE support and mod A for LE strengthening  Skilled Therapeutic Interventions/Progress Updates:   Tx 1: Pt received seated in w/c, denies pain and agreeable to treatment. Does report he is very fatigued from previous OT session, however able to tolerate entire session with occasional rest breaks. W/c propulsion to gym BUE [phone] with S for BUE strengthening. Gait x35', x20' with RW and min guard. Poor eccentric control stand>sit after completion of trials. W/c propulsion x50' with BLE for hamstring strengthening. Stand pivot transfer w/c <>mat table with RW and minA. Sit <>stand x6 reps, Standing mini squats 2x10 reps with BUE support on RW and pt fatigues quickly. Gait x30' with RW and min guard. Returned to room in w/c totalA for energy conservation. Stand pivot transfer to bed with RW and minA. Sit >supine with S. Remained supine in bed at end of session, alarm intact and all needs in reach.   Tx 2: Pt received supine in bed, denies pain and agreeable to treatment. Supine>sit with bedrails and HOB elevated. Seated on EOB pt attempted to don BLE AFOs/shoes, requires assist for threading heel into shoe (did not have shoe horn available), and stool for foot to rest on while tieing laces. Stand pivot transfer with bed elevated minA with RW. Sit <>stand at sink, modA to stand from  lower surface. In standing performed glute sets, marching, hip abduction, hamstring curls. Squat pivot transfer w/c <>nustep with close S. Nustep x10 min with BUE/BLE for strengthening, aerobic endurance. Stand pivot transfer with RW to return to bed with modA from low w/c seat. Sit >supine with HOB flat and no bedrails to simulate home environment; S overall. Remained supine in bed at end of session, all needs in reach.   Therapy Documentation Precautions:  Precautions Precautions: Fall Restrictions Weight Bearing Restrictions: No   See Function Navigator for Current Functional Status.   Therapy/Group: Individual Therapy  Luberta Mutter 07/26/2016, 12:23 PM

## 2016-07-26 NOTE — Progress Notes (Signed)
Social Work  Social Work Assessment and Plan  Patient Details  Name: Ethan Rose MRN: [id number] Date of Birth: June 28, 1946  Today's Date: 07/25/2016  Problem List:  Patient Active Problem List   Diagnosis Date Noted  . Debility 07/22/2016  . Pressure ulcer 07/18/2016  . E coli bacteremia   . Leukocytosis   . Sepsis ([place]) 07/16/2016  . Severe sepsis with septic shock ([place]) 07/16/2016  . Pyelonephritis 07/16/2016  . Left ureteral stone 07/16/2016  . Septic shock ([place]) 07/16/2016  . Cervical myelopathy ([place]) 03/18/2016  . 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 Other malaise and fatigue 05/14/2014  . Health maintenance examination 04/29/2013  . Weakness of both legs 04/29/2013  . Leg cramps 04/29/2013  . Elevated blood pressure reading without diagnosis of hypertension 03/30/2012  . Dizziness 03/30/2012   Past Medical History:  Past Medical History:  Diagnosis Date  . Allergic rhinitis   . Arthritis    carpal tunnel, L hand gout, arthritis - spine   . 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 Cervical myelopathy ([place])   . Chronic low back pain   . Essential hypertension   . GERD (gastroesophageal reflux disease)   . Hay fever   . Kidney calculi    Multiple passed in the past.   . Leukocytosis   . Neuromuscular disorder (HCC)    spondylolisthesis - lumbar region  . Olecranon bursitis October 21, 202017   Dr. Percell Miller  . Physical deconditioning   . Prediabetes 2015   HbA1c 6.4%: pt saw nutritionist  . Slow transit constipation   . 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 Vitamin D deficiency    Past Surgical History:  Past Surgical History:  Procedure Laterality Date  . ABDOMINAL EXPOSURE N/A 03/24/2015   Procedure: ABDOMINAL EXPOSURE;  Surgeon: [name], MD;  Location: [facility];  Service: Vascular;  Laterality: N/A;  left  side approach  . ANTERIOR CERVICAL DECOMP/DISCECTOMY FUSION N/A 03/18/2016   Procedure: Cervical four-five Cervical five-six Cervical six-seven Anterior cervical decompression/diskectomy/fusion;  Surgeon: [name], MD;  Location: [facility] NEURO ORS;  Service: Neurosurgery;  Laterality: N/A;  n  . ANTERIOR LAT LUMBAR FUSION Right 03/24/2015   Procedure: Right Lumbar two-Three,Lumbar Three-Four,Lumbar Four-Five Anterior lateral lumbar interbody fusion ;  Surgeon: [name], MD;  Location: [place] NEURO ORS;  Service: Neurosurgery;  Laterality: Right;  right  . COLONOSCOPY  approx 2011   Dr. [name] (normal per pt report)  . CYSTOSCOPY WITH STENT PLACEMENT Left 07/16/2016   Procedure: CYSTOSCOPY, RETROGRADE WITH LEFT URETERAL STENT PLACEMENT;  Surgeon: [name], MD;  Location: [facility]S;  Service: Urology;  Laterality: Left;  . HERNIA REPAIR  1993   Bilateral inguinal (urologist did these procedures)  . LITHOTRIPSY     Multiple; most recent was spring 2015  . POSTERIOR LUMBAR FUSION 4 LEVEL N/A 03/27/2015   Procedure: Posterior pedicle subtraction osteotomy with T10 to Iliac fusion with Dr. [name] assisting;  Surgeon: [name], MD;  Location: [facility] ORS;  Service: Neurosurgery;  Laterality: N/A;  Posterior pedicle subtraction osteotomy with T10 to Iliac fusion with Dr. [name] assisting  . TONSILLECTOMY  1954   Social History:  reports that he quit smoking about 2 years ago. His smoking use included Cigarettes. He smoked 0.50 packs per day. He has quit using smokeless tobacco. His smokeless tobacco use included  Chew. He reports that he drinks about 4.8 oz of alcohol per week . He reports that he does not use drugs.  Family / Support Systems Marital Status: Widow/Widower How Long?: July 2015 Patient Roles: Parent Children: daughter, Marc Morgans @ [phone] and son-in-law, Ray @ [phone] Other Supports: grandson, Randa Ngo - has lived with pt since he was 36;  now  living in [place] but does try and come up on the weekends when able. Anticipated Caregiver: daughter, brother and other family members Ability/Limitations of Caregiver: daughter works, brother can assist short term Caregiver Availability: Other (Comment) (pt reports family can provide close to 24/7 at least initially) Family Dynamics: Pt describes very close relationships with children, grandson and brother.  Social History Preferred language: English Religion: Methodist Cultural Background: NA Education: HS Read: Yes Write: Yes Employment Status: Retired Freight forwarder Issues: None Guardian/Conservator: None - per MD, pt is capable of making decisions on his own behalf.   Abuse/Neglect Physical Abuse: Denies Verbal Abuse: Denies Sexual Abuse: Denies Exploitation of patient/patient's resources: Denies Self-Neglect: Denies  Emotional Status Pt's affect, behavior adn adjustment status: Pt very pleasant, talkative and enjoys sharing stories about his grandson and family.  He admits to frustration with yet another hospitalization but hopeful CIR tx will be more aggressive and "I'll be on my way home quicker."  He talks openly about his wife's death in 2014/02/27 and becomes a little tearful, however, denies any significant emotional distress.  Will monitor mood and refer for neuropsychology as indicated. Recent Psychosocial Issues: Two back surgeries recently and was [facility]. Pyschiatric History: None Substance Abuse History: None  Patient / Family Perceptions, Expectations & Goals Pt/Family understanding of illness & functional limitations: Pt with good understanding of his current medical issues and extent of debility/ need for CIR. Premorbid pt/family roles/activities: Pt was independent overall using a rollator. Anticipated changes in roles/activities/participation: Little change anticipated if pt able to reach mod i / supervision goals.    Pt/family expectations/goals: "I just hope I get where I need to be faster."  [employer] Premorbid Home Care/DME Agencies: Other (Comment) [facility]) Transportation available at discharge: yes  Discharge Planning Living Arrangements: Alone Support Systems: Children, Other relatives, Friends/neighbors Type of Residence: Private residence Insurance Resources: [employer], Multimedia programmer (specify) Financial Resources: Social Security Financial Screen Referred: No Living Expenses: Own Money Management: Patient Does the patient have any problems obtaining your medications?: No Home Management: Pt with family assisting as needed since having his back surgeries. Patient/Family Preliminary Plans: Pt plans to return to his own home with family to stay initially. Social Work Anticipated Follow Up Needs: HH/OP Expected length of stay: 14-16 days  Clinical Impression Very pleasant gentleman here for debility following sepsis.  Good family support and goals being set for mod ind - supervision.  Pt denies any significant emotional distress.  Will follow for support and d/c planning needs.  Detria Cummings 07/25/2016, 4:43 PM

## 2016-07-26 NOTE — Progress Notes (Signed)
Patient information reviewed and entered into eRehab System by [name], covering PPS coordinator. Information including medical coding and functional independence measure will be reviewed and updated through discharge.  Per nursing, patient was given "Data Collection Information Summary for Patients in Inpatient Rehabilitation Facilities with attached Privacy Act Statement Health Care Records" upon admission.     

## 2016-07-26 NOTE — Progress Notes (Signed)
Ethan Rose PHYSICAL MEDICINE & REHABILITATION     PROGRESS NOTE  Subjective/Complaints:  Up in bathroom again this AM. No new complaints. Therapy tiring but he's participating and stamina is better   ROS: Denies CP, SOB, N/V/D.  Objective: Vital Signs: Blood pressure 115/62, pulse 66, temperature 98.3 F (36.8 C), temperature source Oral, resp. rate 18, height 5\' 9"  (1.753 m), weight 73.3 kg (161 lb 11.2 oz), SpO2 98 %. No results found. No results for input(s): WBC, HGB, HCT, PLT in the last 72 hours. No results for input(s): NA, K, CL, GLUCOSE, BUN, CREATININE, CALCIUM in the last 72 hours.  Invalid input(s): CO CBG (last 3)  No results for input(s): GLUCAP in the last 72 hours.  Wt Readings from Last 3 Encounters:  07/22/16 73.3 kg (161 lb 11.2 oz)  07/22/16 72.5 kg (159 lb 12.8 oz)  04/13/16 72.2 kg (159 lb 3.2 oz)    Physical Exam:  BP 115/62 (BP Location: Right Arm)   Pulse 66   Temp 98.3 F (36.8 C) (Oral)   Resp 18   Ht 5\' 9"  (1.753 m)   Wt 73.3 kg (161 lb 11.2 oz)   SpO2 98%   BMI 23.88 kg/m  Constitutional: He appears well-developed and well-nourished. Vital signs reviewed. HENT: Normocephalic. Atraumatic. Eyes: Conj and EOM intact.  Cardiovascular: Normal rate, regular rhythm and normal heart sounds.   Respiratory: Effort normal, breath sounds normal.  GI: Soft. Nontender. Genitourinary: Foley Musculoskeletal: He exhibits no edema or deformity.  Neurological: He is alert and oriented.  Motor 4+/5 proximal to distal in the UE's.  B/l LE: HF 3-/5, KE 3/5, APF 4/5, ADF trace.  Skin: Skin is warm and dry.  Psychiatric: He has a normal mood and affect. His behavior is normal. Judgment and thought content normal.    Assessment/Plan: 1. Functional deficits secondary to debility after multiple medical complications which require 3+ hours per day of interdisciplinary therapy in a comprehensive inpatient rehab setting. Physiatrist is providing close team  supervision and 24 hour management of active medical problems listed below. Physiatrist and rehab team continue to assess barriers to discharge/monitor patient progress toward functional and medical goals.  Function:  Bathing Bathing position   Position: Shower  Bathing parts Body parts bathed by patient: Right arm, Left arm, Chest, Abdomen, Front perineal area, Buttocks, Right upper leg, Left upper leg, Right lower leg, Left lower leg, Back    Bathing assist Assist Level: Supervision or verbal cues, Set up   Set up : To obtain items  Upper Body Dressing/Undressing Upper body dressing   What is the patient wearing?: Pull over shirt/dress     Pull over shirt/dress - Perfomed by patient: Thread/unthread right sleeve, Thread/unthread left sleeve, Put head through opening, Pull shirt over trunk          Upper body assist Assist Level: Set up      Lower Body Dressing/Undressing Lower body dressing   What is the patient wearing?: Pants, Non-skid slipper socks     Pants- Performed by patient: Pull pants up/down Pants- Performed by helper: Thread/unthread left pants leg, Thread/unthread right pants leg   Non-skid slipper socks- Performed by helper: Don/doff right sock, Don/doff left sock                  Lower body assist Assist for lower body dressing:  (modA)      Toileting Toileting Toileting activity did not occur: No continent bowel/bladder event (foley, no BM) Toileting steps  completed by patient: Adjust clothing prior to toileting, Performs perineal hygiene, Adjust clothing after toileting      Toileting assist Assist level: Touching or steadying assistance (Pt.75%)   Transfers Chair/bed transfer   Chair/bed transfer method: Stand pivot Chair/bed transfer assist level: Moderate assist (Pt 50 - 74%/lift or lower) Chair/bed transfer assistive device: Medical sales representative     Max distance: 15 Assist level: Touching or steadying assistance (Pt >  75%)   Wheelchair   Type: Manual Max wheelchair distance: 150 Assist Level: Supervision or verbal cues  Cognition Comprehension Comprehension assist level: Understands complex 90% of the time/cues 10% of the time  Expression Expression assist level: Expresses complex ideas: With extra time/assistive device  Social Interaction Social Interaction assist level: Interacts appropriately with others - No medications needed.  Problem Solving Problem solving assist level: Solves complex 90% of the time/cues < 10% of the time  Memory Memory assist level: Recognizes or recalls 90% of the time/requires cueing < 10% of the time    Medical Problem List and Plan: 1.  Mobility and functional deficits secondary to debility after multiple medical complications  -continue CIR therapies  -stamina improving 2.  DVT Prophylaxis/Anticoagulation: Pharmaceutical: Lovenox 3. Pain Management: tylenol. Pain controlled.  4. Mood: team to provide ego support as necessary. Pt appears motivated. 5. Neuropsych: This patient is capable of making decisions on his own behalf. 6. Skin/Wound Care: encourage nutrition.   -local skin care as necessary 7. Fluids/Electrolytes/Nutrition:  BMP within acceptable range on 9/9 8. E coli bacteremia/UTI: Continue to monitor for temps/symptoms rather that WBC per ID. Bactrim for 10 days   -will dc foley later today  -lithotripsy in 7-10 days?---follow up with urology 9. Hypokalemia:resolved 10. Prediabetes: dietary education 11. Protein calorie malnutrition: improving intake 12. 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 He does have a history of cervical stenosis, myelopathy, and subsequent surgery as wel.  -AFO/shoewear adjustment if needed  LOS (Days) 4 A FACE TO FACE EVALUATION WAS PERFORMED  Ethan Rose 07/26/2016 8:41 AM

## 2016-07-26 NOTE — Care Management Note (Signed)
[facility] Individual Statement of Services  Patient Name:  Ethan Rose  Date:  07/25/2016  Welcome to the [facility].  Our goal is to provide you with an individualized program based on your diagnosis and situation, designed to meet your specific needs.  With this comprehensive rehabilitation program, you will be expected to participate in at least 3 hours of rehabilitation therapies Monday-Friday, with modified therapy programming on the weekends.  Your rehabilitation program will include the following services:  Physical Therapy (PT), Occupational Therapy (OT), 24 hour per day rehabilitation nursing, Therapeutic Recreaction (TR), Case Management (Social Worker), Rehabilitation Medicine, Nutrition Services and Pharmacy Services  Weekly team conferences will be held on Tuesdays to discuss your progress.  Your Social Worker will talk with you frequently to get your input and to update you on team discussions.  Team conferences with you and your family in attendance may also be held.  Expected length of stay: 14-16 days Overall anticipated outcome: mod independent  Depending on your progress and recovery, your program may change. Your Social Worker will coordinate services and will keep you informed of any changes. Your Social Worker's name and contact numbers are listed  below.  The following services may also be recommended but are not provided by the [facility] will be made to provide these services after discharge if needed.  Arrangements include referral to agencies that provide these services.  Your insurance has been verified to be:  Medicare and [place] Your primary doctor is:  Dr. Ernestine Conrad  Pertinent information will be shared with your doctor and your insurance company.  Social Worker:  [place], [place] or ([phone]   Information discussed with and copy given to patient by: Lennart Pall, 07/25/2016, 4:44 PM

## 2016-07-26 NOTE — Evaluation (Signed)
Recreational Therapy Assessment and Plan  Patient Details  Name: Ethan Rose MRN: [id number] Date of Birth: 04/14/1946 Today's Date: 07/26/2016  Rehab Potential: Good ELOS: 2 weeks   Assessment Problem List:      Patient Active Problem List   Diagnosis Date Noted  . Debility 07/22/2016  . Pressure ulcer 07/18/2016  . E coli bacteremia   . Leukocytosis   . Sepsis ([place]) 07/16/2016  . Severe sepsis with septic shock ([place]) 07/16/2016  . Pyelonephritis 07/16/2016  . Left ureteral stone 07/16/2016  . Septic shock ([place]) 07/16/2016  . Cervical myelopathy ([place]) 03/18/2016  . 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 Other malaise and fatigue 05/14/2014  . Health maintenance examination 04/29/2013  . Weakness of both legs 04/29/2013  . Leg cramps 04/29/2013  . Elevated blood pressure reading without diagnosis of hypertension 03/30/2012  . Dizziness 03/30/2012    Past Medical History:      Past Medical History:  Diagnosis Date  . Allergic rhinitis   . Arthritis    carpal tunnel, L hand gout, arthritis - spine   . 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 Cervical myelopathy ([place])   . Chronic low back pain   . Essential hypertension   . GERD (gastroesophageal reflux disease)   . Hay fever   . Kidney calculi    Multiple passed in the past.   . Leukocytosis   . Neuromuscular disorder (HCC)    spondylolisthesis - lumbar region  . Olecranon bursitis 2020/01/2616   Dr. Percell Miller  . Physical deconditioning   . Prediabetes 2015   HbA1c 6.4%: pt saw nutritionist  . Slow transit constipation   . 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 Vitamin D deficiency    Past Surgical History:  Past Surgical History:  Procedure Laterality Date  . ABDOMINAL EXPOSURE N/A 03/24/2015   Procedure: ABDOMINAL EXPOSURE;  Surgeon: [name], MD;  Location: [facility] NEURO ORS;  Service: Vascular;  Laterality: N/A;  left side approach  . ANTERIOR CERVICAL DECOMP/DISCECTOMY FUSION N/A 03/18/2016   Procedure: Cervical four-five Cervical five-six Cervical six-seven Anterior cervical decompression/diskectomy/fusion;  Surgeon: [name], MD;  Location: [place] NEURO ORS;  Service: Neurosurgery;  Laterality: N/A;  n  . ANTERIOR LAT LUMBAR FUSION Right 03/24/2015   Procedure: Right Lumbar two-Three,Lumbar Three-Four,Lumbar Four-Five Anterior lateral lumbar interbody fusion ;  Surgeon: [name], MD;  Location: [facility] NEURO ORS;  Service: Neurosurgery;  Laterality: Right;  right  . COLONOSCOPY  approx 2011   Dr. [name] (normal per pt report)  . CYSTOSCOPY WITH STENT PLACEMENT Left 07/16/2016   Procedure: CYSTOSCOPY, RETROGRADE WITH LEFT URETERAL STENT PLACEMENT;  Surgeon: [name], MD;  Location: [facility]S;  Service: Urology;  Laterality: Left;  . HERNIA REPAIR  1993   Bilateral inguinal (urologist did these procedures)  . LITHOTRIPSY     Multiple; most recent was spring 2015  . POSTERIOR LUMBAR FUSION 4 LEVEL N/A 03/27/2015   Procedure: Posterior pedicle subtraction osteotomy with T10 to Iliac fusion with Dr. [name] assisting;  Surgeon: [name], MD;  Location: [facility] NEURO ORS;  Service: Neurosurgery;  Laterality: N/A;  Posterior pedicle subtraction osteotomy with T10 to Iliac fusion with Dr. [name] assisting  . TONSILLECTOMY  1954    Assessment & Plan Clinical Impression: Patient is a 70 y.o.gentleman with a history of nephrolithiasis s/p lithotripsy x 4  in the past, prior cervical and lumbar spine surgeries, and GERD who presented to the ED in [facility] for evaluation of two days of progressive left flank pain associated with fever to 102 (documented at home) and generalized weakness. Symptoms started Thursday morning. Pain and fever were refractory to advil and hydrocodone. He attempted to get an appointment  with his primary urologist on Friday, but he was unsuccessful. He denies dysuria or hematuria. No nausea or vomiting. No syncope.  CT scan stone protocol showed an 84m mid left ureteral calculus with moderate left sided hydroureteronephrosis. Urology was consulted. Code Sepsis was activated. U/A showed infection (nitrite positive, large leukocytes, TNTC WBC, many bacteria) with mild AKI (BUN 41, Cr 1.29), leukocytosis of 19.7 and lactic acid level of 4.81. Blood and urine cultures were sent. The patient received IV Rocephin. He received 2.25L of NS based on sepsis protocol. [facility] for left ureteral stent placement.  Post-operatively, [facility]. Transferred to telemetry 07/18/2016.  Initially on Vanc and Zosyn, transitioned to Rocephin based on blood cultures results which grew E. Coli sensitive to Rocephin. Urine culture also grew E Coli. Leukocytosis improved from admission but with elevation on 9/7 to 19 K. MD spoke with Dr. JMichel Bickersof ID and he recommended to cease obtaining daily CBCs for pt clinically improving.  Patient transferred to CIR on 07/22/2016.   Pt presents with decreased activity tolerance, decreased functional mobility, decreased balance Limiting pt's independence with leisure/community pursuits.  Leisure History/Participation Premorbid leisure interest/current participation: NPetra [place]- Hunting;Community - Travel (Comment);Community - Grocery store;Sports - Golf Other Leisure Interests: Television;Movies Leisure Participation Style: With Family/Friends Awareness of Community Resources: Good-identify 3 post discharge leisure resources Psychosocial / Spiritual Does patient have pets?: No (used to) Social interaction - Mood/Behavior: Cooperative CAcademic librarianAppropriate for Education?: Yes Recreational Therapy  Orientation Orientation -Reviewed with patient: Available activity resources Strengths/Weaknesses Patient Strengths/Abilities: Willingness to participate Patient weaknesses: Physical limitations;Minimal Premorbid Leisure Activity TR Patient demonstrates impairments in the following area(s): Endurance;Pain;Safety  Plan Rec Therapy Plan Is patient appropriate for Therapeutic Recreation?: Yes Rehab Potential: Good Treatment times per week: Min 1 time per week>20 minutes Estimated Length of Stay: 2 weeks TR Treatment/Interventions: Adaptive equipment instruction;1:1 session;Balance/vestibular training;Functional mobility training;Community reintegration;Patient/family education;Therapeutic activities;Recreation/leisure participation;Therapeutic exercise;UE/LE Coordination activities  Recommendations for other services: None  Discharge Criteria: Patient will be discharged from TR if patient refuses treatment 3 consecutive times without medical reason.  If treatment goals not met, if there is a change in medical status, if patient makes no progress towards goals or if patient is discharged from hospital.  The above assessment, treatment plan, treatment alternatives and goals were discussed and mutually agreed upon: by patient  SAnnabella9/10/2016, 12:18 PM

## 2016-07-27 ENCOUNTER — Inpatient Hospital Stay (HOSPITAL_COMMUNITY): Payer: Medicare Other | Admitting: Physical Therapy

## 2016-07-27 ENCOUNTER — Inpatient Hospital Stay (HOSPITAL_COMMUNITY): Payer: Medicare Other | Admitting: *Deleted

## 2016-07-27 ENCOUNTER — Inpatient Hospital Stay (HOSPITAL_COMMUNITY): Payer: Medicare Other | Admitting: Occupational Therapy

## 2016-07-27 NOTE — Progress Notes (Signed)
Recreational Therapy Session Note  Patient Details  Name: Ethan Rose MRN: [id number] Date of Birth: 1946-04-19 Today's Date: 07/27/2016  Pain: no c/o Skilled Therapeutic Interventions/Progress Updates: Session focused on activity tolerance, standing tolerance, dynamic standing balance with and without UE support.  Pt propelled w/c to and from therapy using BUE's the Mod I.  Pt stood with RW to kick a ball alternating feet, tapping a ball with 1 UE support and progressing to standing to toss and catch a ball with Min assist with the exception of 1 episode of knee buckling with a controlled sit onto elevated mat.   Repetto,Renaldo Gornick 07/27/2016, 3:44 PM

## 2016-07-27 NOTE — Progress Notes (Signed)
Physical Therapy Session Note  Patient Details  Name: Ethan Rose MRN: [id number] Date of Birth: 11-22-1945  Today's Date: 07/27/2016 PT Individual Time: 1045-1200 and 1300-1330 PT Individual Time Calculation (min): 75 min and 30 min (total 105 min)    Short Term Goals: Week 1:  PT Short Term Goal 1 (Week 1): Pt will perform bed mobility on flat bed, no rails supervision PT Short Term Goal 2 (Week 1): Pt will perform stand pivot bed <> chair and car transfers with mod A with RW PT Short Term Goal 3 (Week 1): Pt will perform gait with RW x 50' with mod A PT Short Term Goal 4 (Week 1): Pt will negotiate 4 steps with bilat UE support and mod A for LE strengthening  Skilled Therapeutic Interventions/Progress Updates:   Tx 1: Pt received supine in bed, denies pain and agreeable to treatment. Supine>sit with S and bedrails, HOB elevated. Seated on EOB pt dons B shoes with AFOs with S using trashcan as stool to elevate feet; increased time. Gait in/out of bathroom with RW and close S. Sit <>stand from toilet modA. Stand pivot transfer w/c >nustep minA. Nustep x10 min BUE/BLE level 1 for strengthening and endurance. Gait x30' in gym with RW and min guard before fatigued. Standing stepups to 3" step with BUE rails and alternating lead LE; modA for RLE stepup d/t glute strength deficits. Standing tapping to 3" step; difficulty to progress LLE onto step without forward trunk flexion reducing effectiveness of R glute. W/c propulsion forward/backward with LEs only for quad/hamstring strengthening. Transferred to bed with RW and minA. Sit >supine with S. Remained supine in bed at end of session, all needs within reach.   Tx 2: Pt received supine in bed, denies pain and agreeable to treatment. Supine>sit modI. Shoes/AFOs donned totalA for time/energy conservation. Squat pivot transfer x3 during session with S w/c <>mat table and bed. Sit <>stand from elevated mat table with minA overall using RW. Standing  balance while performing alternating UE ball hits, BLE ball kicks. Pt performed several BUE ball catches with min guard/minA for balance, however one episode of LEs buckling and required assist to lower to sitting on mat table. Returned to room with BUE w/c propulsion. Remained seated in w/c at end of session, all needs in reach.   Therapy Documentation Precautions:  Precautions Precautions: Fall Restrictions Weight Bearing Restrictions: No   See Function Navigator for Current Functional Status.   Therapy/Group: Individual Therapy  Luberta Mutter 07/27/2016, 12:07 PM

## 2016-07-27 NOTE — Patient Care Conference (Signed)
Inpatient RehabilitationTeam Conference and Plan of Care Update Date: 07/26/2016   Time: 2:10 PM    Patient Name: MANEESH LICATA      Medical Record Number: [id number]  Date of Birth: 01-28-1946 Sex: Male         Room/Bed: 4W01C/4W01C-01 Payor Info: Payor: MEDICARE / Plan: MEDICARE PART A AND B / Product Type: *No Product type* /    Admitting Diagnosis: sepsis  Admit Date/Time:  07/22/2016  1:26 PM Admission Comments: No comment available   Primary Diagnosis:  Debility Principal Problem: Debility  Patient Active Problem List   Diagnosis Date Noted  . Debility 07/22/2016  . Pressure ulcer 07/18/2016  . E coli bacteremia   . Leukocytosis   . Sepsis ([place]) 07/16/2016  . Severe sepsis with septic shock ([place]) 07/16/2016  . Pyelonephritis 07/16/2016  . Left ureteral stone 07/16/2016  . Septic shock ([place]) 07/16/2016  . Cervical myelopathy ([place]) 03/18/2016  . 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 Other malaise and fatigue 05/14/2014  . Health maintenance examination 04/29/2013  . Weakness of both legs 04/29/2013  . Leg cramps 04/29/2013  . Elevated blood pressure reading without diagnosis of hypertension 03/30/2012  . Dizziness 03/30/2012    Expected Discharge Date: Expected Discharge Date: 08/10/16  Team Members Present: Physician leading conference: Dr. Alger Simons Social Worker Present: Lennart Pall, LCSW Nurse Present: Heather Roberts, RN PT Present: Canary Brim, PT;Elizabeth Tygielski, Dustin Folks, PT OT Present: Napoleon Form, OT PPS Coordinator present : Ileana Ladd, PT     Current Status/Progress Goal Weekly Team Focus  Medical   severe deconditioning after sepsis/ureteral stones. premorbid back pain and foot drop  improve stamina  remove foley, nutrition, brace mgt   Bowel/Bladder   foley d/c'd patient voiding bladder scans low < 100cc  Mod I    monitor urinary retention   Swallow/Nutrition/ Hydration             ADL's   Steadying Assist toileting and LB dressing; mod A toilet transfers; mod I grooming from seated level and UB dressing; Supervision bathing  Supervision- mod I overall  Activity tolerance, strengthening, ADL/ IADL re-training   Mobility   S bed mobility, minA transfers and gait x15' with RW  modI bed mobility and transfers, S gait 43' with rollator in home  activity tolerance, LE strengthening, transfer/gait training   Communication             Safety/Cognition/ Behavioral Observations  n/a         Pain   n/a         Skin   back incision healed  no new breakdown   educate on skin care    Rehab Goals Patient on target to meet rehab goals: Yes *See Care Plan and progress notes for long and short-term goals.  Barriers to Discharge: premorbid weakness and foot drop    Possible Resolutions to Barriers:  education regarding orthotics, continued stamina rx    Discharge Planning/Teaching Needs:  Plan to d/c home with intermittent assist of family.  May be able to provide 24/7 assist initially.  Teaching to be scheduled closer to d/c.   Team Discussion:  Sepsis, deconditioned, foot drop (due to prior back surgeries?).  D/c foley today if mobility good.  Working on [place] transfers and amb 35' with min assist.  Sit to stand most difficult and fatigues VERY easily.  Goals to safely use  rollator as he was PTA.    Revisions to Treatment Plan:  None   Continued Need for Acute Rehabilitation Level of Care: The patient requires daily medical management by a physician with specialized training in physical medicine and rehabilitation for the following conditions: Daily direction of a multidisciplinary physical rehabilitation program to ensure safe treatment while eliciting the highest outcome that is of practical value to the patient.: Yes Daily medical management of patient stability for increased activity during  participation in an intensive rehabilitation regime.: Yes Daily analysis of laboratory values and/or radiology reports with any subsequent need for medication adjustment of medical intervention for : Post surgical problems;Neurological problems  Minyon Billiter 07/28/2016, 1:31 PM

## 2016-07-27 NOTE — Progress Notes (Signed)
Occupational Therapy Session Note  Patient Details  Name: Ethan Rose MRN: [id number] Date of Birth: 1946-01-16  Today's Date: 07/27/2016 OT Individual Time: [id number] OT Individual Time Calculation (min): 75 min     Short Term Goals:Week 1:  OT Short Term Goal 1 (Week 1): Complete shower transfer with min assist OT Short Term Goal 2 (Week 1): Dress lower body using AE, prn, with min assist OT Short Term Goal 3 (Week 1): Perform UE HEP with supervision OT Short Term Goal 4 (Week 1): Groom standing supported at sink with steadying assist for 3 minutes  Skilled Therapeutic Interventions/Progress Updates:    Pt seen for OT session focusing on ADL re-training and activity tolerance. Pt sitting up in w/c upon arrival, agreeable to tx session. Pt completed grooming tasks at sink. Attempted to have pt stand to put on shaving cream in prep for shaving task. He required mod A to stand at sink. Pt required B UE support on sink ledge for upright posture, unable to let go with B UEs to complete functional task. Pt fearful LEs would give out without UE support. He tolerated ~1 minute of standing before requiring seated rest break. Therefore, pt completed shaving task from seated position. Attempted to do oral care in standing, verbal and visual cues provided for sit <> stand technique. Leaning into sink ledge, pt able to tolerate standing long enough to gather supplies and apply toothpaste before needing to complete remainder of task in sitting.  He ambulated ~74ft with RW to toilet, requiring assist for controlled descent onto elevated toilet. Pt able to complete 3/3 toileting tasks with steadying assist.   Pt self propelled w/c to therapy gym for UE strengthening/ endurance. He desired to complete SCI FIT UE cycling again. Tolerated 5 minutes on level 3.5 Pt then completed leg lifts and knee extension with #2 ankle weights on B LEs. Educated regarding importance of quality over quantity with exercises  and importance of good form/ technique. Pt returned to room at end of session, ambulated to bed and left in supine with all needs in reach.  Discussed d/c planning and home layout. Pt most limited by decreased standing tolerance and LE weakness impacting his abilities to complete ADLs/ IADLs from standing level.   Therapy Documentation Precautions:  Precautions Precautions: Fall Restrictions Weight Bearing Restrictions: No Pain:   No/ denies pain ADL: ADL ADL Comments: see Functional Assessment Tool  See Function Navigator for Current Functional Status.   Therapy/Group: Individual Therapy  [name], Shanley Furlough C 07/27/2016, 7:06 AM

## 2016-07-27 NOTE — Progress Notes (Signed)
[facility] PHYSICAL MEDICINE & REHABILITATION     PROGRESS NOTE  Subjective/Complaints:  No new issues. Already bathed for day. Voiding well so far. No problems with flow, no pain.  ROS: Denies CP, SOB, N/V/D.  Objective: Vital Signs: Blood pressure 121/75, pulse 75, temperature 98.9 F (37.2 C), temperature source Oral, resp. rate 18, height 5\' 9"  (1.753 m), weight 72.9 kg (160 lb 11.5 oz), SpO2 98 %. No results found. No results for input(s): WBC, HGB, HCT, PLT in the last 72 hours. No results for input(s): NA, K, CL, GLUCOSE, BUN, CREATININE, CALCIUM in the last 72 hours.  Invalid input(s): CO CBG (last 3)  No results for input(s): GLUCAP in the last 72 hours.  Wt Readings from Last 3 Encounters:  07/27/16 72.9 kg (160 lb 11.5 oz)  07/22/16 72.5 kg (159 lb 12.8 oz)  04/13/16 72.2 kg (159 lb 3.2 oz)    Physical Exam:  BP 121/75 (BP Location: Right Arm)   Pulse 75   Temp 98.9 F (37.2 C) (Oral)   Resp 18   Ht 5\' 9"  (1.753 m)   Wt 72.9 kg (160 lb 11.5 oz)   SpO2 98%   BMI 23.73 kg/m  Constitutional: He appears well-developed and well-nourished.   HENT: Normocephalic. Atraumatic. Eyes: Conj and EOM intact.  Cardiovascular: Normal rate, regular rhythm and normal heart sounds.   Respiratory: Effort normal, breath sounds normal.  GI: Soft. Nontender. Genitourinary: Foley out Musculoskeletal: He exhibits no edema or deformity.  Neurological: He is alert and oriented.  Motor 4+/5 proximal to distal in the UE's.  B/l LE: HF 3-/5, KE 3/5, APF 4/5, ADF trace.  Skin: Skin is warm and dry.  Psychiatric: He has a normal mood and affect. His behavior is normal. Judgment and thought content normal.    Assessment/Plan: 1. Functional deficits secondary to debility after multiple medical complications which require 3+ hours per day of interdisciplinary therapy in a comprehensive inpatient rehab setting. Physiatrist is providing close team supervision and 24 hour management of  active medical problems listed below. Physiatrist and rehab team continue to assess barriers to discharge/monitor patient progress toward functional and medical goals.  Function:  Bathing Bathing position   Position: Shower  Bathing parts Body parts bathed by patient: Right arm, Left arm, Chest, Abdomen, Front perineal area, Buttocks, Right upper leg, Left upper leg, Right lower leg, Left lower leg, Back    Bathing assist Assist Level: Supervision or verbal cues, Set up   Set up : To obtain items  Upper Body Dressing/Undressing Upper body dressing   What is the patient wearing?: Pull over shirt/dress     Pull over shirt/dress - Perfomed by patient: Thread/unthread right sleeve, Thread/unthread left sleeve, Put head through opening, Pull shirt over trunk          Upper body assist Assist Level: Set up   Set up : To obtain clothing/put away  Lower Body Dressing/Undressing Lower body dressing   What is the patient wearing?: Pants, Non-skid slipper socks     Pants- Performed by patient: Pull pants up/down Pants- Performed by helper: Thread/unthread right pants leg, Thread/unthread left pants leg Non-skid slipper socks- Performed by patient: Don/doff right sock, Don/doff left sock (Sock aid) Non-skid slipper socks- Performed by helper: Don/doff right sock, Don/doff left sock                  Lower body assist Assist for lower body dressing: Touching or steadying assistance (Pt > 75%)  Toileting Toileting Toileting activity did not occur: No continent bowel/bladder event (foley, no BM) Toileting steps completed by patient: Adjust clothing prior to toileting, Performs perineal hygiene, Adjust clothing after toileting      Toileting assist Assist level: Touching or steadying assistance (Pt.75%)   Transfers Chair/bed transfer   Chair/bed transfer method: Stand pivot Chair/bed transfer assist level: Touching or steadying assistance (Pt > 75%) Chair/bed transfer  assistive device: Armrests, Medical sales representative     Max distance: 35 Assist level: Touching or steadying assistance (Pt > 75%)   Wheelchair   Type: Manual Max wheelchair distance: 150 Assist Level: Supervision or verbal cues  Cognition Comprehension Comprehension assist level: Understands complex 90% of the time/cues 10% of the time  Expression Expression assist level: Expresses complex ideas: With extra time/assistive device  Social Interaction Social Interaction assist level: Interacts appropriately with others - No medications needed.  Problem Solving Problem solving assist level: Solves complex 90% of the time/cues < 10% of the time  Memory Memory assist level: Recognizes or recalls 90% of the time/requires cueing < 10% of the time    Medical Problem List and Plan: 1.  Mobility and functional deficits secondary to debility after multiple medical complications  -continue CIR therapies  -stamina improving 2.  DVT Prophylaxis/Anticoagulation: Pharmaceutical: Lovenox 3. Pain Management: tylenol. Pain controlled.  4. Mood: team to provide ego support as necessary. Pt appears motivated. 5. Neuropsych: This patient is capable of making decisions on his own behalf. 6. Skin/Wound Care: encourage nutrition.   -local skin care as necessary 7. Fluids/Electrolytes/Nutrition:  BMP within acceptable range on 9/9 8. E coli bacteremia/UTI: Continue to monitor for temps/symptoms rather that WBC per ID. Bactrim for 10 days   -foley out--voiding well so far. Need to check PVR's  -stone retrieval in 7-10 days?---follow up with urology 9. Hypokalemia:resolved 10. Prediabetes: dietary education for now 11. Protein calorie malnutrition: improving intake 12. 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 He does have a history of cervical stenosis, myelopathy,  and subsequent surgery as wel.  -AFO/shoewear functional  LOS (Days) 5 A FACE TO FACE EVALUATION WAS PERFORMED  Airianna Kreischer T 07/27/2016 8:49 AM

## 2016-07-28 ENCOUNTER — Inpatient Hospital Stay (HOSPITAL_COMMUNITY): Payer: Medicare Other | Admitting: Physical Therapy

## 2016-07-28 ENCOUNTER — Inpatient Hospital Stay (HOSPITAL_COMMUNITY): Payer: Medicare Other | Admitting: Occupational Therapy

## 2016-07-28 LAB — CBC WITH DIFFERENTIAL/PLATELET
Basophils Absolute: 0.1 10*3/uL (ref 0.0–0.1)
Basophils Relative: 1 %
EOS ABS: 0.2 10*3/uL (ref 0.0–0.7)
EOS PCT: 2 %
HCT: 35.1 % — ABNORMAL LOW (ref 39.0–52.0)
Hemoglobin: 11.4 g/dL — ABNORMAL LOW (ref 13.0–17.0)
LYMPHS ABS: 1.5 10*3/uL (ref 0.7–4.0)
LYMPHS PCT: 15 %
MCH: 29.2 pg (ref 26.0–34.0)
MCHC: 32.5 g/dL (ref 30.0–36.0)
MCV: 90 fL (ref 78.0–100.0)
MONOS PCT: 11 %
Monocytes Absolute: 1.2 10*3/uL — ABNORMAL HIGH (ref 0.1–1.0)
Neutro Abs: 7.3 10*3/uL (ref 1.7–7.7)
Neutrophils Relative %: 71 %
PLATELETS: 598 10*3/uL — AB (ref 150–400)
RBC: 3.9 MIL/uL — AB (ref 4.22–5.81)
RDW: 14.6 % (ref 11.5–15.5)
WBC: 10.1 10*3/uL (ref 4.0–10.5)

## 2016-07-28 LAB — BASIC METABOLIC PANEL
Anion gap: 7 (ref 5–15)
BUN: 21 mg/dL — AB (ref 6–20)
CHLORIDE: 101 mmol/L (ref 101–111)
CO2: 26 mmol/L (ref 22–32)
CREATININE: 0.68 mg/dL (ref 0.61–1.24)
Calcium: 8.9 mg/dL (ref 8.9–10.3)
GFR calc Af Amer: >60 mL/min (ref >60)
GFR calc non Af Amer: >60 mL/min (ref >60)
GLUCOSE: 128 mg/dL — AB (ref 65–99)
POTASSIUM: 4.7 mmol/L (ref 3.5–5.1)
SODIUM: 134 mmol/L — AB (ref 135–145)

## 2016-07-28 MED ORDER — VITAMIN D (ERGOCALCIFEROL) 1.25 MG (50000 UNIT) PO CAPS
50000.0000 [IU] | ORAL_CAPSULE | ORAL | Status: DC
  Administered 2016-07-28 – 2016-08-04 (×2): 50000 [IU] via ORAL
  Filled 2016-07-28 (×2): qty 1

## 2016-07-28 NOTE — Progress Notes (Signed)
Social Work Patient ID: Ethan Rose, male   DOB: 11-16-1945, 70 y.o.   MRN: [id number]   Have reviewed team conf with pt who is aware and agreeable with targeted d/c date of 9/27 with mod ind to min assist goals.  Will also follow up with daughter.  Marijose Curington, LCSW

## 2016-07-28 NOTE — Progress Notes (Signed)
Occupational Therapy Session Note  Patient Details  Name: Ethan Rose MRN: [id number] Date of Birth: [birth year]  Today's Date: 07/28/2016 OT Individual Time: [id number] and 1430-1500 OT Individual Time Calculation (min): 75 min and 30 min    Short Term Goals:Week 1:  OT Short Term Goal 1 (Week 1): Complete shower transfer with min assist OT Short Term Goal 2 (Week 1): Dress lower body using AE, prn, with min assist OT Short Term Goal 3 (Week 1): Perform UE HEP with supervision OT Short Term Goal 4 (Week 1): Groom standing supported at sink with steadying assist for 3 minutes  Skilled Therapeutic Interventions/Progress Updates:    Session One: Pt seen for OT ADL bathing/dressing session. Pt sitting up in w/c upon arrival, agreeable to tx session. With encouragement, pt willing to attempt ambulation to toilet, ~12 ft. He required mod-max A to stand to RW and min A ambulation. Pt fatigued quickly and required total A to position hips over toilet before LEs gave way to fatigue to prevent fall. Seated rest break required before completing clothing management. Discussed use of lateral leans for clothing management in order to conserve energy and for safety.  He bathed seated on tub bench with supervision, lateral leans for buttock hygiene. Squat pivot completed out of shower and pt dressed seated in w/c using sock aid to assist.  Grooming completed mod I from seated position during rest breaks.  Pt then completed UE strengthening utilizing medium grade theraband. Pt able to recall exercises taught in previous sessions as well introduced to new exercises. Completed x10 reps of bicep curls, diagonals up/down, chest expansion. VCs and demonstration provided for proper form and technique. Pt left sitting in w/c at end of session, all needs in reach. Discussed with pt toileting options for night time as pt stated he was up throughout the night to urinate after having foley removed. Discussed clothing  options for easier access as well as potenal for condom cath if needed if he cont to not be able to sleep.   Session Two: Pt seen for OT session focusing on functional standing balance and endurance. Pt received in therapy gym with handoff from PT. In ADL kitchen, pt stood at RW to retrieve and replace items from overhead cabinet. Pt required one UE support on RW and standing rest breaks, tolerated ~1 minute before requiring seated rest break. Min-mod A to stand with VCs for hand placement on RW. Discussed with pt energy conservation at home as well as ways to incorporate exercise into everyday tasks. Pt self propelled w/c back to room for UE strengthening. He ambulated ~36ft to bed, returned to supine and left with all needs in reach.   Therapy Documentation Precautions:  Precautions Precautions: Fall Restrictions Weight Bearing Restrictions: No Pain:   No/ denies pain ADL: ADL ADL Comments: see Functional Assessment Tool  See Function Navigator for Current Functional Status.   Therapy/Group: Individual Therapy  [name], Garron Eline C 07/28/2016, 6:57 AM

## 2016-07-28 NOTE — Progress Notes (Signed)
[place] PHYSICAL MEDICINE & REHABILITATION     PROGRESS NOTE  Subjective/Complaints:  Emptying bladder. Sleeping well. Still tired at the end of therapies.   ROS: Denies CP, SOB, N/V/D.  Objective: Vital Signs: Blood pressure 136/63, pulse 65, temperature 98.6 F (37 C), temperature source Oral, resp. rate 17, height 5\' 9"  (1.753 m), weight 72.9 kg (160 lb 11.5 oz), SpO2 96 %. No results found.  Recent Labs  07/28/16 0534  WBC 10.1  HGB 11.4*  HCT 35.1*  PLT 598*    Recent Labs  07/28/16 0534  NA 134*  K 4.7  CL 101  GLUCOSE 128*  BUN 21*  CREATININE 0.68  CALCIUM 8.9   CBG (last 3)  No results for input(s): GLUCAP in the last 72 hours.  Wt Readings from Last 3 Encounters:  07/27/16 72.9 kg (160 lb 11.5 oz)  07/22/16 72.5 kg (159 lb 12.8 oz)  04/13/16 72.2 kg (159 lb 3.2 oz)    Physical Exam:  BP 136/63 (BP Location: Right Arm)   Pulse 65   Temp 98.6 F (37 C) (Oral)   Resp 17   Ht 5\' 9"  (1.753 m)   Wt 72.9 kg (160 lb 11.5 oz)   SpO2 96%   BMI 23.73 kg/m  Constitutional: He appears well-developed and well-nourished.   HENT: Normocephalic. Atraumatic. Eyes: Conj and EOM intact.  Cardiovascular: Normal rate, regular rhythm and normal heart sounds.   Respiratory: Effort normal, breath sounds normal.  GI: Soft. Nontender. Genitourinary: Foley out Musculoskeletal: He exhibits no edema or deformity.  Neurological: He is alert and oriented.  Motor 4+/5 proximal to distal in the UE's.  B/l LE: HF 3-/5, KE 3/5, APF 4/5, ADF trace.  Skin: Skin is warm and dry.  Psychiatric: He has a normal mood and affect. His behavior is normal. Judgment and thought content normal.    Assessment/Plan: 1. Functional deficits secondary to debility after multiple medical complications which require 3+ hours per day of interdisciplinary therapy in a comprehensive inpatient rehab setting. Physiatrist is providing close team supervision and 24 hour management of active  medical problems listed below. Physiatrist and rehab team continue to assess barriers to discharge/monitor patient progress toward functional and medical goals.  Function:  Bathing Bathing position   Position: Shower  Bathing parts Body parts bathed by patient: Right arm, Left arm, Chest, Abdomen, Front perineal area, Buttocks, Right upper leg, Left upper leg, Right lower leg, Left lower leg, Back    Bathing assist Assist Level: Supervision or verbal cues, Set up   Set up : To obtain items  Upper Body Dressing/Undressing Upper body dressing   What is the patient wearing?: Pull over shirt/dress     Pull over shirt/dress - Perfomed by patient: Thread/unthread right sleeve, Thread/unthread left sleeve, Put head through opening, Pull shirt over trunk          Upper body assist Assist Level: Set up   Set up : To obtain clothing/put away  Lower Body Dressing/Undressing Lower body dressing   What is the patient wearing?: Pants, Non-skid slipper socks     Pants- Performed by patient: Pull pants up/down Pants- Performed by helper: Thread/unthread right pants leg, Thread/unthread left pants leg Non-skid slipper socks- Performed by patient: Don/doff right sock, Don/doff left sock (Sock aid) Non-skid slipper socks- Performed by helper: Don/doff right sock, Don/doff left sock                  Lower body assist Assist for  lower body dressing: Touching or steadying assistance (Pt > 75%)      Toileting Toileting Toileting activity did not occur: No continent bowel/bladder event (foley, no BM) Toileting steps completed by patient: Adjust clothing prior to toileting, Performs perineal hygiene, Adjust clothing after toileting      Toileting assist Assist level: Supervision or verbal cues   Transfers Chair/bed transfer   Chair/bed transfer method: Stand pivot Chair/bed transfer assist level: Touching or steadying assistance (Pt > 75%) Chair/bed transfer assistive device:  Armrests, Medical sales representative     Max distance: 30 Assist level: Touching or steadying assistance (Pt > 75%)   Wheelchair   Type: Manual Max wheelchair distance: 150 Assist Level: Supervision or verbal cues  Cognition Comprehension Comprehension assist level: Understands complex 90% of the time/cues 10% of the time  Expression Expression assist level: Expresses complex ideas: With extra time/assistive device  Social Interaction Social Interaction assist level: Interacts appropriately with others - No medications needed.  Problem Solving Problem solving assist level: Solves complex 90% of the time/cues < 10% of the time  Memory Memory assist level: Recognizes or recalls 90% of the time/requires cueing < 10% of the time    Medical Problem List and Plan: 1.  Mobility and functional deficits secondary to debility after multiple medical complications  -continue CIR therapies  -stamina improving 2.  DVT Prophylaxis/Anticoagulation: Pharmaceutical: Lovenox 3. Pain Management: tylenol. Pain controlled.  4. Mood: team to provide ego support as necessary. Pt appears motivated. 5. Neuropsych: This patient is capable of making decisions on his own behalf. 6. Skin/Wound Care: encourage nutrition.   -local skin care as necessary 7. Fluids/Electrolytes/Nutrition:  BMP within acceptable range on 9/9 8. E coli bacteremia/UTI: Continue to monitor for temps/symptoms rather that WBC per ID. Bactrim for 10 days   -foley out--voiding well so far. pvr's low  -stone retrieval in 7-10 days?---follow up with urology 9. Hypokalemia:resolved 10. Prediabetes: dietary education for now 11. Protein calorie malnutrition: improving intake 12. 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 He does have a history of cervical stenosis, myelopathy, and subsequent surgery as  wel.  -AFO/shoewear functional  LOS (Days) 6 A FACE TO FACE EVALUATION WAS PERFORMED  SWARTZ,ZACHARY T 07/28/2016 8:34 AM

## 2016-07-28 NOTE — Progress Notes (Signed)
Physical Therapy Session Note  Patient Details  Name: Ethan Rose MRN: [id number] Date of Birth: 1946/05/15  Today's Date: 07/28/2016 PT Individual Time: 1000-1100 and 1400-1430 PT Individual Time Calculation (min): 60 min and 30 min (total 90 min)    Short Term Goals: Week 1:  PT Short Term Goal 1 (Week 1): Pt will perform bed mobility on flat bed, no rails supervision PT Short Term Goal 2 (Week 1): Pt will perform stand pivot bed <> chair and car transfers with mod A with RW PT Short Term Goal 3 (Week 1): Pt will perform gait with RW x 50' with mod A PT Short Term Goal 4 (Week 1): Pt will negotiate 4 steps with bilat UE support and mod A for LE strengthening  Skilled Therapeutic Interventions/Progress Updates:   Tx 1: Pt received seated in w/c, denies pain and agreeable to treatment. W/c propulsion to gym BUE x170' with S. Sit <>stand from elevated mat table with therapist sitting in front of pt blocking knees, and pts BUEs reaching for arms of therapist's chair to reduce pushing through LEs and focus on concentric/eccentric glute contraction. Sitting forward reaching for arm rests and returning to midline for trunk extensor strengthening. Tall kneeling with +2A to get on/off mat table, and min/modA for static standing in tall kneeling with cues for trunk extension, several rest breaks on B elbows. Returned to room totalA for energy conservation. Squat pivot transfer w/c >bed with S. Remained supine in bed at end of session, all needs in reach. Bed alarm d/c from safety plan d/t appropriate safety awareness.   Tx 2: Pt received supine in bed, denies pain and agreeable to treatment. Bed mobility modI with bed features. Transfer bed>w/c<>bsc over toilet with squat pivot S and min cues for w/c setup. Performed lateral leans to don/doff pants once seated on BSC. Squat pivot transfer w/c <>nustep with S. Performed nustep x8 min with BUE/BLE level 1 with average 45 steps/min for BLE strengthening  and aerobic endurance. Handoff to OT for next session in gym.   Therapy Documentation Precautions:  Precautions Precautions: Fall Restrictions Weight Bearing Restrictions: No Pain: Pain Assessment Pain Assessment: No/denies pain   See Function Navigator for Current Functional Status.   Therapy/Group: Individual Therapy  Luberta Mutter 07/28/2016, 11:00 AM

## 2016-07-29 ENCOUNTER — Inpatient Hospital Stay (HOSPITAL_COMMUNITY): Payer: Medicare Other | Admitting: Physical Therapy

## 2016-07-29 ENCOUNTER — Inpatient Hospital Stay (HOSPITAL_COMMUNITY): Payer: Medicare Other | Admitting: Occupational Therapy

## 2016-07-29 DIAGNOSIS — M7022 Olecranon bursitis, left elbow: Secondary | ICD-10-CM

## 2016-07-29 NOTE — Plan of Care (Signed)
Problem: RH Bed to Chair Transfers Goal: LTG Patient will perform bed/chair transfers w/assist (PT) LTG: Patient will perform bed/chair transfers with assistance, with/without cues (PT).  Upgraded d/t progress   Problem: RH Stairs Goal: LTG Patient will ambulate up and down stairs w/assist (PT) LTG: Patient will ambulate up and down # of stairs with assistance (PT)  D/c; not appropriate for pt to function in home environment

## 2016-07-29 NOTE — Progress Notes (Signed)
Occupational Therapy Weekly Progress Note  Patient Details  Name: Ethan Rose MRN: [id number] Date of Birth: 01-Mar-1946  Beginning of progress report period: July 23, 2016 End of progress report period: July 29, 2016  Today's Date: 07/29/2016 OT Individual Time: 0845-1000 OT Individual Time Calculation (min): 75 min    Patient has met 2 of 4 short term goals.  Pt making slow but steady progress towards OT goals. Pt is not able to consistently complete shower transfers with min A, as he can require up to mod when fatigued. He is able to complete squat pivot transfers min A- supervision, however, unless going home at w/c level there transfers are not functional within the pt's home. Pt with very poor activity tolerance, able to stand at most ~1 minute with at least one UE support before requiring seated rest break due to LEs giving way. Pt remains very motivated to return to PLOF at supervision- mod I level.   Patient continues to demonstrate the following deficits: generalized muscle weakness and poor activity tolerance and therefore will continue to benefit from skilled OT intervention to enhance overall performance with BADL and iADL.  Patient progressing toward long term goals..  Continue plan of care.   OT Short Term Goals Week 1:  OT Short Term Goal 1 (Week 1): Complete shower transfer with min assist OT Short Term Goal 1 - Progress (Week 1): Progressing toward goal OT Short Term Goal 2 (Week 1): Dress lower body using AE, prn, with min assist OT Short Term Goal 2 - Progress (Week 1): Met OT Short Term Goal 3 (Week 1): Perform UE HEP with supervision OT Short Term Goal 3 - Progress (Week 1): Met OT Short Term Goal 4 (Week 1): Groom standing supported at sink with steadying assist for 3 minutes OT Short Term Goal 4 - Progress (Week 1): Not met Week 2:  OT Short Term Goal 1 (Week 2): Pt will complete one grooming task standing at sink with min A in order to increase  functional activity tolerance OT Short Term Goal 2 (Week 2): Pt will ambulate from EOB to toilet in room with close supervision in order to increase independence with ADLs OT Short Term Goal 3 (Week 2): Pt will complete toilet transfer and 3/3 toileting tasks without need for seated rest break.  OT Short Term Goal 4 (Week 2): Pt will ambulate within room to gather clothing in prep for showering task with min A   Skilled Therapeutic Interventions/Progress Updates:    Pt seen for OT ADL bathing/dressing session. Pt sitting up in w/c upon arrival, agreeable to tx session and desiring to shower. He gathered items from w/c level and then ambulated ~47f from bathroom door to shower bench and min A, requiring assist for controlled descent to bench. He bathed with distant supervision from seated position. Mod A squat pivot to w/c completed out of shower, requiring increased assist due to fatigue and placement of grab bars needed to get more leverage to power up. He dressed seated in w/c, using AE. He required mod A to stand and steadying assist while he pulled up pants. Discussed at length d/c planning and planning for fatigue as well as for increased independence. Demonstrated lateral leans for clothing management as possibility in order to decrease number of sit <> stands when fatigue and to increase independence with ADLs. Pt self propelled w/c to/from therapy gym for UE strengthening/ endurance. In gym, pt completed UE strengthening exercises. Completed x1 set of chest  press, overhead press, and bicep curls using #3 dowel rod. Second set completed using #4 dowel rod. Discussed importance of maintaining proper form and technique with exercises and ways to judge if exercise weight is too light or heavy. Pt left in hall way with hand off to PT.   Therapy Documentation Precautions:  Precautions Precautions: Fall Restrictions Weight Bearing Restrictions: No Pain: Pain Assessment Pain Assessment:  No/denies pain ADL: ADL ADL Comments: see Functional Assessment Tool  See Function Navigator for Current Functional Status.   Therapy/Group: Individual Therapy  [name], Velina Drollinger C 07/29/2016, 6:12 AM

## 2016-07-29 NOTE — Progress Notes (Addendum)
Physical Therapy Weekly Progress Note  Patient Details  Name: Ethan Rose MRN: [id number] Date of Birth: October 24, 1946  Beginning of progress report period: July 23, 2016 End of progress report period: July 29, 2016  Today's Date: 07/29/2016 PT Individual Time: 1000-1100 and 1400-1500 PT Individual Time Calculation (min): 60 min and 60 min (total 120 min)    Patient has met 3 of 4 short term goals.  Pt currently requires S for bed mobility, min/modA for sit <>stand d/t LE strength deficits, and min guard for gait with RW. Pt with significant LE strength and endurance deficits and LEs buckle when fatigued or attempting to return to sitting position requiring assist to prevent falls and safely navigate using RW. Pt is motivated to return to prior level of function and participates fully in all sessions.  Patient continues to demonstrate the following deficits: impaired activity tolerance, balance, postural control, ability to compensate for deficits and functional use of  right lower extremity and left lower extremity and therefore will continue to benefit from skilled PT intervention to enhance overall performance with bed mobility, transfers, gait, home and community access.   Patient progressing toward long term goals..  Plan of care revisions: Stair goal discharged as it is not applicable to function in home setting. Transfer goal upgraded to modI d/t limited family support during the day..  PT Short Term Goals Week 1:  PT Short Term Goal 1 (Week 1): Pt will perform bed mobility on flat bed, no rails supervision PT Short Term Goal 1 - Progress (Week 1): Met PT Short Term Goal 2 (Week 1): Pt will perform stand pivot bed <> chair and car transfers with mod A with RW PT Short Term Goal 2 - Progress (Week 1): Met PT Short Term Goal 3 (Week 1): Pt will perform gait with RW x 50' with mod A PT Short Term Goal 3 - Progress (Week 1): Met PT Short Term Goal 4 (Week 1): Pt will negotiate 4  steps with bilat UE support and mod A for LE strengthening PT Short Term Goal 4 - Progress (Week 1): Discontinued (comment) Week 2:  PT Short Term Goal 1 (Week 2): Pt will ambulate 76' with rollator and min guard PT Short Term Goal 2 (Week 2): Pt will perform stand pivot transfers with min guard PT Short Term Goal 3 (Week 2): Pt will perform bed mobility modI PT Short Term Goal 4 (Week 2): Pt will perform gait up/down ramp with rollator and minA   Skilled Therapeutic Interventions/Progress Updates:   Tx 1: Pt received seated in w/c with handoff from OT; denies pain and agreeable to treatment. Session with focus on LE, core strengthening in fundamental positioning including prone, supine, sit <>stand, and quadruped. Performed trunk extensions, scapular retractions in prone, 2x10 reps. Quadruped with alternating LE marching and LE extensions with min guard for balance and UEs fatigue quickly. Sit <>stand and standing modified dead lift for glute/trunk extensor strengthening. Supine rotation crunches for carryover into bed mobility. Returned to room and remained seated in w/c at end of session, all needs in reach.  Tx 2: Pt received supine in bed, no c/o pain and agreeable to treatment. Shoes/AFOs donned totalA for energy conservation. W/c propulsion indoors/outdoors with BUE for strengthening and aerobic endurance. Transfer w/c <>chair at picnic table with squat pivot and S. Educated pt on safety at home, discussed LE strength deficits related to driving and pt verbalizes understanding. Transfer w/c <>nustep with S. Nustep level 3 with BLE  x10 min. Returned to room in w/c totalA for energy conservation. Remained seated in w/c at end of session, all needs in reach.   Therapy Documentation Precautions:  Precautions Precautions: Fall Restrictions Weight Bearing Restrictions: No  See Function Navigator for Current Functional Status.  Therapy/Group: Individual Therapy  Luberta Mutter 07/29/2016, 2:48 PM

## 2016-07-29 NOTE — Progress Notes (Addendum)
[place] PHYSICAL MEDICINE & REHABILITATION     PROGRESS NOTE  Subjective/Complaints:  Complains of worsening swelling left elbow. Has had prior to arrival. Painful at times. Wearing an elbow pad to protect   ROS: Denies CP, SOB, N/V/D.  Objective: Vital Signs: Blood pressure 133/74, pulse 62, temperature 98.2 F (36.8 C), temperature source Oral, resp. rate 18, height 5\' 9"  (1.753 m), weight 72.9 kg (160 lb 11.5 oz), SpO2 97 %. No results found.  Recent Labs  07/28/16 0534  WBC 10.1  HGB 11.4*  HCT 35.1*  PLT 598*    Recent Labs  07/28/16 0534  NA 134*  K 4.7  CL 101  GLUCOSE 128*  BUN 21*  CREATININE 0.68  CALCIUM 8.9   CBG (last 3)  No results for input(s): GLUCAP in the last 72 hours.  Wt Readings from Last 3 Encounters:  07/27/16 72.9 kg (160 lb 11.5 oz)  07/22/16 72.5 kg (159 lb 12.8 oz)  04/13/16 72.2 kg (159 lb 3.2 oz)    Physical Exam:  BP 133/74 (BP Location: Right Arm)   Pulse 62   Temp 98.2 F (36.8 C) (Oral)   Resp 18   Ht 5\' 9"  (1.753 m)   Wt 72.9 kg (160 lb 11.5 oz)   SpO2 97%   BMI 23.73 kg/m  Constitutional: He appears well-developed and well-nourished.   HENT: Normocephalic. Atraumatic. Eyes: Conj and EOM intact.  Cardiovascular: Normal rate, regular rhythm and normal heart sounds.   Respiratory: Effort normal, breath sounds normal.  GI: Soft. Nontender. Genitourinary: Foley out Musculoskeletal: He exhibits no edema or deformity.  -golf ball sized fluid collection left olecranon Neurological: He is alert and oriented.  Motor 4+/5 proximal to distal in the UE's.  B/l LE: HF 3-/5, KE 3/5, APF 4/5, ADF trace.  Skin: Skin is warm and dry.  Psychiatric: He has a normal mood and affect. His behavior is normal. Judgment and thought content normal.    Assessment/Plan: 1. Functional deficits secondary to debility after multiple medical complications which require 3+ hours per day of interdisciplinary therapy in a comprehensive  inpatient rehab setting. Physiatrist is providing close team supervision and 24 hour management of active medical problems listed below. Physiatrist and rehab team continue to assess barriers to discharge/monitor patient progress toward functional and medical goals.  Function:  Bathing Bathing position   Position: Shower  Bathing parts Body parts bathed by patient: Right arm, Left arm, Chest, Abdomen, Front perineal area, Buttocks, Right upper leg, Left upper leg, Right lower leg, Left lower leg, Back    Bathing assist Assist Level: Supervision or verbal cues, Set up   Set up : To obtain items  Upper Body Dressing/Undressing Upper body dressing   What is the patient wearing?: Pull over shirt/dress     Pull over shirt/dress - Perfomed by patient: Thread/unthread right sleeve, Thread/unthread left sleeve, Put head through opening, Pull shirt over trunk          Upper body assist Assist Level: Set up   Set up : To obtain clothing/put away  Lower Body Dressing/Undressing Lower body dressing   What is the patient wearing?: Pants, Non-skid slipper socks     Pants- Performed by patient: Thread/unthread right pants leg, Thread/unthread left pants leg, Pull pants up/down Pants- Performed by helper: Thread/unthread right pants leg, Thread/unthread left pants leg Non-skid slipper socks- Performed by patient: Don/doff right sock, Don/doff left sock (Sock aid) Non-skid slipper socks- Performed by helper: Don/doff right sock, Don/doff  left sock                  Lower body assist Assist for lower body dressing: Touching or steadying assistance (Pt > 75%)      Toileting Toileting Toileting activity did not occur: No continent bowel/bladder event (foley, no BM) Toileting steps completed by patient: Adjust clothing prior to toileting, Performs perineal hygiene, Adjust clothing after toileting      Toileting assist Assist level: Supervision or verbal cues   Transfers Chair/bed  transfer   Chair/bed transfer method: Squat pivot Chair/bed transfer assist level: Supervision or verbal cues Chair/bed transfer assistive device: Armrests     Locomotion Ambulation     Max distance: 50 Assist level: Touching or steadying assistance (Pt > 75%)   Wheelchair   Type: Manual Max wheelchair distance: 150 Assist Level: Supervision or verbal cues  Cognition Comprehension Comprehension assist level: Understands complex 90% of the time/cues 10% of the time  Expression Expression assist level: Expresses complex ideas: With extra time/assistive device  Social Interaction Social Interaction assist level: Interacts appropriately with others - No medications needed.  Problem Solving Problem solving assist level: Solves complex 90% of the time/cues < 10% of the time  Memory Memory assist level: Recognizes or recalls 90% of the time/requires cueing < 10% of the time    Medical Problem List and Plan: 1.  Mobility and functional deficits secondary to debility after multiple medical complications  -continue CIR therapies    2.  DVT Prophylaxis/Anticoagulation: Pharmaceutical: Lovenox 3. Pain Management: tylenol. Pain controlled.  4. Mood: team to provide ego support as necessary. Pt appears motivated. 5. Neuropsych: This patient is capable of making decisions on his own behalf. 6. Skin/Wound Care: encourage nutrition.   -local skin care as necessary 7. Fluids/Electrolytes/Nutrition:  BMP within acceptable range on 9/9--recheck next week 8. E coli bacteremia/UTI: Continue to monitor for temps/symptoms rather that WBC per ID. Bactrim for 10 days   -foley out--voiding well so far. pvr's low  -stone retrieval---after discharge-follow up with urology next week re: plan 9. Hypokalemia:resolved 10. Prediabetes: dietary education for now 11. Protein calorie malnutrition: improving intake 12. 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 He does have a history of cervical stenosis, myelopathy, and subsequent surgery as wel.  -AFO/shoewear functional 13. Left olecranon bursitis---  -after preparation of the skin with betadine, I aspirated 6cc of serosanguinous fluid from the left olecranon bursa. The area was then cleaned and dressed. Pt tolerated without incident.  LOS (Days) 7 A FACE TO FACE EVALUATION WAS PERFORMED  Gamaliel Charney T 07/29/2016 8:31 AM

## 2016-07-30 ENCOUNTER — Inpatient Hospital Stay (HOSPITAL_COMMUNITY): Payer: Medicare Other | Admitting: Occupational Therapy

## 2016-07-30 NOTE — Progress Notes (Signed)
Occupational Therapy Session Note  Patient Details  Name: Ethan Rose MRN: [id number] Date of Birth: February 04, 1946  Today's Date: 07/30/2016 OT Individual Time: 0931-1216 OT Individual Time Calculation (min): 42 min     Short Term Goals: Week 1:  OT Short Term Goal 1 (Week 1): Complete shower transfer with min assist OT Short Term Goal 1 - Progress (Week 1): Progressing toward goal OT Short Term Goal 2 (Week 1): Dress lower body using AE, prn, with min assist OT Short Term Goal 2 - Progress (Week 1): Met OT Short Term Goal 3 (Week 1): Perform UE HEP with supervision OT Short Term Goal 3 - Progress (Week 1): Met OT Short Term Goal 4 (Week 1): Groom standing supported at sink with steadying assist for 3 minutes OT Short Term Goal 4 - Progress (Week 1): Not met Week 2:  OT Short Term Goal 1 (Week 2): Pt will complete one grooming task standing at sink with min A in order to increase functional activity tolerance OT Short Term Goal 2 (Week 2): Pt will ambulate from EOB to toilet in room with close supervision in order to increase independence with ADLs OT Short Term Goal 3 (Week 2): Pt will complete toilet transfer and 3/3 toileting tasks without need for seated rest break.  OT Short Term Goal 4 (Week 2): Pt will ambulate within room to gather clothing in prep for showering task with min A  Skilled Therapeutic Interventions/Progress Updates:    Upon entering the room, pt supine in bed. Pt with no c/o pain this session. Pt performed supine >sit with supervision and use of bedrails. Pt performed squat pivot transfer from bed >wheelchair with min verbal cues for hand placement and safety. Pt requesting to shave at sink while seated in wheelchair. Pt dressed at sink with sit <>stand from wheelchair. Pt needing mod A for lifting to come into standing position. OT provided min A for balance and pt pulling pants over B hips. Pt remained in wheelchair for theraband exercises with review of current HEP  for B UE strengthening. Pt utilizing paper handout with min verbal cues from therapist for proper technique as he performed 10 reps of each listed exercise. Pt remained in chair at end of session with call bell and all needed items within reach upon exiting the room.   Therapy Documentation Precautions:  Precautions Precautions: Fall Restrictions Weight Bearing Restrictions: No    ADL: ADL ADL Comments: see Functional Assessment Tool  See Function Navigator for Current Functional Status.   Therapy/Group: Individual Therapy  [name] 07/30/2016, 9:33 AM

## 2016-07-30 NOTE — Progress Notes (Signed)
[facility] PHYSICAL MEDICINE & REHABILITATION     PROGRESS NOTE  Subjective/Complaints:  No elbow issues today.  ROS: Denies CP, SOB, N/V/D.  Objective: Vital Signs: Blood pressure 130/76, pulse 65, temperature 97.8 F (36.6 C), temperature source Oral, resp. rate 18, height 5\' 9"  (1.753 m), weight 72.9 kg (160 lb 11.5 oz), SpO2 97 %. No results found.  Recent Labs  07/28/16 0534  WBC 10.1  HGB 11.4*  HCT 35.1*  PLT 598*    Recent Labs  07/28/16 0534  NA 134*  K 4.7  CL 101  GLUCOSE 128*  BUN 21*  CREATININE 0.68  CALCIUM 8.9   CBG (last 3)  No results for input(s): GLUCAP in the last 72 hours.  Wt Readings from Last 3 Encounters:  07/27/16 72.9 kg (160 lb 11.5 oz)  07/22/16 72.5 kg (159 lb 12.8 oz)  04/13/16 72.2 kg (159 lb 3.2 oz)    Physical Exam:  BP 130/76 (BP Location: Right Arm)   Pulse 65   Temp 97.8 F (36.6 C) (Oral)   Resp 18   Ht 5\' 9"  (1.753 m)   Wt 72.9 kg (160 lb 11.5 oz)   SpO2 97%   BMI 23.73 kg/m  Constitutional: He appears well-developed and well-nourished.   HENT: Normocephalic. Atraumatic. Eyes: Conj and EOM intact.  Cardiovascular: Normal rate, regular rhythm and normal heart sounds.   Respiratory: Effort normal, breath sounds normal.  GI: Soft. Nontender. Genitourinary: Foley out Musculoskeletal: He exhibits no edema or deformity.  -golf ball sized fluid collection left olecranon Neurological: He is alert and oriented.  Motor 4+/5 proximal to distal in the UE's.  B/l LE: HF 3-/5, KE 3/5, APF 4/5, ADF trace.  Skin: Skin is warm and dry.  Psychiatric: He has a normal mood and affect. His behavior is normal. Judgment and thought content normal.    Assessment/Plan: 1. Functional deficits secondary to debility after multiple medical complications which require 3+ hours per day of interdisciplinary therapy in a comprehensive inpatient rehab setting. Physiatrist is providing close team supervision and 24 hour management of active  medical problems listed below. Physiatrist and rehab team continue to assess barriers to discharge/monitor patient progress toward functional and medical goals.  Function:  Bathing Bathing position   Position: Shower  Bathing parts Body parts bathed by patient: Right arm, Left arm, Chest, Abdomen, Front perineal area, Buttocks, Right upper leg, Left upper leg, Right lower leg, Left lower leg, Back    Bathing assist Assist Level: Supervision or verbal cues   Set up : To obtain items  Upper Body Dressing/Undressing Upper body dressing   What is the patient wearing?: Pull over shirt/dress     Pull over shirt/dress - Perfomed by patient: Thread/unthread right sleeve, Thread/unthread left sleeve, Put head through opening, Pull shirt over trunk          Upper body assist Assist Level: More than reasonable time   Set up : To obtain clothing/put away  Lower Body Dressing/Undressing Lower body dressing   What is the patient wearing?: Pants     Pants- Performed by patient: Thread/unthread right pants leg, Thread/unthread left pants leg, Pull pants up/down Pants- Performed by helper: Thread/unthread right pants leg, Thread/unthread left pants leg Non-skid slipper socks- Performed by patient: Don/doff right sock, Don/doff left sock (Sock aid) Non-skid slipper socks- Performed by helper: Don/doff right sock, Don/doff left sock     Shoes - Performed by patient: Don/doff right shoe, Don/doff left shoe, Fasten right, [employer]  left   AFO - Performed by patient: Don/doff right AFO, Don/doff left AFO        Lower body assist Assist for lower body dressing: Touching or steadying assistance (Pt > 75%)      Toileting Toileting Toileting activity did not occur: No continent bowel/bladder event (foley, no BM) Toileting steps completed by patient: Adjust clothing prior to toileting, Performs perineal hygiene, Adjust clothing after toileting      Toileting assist Assist level: Touching or  steadying assistance (Pt.75%)   Transfers Chair/bed transfer   Chair/bed transfer method: Squat pivot Chair/bed transfer assist level: Supervision or verbal cues Chair/bed transfer assistive device: Armrests     Locomotion Ambulation     Max distance: 50 Assist level: Touching or steadying assistance (Pt > 75%)   Wheelchair   Type: Manual Max wheelchair distance: 150 Assist Level: Supervision or verbal cues  Cognition Comprehension Comprehension assist level: Follows complex conversation/direction with no assist  Expression Expression assist level: Expresses complex ideas: With no assist  Social Interaction Social Interaction assist level: Interacts appropriately with others - No medications needed.  Problem Solving Problem solving assist level: Solves complex 90% of the time/cues < 10% of the time  Memory Memory assist level: Complete Independence: No helper    Medical Problem List and Plan: 1.  Mobility and functional deficits secondary to debility after multiple medical complications  -continue CIR therapies    2.  DVT Prophylaxis/Anticoagulation: Pharmaceutical: Lovenox 3. Pain Management: tylenol. Pain controlled.  4. Mood: team to provide ego support as necessary. Pt appears motivated. 5. Neuropsych: This patient is capable of making decisions on his own behalf. 6. Skin/Wound Care: encourage nutrition.   -local skin care as necessary 7. Fluids/Electrolytes/Nutrition:  BMP within acceptable range on 9/9--recheck next week 8. E coli bacteremia/UTI: Continue to monitor for temps/symptoms rather that WBC per ID. Bactrim for 10 days   -foley out--voiding well so far. pvr's low  -stone retrieval---after discharge-follow up with urology next week re: plan 9. Hypokalemia:resolved 10. Prediabetes: dietary education for now 11. Protein calorie malnutrition: improving intake 12. 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 He does have a history of cervical stenosis, myelopathy, and subsequent surgery as wel.  -AFO/shoewear functional 13. Left olecranon bursitis---  -Improved after drainage  LOS (Days) 8 A FACE TO FACE EVALUATION WAS PERFORMED  Charlett Blake 07/30/2016 10:48 AM

## 2016-07-31 NOTE — Progress Notes (Signed)
[facility] PHYSICAL MEDICINE & REHABILITATION     PROGRESS NOTE  Subjective/Complaints:  States elbow starting to swell again  ROS: Denies CP, SOB, N/V/D.  Objective: Vital Signs: Blood pressure 127/67, pulse 62, temperature 98.9 F (37.2 C), temperature source Oral, resp. rate 18, height 5\' 9"  (1.753 m), weight 72.9 kg (160 lb 11.5 oz), SpO2 98 %. No results found. No results for input(s): WBC, HGB, HCT, PLT in the last 72 hours. No results for input(s): NA, K, CL, GLUCOSE, BUN, CREATININE, CALCIUM in the last 72 hours.  Invalid input(s): CO CBG (last 3)  No results for input(s): GLUCAP in the last 72 hours.  Wt Readings from Last 3 Encounters:  07/27/16 72.9 kg (160 lb 11.5 oz)  07/22/16 72.5 kg (159 lb 12.8 oz)  04/13/16 72.2 kg (159 lb 3.2 oz)    Physical Exam:  BP 127/67 (BP Location: Right Arm)   Pulse 62   Temp 98.9 F (37.2 C) (Oral)   Resp 18   Ht 5\' 9"  (1.753 m)   Wt 72.9 kg (160 lb 11.5 oz)   SpO2 98%   BMI 23.73 kg/m  Constitutional: He appears well-developed and well-nourished.   HENT: Normocephalic. Atraumatic. Eyes: Conj and EOM intact.  Cardiovascular: Normal rate, regular rhythm and normal heart sounds.   Respiratory: Effort normal, breath sounds normal.  GI: Soft. Nontender. Genitourinary: Foley out Musculoskeletal: He exhibits no edema or deformity.  -golf ball sized fluid collection left olecranon Neurological: He is alert and oriented.  Motor 4+/5 proximal to distal in the UE's.  B/l LE: HF 3-/5, KE 3/5, APF 4/5, ADF trace.  Skin: Skin is warm and dry.  Psychiatric: He has a normal mood and affect. His behavior is normal. Judgment and thought content normal.    Assessment/Plan: 1. Functional deficits secondary to debility after multiple medical complications which require 3+ hours per day of interdisciplinary therapy in a comprehensive inpatient rehab setting. Physiatrist is providing close team supervision and 24 hour management of active  medical problems listed below. Physiatrist and rehab team continue to assess barriers to discharge/monitor patient progress toward functional and medical goals.  Function:  Bathing Bathing position   Position: Shower  Bathing parts Body parts bathed by patient: Right arm, Left arm, Chest, Abdomen, Front perineal area, Buttocks, Right upper leg, Left upper leg, Right lower leg, Left lower leg, Back    Bathing assist Assist Level: Supervision or verbal cues   Set up : To obtain items  Upper Body Dressing/Undressing Upper body dressing   What is the patient wearing?: Pull over shirt/dress     Pull over shirt/dress - Perfomed by patient: Thread/unthread right sleeve, Thread/unthread left sleeve, Put head through opening, Pull shirt over trunk          Upper body assist Assist Level: More than reasonable time   Set up : To obtain clothing/put away  Lower Body Dressing/Undressing Lower body dressing   What is the patient wearing?: Pants     Pants- Performed by patient: Thread/unthread right pants leg, Thread/unthread left pants leg, Pull pants up/down Pants- Performed by helper: Thread/unthread right pants leg, Thread/unthread left pants leg Non-skid slipper socks- Performed by patient: Don/doff right sock, Don/doff left sock (Sock aid) Non-skid slipper socks- Performed by helper: Don/doff right sock, Don/doff left sock     Shoes - Performed by patient: Don/doff right shoe, Don/doff left shoe, Fasten right, Fasten left   AFO - Performed by patient: Don/doff right AFO, Don/doff left AFO  Lower body assist Assist for lower body dressing: Touching or steadying assistance (Pt > 75%)      Toileting Toileting Toileting activity did not occur: No continent bowel/bladder event (foley, no BM) Toileting steps completed by patient: Performs perineal hygiene Toileting steps completed by helper: Adjust clothing prior to toileting, Adjust clothing after toileting Toileting  Assistive Devices: Grab bar or rail  Toileting assist Assist level: Touching or steadying assistance (Pt.75%)   Transfers Chair/bed transfer   Chair/bed transfer method: Squat pivot Chair/bed transfer assist level: Supervision or verbal cues Chair/bed transfer assistive device: Armrests     Locomotion Ambulation     Max distance: 50 Assist level: Touching or steadying assistance (Pt > 75%)   Wheelchair   Type: Manual Max wheelchair distance: 150 Assist Level: Supervision or verbal cues  Cognition Comprehension Comprehension assist level: Follows complex conversation/direction with no assist  Expression Expression assist level: Expresses complex ideas: With no assist  Social Interaction Social Interaction assist level: Interacts appropriately with others - No medications needed.  Problem Solving Problem solving assist level: Solves complex problems: Recognizes & self-corrects  Memory Memory assist level: Complete Independence: No helper    Medical Problem List and Plan: 1.  Mobility and functional deficits secondary to debility after multiple medical complications  -continue CIR therapies    2.  DVT Prophylaxis/Anticoagulation: Pharmaceutical: Lovenox 3. Pain Management: tylenol. Pain controlled.  4. Mood: team to provide ego support as necessary. Pt appears motivated. 5. Neuropsych: This patient is capable of making decisions on his own behalf. 6. Skin/Wound Care: encourage nutrition.   -local skin care as necessary 7. Fluids/Electrolytes/Nutrition:  BMP within acceptable range on 9/9--recheck next week 8. E coli bacteremia/UTI: Continue to monitor for temps/symptoms rather that WBC per ID. Bactrim for 10 days   -foley out--voiding well so far. pvr's low,  urinary frequency  -stone retrieval---after discharge-follow up with urology next week re: plan 9. Hypokalemia:resolved 10. Prediabetes: dietary education for now 11. Protein calorie malnutrition: improving intake 12.  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 He does have a history of cervical stenosis, myelopathy, and subsequent surgery as wel.  -AFO/shoewear functional 13. Left olecranon bursitis---  -Improved after drainage  LOS (Days) 9 A FACE TO FACE EVALUATION WAS PERFORMED  Charlett Blake 07/31/2016 7:31 AM

## 2016-08-01 ENCOUNTER — Inpatient Hospital Stay (HOSPITAL_COMMUNITY): Payer: Medicare Other | Admitting: Physical Therapy

## 2016-08-01 ENCOUNTER — Inpatient Hospital Stay (HOSPITAL_COMMUNITY): Payer: Medicare Other | Admitting: Occupational Therapy

## 2016-08-01 NOTE — Progress Notes (Signed)
Occupational Therapy Session Note  Patient Details  Name: Ethan Rose MRN: [id number] Date of Birth: July 21, 1946  Today's Date: 08/01/2016 OT Individual Time: [id number] and 1400-1511 OT Individual Time Calculation (min): 59 min and 71 min     Short Term Goals: Week 2:  OT Short Term Goal 1 (Week 2): Pt will complete one grooming task standing at sink with min A in order to increase functional activity tolerance OT Short Term Goal 2 (Week 2): Pt will ambulate from EOB to toilet in room with close supervision in order to increase independence with ADLs OT Short Term Goal 3 (Week 2): Pt will complete toilet transfer and 3/3 toileting tasks without need for seated rest break.  OT Short Term Goal 4 (Week 2): Pt will ambulate within room to gather clothing in prep for showering task with min A  Skilled Therapeutic Interventions/Progress Updates:    Session 1: Upon entering the room, pt supine in bed with no c/o pain and agreeable to OT intervention. Pt performed lateral scoot transfer from bed >wheelchair with steady assist. Pt propelled into bathroom and set up wheelchair for transfer with steady assist from wheelchair >elevated toilet. Pt able to perform hygiene and clothing management with steady assistance for balance. Pt requesting to wash at shower level this session. Pt bathing from shower seat with min A for lateral leans to wash buttocks. Pt utilized long handle sponge to wash B LEs. Pt needing lifting assistance to stand from shower seat. Pt ambulated with RW and steady assistance to wheelchair for dressing with sit <>stand from sink. Pt remained seated in wheelchair with call bell and all needed items within reach for breakfast.   Session 2: Upon entering the room, pt seated in wheelchair awaiting therapist. Pt requesting to use bathroom with close supervision for lateral scoot transfer from wheelchair >elevated toilet. Pt performed hygiene and clothing management with steady assist for  balance. Pt propelled wheelchair 250' towards outside to increase B UE strength and endurance. Pt taking rest break outside and OT providing pt with paper handouts for energy conservation. OT educating pt on this topic for self care, IADLs, and general principles. Pt verbalizing understanding but education to continue. Pt propelled wheelchair on various outside terrain with increased rest breaks secondary to fatigue. OT assisted pt in returning back to unit. Pt returned to bed at end of session with supervision. Call bell and all needed items within reach upon exiting the room.   Therapy Documentation Precautions:  Precautions Precautions: Fall Restrictions Weight Bearing Restrictions: No    Pain: Pain Assessment Pain Assessment: No/denies pain ADL: ADL ADL Comments: see Functional Assessment Tool  See Function Navigator for Current Functional Status.   Therapy/Group: Individual Therapy  [name] 08/01/2016, 10:22 AM

## 2016-08-01 NOTE — Progress Notes (Signed)
Physical Therapy Session Note  Patient Details  Name: Ethan Rose MRN: [id number] Date of Birth: 20-Oct-1946  Today's Date: 08/01/2016 PT Individual Time: 0805-0900 PT Individual Time Calculation (min): 55 min    Short Term Goals: Week 2:  PT Short Term Goal 1 (Week 2): Pt will ambulate 86' with rollator and min guard PT Short Term Goal 2 (Week 2): Pt will perform stand pivot transfers with min guard PT Short Term Goal 3 (Week 2): Pt will perform bed mobility modI PT Short Term Goal 4 (Week 2): Pt will perform gait up/down ramp with rollator and minA  Skilled Therapeutic Interventions/Progress Updates:   Pt received seated in w/c, denies pain and agreeable to treatment. Transfer w/c <>BSC over toilet with grab bars and min A for sit >stand. Min guard in standing with UE support on rail while pt donned/doffed pants. Performed hygiene modI. Grooming performed at sink w/c level with modI. Pt dons B shoes/AFOs with S and increased time. W/c propulsion to gym with BUE for strengthening x150' modI. Gait x37' with RW and min guard. Sit <>stand from elevated mat table with BUE support on therapist's chair in front of pt to reduce UE reliance and increase BLE glute activation; 6 reps total before fatigued. Gait x10' with RW and min guard. Nustep x8 min level 5 with BLE and occasional assistance of UEs; performed for BLE NMR, strengthening, coordination and aerobic endurance. Returned to room in w/c totalA for energy conservation. Remained seated in w/c at end of session, all needs in reach.   Therapy Documentation Precautions:  Precautions Precautions: Fall Restrictions Weight Bearing Restrictions: No   See Function Navigator for Current Functional Status.   Therapy/Group: Individual Therapy  Luberta Mutter 08/01/2016, 8:48 AM

## 2016-08-01 NOTE — Progress Notes (Signed)
[place] PHYSICAL MEDICINE & REHABILITATION     PROGRESS NOTE  Subjective/Complaints:  Left elbow doing well. No new complaints other than urinary frequency at night. However, he's urinating while supine and EOB only.   ROS: Denies CP, SOB, N/V/D.  Objective: Vital Signs: Blood pressure 130/65, pulse 63, temperature 98.3 F (36.8 C), temperature source Oral, resp. rate 16, height 5\' 9"  (1.753 m), weight 72.9 kg (160 lb 11.5 oz), SpO2 98 %. No results found. No results for input(s): WBC, HGB, HCT, PLT in the last 72 hours. No results for input(s): NA, K, CL, GLUCOSE, BUN, CREATININE, CALCIUM in the last 72 hours.  Invalid input(s): CO CBG (last 3)  No results for input(s): GLUCAP in the last 72 hours.  Wt Readings from Last 3 Encounters:  07/27/16 72.9 kg (160 lb 11.5 oz)  07/22/16 72.5 kg (159 lb 12.8 oz)  04/13/16 72.2 kg (159 lb 3.2 oz)    Physical Exam:  BP 130/65 (BP Location: Right Arm)   Pulse 63   Temp 98.3 F (36.8 C) (Oral)   Resp 16   Ht 5\' 9"  (1.753 m)   Wt 72.9 kg (160 lb 11.5 oz)   SpO2 98%   BMI 23.73 kg/m  Constitutional: He appears well-developed and well-nourished.   HENT: Normocephalic. Atraumatic. Eyes: Conj and EOM intact.  Cardiovascular: Normal rate, regular rhythm and normal heart sounds.   Respiratory: Effort normal, breath sounds normal.  GI: Soft. Nontender. Genitourinary: Foley out Musculoskeletal: He exhibits no edema or deformity.  -golf ball sized fluid collection left olecranon Neurological: He is alert and oriented.  Motor 4+/5 proximal to distal in the UE's.  B/l LE: HF 3-/5, KE 3/5, APF 4/5, ADF trace.  Skin: Skin is warm and dry.  Psychiatric: He has a normal mood and affect. His behavior is normal. Judgment and thought content normal.    Assessment/Plan: 1. Functional deficits secondary to debility after multiple medical complications which require 3+ hours per day of interdisciplinary therapy in a comprehensive inpatient  rehab setting. Physiatrist is providing close team supervision and 24 hour management of active medical problems listed below. Physiatrist and rehab team continue to assess barriers to discharge/monitor patient progress toward functional and medical goals.  Function:  Bathing Bathing position   Position: Shower  Bathing parts Body parts bathed by patient: Right arm, Left arm, Chest, Abdomen, Front perineal area, Buttocks, Right upper leg, Left upper leg, Right lower leg, Left lower leg, Back    Bathing assist Assist Level: Supervision or verbal cues   Set up : To obtain items  Upper Body Dressing/Undressing Upper body dressing   What is the patient wearing?: Pull over shirt/dress     Pull over shirt/dress - Perfomed by patient: Thread/unthread right sleeve, Thread/unthread left sleeve, Put head through opening, Pull shirt over trunk          Upper body assist Assist Level: More than reasonable time   Set up : To obtain clothing/put away  Lower Body Dressing/Undressing Lower body dressing   What is the patient wearing?: Shoes, AFO     Pants- Performed by patient: Thread/unthread right pants leg, Thread/unthread left pants leg, Pull pants up/down Pants- Performed by helper: Thread/unthread right pants leg, Thread/unthread left pants leg Non-skid slipper socks- Performed by patient: Don/doff right sock, Don/doff left sock (Sock aid) Non-skid slipper socks- Performed by helper: Don/doff right sock, Don/doff left sock     Shoes - Performed by patient: Don/doff right shoe, Don/doff left shoe,  Fasten right, Fasten left   AFO - Performed by patient: Don/doff right AFO, Don/doff left AFO        Lower body assist Assist for lower body dressing: Touching or steadying assistance (Pt > 75%)      Toileting Toileting Toileting activity did not occur: No continent bowel/bladder event (foley, no BM) Toileting steps completed by patient: Performs perineal hygiene, Adjust clothing after  toileting, Adjust clothing prior to toileting Toileting steps completed by helper: Adjust clothing prior to toileting, Adjust clothing after toileting Toileting Assistive Devices: Grab bar or rail  Toileting assist Assist level: Touching or steadying assistance (Pt.75%)   Transfers Chair/bed transfer   Chair/bed transfer method: Stand pivot Chair/bed transfer assist level: Touching or steadying assistance (Pt > 75%) Chair/bed transfer assistive device: Armrests, Medical sales representative     Max distance: 37 Assist level: Touching or steadying assistance (Pt > 75%)   Wheelchair   Type: Manual Max wheelchair distance: 150 Assist Level: Supervision or verbal cues  Cognition Comprehension Comprehension assist level: Follows complex conversation/direction with no assist  Expression Expression assist level: Expresses complex ideas: With no assist  Social Interaction Social Interaction assist level: Interacts appropriately with others - No medications needed.  Problem Solving Problem solving assist level: Solves complex problems: Recognizes & self-corrects  Memory Memory assist level: Complete Independence: No helper    Medical Problem List and Plan: 1.  Mobility and functional deficits secondary to debility after multiple medical complications  -continue CIR therapies    2.  DVT Prophylaxis/Anticoagulation: Pharmaceutical: Lovenox 3. Pain Management: tylenol. Pain controlled.  4. Mood: team to provide ego support as necessary. Pt appears motivated. 5. Neuropsych: This patient is capable of making decisions on his own behalf. 6. Skin/Wound Care: encourage nutrition.   -local skin care as necessary 7. Fluids/Electrolytes/Nutrition:  BMP within acceptable range on 9/9--recheck next week 8. E coli bacteremia/UTI: Continue to monitor for temps/symptoms rather that WBC per ID. Bactrim for 10 days---through today   -re-check ua/ucx after abx completed  -stone retrieval---after  discharge-follow up with urology re: plan 9. Hypokalemia:resolved 10. Prediabetes: dietary education for now 11. Protein calorie malnutrition: improving intake 12. 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 He does have a history of cervical stenosis, myelopathy, and subsequent surgery as wel.  -AFO/shoewear functional 13. Left olecranon bursitis---  -continue pressure relief  -prn ice  -overall improved LOS (Days) 10 A FACE TO FACE EVALUATION WAS PERFORMED  SWARTZ,ZACHARY T 08/01/2016 8:49 AM

## 2016-08-02 ENCOUNTER — Inpatient Hospital Stay (HOSPITAL_COMMUNITY): Payer: Medicare Other | Admitting: Physical Therapy

## 2016-08-02 ENCOUNTER — Other Ambulatory Visit: Payer: Self-pay | Admitting: Urology

## 2016-08-02 ENCOUNTER — Inpatient Hospital Stay (HOSPITAL_COMMUNITY): Payer: Medicare Other | Admitting: Occupational Therapy

## 2016-08-02 LAB — URINE MICROSCOPIC-ADD ON

## 2016-08-02 LAB — URINALYSIS, ROUTINE W REFLEX MICROSCOPIC
BILIRUBIN URINE: NEGATIVE
GLUCOSE, UA: NEGATIVE mg/dL
KETONES UR: NEGATIVE mg/dL
Nitrite: NEGATIVE
PROTEIN: 100 mg/dL — AB
Specific Gravity, Urine: 1.018 (ref 1.005–1.030)
pH: 6.5 (ref 5.0–8.0)

## 2016-08-02 NOTE — Progress Notes (Signed)
[place] PHYSICAL MEDICINE & REHABILITATION     PROGRESS NOTE  Subjective/Complaints:  No new issues. Still concerned that he's not as strong in his leg as he thinks he needs to be.    ROS: Denies CP, SOB, N/V/D.  Objective: Vital Signs: Blood pressure 127/67, pulse 63, temperature 98.4 F (36.9 C), temperature source Oral, resp. rate 18, height 5\' 9"  (1.753 m), weight 72.9 kg (160 lb 11.5 oz), SpO2 99 %. No results found. No results for input(s): WBC, HGB, HCT, PLT in the last 72 hours. No results for input(s): NA, K, CL, GLUCOSE, BUN, CREATININE, CALCIUM in the last 72 hours.  Invalid input(s): CO CBG (last 3)  No results for input(s): GLUCAP in the last 72 hours.  Wt Readings from Last 3 Encounters:  07/27/16 72.9 kg (160 lb 11.5 oz)  07/22/16 72.5 kg (159 lb 12.8 oz)  04/13/16 72.2 kg (159 lb 3.2 oz)    Physical Exam:  BP 127/67 (BP Location: Right Arm)   Pulse 63   Temp 98.4 F (36.9 C) (Oral)   Resp 18   Ht 5\' 9"  (1.753 m)   Wt 72.9 kg (160 lb 11.5 oz)   SpO2 99%   BMI 23.73 kg/m  Constitutional: He appears well-developed and well-nourished.   HENT: Normocephalic. Atraumatic. Eyes: Conj and EOM intact.  Cardiovascular: Normal rate, regular rhythm and normal heart sounds.   Respiratory: Effort normal, breath sounds normal.  GI: Soft. Nontender. Genitourinary: n/a Musculoskeletal: He exhibits no edema or deformity.  -golf ball sized fluid collection left olecranon Neurological: He is alert and oriented.  Motor 4+/5 proximal to distal in the UE's.  B/l LE: HF 3/5, KE 3+/5, APF 4/5, ADF trace.  Skin: Skin is warm and dry.  Psychiatric: He has a normal mood and affect. His behavior is normal. Judgment and thought content normal.    Assessment/Plan: 1. Functional deficits secondary to debility after multiple medical complications which require 3+ hours per day of interdisciplinary therapy in a comprehensive inpatient rehab setting. Physiatrist is providing  close team supervision and 24 hour management of active medical problems listed below. Physiatrist and rehab team continue to assess barriers to discharge/monitor patient progress toward functional and medical goals.  Function:  Bathing Bathing position   Position: Shower  Bathing parts Body parts bathed by patient: Right arm, Left arm, Chest, Abdomen, Front perineal area, Buttocks, Right upper leg, Left upper leg, Right lower leg, Left lower leg, Back    Bathing assist Assist Level: Supervision or verbal cues   Set up : To obtain items  Upper Body Dressing/Undressing Upper body dressing   What is the patient wearing?: Pull over shirt/dress     Pull over shirt/dress - Perfomed by patient: Thread/unthread right sleeve, Thread/unthread left sleeve, Put head through opening, Pull shirt over trunk          Upper body assist Assist Level: Set up   Set up : To obtain clothing/put away  Lower Body Dressing/Undressing Lower body dressing   What is the patient wearing?: Pants, Non-skid slipper socks     Pants- Performed by patient: Thread/unthread right pants leg, Thread/unthread left pants leg, Pull pants up/down Pants- Performed by helper: Thread/unthread right pants leg, Thread/unthread left pants leg Non-skid slipper socks- Performed by patient: Don/doff right sock, Don/doff left sock Non-skid slipper socks- Performed by helper: Don/doff right sock, Don/doff left sock     Shoes - Performed by patient: Don/doff right shoe, Don/doff left shoe, Fasten right, [employer]  left   AFO - Performed by patient: Don/doff right AFO, Don/doff left AFO        Lower body assist Assist for lower body dressing: Touching or steadying assistance (Pt > 75%)      Toileting Toileting Toileting activity did not occur: No continent bowel/bladder event (foley, no BM) Toileting steps completed by patient: Performs perineal hygiene, Adjust clothing after toileting, Adjust clothing prior to  toileting Toileting steps completed by helper: Adjust clothing prior to toileting, Adjust clothing after toileting Toileting Assistive Devices: Grab bar or rail  Toileting assist Assist level: Touching or steadying assistance (Pt.75%)   Transfers Chair/bed transfer   Chair/bed transfer method: Stand pivot Chair/bed transfer assist level: Touching or steadying assistance (Pt > 75%) Chair/bed transfer assistive device: Armrests, Medical sales representative     Max distance: 15 Assist level: Touching or steadying assistance (Pt > 75%)   Wheelchair   Type: Manual Max wheelchair distance: 250 Assist Level: Supervision or verbal cues  Cognition Comprehension Comprehension assist level: Follows complex conversation/direction with no assist  Expression Expression assist level: Expresses complex ideas: With no assist  Social Interaction Social Interaction assist level: Interacts appropriately with others - No medications needed.  Problem Solving Problem solving assist level: Solves complex problems: Recognizes & self-corrects  Memory Memory assist level: Complete Independence: No helper    Medical Problem List and Plan: 1.  Mobility and functional deficits secondary to debility after multiple medical complications  -continue CIR therapies  -team conference today 2.  DVT Prophylaxis/Anticoagulation: Pharmaceutical: Lovenox 3. Pain Management: tylenol. Pain controlled.  4. Mood: team to provide ego support as necessary. Pt appears motivated. 5. Neuropsych: This patient is capable of making decisions on his own behalf. 6. Skin/Wound Care: encourage nutrition.   -local skin care as necessary 7. Fluids/Electrolytes/Nutrition:  -encourage PO--recheck bmet thursday 8. E coli bacteremia/UTI: Continue to monitor for temps/symptoms rather that WBC per ID. Bactrim for 10 days---complete  -re-check ua/ucx   -stone retrieval---after discharge-follow up with urology   9.  Hypokalemia:resolved 10. Prediabetes: dietary education for now 11. Protein calorie malnutrition: improving intake 12. 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 He does have a history of cervical stenosis, myelopathy, and subsequent surgery as wel.  -AFO/shoewear functional 13. Left olecranon bursitis---  -continue pressure relief  -prn ice  -overall improved LOS (Days) 11 A FACE TO FACE EVALUATION WAS PERFORMED  SWARTZ,ZACHARY T 08/02/2016 8:39 AM

## 2016-08-02 NOTE — Plan of Care (Signed)
Problem: RH Ambulation Goal: LTG Patient will ambulate in home environment (PT) LTG: Patient will ambulate in home environment, # of feet with assistance (PT).  Upgraded d/t decreased caregiver support

## 2016-08-02 NOTE — Progress Notes (Signed)
Physical Therapy Note  Patient Details  Name: Ethan Rose MRN: [id number] Date of Birth: 09-05-1946 Today's Date: 08/02/2016    Time: 1400-1430 30 minutes  1:1 No c/o pain, pt c/p LE fatigue.  Squat pivot transfers and w/c mobility with supervision. Sit to stand from continuously descending surfaces with supervision-mod A depending on height.  Standing tolerance with 1 hand assist up to 90 seconds before fatigue.  Seated core strengthening exercises with good tolerance.   Bonnita Newby 08/02/2016, 2:34 PM

## 2016-08-02 NOTE — Progress Notes (Signed)
Physical Therapy Session Note  Patient Details  Name: Ethan Rose MRN: [id number] Date of Birth: 1946-09-10  Today's Date: 08/02/2016 PT Individual Time: 0900-1000 PT Individual Time Calculation (min): 60 min    Short Term Goals: Week 2:  PT Short Term Goal 1 (Week 2): Pt will ambulate 60' with rollator and min guard PT Short Term Goal 2 (Week 2): Pt will perform stand pivot transfers with min guard PT Short Term Goal 3 (Week 2): Pt will perform bed mobility modI PT Short Term Goal 4 (Week 2): Pt will perform gait up/down ramp with rollator and minA  Skilled Therapeutic Interventions/Progress Updates:   Pt received seated in w/c, denies pain and agreeable to treatment. Dons B shoes/AFOs with setup and increased time, use of stool for foot positioning. Gait x3 trials, 32', 50', and 45' with min guard and RW; modA sit <>stand from low w/c seat and assist for eccentric control when sitting. Sit <>stand x6 reps from edge of mat table with mini squats performed in standing. Supine bridging 3x10 reps with manual facilitation at glutes to increase activation. Prone "superman" trunk extension with scapular retraction and glute activation x5 reps. Gait with rollator x20' with min guard; improved sit <>stand with heavy reliance on UEs on rollator but less assist needed. Returned to room totalA; remained seated in w/c at end of session, all needs in reach.   Therapy Documentation Precautions:  Precautions Precautions: Fall Restrictions Weight Bearing Restrictions: No   See Function Navigator for Current Functional Status.   Therapy/Group: Individual Therapy  Luberta Mutter 08/02/2016, 10:27 AM

## 2016-08-02 NOTE — Progress Notes (Signed)
Occupational Therapy Session Note  Patient Details  Name: Ethan Rose MRN: [id number] Date of Birth: [birth year]  Today's Date: 08/02/2016 OT Individual Time: 1045-1200 and 1430-1510 OT Individual Time Calculation (min): 75 min and 40 min    Short Term Goals:Week 2:  OT Short Term Goal 1 (Week 2): Pt will complete one grooming task standing at sink with min A in order to increase functional activity tolerance OT Short Term Goal 2 (Week 2): Pt will ambulate from EOB to toilet in room with close supervision in order to increase independence with ADLs OT Short Term Goal 3 (Week 2): Pt will complete toilet transfer and 3/3 toileting tasks without need for seated rest break.  OT Short Term Goal 4 (Week 2): Pt will ambulate within room to gather clothing in prep for showering task with min A  Skilled Therapeutic Interventions/Progress Updates:    Session One: Pt seen for OT session focusing on functional transfers and mobility. Pt sitting up in w/c upon arrival, agreeable to tx session. He declined bathing/ dressing session this morning. Since PT session earlier this morning, pt now using rollator for ombility, the AD used PTA. He ambulated with rollator to bathroom for otileting task, one LOB episode due to not clearing foot and requiring min-mod A to regain balance.  In ADL apartment, pt completed simulated shower stall transfer. Pt had therapist set-up transfer in simulation of technique he was doing PTA, utilizing rollator and RW to [facility]. Completed with min steadying assist and rest breaks throughout.  He then completed kitchen mobility using rollator, alternating btwn standing and seated tasks in rollator. Gathered items from overhead cabinet, accessed refrigerator, and loaded dishwasher.   Pt ambulated with rollator back towards room, requiring one seated rest break. Pt left seated in w/c at end of session, set-up with meal tray and all needs in reach  Session Two: Pt seen for OT session  focusing on functional transfers. Pt received in therapy gym with hand off from PT, voicing increased fatigue, however, willing to attempt therapy.  In ADL apartment, completed another simulated shower stall transfer using rollator and RW to transfer to [facility]. Completed with steadying assist. Extended rest breaks required throughout session following sit <> stands. He then self propelled w/c to family room. He completed transfer from low soft surface couch (with one arm rest). Pt very anxious initially, however, was able to complete with mod-max A following VCs for technique. Reviewed technique for pt to be able to direct caregiver with how to assist with transfer for sit <> stand if needed.    Pt returned to room at end of session requesting to go to bathroom. He completed squat pivots to elevated toilet and required assist for clothing management due to fatigue.  Pt returned to supine at end of session, all needs in reach.   Therapy Documentation Precautions:  Precautions Precautions: Fall Restrictions Weight Bearing Restrictions: No Pain: Pain Assessment Pain Assessment: No/denies pain ADL: ADL ADL Comments: see Functional Assessment Tool  See Function Navigator for Current Functional Status.   Therapy/Group: Individual Therapy  [name], Aikam Hellickson C 08/02/2016, 7:26 AM

## 2016-08-03 ENCOUNTER — Inpatient Hospital Stay (HOSPITAL_COMMUNITY): Payer: Medicare Other | Admitting: Occupational Therapy

## 2016-08-03 ENCOUNTER — Inpatient Hospital Stay (HOSPITAL_COMMUNITY): Payer: Medicare Other | Admitting: *Deleted

## 2016-08-03 ENCOUNTER — Inpatient Hospital Stay (HOSPITAL_COMMUNITY): Payer: Medicare Other | Admitting: Physical Therapy

## 2016-08-03 LAB — URINE CULTURE: Culture: <10000 — AB

## 2016-08-03 MED ORDER — IPRATROPIUM-ALBUTEROL 0.5-2.5 (3) MG/3ML IN SOLN
RESPIRATORY_TRACT | Status: AC
  Filled 2016-08-03: qty 3

## 2016-08-03 NOTE — Progress Notes (Signed)
[facility] PHYSICAL MEDICINE & REHABILITATION     PROGRESS NOTE  Subjective/Complaints:  No new issues. Was up with rollator and standard RW yesterday. Was encouraged by his ambulation  ROS: Denies CP, SOB, N/V/D.  Objective: Vital Signs: Blood pressure 123/67, pulse 66, temperature 98.4 F (36.9 C), temperature source Oral, resp. rate 17, height 5\' 9"  (1.753 m), weight 72.9 kg (160 lb 11.5 oz), SpO2 97 %. No results found. No results for input(s): WBC, HGB, HCT, PLT in the last 72 hours. No results for input(s): NA, K, CL, GLUCOSE, BUN, CREATININE, CALCIUM in the last 72 hours.  Invalid input(s): CO CBG (last 3)  No results for input(s): GLUCAP in the last 72 hours.  Wt Readings from Last 3 Encounters:  07/27/16 72.9 kg (160 lb 11.5 oz)  07/22/16 72.5 kg (159 lb 12.8 oz)  04/13/16 72.2 kg (159 lb 3.2 oz)    Physical Exam:  BP 123/67 (BP Location: Right Arm)   Pulse 66   Temp 98.4 F (36.9 C) (Oral)   Resp 17   Ht 5\' 9"  (1.753 m)   Wt 72.9 kg (160 lb 11.5 oz)   SpO2 97%   BMI 23.73 kg/m  Constitutional: He appears well-developed and well-nourished.   HENT: Normocephalic. Atraumatic. Eyes: Conj and EOM intact.  Cardiovascular: Normal rate, regular rhythm and normal heart sounds.   Respiratory: Effort normal, breath sounds normal.  GI: Soft. Nontender. Genitourinary: n/a Musculoskeletal: He exhibits no edema or deformity.  -golf ball sized fluid collection left olecranon Neurological: He is alert and oriented.  Motor 4+/5 proximal to distal in the UE's.  B/l LE: HF 3/5, KE 3+/5, APF 4/5, ADF trace.  Skin: Skin is warm and dry.  Psychiatric: He has a normal mood and affect. His behavior is normal. Judgment and thought content normal.    Assessment/Plan: 1. Functional deficits secondary to debility after multiple medical complications which require 3+ hours per day of interdisciplinary therapy in a comprehensive inpatient rehab setting. Physiatrist is providing  close team supervision and 24 hour management of active medical problems listed below. Physiatrist and rehab team continue to assess barriers to discharge/monitor patient progress toward functional and medical goals.  Function:  Bathing Bathing position   Position: Shower  Bathing parts Body parts bathed by patient: Right arm, Left arm, Chest, Abdomen, Front perineal area, Buttocks, Right upper leg, Left upper leg, Right lower leg, Left lower leg, Back    Bathing assist Assist Level: Supervision or verbal cues   Set up : To obtain items  Upper Body Dressing/Undressing Upper body dressing   What is the patient wearing?: Pull over shirt/dress     Pull over shirt/dress - Perfomed by patient: Thread/unthread right sleeve, Thread/unthread left sleeve, Put head through opening, Pull shirt over trunk          Upper body assist Assist Level: Set up   Set up : To obtain clothing/put away  Lower Body Dressing/Undressing Lower body dressing   What is the patient wearing?: Pants, Non-skid slipper socks     Pants- Performed by patient: Thread/unthread right pants leg, Thread/unthread left pants leg, Pull pants up/down Pants- Performed by helper: Thread/unthread right pants leg, Thread/unthread left pants leg Non-skid slipper socks- Performed by patient: Don/doff right sock, Don/doff left sock Non-skid slipper socks- Performed by helper: Don/doff right sock, Don/doff left sock     Shoes - Performed by patient: Don/doff right shoe, Don/doff left shoe, Fasten right, Fasten left   AFO - Performed  by patient: Don/doff right AFO, Don/doff left AFO        Lower body assist Assist for lower body dressing: Touching or steadying assistance (Pt > 75%)      Toileting Toileting Toileting activity did not occur: No continent bowel/bladder event (foley, no BM) Toileting steps completed by patient: Performs perineal hygiene Toileting steps completed by helper: Adjust clothing prior to toileting,  Adjust clothing after toileting Toileting Assistive Devices: Grab bar or rail  Toileting assist Assist level: Touching or steadying assistance (Pt.75%)   Transfers Chair/bed transfer   Chair/bed transfer method: Stand pivot Chair/bed transfer assist level: Touching or steadying assistance (Pt > 75%) Chair/bed transfer assistive device: Armrests, Medical sales representative     Max distance: 50 Assist level: Touching or steadying assistance (Pt > 75%)   Wheelchair   Type: Manual Max wheelchair distance: 250 Assist Level: Supervision or verbal cues  Cognition Comprehension Comprehension assist level: Follows complex conversation/direction with no assist  Expression Expression assist level: Expresses complex ideas: With no assist  Social Interaction Social Interaction assist level: Interacts appropriately with others - No medications needed.  Problem Solving Problem solving assist level: Solves complex problems: Recognizes & self-corrects  Memory Memory assist level: Complete Independence: No helper    Medical Problem List and Plan: 1.  Mobility and functional deficits secondary to debility after multiple medical complications  -continue CIR therapies 2.  DVT Prophylaxis/Anticoagulation: Pharmaceutical: Lovenox 3. Pain Management: tylenol. Pain controlled.  4. Mood: team to provide ego support as necessary. Pt appears motivated. 5. Neuropsych: This patient is capable of making decisions on his own behalf. 6. Skin/Wound Care: encourage nutrition.   -local skin care as necessary 7. Fluids/Electrolytes/Nutrition:  -encourage PO--recheck bmet tomorrow 8. E coli bacteremia/UTI: Continue to monitor for temps/symptoms rather that WBC per ID. Bactrim for 10 days---complete  -re-check ua suspicious---will await urine culture   -stone retrieval---after discharge-follow up with urology (10/5)   9. Hypokalemia:resolved 10. Prediabetes: dietary education for now 11. Protein calorie  malnutrition: improving intake 12. 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 He does have a history of cervical stenosis, myelopathy, and subsequent surgery as wel.  -AFO/shoewear functional 13. Left olecranon bursitis---  -continue pressure relief--using elbow pad  -prn ice  -overall improved LOS (Days) 12 A FACE TO FACE EVALUATION WAS PERFORMED  Katherleen Folkes T 08/03/2016 9:13 AM

## 2016-08-03 NOTE — Progress Notes (Signed)
Physical Therapy Session Note  Patient Details  Name: Ethan Rose MRN: [id number] Date of Birth: 1946-09-28  Today's Date: 08/03/2016 PT Individual Time: 0900-1000 PT Individual Time Calculation (min): 60 min    Short Term Goals: Week 2:  PT Short Term Goal 1 (Week 2): Pt will ambulate 62' with rollator and min guard PT Short Term Goal 2 (Week 2): Pt will perform stand pivot transfers with min guard PT Short Term Goal 3 (Week 2): Pt will perform bed mobility modI PT Short Term Goal 4 (Week 2): Pt will perform gait up/down ramp with rollator and minA  Skilled Therapeutic Interventions/Progress Updates:   Pt received seated in bed; denies pain and agreeable to treatment. Supine>sit modI. Shoes/AFOs donned totalA for time/energy conservation. Gait with rollator x30' with close S; seated rest break on rollator seat d/t fatigue. Stand pivot transfer to mat table minA for sit >stand from low w/c seat height. Sitting>quadruped>tall kneeling with BUE support on bench with min guard overall. Tall kneeling with alternating UE ball hits for glute/hamstring activation and core stability; requires several rest breaks on elbows. Side steps in parallel bars, 3x10' R/L with manual facilitation at B glutes and verbal cues for upright posture and hip extension. Standing kinetron with weight shifts R/L and static standing; difficulty with maintaining stance on RLE due to pt feeling LE "buckling" and max cues needed for glute/quad activation. Returned to room with w/c propulsion BUE. Remained seated in w/c at end of session, all needs in reach.   Therapy Documentation Precautions:  Precautions Precautions: Fall Restrictions Weight Bearing Restrictions: No   See Function Navigator for Current Functional Status.   Therapy/Group: Individual Therapy  Luberta Mutter 08/03/2016, 10:01 AM

## 2016-08-03 NOTE — Progress Notes (Signed)
Physical Therapy Session Note  Patient Details  Name: Ethan Rose MRN: [id number] Date of Birth: 1946-04-23  Today's Date: 08/03/2016 PT Individual Time: 1335-1450 PT Individual Time Calculation (min): 75 min    Short Term Goals: Week 2:  PT Short Term Goal 1 (Week 2): Pt will ambulate 48' with rollator and min guard PT Short Term Goal 2 (Week 2): Pt will perform stand pivot transfers with min guard PT Short Term Goal 3 (Week 2): Pt will perform bed mobility modI PT Short Term Goal 4 (Week 2): Pt will perform gait up/down ramp with rollator and minA  Skilled Therapeutic Interventions/Progress Updates:   Pt presented in bed agreeable for therapy.  Pt performed supine to sit at EOB with HOB elevated and use of bed rails, required additional time however able to complete with supervision. PTA assisted with donning socks/shoes/AFO's 2/2 time constraints.  Pt able to perform lateral scoot to/from w/c with supervision.  Propelled to rehab gym with safe technique, lateral scoot to NuStep. NuStep L2 x29mn with brief break at 5 min for endurance.  Pt transferred to mat from w/c to/from mat with supervision, pt able to lay in supine with supervision and add'l time as needed. Performed supine therex for increasing hip strength.  Hip flexion/HS pulls with red physiioball 2x10 bilaterally with tactile cues for maintaining neutral.  AASLR off red physioball 2x5 bilaterally.  Heel slides 2x5 bilaterally, bridges 2x10.  Pt returned to sitting in same manner, sit to stand from elevated mat and RW.  Performed toe taps onto 2" step 2x10 bilaterally with manual assist of hip flexion when performing on LLE and manual cues for erect posture. Stand pivot with RW to w/c with decreased LLE usage 2/2 fatigue. Once in chair pt able to propel back to room where pt remained in w/c with all current needs met.   Therapy Documentation Precautions:  Precautions Precautions: Fall Restrictions Weight Bearing Restrictions:  No General:   Vital Signs: Therapy Vitals Temp: 98.3 F (36.8 C) Temp Source: Oral Pulse Rate: 78 Resp: 18 BP: 131/80 Patient Position (if appropriate): Lying Oxygen Therapy SpO2: 94 % O2 Device: Not Delivered Pain:   See Function Navigator for Current Functional Status.   Therapy/Group: Individual Therapy  [name]  [name], PTA  08/03/2016, 3:17 PM

## 2016-08-03 NOTE — Progress Notes (Signed)
Recreational Therapy Session Note  Patient Details  Name: [name] MRN: [id number] Date of Birth: [birth year] Today's Date: 08/03/2016  Pain: no c/o Skilled Therapeutic Interventions/Progress Updates: Session focused on activity tolerance, functional mobility, dynamic balance-tall kneeling & safety awareness.  Pt ambulated with rollator ~30' with close supervision.  Pt performed stand pivot transfer with min assist to mat.  Dicussion with pt about safe use of rollator at discharge, introduced purpose of transport chair as an option for community pursuits.  Pt transitioned to tall kneeling with min assist for ball tap activity alternating use of UE's.  Pt required frequent rest breaks due to fatigue.  Therapy/Group: Co-Treatment  [place],Harris Penton 08/03/2016, 3:37 PM

## 2016-08-03 NOTE — Patient Care Conference (Signed)
Inpatient RehabilitationTeam Conference and Plan of Care Update Date: 08/02/2016   Time: 2:05 PM    Patient Name: Ethan Rose      Medical Record Number: [id number]  Date of Birth: Oct 12, 1946 Sex: Male         Room/Bed: 4M06C/4M06C-01 Payor Info: Payor: MEDICARE / Plan: MEDICARE PART A AND B / Product Type: *No Product type* /    Admitting Diagnosis: sepsis  Admit Date/Time:  07/22/2016  1:26 PM Admission Comments: No comment available   Primary Diagnosis:  Debility Principal Problem: Debility  Patient Active Problem List   Diagnosis Date Noted  . Debility 07/22/2016  . Pressure ulcer 07/18/2016  . E coli bacteremia   . Leukocytosis   . Sepsis ([place]) 07/16/2016  . Severe sepsis with septic shock ([place]) 07/16/2016  . Pyelonephritis 07/16/2016  . Left ureteral stone 07/16/2016  . Septic shock ([place]) 07/16/2016  . Cervical myelopathy ([place]) 03/18/2016  . 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 Other malaise and fatigue 05/14/2014  . Health maintenance examination 04/29/2013  . Weakness of both legs 04/29/2013  . Leg cramps 04/29/2013  . Elevated blood pressure reading without diagnosis of hypertension 03/30/2012  . Dizziness 03/30/2012    Expected Discharge Date: Expected Discharge Date: 08/10/16  Team Members Present: Physician leading conference: Dr. Alger Simons Social Worker Present: Lennart Pall, LCSW Nurse Present: Heather Roberts, RN PT Present: Kem Parkinson, PT OT Present: Napoleon Form, OT SLP Present: Gunnar Fusi, SLP PPS Coordinator present : Daiva Nakayama, RN, CRRN     Current Status/Progress Goal Weekly Team Focus  Medical   improving stamina but still with proximal muscle weakness  see prior  nutrition, muscle strengthening   Bowel/Bladder   continent of bowel/bladder. LBM 07/31/16  Patient to remain continent of bowel/bladder while in [facility] with  min assist.  Monitor bowel/bladder function q shift and as needed   Swallow/Nutrition/ Hydration             ADL's   Min A transfers (can be mod when fatigued), steadying assist toileting and LB dressing; mod I UB dressing and grooming  Supervision- mod I overall  Activity tolerance, functional transfers, strengthening   Mobility   modI bed mobility, minA transfers, min guard gait x50'   modI bed mobility and transfers, S gait 16' with rollator in home  LE strengthening, endurance, transfer/gait training   Communication             Safety/Cognition/ Behavioral Observations            Pain   no complain of pain  <3   Monitor and treat any pain q shift and as needed   Skin   no new skin skin issue noted  Remain free from new skin breakdown/infection while in [facility] with min assist  Monitor skin q shift and as needed    Rehab Goals Patient on target to meet rehab goals: Yes *See Care Plan and progress notes for long and short-term goals.  Barriers to Discharge: see prior    Possible Resolutions to Barriers:  orthotic, continued strength training, see prior    Discharge Planning/Teaching Needs:  Plan to d/c home with intermittent assist of family.  May be able to provide 24/7 assist initially.  Teaching to be scheduled closer to d/c.   Team Discussion:  Urology has scheduled OP visit for stone removal.  Making gains but can still be  a mod-max assist to stand.  Did much better today with a rollator.  Steady assist with ALDs.  On track for d/c next week.  Revisions to Treatment Plan:  None   Continued Need for Acute Rehabilitation Level of Care: The patient requires daily medical management by a physician with specialized training in physical medicine and rehabilitation for the following conditions: Daily direction of a multidisciplinary physical rehabilitation program to ensure safe treatment while eliciting the highest outcome that is of practical value to the patient.: Yes Daily  medical management of patient stability for increased activity during participation in an intensive rehabilitation regime.: Yes Daily analysis of laboratory values and/or radiology reports with any subsequent need for medication adjustment of medical intervention for : Neurological problems;Urological problems  Amir Fick 08/03/2016, 1:35 PM

## 2016-08-03 NOTE — Progress Notes (Signed)
Occupational Therapy Session Note  Patient Details  Name: Ethan Rose MRN: [id number] Date of Birth: [birth year]  Today's Date: 08/03/2016 OT Individual Time: 1045-1200 OT Individual Time Calculation (min): 75 min     Short Term Goals:Week 2:  OT Short Term Goal 1 (Week 2): Pt will complete one grooming task standing at sink with min A in order to increase functional activity tolerance OT Short Term Goal 2 (Week 2): Pt will ambulate from EOB to toilet in room with close supervision in order to increase independence with ADLs OT Short Term Goal 3 (Week 2): Pt will complete toilet transfer and 3/3 toileting tasks without need for seated rest break.  OT Short Term Goal 4 (Week 2): Pt will ambulate within room to gather clothing in prep for showering task with min A  Skilled Therapeutic Interventions/Progress Updates:    Pt seen for OT ADL bathing/dressing session. Pt sitting up in w/c upon arrival, voicing desire for showering task and agreeable to tx session. He ambulated into bathroom with rollator and CGA. He bathed with set-up/ supervision seated on 3-1 BSC. He required mod-max A to stand following showering task due to fatigue. He ambulated ~3 ft out of bathroom before needing to sit and rest on rollator, unable to make it entirely to w/c. He completed grooming tasks mod I seated on rollator. Pt dressed seated in w/c, standing with mod A in order to complete LB dressing.  He then ambulated throughout room to gather dirty laundry and straighten room. Pt would ambulate with rollator and supervision in order to gather items and then sit on rollator to complete task. He would self propel rollator with B LEs when transporting items in order manage items as he had done PTA. Pt returned to w/c at end of session, left sitting with all needs in reach.  Cont education regarding energy conservation, safety with rollator, and d/c planning.   Therapy Documentation Precautions:   Precautions Precautions: Fall Restrictions Weight Bearing Restrictions: No Pain:   No/ denies pain ADL: ADL ADL Comments: see Functional Assessment Tool  See Function Navigator for Current Functional Status.   Therapy/Group: Individual Therapy  [name], Bethania Schlotzhauer C 08/03/2016, 7:19 AM

## 2016-08-03 NOTE — Progress Notes (Signed)
Social Work Patient ID: Ethan Rose, male   DOB: 1946-01-10, 70 y.o.   MRN: [id number]   Ethan Curb, LCSW Social Worker Signed   Patient Care Conference Date of Service: 08/03/2016  1:35 PM      Hide copied text Hover for attribution information Inpatient RehabilitationTeam Conference and Plan of Care Update Date: 08/02/2016   Time: 2:05 PM      Patient Name: Ethan Rose      Medical Record Number: [id number]  Date of Birth: 02/19/1946 Sex: Male         Room/Bed: 4M06C/4M06C-01 Payor Info: Payor: MEDICARE / Plan: MEDICARE PART A AND B / Product Type: *No Product type* /     Admitting Diagnosis: sepsis  Admit Date/Time:  07/22/2016  1:26 PM Admission Comments: No comment available    Primary Diagnosis:  Debility Principal Problem: Debility       Patient Active Problem List    Diagnosis Date Noted  . Debility 07/22/2016  . Pressure ulcer 07/18/2016  . E coli bacteremia    . Leukocytosis    . Sepsis ([place]) 07/16/2016  . Severe sepsis with septic shock ([place]) 07/16/2016  . Pyelonephritis 07/16/2016  . Left ureteral stone 07/16/2016  . Septic shock ([place]) 07/16/2016  . Cervical myelopathy ([place]) 03/18/2016  . 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 Other malaise and fatigue 05/14/2014  . Health maintenance examination 04/29/2013  . Weakness of both legs 04/29/2013  . Leg cramps 04/29/2013  . Elevated blood pressure reading without diagnosis of hypertension 03/30/2012  . Dizziness 03/30/2012      Expected Discharge Date: Expected Discharge Date: 08/10/16   Team Members Present: Physician leading conference: Dr. Alger Rose Social Worker Present: Ethan Pall, LCSW Nurse Present: Ethan Roberts, RN PT Present: Ethan Rose, PT OT Present: Ethan Rose, OT SLP Present: Ethan Rose, SLP PPS Coordinator present : Ethan Nakayama, RN, CRRN       Current  Status/Progress Goal Weekly Team Focus  Medical   improving stamina but still with proximal muscle weakness  see prior  nutrition, muscle strengthening   Bowel/Bladder   continent of bowel/bladder. LBM 07/31/16  Patient to remain continent of bowel/bladder while in [facility] with min assist.  Monitor bowel/bladder function q shift and as needed   Swallow/Nutrition/ Hydration             ADL's   Min A transfers (can be mod when fatigued), steadying assist toileting and LB dressing; mod I UB dressing and grooming  Supervision- mod I overall  Activity tolerance, functional transfers, strengthening   Mobility   modI bed mobility, minA transfers, min guard gait x50'   modI bed mobility and transfers, S gait 61' with rollator in home  LE strengthening, endurance, transfer/gait training   Communication             Safety/Cognition/ Behavioral Observations           Pain   no complain of pain  <3   Monitor and treat any pain q shift and as needed   Skin   no new skin skin issue noted  Remain free from new skin breakdown/infection while in [facility] with min assist  Monitor skin q shift and as needed     Rehab Goals Patient on target to meet rehab goals: Yes *See Care Plan and progress notes for long and short-term goals.   Barriers to Discharge:  see prior   Possible Resolutions to Barriers:  orthotic, continued strength training, see prior   Discharge Planning/Teaching Needs:  Plan to d/c home with intermittent assist of family.  May be able to provide 24/7 assist initially.  Teaching to be scheduled closer to d/c.   Team Discussion:  Urology has scheduled OP visit for stone removal.  Making gains but can still be a mod-max assist to stand.  Did much better today with a rollator.  Steady assist with ALDs.  On track for d/c next week.  Revisions to Treatment Plan:  None    Continued Need for Acute Rehabilitation Level of Care: The patient requires daily medical management by a physician with specialized  training in physical medicine and rehabilitation for the following conditions: Daily direction of a multidisciplinary physical rehabilitation program to ensure safe treatment while eliciting the highest outcome that is of practical value to the patient.: Yes Daily medical management of patient stability for increased activity during participation in an intensive rehabilitation regime.: Yes Daily analysis of laboratory values and/or radiology reports with any subsequent need for medication adjustment of medical intervention for : Neurological problems;Urological problems   Ethan Rose 08/03/2016, 1:35 PM

## 2016-08-03 NOTE — Progress Notes (Signed)
Social Work Patient ID: Ethan Rose, male   DOB: [birth year], 70 y.o.   MRN: [id number]  Met with patient yesterday afternoon to review team conference.  He understands that he is on track to meet goals and that team does not feel that an extension of his stay is warranted. We discussed DME and follow up needs.  Will continue to follow.  Tarita Deshmukh, LCSW

## 2016-08-04 ENCOUNTER — Inpatient Hospital Stay (HOSPITAL_COMMUNITY): Payer: Medicare Other | Admitting: Occupational Therapy

## 2016-08-04 ENCOUNTER — Inpatient Hospital Stay (HOSPITAL_COMMUNITY): Payer: Medicare Other | Admitting: Physical Therapy

## 2016-08-04 ENCOUNTER — Inpatient Hospital Stay (HOSPITAL_COMMUNITY): Payer: Medicare Other

## 2016-08-04 LAB — BASIC METABOLIC PANEL
ANION GAP: 10 (ref 5–15)
BUN: 17 mg/dL (ref 6–20)
CHLORIDE: 103 mmol/L (ref 101–111)
CO2: 25 mmol/L (ref 22–32)
CREATININE: 0.5 mg/dL — AB (ref 0.61–1.24)
Calcium: 9 mg/dL (ref 8.9–10.3)
GFR calc non Af Amer: >60 mL/min (ref >60)
Glucose, Bld: 116 mg/dL — ABNORMAL HIGH (ref 65–99)
POTASSIUM: 4 mmol/L (ref 3.5–5.1)
SODIUM: 138 mmol/L (ref 135–145)

## 2016-08-04 LAB — CBC WITH DIFFERENTIAL/PLATELET
Basophils Absolute: 0 10*3/uL (ref 0.0–0.1)
Basophils Relative: 0 %
EOS PCT: 3 %
Eosinophils Absolute: 0.3 10*3/uL (ref 0.0–0.7)
HCT: 36.6 % — ABNORMAL LOW (ref 39.0–52.0)
HEMOGLOBIN: 11.9 g/dL — AB (ref 13.0–17.0)
LYMPHS PCT: 24 %
Lymphs Abs: 2 10*3/uL (ref 0.7–4.0)
MCH: 28.7 pg (ref 26.0–34.0)
MCHC: 32.5 g/dL (ref 30.0–36.0)
MCV: 88.4 fL (ref 78.0–100.0)
MONO ABS: 0.8 10*3/uL (ref 0.1–1.0)
Monocytes Relative: 9 %
NEUTROS PCT: 64 %
Neutro Abs: 5.3 10*3/uL (ref 1.7–7.7)
PLATELETS: 412 10*3/uL — AB (ref 150–400)
RBC: 4.14 MIL/uL — AB (ref 4.22–5.81)
RDW: 14.2 % (ref 11.5–15.5)
WBC: 8.4 10*3/uL (ref 4.0–10.5)

## 2016-08-04 NOTE — Progress Notes (Signed)
Occupational Therapy Session Note  Patient Details  Name: Ethan Rose MRN: [id number] Date of Birth: 10/03/1946  Today's Date: 08/04/2016 OT Individual Time: 1300-1330 OT Individual Time Calculation (min): 30 min   Skilled Therapeutic Interventions/Progress Updates:   1:1 Therapeutic exercise: focus on LE and core strengthening and conditioning with exercises on the mat in sidelying and in supine. Pt performed LE / hip abduction, flexion, extension with A against gravity bilaterally; rolling without use of UEs focusing on core stability and initiation in hip movement, adduction glut squeezes/ sustained contractions. Pt with significant UE compensation with functional mobility so purpose of session was to focus on LE strengthening to A with sit to stands, functional dynamic standing without UE support to increased activity tolerance and overall strengthening for ADL tasks. Left on mat to rest in between therapy.   Therapy Documentation Precautions:  Precautions Precautions: Fall Restrictions Weight Bearing Restrictions: No Pain: Pain Assessment Pain Assessment: No/denies pain  See Function Navigator for Current Functional Status.   Therapy/Group: Individual Therapy  Willeen Cass [facility] 08/04/2016, 3:04 PM

## 2016-08-04 NOTE — Progress Notes (Signed)
Occupational Therapy Note  Patient Details  Name: Ethan Rose MRN: [id number] Date of Birth: Jul 06, 1946  Today's Date: 08/04/2016 OT Individual Time: 1000-1100 OT Individual Time Calculation (min): 60 min    Pt denied pain Individual Therapy  Pt engaged in practicing functional transfers, sit<>stand from elevated surface, and mini-squats from elevated surface.  Pt relies on BUE for sit<>stand and is unable to perform sit<>stand without use of BUE.  Pt fatigues quickly and requires extended rest breaks to recover.  Pt propelled back to room and remained in w/c with all needs within reach.   Leotis Shames [facility] 08/04/2016, 12:32 PM

## 2016-08-04 NOTE — Progress Notes (Signed)
Patient ID: Ethan Rose, male   DOB: [birth year], 70 y.o.   MRN: [id number]  Social visit with mutual patient.  Pt appreciative of care rec'd & agreeable with plan.  Expects d/c next Wed followed by return to [facility] for [employer].

## 2016-08-04 NOTE — Progress Notes (Signed)
Occupational Therapy Session Note  Patient Details  Name: Ethan Rose MRN: [id number] Date of Birth: 1946-06-04  Today's Date: 08/04/2016 OT Individual Time: 0830-0930 OT Individual Time Calculation (min): 60 min     Short Term Goals:Week 2:  OT Short Term Goal 1 (Week 2): Pt will complete one grooming task standing at sink with min A in order to increase functional activity tolerance OT Short Term Goal 2 (Week 2): Pt will ambulate from EOB to toilet in room with close supervision in order to increase independence with ADLs OT Short Term Goal 3 (Week 2): Pt will complete toilet transfer and 3/3 toileting tasks without need for seated rest break.  OT Short Term Goal 4 (Week 2): Pt will ambulate within room to gather clothing in prep for showering task with min A  Skilled Therapeutic Interventions/Progress Updates:    Pt seen for OT ADL bathing/dressing session. Pt sitting up in w/c upon arrival, agreeable to tx session.  Discussed with pt energy conservation and planning tasks according to enregy levels. Completed shower this morning in order to assess his ability to complete shower transfers first thing in the morning rather than after PT session when fatigued.  He ambulated into bathroom with rollator and CGA. Pt with uncontrolled sit onto 3-1 BSC following backing into shower and LEs giving way. Pt unhurt with uncontrolled sit. He bathed seated on 3-1 BSC with set-up and supervision.  Pt requiring mod-max A to stand from [facility] following shower. Trialed hand placement in various positions to assist, however, pt unable to fully stand to rollator without assist.  He dressed seated in w/c, requiring extended rest break and max A to stand from w/c to pull pants up. Groming completed mod I seated in rollator. Completed x2 standing trials (without UE support) at sink, tolerating ~20 seconds each standing trial before requiring seated rest break.  Pt returned to w/c at end of session, left with all  needs in reach. Cont to educate and problem solve regarding positioning and set-up for increased independence at home as pt with intermittent supervision assistance at d/c.    Therapy Documentation Precautions:  Precautions Precautions: Fall Restrictions Weight Bearing Restrictions: No Pain:   No/ denies pain ADL: ADL ADL Comments: see Functional Assessment Tool  See Function Navigator for Current Functional Status.   Therapy/Group: Individual Therapy  [name], Aalia Greulich C 08/04/2016, 7:10 AM

## 2016-08-04 NOTE — Progress Notes (Signed)
[place] PHYSICAL MEDICINE & REHABILITATION     PROGRESS NOTE  Subjective/Complaints:  No new issues. Legs still weak. Denies any bladder symptoms  ROS: Denies CP, SOB, N/V/D.  Objective: Vital Signs: Blood pressure 127/69, pulse 72, temperature 98.1 F (36.7 C), temperature source Oral, resp. rate 18, height 5\' 9"  (1.753 m), weight 63.4 kg (139 lb 11.2 oz), SpO2 99 %. No results found.  Recent Labs  08/04/16 0709  WBC 8.4  HGB 11.9*  HCT 36.6*  PLT 412*   No results for input(s): NA, K, CL, GLUCOSE, BUN, CREATININE, CALCIUM in the last 72 hours.  Invalid input(s): CO CBG (last 3)  No results for input(s): GLUCAP in the last 72 hours.  Wt Readings from Last 3 Encounters:  08/03/16 63.4 kg (139 lb 11.2 oz)  07/22/16 72.5 kg (159 lb 12.8 oz)  04/13/16 72.2 kg (159 lb 3.2 oz)    Physical Exam:  BP 127/69 (BP Location: Right Arm)   Pulse 72   Temp 98.1 F (36.7 C) (Oral)   Resp 18   Ht 5\' 9"  (1.753 m)   Wt 63.4 kg (139 lb 11.2 oz)   SpO2 99%   BMI 20.63 kg/m  Constitutional: He appears well-developed and well-nourished.   HENT: Normocephalic. Atraumatic. Eyes: Conj and EOM intact.  Cardiovascular: Normal rate, regular rhythm and normal heart sounds.   Respiratory: Effort normal, breath sounds normal.  GI: Soft. Nontender. Genitourinary: n/a Musculoskeletal: He exhibits no edema or deformity.  -golf ball sized fluid collection left olecranon Neurological: He is alert and oriented.  Motor 4+/5 proximal to distal in the UE's.  B/l LE: HF 3- to 3/5, KE 3+/5, APF 4/5, ADF remains trace.  Skin: Skin is warm and dry.  Psychiatric: He has a normal mood and affect. His behavior is normal. Judgment and thought content normal.    Assessment/Plan: 1. Functional deficits secondary to debility after multiple medical complications which require 3+ hours per day of interdisciplinary therapy in a comprehensive inpatient rehab setting. Physiatrist is providing close team  supervision and 24 hour management of active medical problems listed below. Physiatrist and rehab team continue to assess barriers to discharge/monitor patient progress toward functional and medical goals.  Function:  Bathing Bathing position   Position: Shower  Bathing parts Body parts bathed by patient: Right arm, Left arm, Chest, Abdomen, Front perineal area, Buttocks, Right upper leg, Left upper leg, Right lower leg, Left lower leg, Back    Bathing assist Assist Level: Supervision or verbal cues   Set up : To obtain items  Upper Body Dressing/Undressing Upper body dressing   What is the patient wearing?: Pull over shirt/dress     Pull over shirt/dress - Perfomed by patient: Thread/unthread right sleeve, Thread/unthread left sleeve, Put head through opening, Pull shirt over trunk          Upper body assist Assist Level: More than reasonable time   Set up : To obtain clothing/put away  Lower Body Dressing/Undressing Lower body dressing   What is the patient wearing?: Pants, Non-skid slipper socks     Pants- Performed by patient: Thread/unthread right pants leg, Thread/unthread left pants leg, Pull pants up/down Pants- Performed by helper: Thread/unthread right pants leg, Thread/unthread left pants leg Non-skid slipper socks- Performed by patient: Don/doff right sock, Don/doff left sock (Sock aid) Non-skid slipper socks- Performed by helper: Don/doff right sock, Don/doff left sock     Shoes - Performed by patient: Don/doff right shoe, Don/doff left shoe, Fasten  right, Fasten left   AFO - Performed by patient: Don/doff right AFO, Don/doff left AFO        Lower body assist Assist for lower body dressing: Touching or steadying assistance (Pt > 75%)      Toileting Toileting Toileting activity did not occur: No continent bowel/bladder event (foley, no BM) Toileting steps completed by patient: Performs perineal hygiene Toileting steps completed by helper: Adjust clothing  prior to toileting, Adjust clothing after toileting Toileting Assistive Devices: Grab bar or rail  Toileting assist Assist level: Touching or steadying assistance (Pt.75%)   Transfers Chair/bed transfer   Chair/bed transfer method: Stand pivot Chair/bed transfer assist level: Touching or steadying assistance (Pt > 75%) Chair/bed transfer assistive device: Armrests, Medical sales representative     Max distance: 50 Assist level: Touching or steadying assistance (Pt > 75%)   Wheelchair   Type: Manual Max wheelchair distance: 250 Assist Level: Supervision or verbal cues  Cognition Comprehension Comprehension assist level: Follows complex conversation/direction with no assist  Expression Expression assist level: Expresses complex ideas: With no assist  Social Interaction Social Interaction assist level: Interacts appropriately with others - No medications needed.  Problem Solving Problem solving assist level: Solves complex problems: Recognizes & self-corrects  Memory Memory assist level: Complete Independence: No helper    Medical Problem List and Plan: 1.  Mobility and functional deficits secondary to debility after multiple medical complications  -continue CIR therapies 2.  DVT Prophylaxis/Anticoagulation: Pharmaceutical: Lovenox 3. Pain Management: tylenol. Pain controlled.  4. Mood: team to provide ego support as necessary. Pt appears motivated. 5. Neuropsych: This patient is capable of making decisions on his own behalf. 6. Skin/Wound Care: encourage nutrition.   -local skin care as necessary 7. Fluids/Electrolytes/Nutrition:  -encourage PO--labs reviewed and all within normal limits 8. E coli bacteremia/UTI: Continue to monitor for temps/symptoms rather that WBC per ID. Bactrim for 10 days---complete  -ucx with insignificant growth  -stone retrieval---after discharge-follow up with urology (10/5)   9. Hypokalemia:resolved 10. Prediabetes: dietary education for  now 11. Protein calorie malnutrition: improving intake 12. 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 He does have a history of cervical stenosis, myelopathy, and subsequent surgery as wel.  -AFO/shoewear functional 13. Left olecranon bursitis---  -continue pressure relief--continue elbow pad  -prn ice  -overall improved LOS (Days) 13 A FACE TO FACE EVALUATION WAS PERFORMED  Anders Hohmann T 08/04/2016 8:46 AM

## 2016-08-04 NOTE — Progress Notes (Signed)
Physical Therapy Session Note  Patient Details  Name: Ethan Rose MRN: [id number] Date of Birth: 28-Dec-1945  Today's Date: 08/04/2016 PT Individual Time: 1400-1500 PT Individual Time Calculation (min): 60 min    Short Term Goals: Week 2:  PT Short Term Goal 1 (Week 2): Pt will ambulate 46' with rollator and min guard PT Short Term Goal 2 (Week 2): Pt will perform stand pivot transfers with min guard PT Short Term Goal 3 (Week 2): Pt will perform bed mobility modI PT Short Term Goal 4 (Week 2): Pt will perform gait up/down ramp with rollator and minA  Skilled Therapeutic Interventions/Progress Updates:   Pt received supine on mat table after previous OT session; denies pain and agreeable to treatment. Bridging with hip abduction light orange theraband, alternating marching, and bridging on stability ball. Seated balance, core strengthening and coordination while seated on stability ball, performing alternating LE long arc quads, marching, rows with light orange theraband. Sit <>stand x5 reps with BUE support on therapist's chair, and facilitation at B glutes. Returned to room in w/c with BUE propulsion; remained seated in w/c at end of session, all needs in reach.   Therapy Documentation Precautions:  Precautions Precautions: Fall Restrictions Weight Bearing Restrictions: No Pain: Pain Assessment Pain Assessment: No/denies pain   See Function Navigator for Current Functional Status.   Therapy/Group: Individual Therapy  Luberta Mutter 08/04/2016, 2:57 PM

## 2016-08-05 ENCOUNTER — Inpatient Hospital Stay (HOSPITAL_COMMUNITY): Payer: Medicare Other | Admitting: Occupational Therapy

## 2016-08-05 ENCOUNTER — Inpatient Hospital Stay (HOSPITAL_COMMUNITY): Payer: Medicare Other | Admitting: Physical Therapy

## 2016-08-05 NOTE — Progress Notes (Signed)
Physical Therapy Weekly Progress Note  Patient Details  Name: Ethan Rose MRN: [id number] Date of Birth: Jul 23, 1946  Beginning of progress report period: July 29, 2016 End of progress report period: August 05, 2016  Today's Date: 08/05/2016 PT Individual Time: 1015-1100 PT Individual Time Calculation (min): 45 min    Patient has met 4 of 4 short term goals.  Pt currently requires modA sit <>stand from low surface, however S from rollator which pt uses primarily in the house. Gait limited to 50-60' at a time before fatigued and requiring seated rest break. Pt ambulating on ramp with minA; required for home entry.  Patient continues to demonstrate the following deficits: impaired activity tolerance, balance, postural control, ability to compensate for deficits and functional use of  right lower extremity and left lower extremity and therefore will continue to benefit from skilled PT intervention to enhance overall performance with bed mobility, transfers, gait, home and community access.   Patient progressing toward long term goals..  Continue plan of care.  PT Short Term Goals Week 2:  PT Short Term Goal 1 (Week 2): Pt will ambulate 39' with rollator and min guard PT Short Term Goal 1 - Progress (Week 2): Met PT Short Term Goal 2 (Week 2): Pt will perform stand pivot transfers with min guard PT Short Term Goal 2 - Progress (Week 2): Met PT Short Term Goal 3 (Week 2): Pt will perform bed mobility modI PT Short Term Goal 3 - Progress (Week 2): Met PT Short Term Goal 4 (Week 2): Pt will perform gait up/down ramp with rollator and minA PT Short Term Goal 4 - Progress (Week 2): Met Week 3:  PT Short Term Goal 1 (Week 3): =LTG due to estimated LOS   Skilled Therapeutic Interventions/Progress Updates:   Pt received seated in w/c with no c/o pain and agreeable to treatment. Gait x5 trials with variable distance 30-50' per trial, shorter distances as fatigued. Seated rest breaks  between trials. Performed mini-squats on tilt table at 40 degree angle and no UE support; performed for LE strengthening to carryover into sit <>stand. Remained seated in w/c at end of session, modI to propel back to room.   Therapy Documentation Precautions:  Precautions Precautions: Fall Restrictions Weight Bearing Restrictions: No   See Function Navigator for Current Functional Status.  Therapy/Group: Individual Therapy  Luberta Mutter 08/05/2016, 10:54 AM

## 2016-08-05 NOTE — Progress Notes (Signed)
Occupational Therapy Weekly Progress Note  Patient Details  Name: LILTON PARE MRN: [id number] Date of Birth: February 26, 1946  Beginning of progress report period: July 29, 2016 End of progress report period: August 05, 2016  Today's Date: 08/05/2016 OT Individual Time: 7544-9201 and 1400-1500  OT Individual Time Calculation (min): 60 min and 60 min   Patient has met 4 of 4 short term goals, though performance fluctuates depending on fatigue.  Pt making slow but steady progress towards OT goals. He cont to be most limited by decreased standing / ambulation tolerance. He has had increased independence with ADLs since switching to rollator as he is able to sit when needed to rest. He requires mod-max A to stand from 3-1 [facility] in shower, elevated toilet and from w/c, especially when fatigued. Cont to plan for d/c home next week.   Patient continues to demonstrate the following deficits: muscle weakness (generalized) and therefore will continue to benefit from skilled OT intervention to enhance overall performance with BADL, iADL and Reduce care partner burden.  Patient progressing toward long term goals..  Continue plan of care.  OT Short Term Goals Week 2:  OT Short Term Goal 1 (Week 2): Pt will complete one grooming task standing at sink with min A in order to increase functional activity tolerance OT Short Term Goal 1 - Progress (Week 2): Met OT Short Term Goal 2 (Week 2): Pt will ambulate from EOB to toilet in room with close supervision in order to increase independence with ADLs OT Short Term Goal 2 - Progress (Week 2): Met OT Short Term Goal 3 (Week 2): Pt will complete toilet transfer and 3/3 toileting tasks without need for seated rest break.  OT Short Term Goal 3 - Progress (Week 2): Met OT Short Term Goal 4 (Week 2): Pt will ambulate within room to gather clothing in prep for showering task with min A OT Short Term Goal 4 - Progress (Week 2): Met Week 3:  OT Short Term Goal 1  (Week 3): STG=LTG due to LOS   Skilled Therapeutic Interventions/Progress Updates:    Session One: Pt seen for OT ADL bathing/dressing session. Pt in supine upon arriva, agreeable to tx session and voicing desire for shower. He required min-mod A to stand from elevated bed. He ambulated throughout room with rollator with CGA to gather clothing items, sitting in rollator to complete task. Required 2 seated restbreaks when ambulating into bathroom to complete shower stall transfer to 3-1 BSC. He bathed with set-up/ supervision using LH sponge. He required mod  A to stand to exit shower and seated rest break on rollator required when exiting bathroom. He dressed seated from rollator, able to stand with supervision to pull pants up- an increase in independence from previous sessions.  He completed grooming tasks at sink, able to stand to brush hair before requiring seated rest break. Pt returned to w/c to don shoes and AFOs, propping foot on step stool for easier access. Pt left sitting in w/c at end of session, all needs in reach and eating breakfast.  Cont to discuss and problem solve pt's home bathroom set-up and ensuring pt is able to stand from 3-1 when exiting shower and fatigued. Pt's family members to get home measurements of toilet height and shower chair height in order to ensure pt can stand from DME at home.   Session Two: Pt seen for OT session focusing on function mobility and problem solving. Pt in supine upon arrival, voicing need for  toileting task and agreeable to tx session. He ambulated into bathroom using rollator with CGA. Pt able to ambulate entire way into bathroom without need for seated rest break, improvements from previous sessions. Required max A to stand from [facility] over toilet.  Discussed at length with pt possibility of using w/c at home due to his decreased activity tolerance and hx of knees buckling without notice. Educated regarding use of w/c in order to allow for more energy  for meaningful tasks and not to require as many rest breaks throughout the day Pt more agreeable today when discussing this compared to previous sessions. Trialed pt in 16x16 chair in order for chair to better maneuver around house. Pt still with difficulty standing from standard w/c, requiring mod-max A to stand to rollator. Pt able to complete functional transfers via squat pivot from chair with supervision and discussed options of doing this at home for most transfer needs. In therapy gym, pt completed x2 sets of 10 chest press, overhead press, and bicep curls using #4 dowel rod while therapist retrieved w/c.  Pt returned to room at end of session, squat pivot completed to bed and pt left in supine with all needs in reach.    Therapy Documentation Precautions:  Precautions Precautions: Fall Restrictions Weight Bearing Restrictions: No Pain: Pain Assessment Pain Assessment: No/denies pain ADL: ADL ADL Comments: see Functional Assessment Tool  See Function Navigator for Current Functional Status.   Therapy/Group: Individual Therapy  [name], Giuseppina Quinones C 08/05/2016, 7:09 AM

## 2016-08-05 NOTE — Progress Notes (Signed)
Physical Therapy Session Note  Patient Details  Name: Ethan Rose MRN: [id number] Date of Birth: June 18, 1946  Today's Date: 08/05/2016 PT Individual Time: 0902-0930 PT Individual Time Calculation (min): 28 min    Short Term Goals: Week 2:  PT Short Term Goal 1 (Week 2): Pt will ambulate 74' with rollator and min guard PT Short Term Goal 1 - Progress (Week 2): Met PT Short Term Goal 2 (Week 2): Pt will perform stand pivot transfers with min guard PT Short Term Goal 2 - Progress (Week 2): Met PT Short Term Goal 3 (Week 2): Pt will perform bed mobility modI PT Short Term Goal 3 - Progress (Week 2): Met PT Short Term Goal 4 (Week 2): Pt will perform gait up/down ramp with rollator and minA PT Short Term Goal 4 - Progress (Week 2): Met Week 3:  PT Short Term Goal 1 (Week 3): =LTG due to estimated LOS  Skilled Therapeutic Interventions/Progress Updates:     Patient received sitting in WC and agreeable to PT.   WC mobility training in hall for 18f x 2 without cues or assistance from PT. But patient noted to take increased time with doorway management and WC parts management proior to transfer.   Lateral scoot to and from Nustep with supervision Assist from PT with cues to UE placement.   Nustep, level 2>5, x 9 minutes for BLE and BUE strengthening and endurance level 3>5 with min cues for imrpoved hip control to prevent excessive hip IR/ER.   PT instructed patient in gait on handicap access ramp x15 ft for ascent with min A and use of rollator. Mod cues for proper step through gait pattern and AD management onto ramp and sitting down for rest break at top of ramp. [facility]<>[facility] with min A through PT treatment x 3.   Patient left sitting in WC with call bell in reach.          Therapy Documentation Precautions:  Precautions Precautions: Fall Restrictions Weight Bearing Restrictions: No Pain: Pain Assessment Pain Assessment:  No/denies pain   See Function Navigator for Current Functional Status.   Therapy/Group: Individual Therapy  ALorie Phenix9/22/2017, 5:39 PM

## 2016-08-05 NOTE — Progress Notes (Signed)
[place] PHYSICAL MEDICINE & REHABILITATION     PROGRESS NOTE  Subjective/Complaints:  Getting stronger. Still tired at end of the therapy day. Had questions about outpt follow up.   ROS: Denies CP, SOB, N/V/D.  Objective: Vital Signs: Blood pressure 139/69, pulse 75, temperature 98.3 F (36.8 C), temperature source Oral, resp. rate 18, height 5\' 9"  (1.753 m), weight 63.4 kg (139 lb 11.2 oz), SpO2 98 %. No results found.  Recent Labs  08/04/16 0709  WBC 8.4  HGB 11.9*  HCT 36.6*  PLT 412*    Recent Labs  08/04/16 0709  NA 138  K 4.0  CL 103  GLUCOSE 116*  BUN 17  CREATININE 0.50*  CALCIUM 9.0   CBG (last 3)  No results for input(s): GLUCAP in the last 72 hours.  Wt Readings from Last 3 Encounters:  08/03/16 63.4 kg (139 lb 11.2 oz)  07/22/16 72.5 kg (159 lb 12.8 oz)  04/13/16 72.2 kg (159 lb 3.2 oz)    Physical Exam:  BP 139/69 (BP Location: Right Arm)   Pulse 75   Temp 98.3 F (36.8 C) (Oral)   Resp 18   Ht 5\' 9"  (1.753 m)   Wt 63.4 kg (139 lb 11.2 oz)   SpO2 98%   BMI 20.63 kg/m  Constitutional: He appears well-developed and well-nourished.   HENT: Normocephalic. Atraumatic. Eyes: Conj and EOM intact.  Cardiovascular: Normal rate, regular rhythm and normal heart sounds.   Respiratory: Effort normal, breath sounds normal.  GI: Soft. Nontender. Genitourinary: n/a Musculoskeletal: He exhibits no edema or deformity.  -golf ball sized fluid collection left olecranon Neurological: He is alert and oriented.  Motor 4+/5 proximal to distal in the UE's.  B/l LE: HF 3- to 3/5, KE 3+/5, APF 4/5, ADF remains trace.  Skin: Skin is warm and dry.  Psychiatric: He has a normal mood and affect. His behavior is normal. Judgment and thought content normal.    Assessment/Plan: 1. Functional deficits secondary to debility after multiple medical complications which require 3+ hours per day of interdisciplinary therapy in a comprehensive inpatient rehab  setting. Physiatrist is providing close team supervision and 24 hour management of active medical problems listed below. Physiatrist and rehab team continue to assess barriers to discharge/monitor patient progress toward functional and medical goals.  Function:  Bathing Bathing position   Position: Shower  Bathing parts Body parts bathed by patient: Right arm, Left arm, Chest, Abdomen, Front perineal area, Buttocks, Right upper leg, Left upper leg, Right lower leg, Left lower leg, Back    Bathing assist Assist Level: Set up   Set up : To obtain items  Upper Body Dressing/Undressing Upper body dressing   What is the patient wearing?: Pull over shirt/dress     Pull over shirt/dress - Perfomed by patient: Thread/unthread right sleeve, Thread/unthread left sleeve, Put head through opening, Pull shirt over trunk          Upper body assist Assist Level: More than reasonable time   Set up : To obtain clothing/put away  Lower Body Dressing/Undressing Lower body dressing   What is the patient wearing?: Pants, Non-skid slipper socks     Pants- Performed by patient: Thread/unthread right pants leg, Thread/unthread left pants leg, Pull pants up/down Pants- Performed by helper: Thread/unthread right pants leg, Thread/unthread left pants leg Non-skid slipper socks- Performed by patient: Don/doff right sock, Don/doff left sock Non-skid slipper socks- Performed by helper: Don/doff right sock, Don/doff left sock  Shoes - Performed by patient: Don/doff right shoe, Don/doff left shoe, Fasten right, Fasten left   AFO - Performed by patient: Don/doff right AFO, Don/doff left AFO        Lower body assist Assist for lower body dressing: Supervision or verbal cues      Toileting Toileting Toileting activity did not occur: No continent bowel/bladder event (foley, no BM) Toileting steps completed by patient: Performs perineal hygiene Toileting steps completed by helper: Adjust clothing  prior to toileting, Adjust clothing after toileting Toileting Assistive Devices: Grab bar or rail  Toileting assist Assist level: Touching or steadying assistance (Pt.75%)   Transfers Chair/bed transfer   Chair/bed transfer method: Squat pivot Chair/bed transfer assist level: Supervision or verbal cues Chair/bed transfer assistive device: Armrests     Locomotion Ambulation     Max distance: 50 Assist level: Touching or steadying assistance (Pt > 75%)   Wheelchair   Type: Manual Max wheelchair distance: 250 Assist Level: Supervision or verbal cues  Cognition Comprehension Comprehension assist level: Follows complex conversation/direction with no assist  Expression Expression assist level: Expresses complex ideas: With no assist  Social Interaction Social Interaction assist level: Interacts appropriately with others - No medications needed.  Problem Solving Problem solving assist level: Solves complex problems: Recognizes & self-corrects  Memory Memory assist level: Complete Independence: No helper    Medical Problem List and Plan: 1.  Mobility and functional deficits secondary to debility after multiple medical complications  -continue CIR therapies 2.  DVT Prophylaxis/Anticoagulation: Pharmaceutical: Lovenox 3. Pain Management: tylenol. Pain controlled.  4. Mood: team to provide ego support as necessary. Pt appears motivated. 5. Neuropsych: This patient is capable of making decisions on his own behalf. 6. Skin/Wound Care: encourage nutrition.   -local skin care as necessary 7. Fluids/Electrolytes/Nutrition:  -encourage PO 8. E coli bacteremia/UTI: Continue to monitor for temps/symptoms rather that WBC per ID. Bactrim for 10 days---complete  -ucx with insignificant growth  -stone retrieval---after discharge-follow up with urology (10/5)   9. Hypokalemia:resolved 10. Prediabetes: dietary education for now 11. Protein calorie malnutrition: improving intake 12. Bilateral  foot drop: appears to be related to his lumbar  Scoliosis/stenosis/radiculopathy although records seem a bit inconsistent in reference to his bilateral foot drop. He does have a history of cervical stenosis, myelopathy, and subsequent surgery as wel.  -AFO/shoewear functional 13. Left olecranon bursitis---  -continue pressure relief--continue elbow pad  -prn ice  -overall improved LOS (Days) 14 A FACE TO FACE EVALUATION WAS PERFORMED  Ethan Rose 08/05/2016 9:06 AM

## 2016-08-06 ENCOUNTER — Ambulatory Visit (HOSPITAL_COMMUNITY): Payer: Medicare Other | Admitting: Physical Therapy

## 2016-08-06 NOTE — Progress Notes (Signed)
Physical Therapy Session Note  Patient Details  Name: Ethan Rose MRN: [id number] Date of Birth: 1946/09/20  Today's Date: 08/06/2016 PT Group Time:1500-1545 PT group time Calculation: 45 min      Short Term Goals: Week 2:  PT Short Term Goal 1 (Week 2): Pt will ambulate 51' with rollator and min guard PT Short Term Goal 1 - Progress (Week 2): Met PT Short Term Goal 2 (Week 2): Pt will perform stand pivot transfers with min guard PT Short Term Goal 2 - Progress (Week 2): Met PT Short Term Goal 3 (Week 2): Pt will perform bed mobility modI PT Short Term Goal 3 - Progress (Week 2): Met PT Short Term Goal 4 (Week 2): Pt will perform gait up/down ramp with rollator and minA PT Short Term Goal 4 - Progress (Week 2): Met Week 3:  PT Short Term Goal 1 (Week 3): =LTG due to estimated LOS  Skilled Therapeutic Interventions/Progress Updates:     Paitent instructed in WC mobility for 110f x 2  without cues or assist from PT but noted to require increased time with door way management.   Gait training to weave through 6 cones x 2 with rest break between bouts with min A from PT and rollator.   stair training on 3 inch step x 3 with min progressing to mod Assist on last step.mod cues for proper UE placement and improved posture.   Gait on level surface x 541fwith Rollator and min A from PT. Cues for improved step length and increased participation to improve endurance.      Therapy Documentation Precautions:  Precautions Precautions: Fall Restrictions Weight Bearing Restrictions: No General:   Vital Signs: Therapy Vitals Temp: 98.2 F (36.8 C) Temp Source: Oral Pulse Rate: 65 Resp: 16 BP: 138/69 Patient Position (if appropriate): Lying Oxygen Therapy SpO2: 98 % O2 Device: Not Delivered   Therapy/Group: Group Therapy  AuLorie Phenix/23/2017, 3:36 PM

## 2016-08-06 NOTE — Progress Notes (Signed)
Ethan Rose is a 70 y.o. male Apr 07, 1946 [id number]  Subjective: No new complaints. C/o weak legs. No new problems. Slept well. Feeling OK.  Objective: Vital signs in last 24 hours: Temp:  [98.4 F (36.9 C)-98.6 F (37 C)] 98.4 F (36.9 C) (09/23 0522) Pulse Rate:  [67-81] 67 (09/23 0522) Resp:  [16-18] 16 (09/23 0522) BP: (122-132)/(57-70) 132/70 (09/23 0522) SpO2:  [97 %-99 %] 97 % (09/23 0522) Weight change:  Last BM Date: 08/06/16  Intake/Output from previous day: 09/22 0701 - 09/23 0700 In: 960 [P.O.:960] Out: 950 [Urine:950] Last cbgs: CBG (last 3)  No results for input(s): GLUCAP in the last 72 hours.   Physical Exam General: No apparent distress   HEENT: not dry Lungs: Normal effort. Lungs clear to auscultation, no crackles or wheezes. Cardiovascular: Regular rate and rhythm, no edema Abdomen: S/NT/ND; BS(+) Musculoskeletal:  unchanged Neurological: No new neurological deficits Wounds: N/A    Skin: clear  Aging changes Mental state: Alert, oriented, cooperative Eating breakfast    Lab Results: BMET    Component Value Date/Time   NA 138 08/04/2016 0709   NA 141 03/23/2016   K 4.0 08/04/2016 0709   CL 103 08/04/2016 0709   CO2 25 08/04/2016 0709   GLUCOSE 116 (H) 08/04/2016 0709   BUN 17 08/04/2016 0709   BUN 14 03/23/2016   CREATININE 0.50 (L) 08/04/2016 0709   CREATININE 0.77 03/30/2012 1537   CALCIUM 9.0 08/04/2016 0709   GFRNONAA >60 08/04/2016 0709   GFRAA >60 08/04/2016 0709   CBC    Component Value Date/Time   WBC 8.4 08/04/2016 0709   RBC 4.14 (L) 08/04/2016 0709   HGB 11.9 (L) 08/04/2016 0709   HCT 36.6 (L) 08/04/2016 0709   PLT 412 (H) 08/04/2016 0709   MCV 88.4 08/04/2016 0709   MCH 28.7 08/04/2016 0709   MCHC 32.5 08/04/2016 0709   RDW 14.2 08/04/2016 0709   LYMPHSABS 2.0 08/04/2016 0709   MONOABS 0.8 08/04/2016 0709   EOSABS 0.3 08/04/2016 0709   BASOSABS 0.0 08/04/2016 0709    Studies/Results: No results  found.  Medications: I have reviewed the patient's current medications.  Assessment/Plan:  1. Mobility and functional deficitssecondary to debility after multiple medical complications             -continue CIR therapies 2. DVT Prophylaxis/Anticoagulation: Pharmaceutical: Lovenox 3. Pain Management: tylenol. Pain controlled.  4. Mood: team to provide ego support as necessary. Pt appears motivated. 5. Neuropsych: This patient iscapable of making decisions on hisown behalf. 6. Skin/Wound Care: encourage nutrition.              -local skin care as necessary 7. Fluids/Electrolytes/Nutrition:             -encourage PO 8. E coli bacteremia/UTI: Continue to monitor for temps/symptoms rather that WBC per ID. Bactrim for 10 days---complete             -ucx with insignificant growth             -stone retrieval---after discharge-follow up with urology (10/5)   9. Hypokalemia:resolved 10. Prediabetes: dietary education for now 11. Protein calorie malnutrition: improving intake 12. Bilateral foot drop: appears to be related to his lumbar Scoliosis/stenosis/radiculopathy although records seem a bit inconsistent in reference to his bilateral foot drop. He does have a history of cervical stenosis, myelopathy, and subsequent surgery as wel.             -AFO/shoewear functional 13. Left olecranon bursitis---             -  continue pressure relief--continue elbow pad             -prn ice             -overall improved LOS (Days) 14    Length of stay, days: 15  Walker Kehr , MD 08/06/2016, 12:43 PM

## 2016-08-07 ENCOUNTER — Inpatient Hospital Stay (HOSPITAL_COMMUNITY): Payer: Medicare Other | Admitting: Physical Therapy

## 2016-08-07 NOTE — Progress Notes (Signed)
Ethan Rose is a 70 y.o. male 09/14/1946 [id number]  Subjective: No change - weak legs. No new problems. Slept well. Feeling OK.  Objective: Vital signs in last 24 hours: Temp:  [98.2 F (36.8 C)-98.5 F (36.9 C)] 98.5 F (36.9 C) (09/24 0557) Pulse Rate:  [65-66] 66 (09/24 0557) Resp:  [16] 16 (09/24 0557) BP: (133-138)/(69-70) 133/70 (09/24 0557) SpO2:  [98 %] 98 % (09/24 0557) Weight change:  Last BM Date: 08/06/16  Intake/Output from previous day: 09/23 0701 - 09/24 0700 In: 720 [P.O.:720] Out: 750 [Urine:750] Last cbgs: CBG (last 3)  No results for input(s): GLUCAP in the last 72 hours.   Physical Exam General: No apparent distress   HEENT: not dry Lungs: Normal effort. Lungs clear to auscultation, no crackles or wheezes. Cardiovascular: Regular rate and rhythm, no edema Abdomen: S/NT/ND; BS(+) Musculoskeletal:  unchanged Neurological: No new neurological deficits Wounds: N/A    Skin: clear  Aging changes Mental state: Alert, oriented, cooperative Resting   Lab Results: BMET    Component Value Date/Time   NA 138 08/04/2016 0709   NA 141 03/23/2016   K 4.0 08/04/2016 0709   CL 103 08/04/2016 0709   CO2 25 08/04/2016 0709   GLUCOSE 116 (H) 08/04/2016 0709   BUN 17 08/04/2016 0709   BUN 14 03/23/2016   CREATININE 0.50 (L) 08/04/2016 0709   CREATININE 0.77 03/30/2012 1537   CALCIUM 9.0 08/04/2016 0709   GFRNONAA >60 08/04/2016 0709   GFRAA >60 08/04/2016 0709   CBC    Component Value Date/Time   WBC 8.4 08/04/2016 0709   RBC 4.14 (L) 08/04/2016 0709   HGB 11.9 (L) 08/04/2016 0709   HCT 36.6 (L) 08/04/2016 0709   PLT 412 (H) 08/04/2016 0709   MCV 88.4 08/04/2016 0709   MCH 28.7 08/04/2016 0709   MCHC 32.5 08/04/2016 0709   RDW 14.2 08/04/2016 0709   LYMPHSABS 2.0 08/04/2016 0709   MONOABS 0.8 08/04/2016 0709   EOSABS 0.3 08/04/2016 0709   BASOSABS 0.0 08/04/2016 0709    Studies/Results: No results found.  Medications: I have reviewed  the patient's current medications.  Assessment/Plan:  1. Mobility and functional deficitssecondary to debility after multiple medical complications             -continue CIR therapies 2. DVT Prophylaxis/Anticoagulation: Pharmaceutical: Lovenox 3. Pain Management: tylenol. Pain controlled.  4. Mood: team to provide ego support as necessary. Pt appears motivated. 5. Neuropsych: This patient iscapable of making decisions on hisown behalf. 6. Skin/Wound Care: encourage nutrition.              -local skin care as necessary 7. Fluids/Electrolytes/Nutrition:             -encourage PO 8. E coli bacteremia/UTI: Continue to monitor for temps/symptoms rather that WBC per ID. Bactrim for 10 days---complete             -ucx with insignificant growth             -stone retrieval---after discharge-follow up with urology (10/5)   9. Hypokalemia:resolved 10. Prediabetes: dietary education for now 11. Protein calorie malnutrition: improving intake 12. Bilateral foot drop: appears to be related to his lumbar Scoliosis/stenosis/radiculopathy although records seem a bit inconsistent in reference to his bilateral foot drop. He does have a history of cervical stenosis, myelopathy, and subsequent surgery as wel.             -AFO/shoewear functional 13. Left olecranon bursitis---             -  continue pressure relief--continue elbow pad             -prn ice             -overall improved LOS (Days) 14  Cont current Rx. Good nutrition discussed    Length of stay, days: [place] , MD 08/07/2016, 12:31 PM

## 2016-08-07 NOTE — Progress Notes (Signed)
Physical Therapy Session Note  Patient Details  Name: Ethan Rose MRN: [id number] Date of Birth: 1946-04-01  Today's Date: 08/07/2016 PT Concurrent Time: 1435-1515 PT Concurrent Time Calculation (min): 40 min   Short Term Goals:Week 3:  PT Short Term Goal 1 (Week 3): =LTG due to estimated LOS  Skilled Therapeutic Interventions/Progress Updates:  Pt seen for concurrent treatment.  Pt agreeable to tx, denying c/o pain. Pt completed lateral scoot with mod assist w/c>nu-step; utilized nu-step level 6 x 12 minutes for BLE strengthening & overall cardiopulmonary endurance training. During activity pt reported 15 on Borg RPE scale & required significant seated rest break afterwards. Pt returned to w/c via lateral scoot with min assist. Pt with B AFO's donned & attempted sit<>Stand transfers. Instructed pt to scoot out to edge of seat and increase anterior weight shift; attempted to place ball between knees to prevent BLE adduction but pt unable to stand with max assist +1. Instructed pt on importance of anterior weight shift & pt then able to achieve sit>stand with max assist + 2. Pt completed a second sit<>stand transfer fading to only max assist + 1 due to improved anterior weight shift; therapist provided max assist for eccentric control for stand>sit. Pt demonstrates difficulty transitioning B hands from w/c to rollator 2/2 BLE weakness. Pt propelled w/c back to room & was left in room with family present.  Therapy Documentation Precautions:  Precautions Precautions: Fall Restrictions Weight Bearing Restrictions: No   See Function Navigator for Current Functional Status.   Therapy/Group: Concurrent  [name] 08/07/2016, 5:29 PM

## 2016-08-08 ENCOUNTER — Inpatient Hospital Stay (HOSPITAL_COMMUNITY): Payer: Medicare Other | Admitting: Physical Therapy

## 2016-08-08 ENCOUNTER — Inpatient Hospital Stay (HOSPITAL_COMMUNITY): Payer: Medicare Other | Admitting: Occupational Therapy

## 2016-08-08 NOTE — Plan of Care (Signed)
Problem: RH PAIN MANAGEMENT Goal: RH STG PAIN MANAGED AT OR BELOW PT'S PAIN GOAL <4  Outcome: Progressing No c/o pain     

## 2016-08-08 NOTE — Progress Notes (Signed)
Occupational Therapy Session Note  Patient Details  Name: Ethan Rose MRN: [id number] Date of Birth: 10-22-1946  Today's Date: 08/08/2016 OT Individual Time: 1110-1210 OT Individual Time Calculation (min): 60 min    Skilled Therapeutic Interventions/Progress Updates:   Patient c/o fatigue as he stated he'd just finished PT and bilateral LEs were generally weak and now very fatigued and  he was not sure how well his transfer or sit to stand would be.   He was winded and took 3 five minute rest breaks after a transfer and a sit to stand, but otherwise, kept moving.  Overall he performed as follows:  W/c to/fr drop arm commode to room shower via grab bars with close S and bilateral feet planted with knees at 90 degrees to stabilize the transfer  Shower UB bathing= set up of soap and wash cloth;   LB bathing = Supervision with lateral leans for periarea and he utilzed his long handled sponge to wash feet and wrap dry towels around it to dry off  UB dressing supervision;    LB dressing= Min A for balance after he completed sit to stand and then utilzed one hand to self support on sink and the other hand topull up pants.    He exhibited no knee buckling or loss of balance (though he stated he wanted to try to stand for pulling up pants but was afraid his legs were too weak and fatigued  Patient was left eating his lunch in his w/c with call bell and phone within reach.  Therapy Documentation Precautions:  Precautions Precautions: Fall Restrictions Weight Bearing Restrictions: No   Pain:denied    See Function Navigator for Current Functional Status.   Therapy/Group: Individual Therapy  [name] [facility] 08/08/2016, 1:19 PM

## 2016-08-08 NOTE — Progress Notes (Signed)
[place] PHYSICAL MEDICINE & REHABILITATION     PROGRESS NOTE  Subjective/Complaints:  Getting stronger. Still tired at end of the therapy day. Had questions about outpt follow up.   ROS: Denies CP, SOB, N/V/D.  Objective: Vital Signs: Blood pressure 132/69, pulse 62, temperature 98.2 F (36.8 C), temperature source Oral, resp. rate 16, height 5\' 9"  (1.753 m), weight 63.4 kg (139 lb 11.2 oz), SpO2 98 %. No results found. No results for input(s): WBC, HGB, HCT, PLT in the last 72 hours. No results for input(s): NA, K, CL, GLUCOSE, BUN, CREATININE, CALCIUM in the last 72 hours.  Invalid input(s): CO CBG (last 3)  No results for input(s): GLUCAP in the last 72 hours.  Wt Readings from Last 3 Encounters:  08/03/16 63.4 kg (139 lb 11.2 oz)  07/22/16 72.5 kg (159 lb 12.8 oz)  04/13/16 72.2 kg (159 lb 3.2 oz)    Physical Exam:  BP 132/69 (BP Location: Right Arm)   Pulse 62   Temp 98.2 F (36.8 C) (Oral)   Resp 16   Ht 5\' 9"  (1.753 m)   Wt 63.4 kg (139 lb 11.2 oz)   SpO2 98%   BMI 20.63 kg/m  Constitutional: He appears well-developed and well-nourished.   HENT: Normocephalic. Atraumatic. Eyes: Conj and EOM intact.  Cardiovascular: Normal rate, regular rhythm and normal heart sounds.   Respiratory: Effort normal, breath sounds normal.  GI: Soft. Nontender. Genitourinary: n/a Musculoskeletal: He exhibits no edema or deformity.  -golf ball sized fluid collection left olecranon Neurological: He is alert and oriented.  Motor 4+/5 proximal to distal in the UE's.  B/l LE: HF 3- to 3/5, KE 3+/5, APF 4/5, ADF remains trace.  Skin: Skin is warm and dry.  Psychiatric: He has a normal mood and affect. His behavior is normal. Judgment and thought content normal.    Assessment/Plan: 1. Functional deficits secondary to debility after multiple medical complications which require 3+ hours per day of interdisciplinary therapy in a comprehensive inpatient rehab setting. Physiatrist is  providing close team supervision and 24 hour management of active medical problems listed below. Physiatrist and rehab team continue to assess barriers to discharge/monitor patient progress toward functional and medical goals.  Function:  Bathing Bathing position   Position: Shower  Bathing parts Body parts bathed by patient: Right arm, Left arm, Chest, Abdomen, Front perineal area, Buttocks, Right upper leg, Left upper leg, Right lower leg, Left lower leg, Back    Bathing assist Assist Level: Set up   Set up : To obtain items  Upper Body Dressing/Undressing Upper body dressing   What is the patient wearing?: Pull over shirt/dress     Pull over shirt/dress - Perfomed by patient: Thread/unthread right sleeve, Thread/unthread left sleeve, Put head through opening, Pull shirt over trunk          Upper body assist Assist Level: More than reasonable time   Set up : To obtain clothing/put away  Lower Body Dressing/Undressing Lower body dressing   What is the patient wearing?: Pants, Non-skid slipper socks     Pants- Performed by patient: Thread/unthread right pants leg, Thread/unthread left pants leg, Pull pants up/down Pants- Performed by helper: Thread/unthread right pants leg, Thread/unthread left pants leg Non-skid slipper socks- Performed by patient: Don/doff right sock, Don/doff left sock Non-skid slipper socks- Performed by helper: Don/doff right sock, Don/doff left sock     Shoes - Performed by patient: Don/doff right shoe, Don/doff left shoe, Fasten right, Fasten left  AFO - Performed by patient: Don/doff right AFO, Don/doff left AFO        Lower body assist Assist for lower body dressing: Supervision or verbal cues      Toileting Toileting Toileting activity did not occur: No continent bowel/bladder event (foley, no BM) Toileting steps completed by patient: Performs perineal hygiene Toileting steps completed by helper: Adjust clothing prior to toileting, Adjust  clothing after toileting Toileting Assistive Devices: Grab bar or rail  Toileting assist Assist level: Touching or steadying assistance (Pt.75%)   Transfers Chair/bed transfer   Chair/bed transfer method: Lateral scoot Chair/bed transfer assist level: Touching or steadying assistance (Pt > 75%) Chair/bed transfer assistive device: Armrests     Locomotion Ambulation     Max distance: 60ft on ramp  Assist level: Touching or steadying assistance (Pt > 75%)   Wheelchair   Type: Manual Max wheelchair distance: 135ft  Assist Level: No help, No cues, assistive device, takes more than reasonable amount of time  Cognition Comprehension Comprehension assist level: Follows complex conversation/direction with no assist  Expression Expression assist level: Expresses complex ideas: With no assist  Social Interaction Social Interaction assist level: Interacts appropriately with others - No medications needed.  Problem Solving Problem solving assist level: Solves complex problems: Recognizes & self-corrects  Memory Memory assist level: Complete Independence: No helper    Medical Problem List and Plan: 1.  Mobility and functional deficits secondary to debility after multiple medical complications  -continue CIR therapies  -reviewed recovery process and need for extended therapy after discharge 2.  DVT Prophylaxis/Anticoagulation: Pharmaceutical: Lovenox 3. Pain Management: tylenol. Pain controlled.  4. Mood: team to provide ego support as necessary. Pt appears motivated. 5. Neuropsych: This patient is capable of making decisions on his own behalf. 6. Skin/Wound Care: encourage nutrition.   -local skin care as necessary 7. Fluids/Electrolytes/Nutrition:  -encourage PO 8. E coli bacteremia/UTI: Continue to monitor for temps/symptoms rather that WBC per ID. Bactrim for 10 days---complete     -stone retrieval after discharge-follow up with urology (10/5)    -encouraged relative fluid rationing  to decrease HS urination  -also needs to stand to empty 9. Hypokalemia:resolved 10. Prediabetes: dietary education for now 11. Protein calorie malnutrition: improving intake 12. 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 He does have a history of cervical stenosis, myelopathy, and subsequent surgery as wel.  -AFO/shoewear functional 13. Left olecranon bursitis---  -continue pressure relief--continue elbow pad  -prn ice  -overall improved LOS (Days) 17 A FACE TO FACE EVALUATION WAS PERFORMED  SWARTZ,ZACHARY T 08/08/2016 8:59 AM

## 2016-08-08 NOTE — Progress Notes (Signed)
Physical Therapy Session Note  Patient Details  Name: Ethan Rose MRN: [id number] Date of Birth: [birth year]  Today's Date: 08/08/2016 PT Individual Time: 1000-1100 and 1510-1540 PT Individual Time Calculation (min): 60 min and 30 min (total 90 min)    Short Term Goals: Week 3:  PT Short Term Goal 1 (Week 3): =LTG due to estimated LOS  Skilled Therapeutic Interventions/Progress Updates:   Tx 1: Pt received seated in w/c, denies pain and agreeable to treatment. Shoes/AFOs donned totalA for time management. W/c propulsion to gym with BUE x180 modI for UE strengthening and endurance. Squat pivot transfer w/c <>nustep with S. Nustep level 5 x10 min with BUE/BLE for extremity strengthening and endurance. Sit <>stand from elevated mat table at 25" to simulate depressed bed height, 3x5 reps with UE support on rollator. Gait x61', x55' and x40' with rollator and S; seated rest breaks on rollator seat. Decreased gait distance over time d/t fatigue. Remained seated in w/c at end of session, all needs in reach.   Tx 2: Pt received seated in w/c, denies pain and agreeable to treatment. Shoes/afo's donned totalA for energy conservation. Sit >stand attempted from w/c however pt unable to perform without significant therapist help mod>maxA. Transfer w/c>bed at 26" height to simulate bed height at home; performed modI. Discussed need for transfer w/c >bed in order to perform sit >stand before ambulating, or would need to use w/c otherwise. Discussed in detail pt's HEP, progression for reps/sets, and importance of performing consistently to increase strength and activity tolerance to allow for improved mobility and independence with gait around the house. Transferred bed>w/c with modI. Trial of sit >stand with pillow under cushion to raise seat height; requires minA however poor LE positioning in internal rotation when sitting due to size/compliance of pillow. Suggested pt use thick piece of foam under cushion to  elevate seat height. Remained seated in w/c at end of session, all needs in reach.   Therapy Documentation Precautions:  Precautions Precautions: Fall Restrictions Weight Bearing Restrictions: No   See Function Navigator for Current Functional Status.   Therapy/Group: Individual Therapy  Luberta Mutter 08/08/2016, 10:58 AM

## 2016-08-08 NOTE — Progress Notes (Signed)
Occupational Therapy Session Note  Patient Details  Name: Ethan Rose MRN: [id number] Date of Birth: 1946-10-08  Today's Date: 08/08/2016 OT Individual Time: 1400-1430 OT Individual Time Calculation (min): 30 min     Short Term Goals:Week 3:  OT Short Term Goal 1 (Week 3): STG=LTG due to LOS  Skilled Therapeutic Interventions/Progress Updates:    Pt seen for OT session focusing on problem solving for home transfers. Pt sitting up in w/c upon arrival, agreeable to tx session.  Pt has home measurements for BSC height in shower and over toilet- 21". Pt currently unable to stand from this low of height consistently. Therefore, problem solving other methods for toilet transfers including squat pivot transfer. Pt with standard BSC (not drop arm). Attempted to complete simulated transfer with w/c armrest in place, however, pt unable to come up and over armrest safely. Will check with CSW regarding possible ordering of chair though he had one ordered ~1 year ago.  Pt with measurement of 26" of bed height. Completed squat pivot from w/c to mat set at 26", completed with close supervision, and return to w/c mod I. Discussed with pt possiblity that he is only able to complete sit <> stand from EOB or rollator at home and do squat pivots for remainder of w/c.  Pt self propelled w/c throughout unit during session mod I for UE strengthening/ endurance.  Pt returned to room at end of session, all needs in reach.   Therapy Documentation Precautions:  Precautions Precautions: Fall Restrictions Weight Bearing Restrictions: No Pain:   No/ denies pain ADL: ADL ADL Comments: see Functional Assessment Tool  See Function Navigator for Current Functional Status.   Therapy/Group: Individual Therapy  [name], Marvina Danner C 08/08/2016, 7:20 AM

## 2016-08-08 NOTE — Progress Notes (Signed)
Physical Therapy Session Note  Patient Details  Name: Ethan Rose MRN: [id number] Date of Birth: [birth year]  Today's Date: 08/08/2016 PT Individual Time: 0800-0830 PT Individual Time Calculation (min): 30 min    Short Term Goals: Week 3:  PT Short Term Goal 1 (Week 3): =LTG due to estimated LOS  Skilled Therapeutic Interventions/Progress Updates:    Pt presented in chair having just completed breakfast, requesting to use BR. Pt propelled chair into room and performed lateral scoot to raised toilet. Pt leaned to doff pants however requiring no assist. Pt donned pants after toileting in same manner.  Pt requesting to brush teeth, encouraged to attempt in standing. Performed sit to stand from w/c and able to perform setup whilel in standing however unable to maintain to perform activity. Pt completed activity in sitting.  Pt propelled self to rehab gym and practiced sit to stand transfers from 23" elevated mat.  Pt instructed in increased forward flexion at trunk to facilitate transfer and encouraged use of LE vs dominant use of UE.  Pt performed x5.  Pt then propelled self to room and remained in w/c with all current needs met.   Therapy Documentation Precautions:  Precautions Precautions: Fall Restrictions Weight Bearing Restrictions: No General:   Vital Signs: Therapy Vitals Temp: 97.9 F (36.6 C) Temp Source: Oral Pulse Rate: 88 Resp: 17 BP: (!) 153/78 Patient Position (if appropriate): Sitting Oxygen Therapy SpO2: 98 % O2 Device: Not Delivered Pain:    See Function Navigator for Current Functional Status.   Therapy/Group: Individual Therapy  Ethan Rose  Allessandra Bernardi, PTA  08/08/2016, 4:04 PM

## 2016-08-09 ENCOUNTER — Inpatient Hospital Stay (HOSPITAL_COMMUNITY): Payer: Medicare Other | Admitting: Physical Therapy

## 2016-08-09 ENCOUNTER — Inpatient Hospital Stay (HOSPITAL_COMMUNITY): Payer: Medicare Other | Admitting: Occupational Therapy

## 2016-08-09 NOTE — Progress Notes (Signed)
[place] PHYSICAL MEDICINE & REHABILITATION     PROGRESS NOTE  Rose:  Getting stronger. Still tired at end of the therapy day. Had questions about outpt follow up.   ROS: Denies CP, SOB, N/V/D.  Objective: Vital Signs: Blood pressure 130/60, pulse 78, temperature 98.6 F (37 C), temperature source Oral, resp. rate 16, height 5\' 9"  (1.753 m), weight 63.4 kg (139 lb 11.2 oz), SpO2 98 %. No results found. No results for input(s): WBC, HGB, HCT, PLT in the last 72 hours. No results for input(s): NA, K, CL, GLUCOSE, BUN, CREATININE, CALCIUM in the last 72 hours.  Invalid input(s): CO CBG (last 3)  No results for input(s): GLUCAP in the last 72 hours.  Wt Readings from Last 3 Encounters:  08/03/16 63.4 kg (139 lb 11.2 oz)  07/22/16 72.5 kg (159 lb 12.8 oz)  04/13/16 72.2 kg (159 lb 3.2 oz)    Physical Exam:  BP 130/60 (BP Location: Right Arm)   Pulse 78   Temp 98.6 F (37 C) (Oral)   Resp 16   Ht 5\' 9"  (1.753 m)   Wt 63.4 kg (139 lb 11.2 oz)   SpO2 98%   BMI 20.63 kg/m  Constitutional: He appears well-developed and well-nourished.   HENT: Normocephalic. Atraumatic. Eyes: Conj and EOM intact.  Cardiovascular: Normal rate, regular rhythm and normal heart sounds.   Respiratory: Effort normal, breath sounds normal.  GI: Soft. Nontender. Genitourinary: n/a Musculoskeletal: He exhibits no edema or deformity.  -golf ball sized fluid collection left olecranon Neurological: He is alert and oriented.  Motor 4+/5 proximal to distal in the UE's.  B/l LE: HF 3- to 3/5, KE 3+/5, APF 4/5, ADF remains trace.  Skin: Skin is warm and dry.  Psychiatric: He has a normal mood and affect. His behavior is normal. Judgment and thought content normal.    Assessment/Plan: 1. Functional deficits secondary to debility after multiple medical complications which require 3+ hours per day of interdisciplinary therapy in a comprehensive inpatient rehab setting. Physiatrist is  providing close team supervision and 24 hour management of active medical problems listed below. Physiatrist and rehab team continue to assess barriers to discharge/monitor patient progress toward functional and medical goals.  Function:  Bathing Bathing position   Position: Shower  Bathing parts Body parts bathed by patient: Right arm, Left arm, Chest, Abdomen, Front perineal area, Buttocks, Right upper leg, Left upper leg, Right lower leg, Left lower leg, Back    Bathing assist Assist Level: Set up   Set up : To obtain items  Upper Body Dressing/Undressing Upper body dressing   What is the patient wearing?: Pull over shirt/dress     Pull over shirt/dress - Perfomed by patient: Thread/unthread right sleeve, Thread/unthread left sleeve, Put head through opening, Pull shirt over trunk          Upper body assist Assist Level: More than reasonable time   Set up : To obtain clothing/put away  Lower Body Dressing/Undressing Lower body dressing   What is the patient wearing?: Pants, Non-skid slipper socks     Pants- Performed by patient: Thread/unthread right pants leg, Thread/unthread left pants leg, Pull pants up/down Pants- Performed by helper: Thread/unthread right pants leg, Thread/unthread left pants leg Non-skid slipper socks- Performed by patient: Don/doff right sock, Don/doff left sock Non-skid slipper socks- Performed by helper: Don/doff right sock, Don/doff left sock     Shoes - Performed by patient: Don/doff right shoe, Don/doff left shoe, Fasten right, Fasten left  AFO - Performed by patient: Don/doff right AFO, Don/doff left AFO        Lower body assist Assist for lower body dressing: More than reasonable time      Toileting Toileting Toileting activity did not occur: No continent bowel/bladder event (foley, no BM) Toileting steps completed by patient: Adjust clothing prior to toileting, Performs perineal hygiene, Adjust clothing after toileting Toileting  steps completed by helper: Adjust clothing prior to toileting, Adjust clothing after toileting Toileting Assistive Devices: Grab bar or rail  Toileting assist Assist level: More than reasonable time   Transfers Chair/bed transfer   Chair/bed transfer method: Lateral scoot Chair/bed transfer assist level: No Help, no cues, assistive device, takes more than a reasonable amount of time Chair/bed transfer assistive device: Armrests     Locomotion Ambulation     Max distance: 78 Assist level: No help, No cues, assistive device, takes more than a reasonable amount of time   Wheelchair   Type: Manual Max wheelchair distance: 180 Assist Level: No help, No cues, assistive device, takes more than reasonable amount of time  Cognition Comprehension Comprehension assist level: Follows complex conversation/direction with no assist  Expression Expression assist level: Expresses complex ideas: With no assist  Social Interaction Social Interaction assist level: Interacts appropriately with others with medication or extra time (anti-anxiety, antidepressant).  Problem Solving Problem solving assist level: Solves complex problems: Recognizes & self-corrects  Memory Memory assist level: Complete Independence: No helper    Medical Problem List and Plan: 1.  Mobility and functional deficits secondary to debility after multiple medical complications  -finalizing dc planning for tomorrow    2.  DVT Prophylaxis/Anticoagulation: Pharmaceutical: Lovenox 3. Pain Management: tylenol. Pain controlled.  4. Mood: team to provide ego support as necessary. Pt appears motivated. 5. Neuropsych: This patient is capable of making decisions on his own behalf. 6. Skin/Wound Care: encourage nutrition.   -local skin care as necessary 7. Fluids/Electrolytes/Nutrition:  -encourage PO 8. E coli bacteremia/UTI: Continue to monitor for temps/symptoms rather that WBC per ID. Bactrim for 10 days---completed     -stone  retrieval after discharge-follow up with urology (10/5)    -encouraged relative fluid rationing to decrease HS urination    9. Hypokalemia:resolved 10. Prediabetes: dietary education for now 11. Protein calorie malnutrition: improving intake 12. 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 He does have a history of cervical stenosis, myelopathy, and subsequent surgery as wel.  -AFO/shoewear functional 13. Left olecranon bursitis---  -continue pressure relief--continue elbow pad  -prn ice  -overall improved LOS (Days) 18 A FACE TO FACE EVALUATION WAS PERFORMED  SWARTZ,ZACHARY T 08/09/2016 3:28 PM

## 2016-08-09 NOTE — Progress Notes (Signed)
Physical Therapy Discharge Summary  Patient Details  Name: Ethan Rose MRN: [id number] Date of Birth: [birth year]  Today's Date: 08/09/2016 PT Individual Time: 1030-1100 and 1430-1530 PT Individual Time Calculation (min): 30 min and 60 min (total 90 min)     Patient has met 7 of 8 long term goals due to improved activity tolerance, improved balance, improved postural control, increased strength, ability to compensate for deficits and functional use of  right upper extremity, right lower extremity, left upper extremity and left lower extremity.  Patient to discharge at ambulatory level short distances with rollator in home, w/c level outside of home and when fatigued, level Modified Independent.   Patient's care partner is independent to provide the necessary intermittent S assistance at discharge.  Reasons goals not met: Gait in controlled environment unmet due to poor endurance; pt limited to 40' at most due to fatigue.   Recommendation:  Patient will benefit from ongoing skilled PT services in home health setting to continue to advance safe functional mobility, address ongoing impairments in strength, balance, coordination, activity tolerance, and minimize fall risk.  Equipment: 16x16 w/c  Reasons for discharge: treatment goals met and discharge from hospital  Patient/family agrees with progress made and goals achieved: Yes  PT Discharge Precautions/RestrictionsPrecautions Precautions: Fall Restrictions Weight Bearing Restrictions: No Pain Pain Assessment Pain Assessment: No/denies pain Vision/Perception    WFL Cognition Overall Cognitive Status: Within Functional Limits for tasks assessed Arousal/Alertness: Awake/alert Orientation Level: Oriented X4 Attention: Alternating;Sustained;Selective Sustained Attention: Appears intact Selective Attention: Appears intact Alternating Attention: Appears intact Memory: Appears intact Awareness: Appears intact Problem Solving:  Appears intact Safety/Judgment: Appears intact Sensation Sensation Light Touch: Appears Intact Stereognosis: Not tested Hot/Cold: Not tested Proprioception: Appears Intact Coordination Gross Motor Movements are Fluid and Coordinated: No Fine Motor Movements are Fluid and Coordinated: Yes Finger Nose Finger Test: [facility] Heel Shin Test: Impaired BLE due to strength deficits in sitting and supine Motor  Motor Motor: Other (comment) Motor - Discharge Observations: Generalized weakness BLEs  Mobility Bed Mobility Bed Mobility: Supine to Sit;Sit to Supine Supine to Sit: 6: Modified independent (Device/Increase time) Sit to Supine: 6: Modified independent (Device/Increase time) Transfers Transfers: Yes Stand Pivot Transfers: 6: Modified independent (Device/Increase time) (with rollator to elevated seat height) Squat Pivot Transfers: 6: Modified independent (Device/Increase time);With armrests Locomotion  Ambulation Ambulation: Yes Ambulation/Gait Assistance: 6: Modified independent (Device/Increase time) Ambulation Distance (Feet): 78 Feet Assistive device: Rollator Gait Gait: Yes Gait Pattern: Impaired Gait Pattern: Poor foot clearance - right;Poor foot clearance - left;Right foot flat;Left foot flat;Trendelenburg;Decreased stride length;Decreased weight shift to right Gait velocity: decreased for age/gender norms Stairs / Additional Locomotion Stairs: No (refused; not required for home entry) Ramp: 4: Min assist (with rollator; unable to complete 10' ramp due to fatigue; seated rest break on rollator before completing) Curb: Not tested (comment) (safety/medical concerns) Product manager Mobility: Yes Wheelchair Assistance: 6: Modified independent (Device/Increase time) Environmental health practitioner: Both upper extremities Wheelchair Parts Management: Independent Distance: 180  Trunk/Postural Assessment  Cervical Assessment Cervical Assessment: Within Functional  Limits Thoracic Assessment Thoracic Assessment: Within Functional Limits Lumbar Assessment Lumbar Assessment: Within Functional Limits Postural Control Postural Control: Deficits on evaluation (decreased righting reactions, stepping strategies for balance recovery)  Balance Balance Balance Assessed: Yes Static Sitting Balance Static Sitting - Balance Support: Feet supported;No upper extremity supported Static Sitting - Level of Assistance: 6: Modified independent (Device/Increase time) Dynamic Sitting Balance Dynamic Sitting - Balance Support: Feet supported;No upper extremity supported Dynamic Sitting - Level of  Assistance: 6: Modified independent (Device/Increase time) Dynamic Sitting - Balance Activities: Lateral lean/weight shifting;Forward lean/weight shifting;Reaching across midline Static Standing Balance Static Standing - Balance Support: Bilateral upper extremity supported;During functional activity Static Standing - Level of Assistance: 6: Modified independent (Device/Increase time) Dynamic Standing Balance Dynamic Standing - Balance Support: Bilateral upper extremity supported;During functional activity Dynamic Standing - Level of Assistance: 6: Modified independent (Device/Increase time) Dynamic Standing - Balance Activities: Lateral lean/weight shifting;Forward lean/weight shifting;Reaching across midline;Reaching for objects Extremity Assessment   BUE WFL   RLE Assessment RLE Assessment: Exceptions to [facility] RLE Strength Right Hip Flexion: 2/5 Right Knee Flexion: 3+/5 Right Knee Extension: 4-/5 Right Ankle Dorsiflexion: 1/5 LLE Assessment LLE Assessment: Exceptions to [facility] LLE Strength Left Hip Flexion: 2/5 Left Knee Flexion: 3/5 Left Knee Extension: 4/5 Left Ankle Dorsiflexion: 1/5  Skilled Therapeutic Intervention:Tx 1: pt received seated in bed; denies pain and agreeable to treatment. Pt dons B shoes/AFOs with modI and increased time using stool to elevate feet.  Squat pivot transfer modI bed>w/c. W/c propulsion x180' modI. Gait with rollator x78' modI, with seated rest break needed on rollator due to fatigue. Set up pt's w/c for home use including education regarding w/c management during storage/travel, and donned velcro to bottom of cushion to improve ease of removal. Pt remained seated in w/c at end of session, all needs in reach.   Tx 2: Pt received supine in bed, denies pain and agreeable to treatment. Assessed gait on ramp with rollator and minA; required seated rest break before completing ramp due to fatigue. Stairs and uneven surface not performed at this time due to safety concerns. Car transfer with S overall and use of rollator; min cues for hand placement inside car. Nustep x13 min level 4 with BLE for strengthening, coordination, aerobic endurance. Transfer w/c <>nustep with modI. Returned to room with BUE propulsion x180'. Pt with no further questions/concerns regarding d/c at this time. Remained seated in w/c at end of session, all needs in reach.    See Function Navigator for Current Functional Status.  Benjiman Core Tygielski 08/09/2016, 3:10 PM

## 2016-08-09 NOTE — Progress Notes (Signed)
Occupational Therapy Session Note  Patient Details  Name: ZEBEDEE WHITTENBERG MRN: [id number] Date of Birth: May 11, 1946  Today's Date: 08/09/2016 OT Individual Time: [id number] and 1115-1200 OT Individual Time Calculation (min): 60 min   Short Term Goals:Week 3:  OT Short Term Goal 1 (Week 3): STG=LTG due to LOS  Skilled Therapeutic Interventions/Progress Updates:    Session One: Pt seen for OT ADL bathing/dressing session. Pt sitting up in w/c upon arrival, agreeable to tx session. Pt showered seated on 3-1 BSC, stand pivot from highly elevated BSC completed out of shower with supervision. He required heavy steadying assist to transfer w/c> rollator during transition of moving UEs. He dressed from rollator mod I completing sit <> stands for clothing management. He completed grooming tasks at sink. He was able to stand with one UE support while he put on shaving cream and then sat on rollator to complete task.  Squat pivot mod I completed back to bed at end of session, pt left in supine with all needs in reach.  Cont to educate and problem solve regarding toileting at home. Standing transfers vs. Squat pivot depending on height of pt's home BSC as well as fatigue level. Educated regarding energy conservation, prioritizing tasks, and modififying tasks based on fatigue.   Session Two: Pt seen for OT tx session focusing on functional transfers and IADL re-training from w/c level. Pt sitting up in w/c upon arrival, agreeable to tx session and voicing need for toileting task. Mod I squat pivot transfers completed to toilet. Pt completed clothing management via lateral leans with increased time to pull pants up.  Practiced simulated laundry task from w/c level with education provided regarding set-up of laundry room and w/c for increased success and ease as well as recommendation for use of reacher when completing ADL/IADL tasks from w/c level.  In ADL kitchen, pt practiced retrieving items from overhead  cabinets and refrigerator at w/c level using reacher. Discussed kitchen layout and recommendations for keeping commonly used items on easy to reach shelves and transporting items in w/c. Pt returned to room at end of session, left with all needs in reach awaiting lunch. Pt voiced feeling comfortable with planned d/c home tomorrow though anxious to incorporate when he has learned on CIR into home environment.   Therapy Documentation Precautions:  Precautions Precautions: Fall Restrictions Weight Bearing Restrictions: No Pain:   No/ denies pain ADL: ADL ADL Comments: see Functional Assessment Tool  See Function Navigator for Current Functional Status.   Therapy/Group: Individual Therapy  [name], Harland Aguiniga C 08/09/2016, 7:09 AM

## 2016-08-09 NOTE — Progress Notes (Signed)
Occupational Therapy Discharge Summary  Patient Details  Name: Ethan Rose MRN: [id number] Date of Birth: 11-07-1946   Patient has met 10 of 10 long term goals due to improved activity tolerance, improved balance and postural control.  Patient to discharge at overall Modified Independent level from w/c level using squat pivot transfers with supervision for shower transfers. Pt can require mod-max assist for sit <> stand when fatigued. .  Patient's care partner is independent to provide the necessary physical assistance at discharge.    Pt d/c at overall mod I level from w/c level completing squat pivot transfers. Pt most limited by decreased standing endurance and ability to complete sit <> stand from lower surfaces. Recommend squat pivot transfers for toileting transfers at this time. Pt with excellent safety awareness and awareness of deficits in regards to functional tasks.  Extensive education provided regarding recommendation for pt to complete all transfers with someone present initially in order to ensure he can complete sit <> stand from that height.    Recommendation:  Patient will benefit from ongoing skilled OT services in home health setting to continue to advance functional skills in the area of BADL and iADL.  Equipment: Drop arm BSC  Reasons for discharge: treatment goals met and discharge from hospital  Patient/family agrees with progress made and goals achieved: Yes  OT Discharge Precautions/Restrictions  Precautions Precautions: Fall Restrictions Weight Bearing Restrictions: No ADL ADL ADL Comments: see Functional Assessment Tool Vision/Perception  Vision- History Baseline Vision/History: Wears glasses Wears Glasses: Reading only Patient Visual Report: No change from baseline Vision- Assessment Vision Assessment?: No apparent visual deficits  Cognition Overall Cognitive Status: Within Functional Limits for tasks assessed Arousal/Alertness:  Awake/alert Orientation Level: Oriented X4 Attention: Alternating;Sustained;Selective Sustained Attention: Appears intact Selective Attention: Appears intact Alternating Attention: Appears intact Memory: Appears intact Awareness: Appears intact Problem Solving: Appears intact Safety/Judgment: Appears intact Sensation Sensation Light Touch: Appears Intact Stereognosis: Not tested Hot/Cold: Not tested Proprioception: Appears Intact Coordination Gross Motor Movements are Fluid and Coordinated: No Fine Motor Movements are Fluid and Coordinated: Yes Finger Nose Finger Test: [facility] Heel Shin Test: Impaired BLE due to strength deficits in sitting and supine Motor  Motor Motor: Within Functional Limits Motor - Discharge Observations: Generalized weakness BLEs Mobility  Bed Mobility Bed Mobility: Supine to Sit;Sit to Supine Supine to Sit: 6: Modified independent (Device/Increase time) Sit to Supine: 6: Modified independent (Device/Increase time)  Trunk/Postural Assessment  Cervical Assessment Cervical Assessment: Within Functional Limits Thoracic Assessment Thoracic Assessment: Within Functional Limits Lumbar Assessment Lumbar Assessment: Within Functional Limits Postural Control Postural Control: Deficits on evaluation  Balance Balance Balance Assessed: Yes Static Sitting Balance Static Sitting - Balance Support: Feet supported;No upper extremity supported Static Sitting - Level of Assistance: 6: Modified independent (Device/Increase time) Dynamic Sitting Balance Dynamic Sitting - Balance Support: Feet supported;No upper extremity supported Dynamic Sitting - Level of Assistance: 6: Modified independent (Device/Increase time) Dynamic Sitting - Balance Activities: Lateral lean/weight shifting;Forward lean/weight shifting;Reaching across midline Static Standing Balance Static Standing - Balance Support: Bilateral upper extremity supported;During functional activity Static  Standing - Level of Assistance: 6: Modified independent (Device/Increase time) Static Standing - Comment/# of Minutes: Very limited standing endurance <30 seconds Dynamic Standing Balance Dynamic Standing - Balance Support: Bilateral upper extremity supported;During functional activity Dynamic Standing - Level of Assistance: 5: Stand by assistance;6: Modified independent (Device/Increase time) Dynamic Standing - Balance Activities: Lateral lean/weight shifting;Forward lean/weight shifting;Reaching across midline;Reaching for objects Dynamic Standing - Comments: Standing to complete LB dressing;  very limited standing endurance Extremity/Trunk Assessment RUE Assessment RUE Assessment: Within Functional Limits RUE Strength RUE Overall Strength: Within Functional Limits for tasks performed LUE Assessment LUE Assessment: Within Functional Limits LUE Strength LUE Overall Strength: Within Functional Limits for tasks assessed   See Function Navigator for Current Functional Status.  Bobby Rumpf, Misha Vanoverbeke C 08/09/2016, 4:05 PM

## 2016-08-10 DIAGNOSIS — N132 Hydronephrosis with renal and ureteral calculous obstruction: Secondary | ICD-10-CM

## 2016-08-10 MED ORDER — VITAMIN D (ERGOCALCIFEROL) 1.25 MG (50000 UNIT) PO CAPS: 50000.0000 [IU] | ORAL_CAPSULE | ORAL | 0 refills | Status: DC

## 2016-08-10 NOTE — Discharge Summary (Signed)
Physician Discharge Summary  Patient ID: QURAN DIMICELI MRN: [id number] DOB/AGE: Feb 12, 1946 70 y.o.  Admit date: 07/22/2016 Discharge date: 08/10/2016  Discharge Diagnoses:  Principal Problem:   Debility Active Problems:   Weakness of both legs   Spondylolisthesis of lumbar region   E coli bacteremia   Ureteral stone with hydronephrosis   Discharged Condition:  Stable   Significant Diagnostic Studies: Ct Renal Stone Study  Result Date: 07/16/2016 CLINICAL DATA:  Left flank pain, fever, history of renal stones status post lithotripsy EXAM: CT ABDOMEN AND PELVIS WITHOUT CONTRAST TECHNIQUE: Multidetector CT imaging of the abdomen and pelvis was performed following the standard protocol without IV contrast. COMPARISON:  02/27/2014 FINDINGS: Lower chest: Trace left pleural effusion. Mild dependent atelectasis in the bilateral lung bases. Hepatobiliary: Mild hepatic steatosis. Gallbladder is unremarkable. No intrahepatic or extrahepatic ductal dilatation. Pancreas: Pancreatic atrophy. Spleen: Within normal limits. Adrenals/Urinary Tract: Adrenal glands are unremarkable. Multiple small bilateral renal calculi measuring up to 3 mm bilaterally. Moderate left hydroureteronephrosis. Associated 8 mm mid left ureteral calculus (series 2/ image 49). Bladder is within normal limits. Stomach/Bowel: Stomach is within normal limits. No evidence of bowel obstruction. Vascular/Lymphatic: Atherosclerotic calcifications of the abdominal aorta and branch vessels. No evidence of abdominal aortic aneurysm. No suspicious abdominopelvic lymphadenopathy. Reproductive: Prostate is notable for dystrophic calcifications. Other: No abdominopelvic ascites. Tiny fat containing right inguinal hernia (series 2/image 80). Musculoskeletal: Mild posterior wedging at L1 with associated posterior thoracolumbar fixation extending from T10 through S1. IMPRESSION: 8 mm mid left ureteral calculus. Associated moderate left  hydroureteronephrosis. Multiple small nonobstructing bilateral renal calculi measuring up to 3 mm. Mild posterior wedging at L1. Associated posterior thoracolumbar fixation extending from T10 through S1. Additional ancillary findings as above. Electronically Signed   By: Julian Hy M.D.   On: 07/16/2016 13:28    Labs:  Basic Metabolic Panel:  Recent Labs Lab 08/04/16 0709  NA 138  K 4.0  CL 103  CO2 25  GLUCOSE 116*  BUN 17  CREATININE 0.50*  CALCIUM 9.0    CBC:  Recent Labs Lab 08/04/16 0709  WBC 8.4  NEUTROABS 5.3  HGB 11.9*  HCT 36.6*  MCV 88.4  PLT 412*    CBG: No results for input(s): GLUCAP in the last 168 hours.  Brief HPI:   Helmut Chiappa is a 70 year old male with history of  prediabetes, multiple back surgeries with chronic back pain and bilateral foot drop,  cervical myelopathy, nephrolithiasis who was admitted on [facility] on 07/16/16 with pain, fever and generalized malaise due to  sepsis from left ureteral stone with hydronephrosis. He underwent left retrograde pyelography with JJ  Stent placement by Dr. Alyson Ingles on 9/2.  UCS/BC positive for E coli and antibiotics narrowed to ceftriaxone. He has effervesced but noted to have rise in WBC to 19.3. Repeat [facility] 9/4 negative and case ID felt that current treatment appropriate --to monitor patient.  Therapy ongoing and patient noted to be severely debilitated with inability to walk. CIR recommended for follow up therapy.    Hospital Course: CARNEY MERCADEL was admitted to rehab 07/22/2016 for inpatient therapies to consist of PT and OT at least three hours five days a week. Past admission physiatrist, therapy team and rehab RN have worked together to provide customized collaborative inpatient rehab. He completed 10 day course of bactrim without side effects and follow up UCS showed insignificant growth.  Foley was removed on 9/12 and he is voiding without difficulty. PVRs X 3 have  ranged from 0-40 cc. GU was updated on LOS  and patient has been set up for cystoscopy with stone retrieval on 10/2.  He  was started on 50,000 ergocalciferol 9/14. He has had issue with chronic bursitris and left elbow was aspirated of 6 cc serosanguinous fluid on 9/15. Po intake has been good and he is continent of bowel and bladder. He has been afebrile during his stay and blood pressures have been well controlled.  Follow up CBC showed that leucocytosis has resolved and renal status is stable.  He has made steady progress but continues to be limited by decreased endurance and decreased standing tolerance. He is currently modified independent for household distances and needs supervision for shower transfers. He will continue to receive follow up HHPT and [place] by [place] after discharge.    Rehab course: During patient's stay in rehab weekly team conferences were held to monitor patient's progress, set goals and discuss barriers to discharge. At admission, patient required moderate assist with basic self care tasks and max assist with mobility. He has had improvement in activity tolerance, balance, postural control, as well as ability to compensate for deficits. He is able to complete  ADL tasks independently. He continues to have limited endurance and was advised to perform squat pivot transfers for toileting. He is modified independent for transfers and ambulation. He is able to ambulate 85' with rollater and B-AFO to help with foot clearance. Family education was done regrading all aspects of safety and mobility.      Disposition: 01-Home or Self Care  Diet:  Regular.   Special Instructions: 1. No driving or strenuous activity till cleared by MD.    Discharge Instructions    Ambulatory referral to Physical Medicine Rehab    Complete by:  As directed    Appointment in 2-4 weeks       Medication List    STOP taking these medications   HYDROcodone-acetaminophen 5-325 MG tablet Commonly known as:  NORCO/VICODIN    OVER THE COUNTER MEDICATION   OVER THE COUNTER MEDICATION   OVER THE COUNTER MEDICATION   sulfamethoxazole-trimethoprim 800-160 MG tablet Commonly known as:  BACTRIM DS,SEPTRA DS     TAKE these medications   Cholecalciferol 5000 units capsule Take 5,000 Units by mouth daily.   multivitamin with minerals tablet Take 1 tablet by mouth daily.   OVER THE COUNTER MEDICATION Take 1 Dose by mouth daily. Over the counter OPC3 and Isochrome powders 1 capful of both mixed in 4 ounces of water daily   OVER THE COUNTER MEDICATION Take 1 capsule by mouth 2 (two) times daily. Nerve renew   PAPAYA PO Take 3 tablets by mouth daily as needed (heartburn).   PROBIOTIC PO Take 50 mg by mouth daily.   Vitamin D (Ergocalciferol) 50000 units Caps capsule Commonly known as:  DRISDOL Take 1 capsule (50,000 Units total) by mouth every 7 (seven) days. Start taking on:  08/11/2016   Zinc Methionate 50 MG Caps Take 50 mg by mouth daily.      Follow-up Information    Nicolette Bang, MD Follow up on 08/15/2016.   Specialty:  Urology Why:  08/15/16--surgery at noon. Office will contact you with details Contact information: [place] [address] [phone]        Meredith Staggers, MD .   Specialty:  Physical Medicine and Rehabilitation Why:  office will call you for follow up appointment Contact information: [place]  [place] [address] [phone]        Tammi Sou, MD Follow up on 08/23/2016.   Specialty:  Family Medicine Why:  @ 11:00 am (hospital follow up appointment) Contact information: [address] [place]  [address] [phone]           Signed: Bary Leriche 08/10/2016, 10:06 PM

## 2016-08-10 NOTE — Progress Notes (Signed)
Social Work  Discharge Note  The overall goal for the admission was met for:   Discharge location: Yes - home with daughter and son-in-law  Length of Stay: Yes - 19 days  Discharge activity level: Yes - supervision/ mod ind  Home/community participation: Yes  Services provided included: MD, RD, PT, OT, RN, TR, Pharmacy and SW  Financial Services: Medicare and Private Insurance: [place]  Follow-up services arranged: Home Health: PT, OT via [place], DME: 16x16 lightweight w/c with tension adjustable back, cushion, drop arm commode via [place] and Patient/Family request agency HH: [facility], DME: [facility]  Comments (or additional information):  Patient/Family verbalized understanding of follow-up arrangements: Yes  Individual responsible for coordination of the follow-up plan: pt  Confirmed correct DME delivered: Ario Mcdiarmid 08/10/2016    [place], [place]

## 2016-08-10 NOTE — Patient Care Conference (Signed)
Inpatient RehabilitationTeam Conference and Plan of Care Update Date: 08/09/2016   Time: 2:05 PM    Patient Name: Ethan Rose      Medical Record Number: [id number]  Date of Birth: 1945-12-02 Sex: Male         Room/Bed: 4M06C/4M06C-01 Payor Info: Payor: MEDICARE / Plan: MEDICARE PART A AND B / Product Type: *No Product type* /    Admitting Diagnosis: sepsis  Admit Date/Time:  07/22/2016  1:26 PM Admission Comments: No comment available   Primary Diagnosis:  Debility Principal Problem: Debility  Patient Active Problem List   Diagnosis Date Noted  . Vitamin D deficiency 08/10/2016  . Debility 07/22/2016  . Pressure ulcer 07/18/2016  . E coli bacteremia   . Leukocytosis   . Sepsis ([place]) 07/16/2016  . Severe sepsis with septic shock ([place]) 07/16/2016  . Pyelonephritis 07/16/2016  . Left ureteral stone 07/16/2016  . Septic shock ([place]) 07/16/2016  . Cervical myelopathy ([place]) 03/18/2016  . 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 Other malaise and fatigue 05/14/2014  . Health maintenance examination 04/29/2013  . Weakness of both legs 04/29/2013  . Leg cramps 04/29/2013  . Elevated blood pressure reading without diagnosis of hypertension 03/30/2012  . Dizziness 03/30/2012    Expected Discharge Date: Expected Discharge Date: 08/10/16  Team Members Present: Physician leading conference: Dr. Alger Simons Social Worker Present: Lennart Pall, LCSW Nurse Present: Dorien Chihuahua, RN PT Present: Canary Brim, Harriet Pho, PT OT Present: Napoleon Form, OT SLP Present: Weston Anna, SLP PPS Coordinator present : Daiva Nakayama, RN, CRRN     Current Status/Progress Goal Weekly Team Focus  Medical   proximal legs still weak. ready for discharge. urology follow up  see prior  focus on strengthening, balance,    Bowel/Bladder   Continent of bowel/bladder. LBM 08/05/16   Patient to remain continent of bowel and bladder  Monitor   Swallow/Nutrition/ Hydration             ADL's   Supervision- mod I overall  Supervision- mod I  Tx goals met, pt d/c home today   Mobility   modI bed mobility, transfers, gait in home environment with rollator  modI bed mobility and transfers, modI gait 52' with rollator in home  LE strengthening, endurance, gait training, pt education and HEP   Communication             Safety/Cognition/ Behavioral Observations            Pain   No c/o pain  <3  Monitor for nonverbal cues   Skin   CDI  CDI  Assess skin q shift      *See Care Plan and progress notes for long and short-term goals.  Barriers to Discharge: none    Possible Resolutions to Barriers:  n/a    Discharge Planning/Teaching Needs:  Plan to d/c home with intermittent assist of family.  May be able to provide 24/7 assist initially.      Team Discussion:  Ready for d/c.  Able to direct all care and likely at [facility]iline.  Revisions to Treatment Plan:  None   Continued Need for Acute Rehabilitation Level of Care: The patient requires daily medical management by a physician with specialized training in physical medicine and rehabilitation for the following conditions: Daily direction of a multidisciplinary physical rehabilitation program to ensure safe treatment while eliciting the highest outcome that is of  practical value to the patient.: Yes Daily medical management of patient stability for increased activity during participation in an intensive rehabilitation regime.: Yes Daily analysis of laboratory values and/or radiology reports with any subsequent need for medication adjustment of medical intervention for : Post surgical problems;Neurological problems  Lenorris Karger 08/10/2016, 3:53 PM

## 2016-08-10 NOTE — Progress Notes (Signed)
Recreational Therapy Discharge Summary Patient Details  Name: Ethan Rose MRN: [id number] Date of Birth: June 18, 1946 Today's Date: 08/10/2016  Long term goals set: 1  Long term goals met: 1  Comments on progress toward goals: Pt has made great progress during LOS and is ready for discharge home with family to provide intermittent supervision.  Pt met set up supervision level for TR tasks.  Pt remains motivated to regain further independence once home.  Reasons for discharge: discharge from hospital  Patient/family agrees with progress made and goals achieved: Yes  [name],[name] 08/10/2016, 4:19 PM

## 2016-08-10 NOTE — Progress Notes (Signed)
[facility] PHYSICAL MEDICINE & REHABILITATION     PROGRESS NOTE  Subjective/Complaints:  Had a good day yesterday. Walked the farthest he has so far. Feels ready to go home.   ROS: Denies CP, SOB, N/V/D.  Objective: Vital Signs: Blood pressure 132/69, pulse 64, temperature 98.3 F (36.8 C), temperature source Oral, resp. rate 16, height '5\' 9"'  (1.753 m), weight 63.4 kg (139 lb 11.2 oz), SpO2 100 %. No results found. No results for input(s): WBC, HGB, HCT, PLT in the last 72 hours. No results for input(s): NA, K, CL, GLUCOSE, BUN, CREATININE, CALCIUM in the last 72 hours.  Invalid input(s): CO CBG (last 3)  No results for input(s): GLUCAP in the last 72 hours.  Wt Readings from Last 3 Encounters:  08/03/16 63.4 kg (139 lb 11.2 oz)  07/22/16 72.5 kg (159 lb 12.8 oz)  04/13/16 72.2 kg (159 lb 3.2 oz)    Physical Exam:  BP 132/69 (BP Location: Right Arm)   Pulse 64   Temp 98.3 F (36.8 C) (Oral)   Resp 16   Ht '5\' 9"'  (1.753 m)   Wt 63.4 kg (139 lb 11.2 oz)   SpO2 100%   BMI 20.63 kg/m  Constitutional: He appears well-developed and well-nourished.   HENT: Normocephalic. Atraumatic. Eyes: Conj and EOM intact.  Cardiovascular: Normal rate, regular rhythm and normal heart sounds.   Respiratory: Effort normal, breath sounds normal.  GI: Soft. Nontender. Genitourinary: n/a Musculoskeletal: He exhibits no edema or deformity.  -golf ball sized fluid collection left olecranon Neurological: He is alert and oriented.  Motor 4+/5 proximal to distal in the UE's.  B/l LE: HF 3- to 3/5, KE 3+/5, APF 4/5, ADF remains trace.  Skin: Skin is warm and dry.  Psychiatric: He has a normal mood and affect. His behavior is normal. Judgment and thought content normal.    Assessment/Plan: 1. Functional deficits secondary to debility after multiple medical complications which require 3+ hours per day of interdisciplinary therapy in a comprehensive inpatient rehab setting. Physiatrist is  providing close team supervision and 24 hour management of active medical problems listed below. Physiatrist and rehab team continue to assess barriers to discharge/monitor patient progress toward functional and medical goals.  Function:  Bathing Bathing position   Position: Shower  Bathing parts Body parts bathed by patient: Right arm, Left arm, Chest, Abdomen, Front perineal area, Buttocks, Right upper leg, Left upper leg, Right lower leg, Left lower leg, Back    Bathing assist Assist Level: Set up   Set up : To obtain items  Upper Body Dressing/Undressing Upper body dressing   What is the patient wearing?: Pull over shirt/dress     Pull over shirt/dress - Perfomed by patient: Thread/unthread right sleeve, Thread/unthread left sleeve, Put head through opening, Pull shirt over trunk          Upper body assist Assist Level: More than reasonable time   Set up : To obtain clothing/put away  Lower Body Dressing/Undressing Lower body dressing   What is the patient wearing?: Pants, Non-skid slipper socks     Pants- Performed by patient: Thread/unthread right pants leg, Thread/unthread left pants leg, Pull pants up/down Pants- Performed by helper: Thread/unthread right pants leg, Thread/unthread left pants leg Non-skid slipper socks- Performed by patient: Don/doff right sock, Don/doff left sock Non-skid slipper socks- Performed by helper: Don/doff right sock, Don/doff left sock     Shoes - Performed by patient: Don/doff right shoe, Don/doff left shoe, Fasten right, Fasten left  AFO - Performed by patient: Don/doff right AFO, Don/doff left AFO        Lower body assist Assist for lower body dressing: More than reasonable time      Toileting Toileting Toileting activity did not occur: No continent bowel/bladder event (foley, no BM) Toileting steps completed by patient: Adjust clothing prior to toileting, Performs perineal hygiene, Adjust clothing after toileting Toileting  steps completed by helper: Adjust clothing prior to toileting, Adjust clothing after toileting Toileting Assistive Devices: Grab bar or rail  Toileting assist Assist level: More than reasonable time   Transfers Chair/bed transfer   Chair/bed transfer method: Lateral scoot Chair/bed transfer assist level: No Help, no cues, assistive device, takes more than a reasonable amount of time Chair/bed transfer assistive device: Armrests     Locomotion Ambulation     Max distance: 78 Assist level: No help, No cues, assistive device, takes more than a reasonable amount of time   Wheelchair   Type: Manual Max wheelchair distance: 180 Assist Level: No help, No cues, assistive device, takes more than reasonable amount of time  Cognition Comprehension Comprehension assist level: Follows complex conversation/direction with no assist  Expression Expression assist level: Expresses complex ideas: With no assist  Social Interaction Social Interaction assist level: Interacts appropriately with others - No medications needed.  Problem Solving Problem solving assist level: Solves complex problems: Recognizes & self-corrects  Memory Memory assist level: Complete Independence: No helper    Medical Problem List and Plan: 1.  Mobility and functional deficits secondary to debility after multiple medical complications  -dc home today--goals met--HH PT  -follow up with me in 2-4 weeks    2.  DVT Prophylaxis/Anticoagulation: Pharmaceutical: Lovenox 3. Pain Management: tylenol. Pain controlled.  4. Mood: team to provide ego support as necessary. Pt appears motivated. 5. Neuropsych: This patient is capable of making decisions on his own behalf. 6. Skin/Wound Care: encourage nutrition.   -local skin care as necessary 7. Fluids/Electrolytes/Nutrition:  -encourage PO 8. E coli bacteremia/UTI: Continue to monitor for temps/symptoms rather that WBC per ID. Bactrim for 10 days---completed     -stone retrieval  after discharge-follow up with urology (10/5)    -encouraged relative fluid rationing to decrease HS urination    9. Hypokalemia:resolved 10. Prediabetes: dietary education for now 11. Protein calorie malnutrition: improving intake 12. 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 He does have a history of cervical stenosis, myelopathy, and subsequent surgery as wel.  -AFO/shoewear functional 13. Left olecranon bursitis---  -continue pressure relief--continue elbow pad  -prn ice  -overall improved LOS (Days) 19 A FACE TO FACE EVALUATION WAS PERFORMED  Fancy Dunkley T 08/10/2016 8:33 AM

## 2016-08-10 NOTE — Discharge Instructions (Signed)
°  Inpatient Rehab Discharge Instructions  Ethan Rose Discharge date and time:  08/10/16  Activities/Precautions/ Functional Status: Activity: no lifting, driving, or strenuous exercise for till cleared by MD Diet: regular diet Wound Care: none needed   Functional status:  ___ No restrictions     ___ Walk up steps independently ___ 24/7 supervision/assistance   ___ Walk up steps with assistance ___ Intermittent supervision/assistance  ___ Bathe/dress independently ___ Walk with walker     ___ Bathe/dress with assistance ___ Walk Independently    ___ Shower independently ___ Walk with assistance    ___ Shower with assistance ___ No alcohol     ___ Return to work/school ________    COMMUNITY REFERRALS UPON DISCHARGE:    Home Health:   PT     OT                         Agency:  [facility] Phone: [phone]   Medical Equipment/Items Ordered:  Wheelchair, cushion, droparm commode                                                      Agency/Supplier:  [facility]     Special Instructions:    My questions have been answered and I understand these instructions. I will adhere to these goals and the provided educational materials after my discharge from the hospital.  Patient/Caregiver Signature _______________________________ Date __________  Clinician Signature _______________________________________ Date __________  Please bring this form and your medication list with you to all your follow-up doctor's appointments.

## 2016-08-10 NOTE — Progress Notes (Signed)
Pt discharged home with family. Discharge instructions provided by Algis Liming. All questions answered, pt verbalized understanding. Pt escorted off unit in w/c with personal belonging by Caryl Pina,  [place]

## 2016-08-11 DIAGNOSIS — G609 Hereditary and idiopathic neuropathy, unspecified: Secondary | ICD-10-CM | POA: Diagnosis not present

## 2016-08-11 DIAGNOSIS — Z4789 Encounter for other orthopedic aftercare: Secondary | ICD-10-CM | POA: Diagnosis not present

## 2016-08-11 DIAGNOSIS — R278 Other lack of coordination: Secondary | ICD-10-CM | POA: Diagnosis not present

## 2016-08-11 DIAGNOSIS — M6281 Muscle weakness (generalized): Secondary | ICD-10-CM | POA: Diagnosis not present

## 2016-08-12 ENCOUNTER — Encounter (HOSPITAL_COMMUNITY): Payer: Self-pay | Admitting: *Deleted

## 2016-08-12 DIAGNOSIS — R278 Other lack of coordination: Secondary | ICD-10-CM | POA: Diagnosis not present

## 2016-08-12 DIAGNOSIS — G609 Hereditary and idiopathic neuropathy, unspecified: Secondary | ICD-10-CM | POA: Diagnosis not present

## 2016-08-12 DIAGNOSIS — M6281 Muscle weakness (generalized): Secondary | ICD-10-CM | POA: Diagnosis not present

## 2016-08-12 DIAGNOSIS — Z4789 Encounter for other orthopedic aftercare: Secondary | ICD-10-CM | POA: Diagnosis not present

## 2016-08-14 DIAGNOSIS — Z48816 Encounter for surgical aftercare following surgery on the genitourinary system: Secondary | ICD-10-CM | POA: Diagnosis not present

## 2016-08-14 DIAGNOSIS — M6281 Muscle weakness (generalized): Secondary | ICD-10-CM | POA: Diagnosis not present

## 2016-08-14 DIAGNOSIS — Z4789 Encounter for other orthopedic aftercare: Secondary | ICD-10-CM | POA: Diagnosis not present

## 2016-08-14 DIAGNOSIS — R278 Other lack of coordination: Secondary | ICD-10-CM | POA: Diagnosis not present

## 2016-08-14 DIAGNOSIS — G609 Hereditary and idiopathic neuropathy, unspecified: Secondary | ICD-10-CM | POA: Diagnosis not present

## 2016-08-15 ENCOUNTER — Encounter (HOSPITAL_COMMUNITY)
Admission: RE | Disposition: A | Discharge status: Home / Self Care | Payer: Self-pay | Source: Ambulatory Visit | Attending: Urology

## 2016-08-15 ENCOUNTER — Ambulatory Visit (HOSPITAL_COMMUNITY): Payer: Medicare Other | Admitting: Anesthesiology

## 2016-08-15 ENCOUNTER — Encounter (HOSPITAL_COMMUNITY): Payer: Self-pay | Admitting: *Deleted

## 2016-08-15 ENCOUNTER — Ambulatory Visit (HOSPITAL_COMMUNITY)
Admission: RE | Admit: 2016-08-15 | Discharge: 2016-08-15 | Disposition: A | Discharge status: Home / Self Care | Payer: Medicare Other | Source: Ambulatory Visit | Attending: Urology | Admitting: Urology

## 2016-08-15 DIAGNOSIS — K219 Gastro-esophageal reflux disease without esophagitis: Secondary | ICD-10-CM | POA: Insufficient documentation

## 2016-08-15 DIAGNOSIS — Z87891 Personal history of nicotine dependence: Secondary | ICD-10-CM | POA: Diagnosis not present

## 2016-08-15 DIAGNOSIS — I10 Essential (primary) hypertension: Secondary | ICD-10-CM | POA: Diagnosis not present

## 2016-08-15 DIAGNOSIS — Z79899 Other long term (current) drug therapy: Secondary | ICD-10-CM | POA: Insufficient documentation

## 2016-08-15 DIAGNOSIS — E559 Vitamin D deficiency, unspecified: Secondary | ICD-10-CM | POA: Diagnosis not present

## 2016-08-15 DIAGNOSIS — N201 Calculus of ureter: Secondary | ICD-10-CM | POA: Diagnosis not present

## 2016-08-15 DIAGNOSIS — Z87442 Personal history of urinary calculi: Secondary | ICD-10-CM | POA: Diagnosis not present

## 2016-08-15 DIAGNOSIS — N1330 Unspecified hydronephrosis: Secondary | ICD-10-CM | POA: Diagnosis not present

## 2016-08-15 HISTORY — PX: HOLMIUM LASER APPLICATION: SHX5852

## 2016-08-15 HISTORY — PX: CYSTOSCOPY WITH RETROGRADE PYELOGRAM, URETEROSCOPY AND STENT PLACEMENT: SHX5789

## 2016-08-15 SURGERY — CYSTOURETEROSCOPY, WITH RETROGRADE PYELOGRAM AND STENT INSERTION
Anesthesia: General | Laterality: Left

## 2016-08-15 MED ORDER — PROPOFOL 10 MG/ML IV BOLUS
INTRAVENOUS | Status: AC
  Filled 2016-08-15: qty 20

## 2016-08-15 MED ORDER — MEPERIDINE HCL 50 MG/ML IJ SOLN: 6.2500 mg | INTRAMUSCULAR | Status: DC | PRN

## 2016-08-15 MED ORDER — IOPAMIDOL (ISOVUE-300) INJECTION 61%
INTRAVENOUS | Status: DC | PRN
  Administered 2016-08-15: 5 mL

## 2016-08-15 MED ORDER — OXYCODONE-ACETAMINOPHEN 5-325 MG PO TABS: 1.0000 | ORAL_TABLET | ORAL | 0 refills | Status: DC | PRN

## 2016-08-15 MED ORDER — PHENYLEPHRINE 40 MCG/ML (10ML) SYRINGE FOR IV PUSH (FOR BLOOD PRESSURE SUPPORT)
PREFILLED_SYRINGE | INTRAVENOUS | Status: DC | PRN
  Administered 2016-08-15 (×4): 80 ug via INTRAVENOUS

## 2016-08-15 MED ORDER — SODIUM CHLORIDE 0.9 % IR SOLN
Status: DC | PRN
  Administered 2016-08-15: 3000 mL

## 2016-08-15 MED ORDER — FENTANYL CITRATE (PF) 100 MCG/2ML IJ SOLN
INTRAMUSCULAR | Status: AC
  Filled 2016-08-15: qty 2

## 2016-08-15 MED ORDER — LIDOCAINE 2% (20 MG/ML) 5 ML SYRINGE
INTRAMUSCULAR | Status: AC
  Filled 2016-08-15: qty 5

## 2016-08-15 MED ORDER — HYDROMORPHONE HCL 1 MG/ML IJ SOLN: 0.2500 mg | INTRAMUSCULAR | Status: DC | PRN

## 2016-08-15 MED ORDER — PROPOFOL 10 MG/ML IV BOLUS
INTRAVENOUS | Status: DC | PRN
  Administered 2016-08-15: 150 mg via INTRAVENOUS
  Administered 2016-08-15: 50 mg via INTRAVENOUS

## 2016-08-15 MED ORDER — LACTATED RINGERS IV SOLN
INTRAVENOUS | Status: DC | PRN
  Administered 2016-08-15 (×2): via INTRAVENOUS

## 2016-08-15 MED ORDER — DEXAMETHASONE SODIUM PHOSPHATE 10 MG/ML IJ SOLN
INTRAMUSCULAR | Status: AC
Start: 2016-08-15 — End: 2016-08-15
  Filled 2016-08-15: qty 1

## 2016-08-15 MED ORDER — ONDANSETRON HCL 4 MG/2ML IJ SOLN
INTRAMUSCULAR | Status: DC | PRN
  Administered 2016-08-15: 4 mg via INTRAVENOUS

## 2016-08-15 MED ORDER — ONDANSETRON HCL 4 MG/2ML IJ SOLN
INTRAMUSCULAR | Status: AC
  Filled 2016-08-15: qty 2

## 2016-08-15 MED ORDER — PHENYLEPHRINE 40 MCG/ML (10ML) SYRINGE FOR IV PUSH (FOR BLOOD PRESSURE SUPPORT)
PREFILLED_SYRINGE | INTRAVENOUS | Status: AC
  Filled 2016-08-15: qty 10

## 2016-08-15 MED ORDER — PHENYLEPHRINE HCL 10 MG/ML IJ SOLN
INTRAMUSCULAR | Status: AC
  Filled 2016-08-15: qty 1

## 2016-08-15 MED ORDER — DEXTROSE 5 % IV SOLN
INTRAVENOUS | Status: AC
  Filled 2016-08-15: qty 2

## 2016-08-15 MED ORDER — FENTANYL CITRATE (PF) 100 MCG/2ML IJ SOLN
INTRAMUSCULAR | Status: DC | PRN
  Administered 2016-08-15: 25 ug via INTRAVENOUS
  Administered 2016-08-15: 50 ug via INTRAVENOUS
  Administered 2016-08-15: 25 ug via INTRAVENOUS

## 2016-08-15 MED ORDER — SULFAMETHOXAZOLE-TRIMETHOPRIM 800-160 MG PO TABS: 1.0000 | ORAL_TABLET | Freq: Two times a day (BID) | ORAL | 0 refills | Status: DC

## 2016-08-15 MED ORDER — LIDOCAINE 2% (20 MG/ML) 5 ML SYRINGE
INTRAMUSCULAR | Status: DC | PRN
  Administered 2016-08-15: 100 mg via INTRAVENOUS

## 2016-08-15 MED ORDER — DEXAMETHASONE SODIUM PHOSPHATE 10 MG/ML IJ SOLN
INTRAMUSCULAR | Status: DC | PRN
  Administered 2016-08-15: 10 mg via INTRAVENOUS

## 2016-08-15 MED ORDER — SODIUM CHLORIDE 0.9 % IR SOLN
Status: DC | PRN
  Administered 2016-08-15: 1000 mL

## 2016-08-15 MED ORDER — ONDANSETRON HCL 4 MG/2ML IJ SOLN: 4.0000 mg | Freq: Once | INTRAMUSCULAR | Status: DC | PRN

## 2016-08-15 MED ORDER — DEXTROSE 5 % IV SOLN
2.0000 g | INTRAVENOUS | Status: AC
  Administered 2016-08-15: 2 g via INTRAVENOUS

## 2016-08-15 SURGICAL SUPPLY — 23 items
BAG URO CATCHER STRL LF (MISCELLANEOUS) ×3 IMPLANT
BASKET DAKOTA 1.9FR 11X120 (BASKET) ×1 IMPLANT
BASKET LASER NITINOL 1.9FR (BASKET) IMPLANT
BASKET STONE 1.7 NGAGE (UROLOGICAL SUPPLIES) ×2 IMPLANT
BSKT STON RTRVL 120 1.9FR (BASKET)
CATH FOLEY LATEX FREE 20FR (CATHETERS)
CATH FOLEY LF 20FR (CATHETERS) IMPLANT
CATH INTERMIT  6FR 70CM (CATHETERS) ×3 IMPLANT
CLOTH BEACON ORANGE TIMEOUT ST (SAFETY) ×3 IMPLANT
FIBER LASER TRAC TIP (UROLOGICAL SUPPLIES) ×2 IMPLANT
GLOVE BIO SURGEON STRL SZ8 (GLOVE) ×3 IMPLANT
GOWN STRL REUS W/TWL XL LVL3 (GOWN DISPOSABLE) ×5 IMPLANT
GUIDEWIRE ANG ZIPWIRE 038X150 (WIRE) ×3 IMPLANT
GUIDEWIRE STR DUAL SENSOR (WIRE) ×3 IMPLANT
MANIFOLD NEPTUNE II (INSTRUMENTS) ×3 IMPLANT
PACK CYSTO (CUSTOM PROCEDURE TRAY) ×3 IMPLANT
SHEATH ACCESS URETERAL 38CM (SHEATH) ×2 IMPLANT
SHEATH ACCESS URETERAL 54CM (SHEATH) ×1 IMPLANT
STENT CONTOUR 6FRX26X.038 (STENTS) ×2 IMPLANT
SYR CONTROL 10ML LL (SYRINGE) ×1 IMPLANT
TUBE FEEDING 8FR 16IN STR KANG (MISCELLANEOUS) ×1 IMPLANT
TUBING CONNECTING 10 (TUBING) ×2 IMPLANT
TUBING CONNECTING 10' (TUBING) ×1

## 2016-08-15 NOTE — OR Nursing (Signed)
Stone taken by Dr. McKenzie 

## 2016-08-15 NOTE — Discharge Instructions (Signed)
Ureteral Stent Implantation, Care After °Refer to this sheet in the next few weeks. These instructions provide you with information on caring for yourself after your procedure. Your health care provider may also give you more specific instructions. Your treatment has been planned according to current medical practices, but problems sometimes occur. Call your health care provider if you have any problems or questions after your procedure. °WHAT TO EXPECT AFTER THE PROCEDURE °You should be back to normal activity within 48 hours after the procedure. Nausea and vomiting may occur and are commonly the result of anesthesia. °It is common to experience sharp pain in the back or lower abdomen and penis with voiding. This is caused by movement of the ends of the stent with the act of urinating. It usually goes away within minutes after you have stopped urinating. °HOME CARE INSTRUCTIONS °Make sure to drink plenty of fluids. You may have small amounts of bleeding, causing your urine to be red. This is normal. Certain movements may trigger pain or a feeling that you need to urinate. You may be given medicines to prevent infection or bladder spasms. Be sure to take all medicines as directed. Only take over-the-counter or prescription medicines for pain, discomfort, or fever as directed by your health care provider. Do not take aspirin, as this can make bleeding worse. °Your stent will be left in until the blockage is resolved. This may take 2 weeks or longer, depending on the reason for stent implantation. You may have an X-ray exam to make sure your ureter is open and that the stent has not moved out of position (migrated). The stent can be removed by your health care provider in the office. Medicines may be given for comfort while the stent is being removed. Be sure to keep all follow-up appointments so your health care provider can check that you are healing properly. °SEEK MEDICAL CARE IF: °· You experience increasing  pain. °· Your pain medicine is not working. °SEEK IMMEDIATE MEDICAL CARE IF: °· Your urine is dark red or has blood clots. °· You are leaking urine (incontinent). °· You have a fever, chills, feeling sick to your stomach (nausea), or vomiting. °· Your pain is not relieved by pain medicine. °· The end of the stent comes out of the urethra. °· You are unable to urinate. °  °This information is not intended to replace advice given to you by your health care provider. Make sure you discuss any questions you have with your health care provider. °  °Document Released: 07/03/2013 Document Revised: 11/05/2013 Document Reviewed: 05/15/2015 °Elsevier Interactive Patient Education ©2016 [employer] ° °

## 2016-08-15 NOTE — Anesthesia Postprocedure Evaluation (Signed)
Anesthesia Post Note  Patient: DONNELLE MINCY  Procedure(s) Performed: Procedure(s) (LRB): CYSTOSCOPY WITH STENT REMOVAL,  RETROGRADES, URETEROSCOPY, STONE BASKETRY AND STENT REPLACEMENT (Left) HOLMIUM LASER (Left)  Patient location during evaluation: PACU Anesthesia Type: General Level of consciousness: awake and alert Pain management: pain level controlled Vital Signs Assessment: post-procedure vital signs reviewed and stable Respiratory status: spontaneous breathing, nonlabored ventilation, respiratory function stable and patient connected to nasal cannula oxygen Cardiovascular status: blood pressure returned to baseline and stable Postop Assessment: no signs of nausea or vomiting Anesthetic complications: no    Last Vitals:  Vitals:   08/15/16 1400 08/15/16 1415  BP: (!) 149/85 140/80  Pulse: 65 65  Resp: 13 14  Temp:  36.4 C    Last Pain:  Vitals:   08/15/16 1415  TempSrc:   PainSc: 0-No pain                 Kayler Buckholtz Eliyohu

## 2016-08-15 NOTE — Interval H&P Note (Signed)
History and Physical Interval Note:  08/15/2016 12:13 PM  Ethan Rose  has presented today for surgery, with the diagnosis of LEFT URETERAL CALCULUS  The various methods of treatment have been discussed with the patient and family. After consideration of risks, benefits and other options for treatment, the patient has consented to  Procedure(s): CYSTOSCOPY WITH URETEROSCOPY, STONE BASKETRY AND STENT REPLACEMENT (Left) HOLMIUM LASER (Left) as a surgical intervention .  The patient's history has been reviewed, patient examined, no change in status, stable for surgery.  I have reviewed the patient's chart and labs.  Questions were answered to the patient's satisfaction.     Nicolette Bang

## 2016-08-15 NOTE — Anesthesia Procedure Notes (Signed)
Procedure Name: LMA Insertion Date/Time: 08/15/2016 12:30 PM Performed by: Montel Clock Pre-anesthesia Checklist: Patient identified, Emergency Drugs available, Suction available, Patient being monitored and Timeout performed Patient Re-evaluated:Patient Re-evaluated prior to inductionOxygen Delivery Method: Circle system utilized Preoxygenation: Pre-oxygenation with 100% oxygen Intubation Type: IV induction Ventilation: Mask ventilation without difficulty LMA: LMA with gastric port inserted LMA Size: 4.0 Number of attempts: 1 Dental Injury: Teeth and Oropharynx as per pre-operative assessment

## 2016-08-15 NOTE — Anesthesia Preprocedure Evaluation (Signed)
Anesthesia Evaluation  Patient identified by MRN, date of birth, ID band Patient awake    Reviewed: Allergy & Precautions, NPO status , Patient's Chart, lab work & pertinent test results  Airway Mallampati: I  TM Distance: >3 FB Neck ROM: Full    Dental   Pulmonary former smoker,    Pulmonary exam normal        Cardiovascular hypertension, Pt. on medications Normal cardiovascular exam     Neuro/Psych    GI/Hepatic GERD  Medicated and Controlled,  Endo/Other    Renal/GU      Musculoskeletal   Abdominal   Peds  Hematology   Anesthesia Other Findings   Reproductive/Obstetrics                             Anesthesia Physical Anesthesia Plan  ASA: III  Anesthesia Plan: General   Post-op Pain Management:    Induction: Intravenous  Airway Management Planned: LMA  Additional Equipment:   Intra-op Plan:   Post-operative Plan: Extubation in OR  Informed Consent: I have reviewed the patients History and Physical, chart, labs and discussed the procedure including the risks, benefits and alternatives for the proposed anesthesia with the patient or authorized representative who has indicated his/her understanding and acceptance.     Plan Discussed with: CRNA and Surgeon  Anesthesia Plan Comments:         Anesthesia Quick Evaluation

## 2016-08-15 NOTE — Transfer of Care (Signed)
Immediate Anesthesia Transfer of Care Note  Patient: Ethan Rose  Procedure(s) Performed: Procedure(s): CYSTOSCOPY WITH STENT REMOVAL,  RETROGRADES, URETEROSCOPY, STONE BASKETRY AND STENT REPLACEMENT (Left) HOLMIUM LASER (Left)  Patient Location: PACU  Anesthesia Type:General  Level of Consciousness:  sedated, patient cooperative and responds to stimulation  Airway & Oxygen Therapy:Patient Spontanous Breathing and Patient connected to face mask oxgen  Post-op Assessment:  Report given to PACU RN and Post -op Vital signs reviewed and stable  Post vital signs:  Reviewed and stable  Last Vitals:  Vitals:   08/15/16 1003  BP: (!) 150/77  Pulse: 76  Resp: 20  Temp: 123XX123 C    Complications: No apparent anesthesia complications

## 2016-08-15 NOTE — H&P (View-Only) (Signed)
Urology Consult  Referring physician: Noemi Chapel, MD Reason for referral: ureteral calculus, sepsis  Chief Complaint: left flank pain  History of Present Illness: Ethan Rose is a 70yo with a hx of nephrolithiasis who presented to [facility] with fever of 102 and an 74m left ureteral calculus on CT. He has had 4 ESWLs in the past and passed 5 other calculi. [facility]. He developed severe, sharp, intermittent, nonradiating left flank pain 3 days ago and fever over 101 yesterday. WBC count is 19.7. He has associated nausea but no vomiting. No exacerbating/alleviating events  Past Medical History:  Diagnosis Date  . Allergic rhinitis   . Arthritis    carpal tunnel, L hand gout, arthritis - spine   . 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 Cervical myelopathy ([place]   . Chronic low back pain   . Essential hypertension   . GERD (gastroesophageal reflux disease)   . Hay fever   . Kidney calculi    Multiple passed in the past.   . Leukocytosis   . Neuromuscular disorder (HCC)    spondylolisthesis - lumbar region  . Olecranon bursitis December 02, 202017   Dr. MPercell Miller . Physical deconditioning   . Prediabetes 2015   HbA1c 6.4%: pt saw nutritionist  . Slow transit constipation   . 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 Vitamin D deficiency    Past Surgical History:  Procedure Laterality Date  . ABDOMINAL EXPOSURE N/A 03/24/2015   Procedure: ABDOMINAL EXPOSURE;  Surgeon: CAngelia Mould MD;  Location: [facility];  Service: Vascular;  Laterality: N/A;  left side approach  . ANTERIOR CERVICAL DECOMP/DISCECTOMY FUSION N/A 03/18/2016   Procedure: Cervical four-five Cervical five-six Cervical six-seven Anterior cervical decompression/diskectomy/fusion;  Surgeon: JErline Levine MD;  [place];  Service: Neurosurgery;  Laterality: N/A;  n  . ANTERIOR LAT LUMBAR FUSION Right 03/24/2015   Procedure: Right Lumbar  two-Three,Lumbar Three-Four,Lumbar Four-Five Anterior lateral lumbar interbody fusion ;  Surgeon: JErline Levine MD;  [place];  Service: Neurosurgery;  Laterality: Right;  right  . COLONOSCOPY  approx 2011   Dr. MEarlean Shawl(normal per pt report)  . HERNIA REPAIR  1993   Bilateral inguinal (urologist did these procedures)  . LITHOTRIPSY     Multiple; most recent was spring 2015  . POSTERIOR LUMBAR FUSION 4 LEVEL N/A 03/27/2015   Procedure: Posterior pedicle subtraction osteotomy with T10 to Iliac fusion with Dr. ARenaye Rakersassisting;  Surgeon: JErline Levine MD;  [facility];  Service: Neurosurgery;  Laterality: N/A;  Posterior pedicle subtraction osteotomy with T10 to Iliac fusion with Dr. ARenaye Rakersassisting  . TONSILLECTOMY  1954    Medications: I have reviewed the patient's current medications. Allergies: No Known Allergies  Family History  Problem Relation Age of Onset  . Stroke Mother   . Diabetes Mother   . Heart disease Mother   . Hypertension Mother   . Heart attack Mother   . Stroke Father   . Heart disease Father   . Diabetes Brother   . Hypertension Brother    Social History:  reports that he quit smoking about 2 years ago. His smoking use included Cigarettes. He smoked 0.50 packs per day. He has quit using smokeless tobacco. His smokeless tobacco use included Chew. He reports that he drinks about 4.8 oz of alcohol per week . He reports that he does not use drugs.  Review of Systems  Constitutional: Positive for chills and fever.  Gastrointestinal: Positive for nausea.  Genitourinary: Positive for dysuria, flank pain, frequency and urgency.  All other systems reviewed and are negative.   Physical Exam:  Vital signs in last 24 hours: Temp:  [98.4 F (36.9 C)-101.2 F (38.4 C)] 101.2 F (38.4 C) (09/02 1443) Pulse Rate:  [87-120] 120 (09/02 1443) Resp:  [13-19] 13 (09/02 1316) BP: (103-115)/(68-78) 115/77 (09/02 1316) SpO2:  [96 %-100 %]  100 % (09/02 1316) Weight:  [70.8 kg (156 lb)] 70.8 kg (156 lb) (09/02 1234) Physical Exam  Constitutional: He is oriented to person, place, and time. He appears well-developed and well-nourished.  HENT:  Head: Normocephalic and atraumatic.  Eyes: EOM are normal. Pupils are equal, round, and reactive to light.  Neck: Normal range of motion. No thyromegaly present.  Cardiovascular: Normal rate and regular rhythm.   Respiratory: Effort normal. No respiratory distress.  GI: Soft. He exhibits no distension. Hernia confirmed negative in the right inguinal area and confirmed negative in the left inguinal area.  Genitourinary: Testes normal and penis normal.  Musculoskeletal: Normal range of motion.  Lymphadenopathy:       Right: No inguinal adenopathy present.       Left: No inguinal adenopathy present.  Neurological: He is alert and oriented to person, place, and time.  Skin: Skin is warm and dry.  Psychiatric: He has a normal mood and affect. His behavior is normal. Judgment and thought content normal.    Laboratory Data:  Results for orders placed or performed during the hospital encounter of 07/16/16 (from the past 72 hour(s))  Urinalysis, Routine w reflex microscopic (not at [facility])     Status: Abnormal   Collection Time: 07/16/16 11:30 AM  Result Value Ref Range   Color, Urine YELLOW YELLOW   APPearance TURBID (A) CLEAR   Specific Gravity, Urine 1.021 1.005 - 1.030   pH 5.5 5.0 - 8.0   Glucose, UA 100 (A) NEGATIVE mg/dL   Hgb urine dipstick LARGE (A) NEGATIVE   Bilirubin Urine NEGATIVE NEGATIVE   Ketones, ur 15 (A) NEGATIVE mg/dL   Protein, ur 30 (A) NEGATIVE mg/dL   Nitrite POSITIVE (A) NEGATIVE   Leukocytes, UA LARGE (A) NEGATIVE  Urine microscopic-add on     Status: Abnormal   Collection Time: 07/16/16 11:30 AM  Result Value Ref Range   Squamous Epithelial / LPF 0-5 (A) NONE SEEN   WBC, UA TOO NUMEROUS TO COUNT 0 - 5 WBC/hpf   RBC / HPF 6-30 0 - 5 RBC/hpf   Bacteria, UA  MANY (A) NONE SEEN  Comprehensive metabolic panel     Status: Abnormal   Collection Time: 07/16/16 12:00 PM  Result Value Ref Range   Sodium 130 (L) 135 - 145 mmol/L   Potassium 3.7 3.5 - 5.1 mmol/L   Chloride 96 (L) 101 - 111 mmol/L   CO2 21 (L) 22 - 32 mmol/L   Glucose, Bld 258 (H) 65 - 99 mg/dL   BUN 41 (H) 6 - 20 mg/dL   Creatinine, Ser 1.29 (H) 0.61 - 1.24 mg/dL   Calcium 8.7 (L) 8.9 - 10.3 mg/dL   Total Protein 6.5 6.5 - 8.1 g/dL   Albumin 3.4 (L) 3.5 - 5.0 g/dL   AST 41 15 - 41 U/L   ALT 27 17 - 63 U/L   Alkaline Phosphatase 110 38 - 126 U/L   Total Bilirubin 0.8 0.3 - 1.2 mg/dL   GFR calc  non Af Amer 55 (L) >60 mL/min   GFR calc Af Amer >60 >60 mL/min    Comment: (NOTE) The eGFR has been calculated using the CKD EPI equation. This calculation has not been validated in all clinical situations. eGFR's persistently <60 mL/min signify possible Chronic Kidney Disease.    Anion gap 13 5 - 15  CBC WITH DIFFERENTIAL     Status: Abnormal   Collection Time: 07/16/16 12:00 PM  Result Value Ref Range   WBC 19.7 (H) 4.0 - 10.5 K/uL   RBC 4.32 4.22 - 5.81 MIL/uL   Hemoglobin 13.3 13.0 - 17.0 g/dL   HCT 38.2 (L) 39.0 - 52.0 %   MCV 88.4 78.0 - 100.0 fL   MCH 30.8 26.0 - 34.0 pg   MCHC 34.8 30.0 - 36.0 g/dL   RDW 14.9 11.5 - 15.5 %   Platelets 135 (L) 150 - 400 K/uL   Neutrophils Relative % 90 %   Lymphocytes Relative 2 %   Monocytes Relative 2 %   Eosinophils Relative 0 %   Basophils Relative 0 %   Band Neutrophils 6 %   Neutro Abs 18.9 (H) 1.7 - 7.7 K/uL   Lymphs Abs 0.4 (L) 0.7 - 4.0 K/uL   Monocytes Absolute 0.4 0.1 - 1.0 K/uL   Eosinophils Absolute 0.0 0.0 - 0.7 K/uL   Basophils Absolute 0.0 0.0 - 0.1 K/uL  I-Stat CG4 Lactic Acid, ED     Status: Abnormal   Collection Time: 07/16/16 12:05 PM  Result Value Ref Range   Lactic Acid, Venous 4.81 (HH) 0.5 - 1.9 mmol/L   Comment NOTIFIED PHYSICIAN    No results found for this or any previous visit (from the past 240  hour(s)). Creatinine:  Recent Labs  07/16/16 1200  CREATININE 1.29*   Baseline Creatinine: 1  Impression/Assessment:  70yo with left ureteral calculus and sepsis  Plan:  1. The risks/benefits/alternatives to left ureteral stent placement was explaiend to the patient and he understands and wishes to proceed with surgery 2. Pt will be admitted post op to hospitalist service for IV antibiotics  Nicolette Bang 07/16/2016, 4:30 PM

## 2016-08-17 DIAGNOSIS — Z4789 Encounter for other orthopedic aftercare: Secondary | ICD-10-CM | POA: Diagnosis not present

## 2016-08-17 DIAGNOSIS — Z48816 Encounter for surgical aftercare following surgery on the genitourinary system: Secondary | ICD-10-CM | POA: Diagnosis not present

## 2016-08-17 DIAGNOSIS — G609 Hereditary and idiopathic neuropathy, unspecified: Secondary | ICD-10-CM | POA: Diagnosis not present

## 2016-08-17 DIAGNOSIS — M6281 Muscle weakness (generalized): Secondary | ICD-10-CM | POA: Diagnosis not present

## 2016-08-17 DIAGNOSIS — R278 Other lack of coordination: Secondary | ICD-10-CM | POA: Diagnosis not present

## 2016-08-18 DIAGNOSIS — G609 Hereditary and idiopathic neuropathy, unspecified: Secondary | ICD-10-CM | POA: Diagnosis not present

## 2016-08-18 DIAGNOSIS — Z48816 Encounter for surgical aftercare following surgery on the genitourinary system: Secondary | ICD-10-CM | POA: Diagnosis not present

## 2016-08-18 DIAGNOSIS — M6281 Muscle weakness (generalized): Secondary | ICD-10-CM | POA: Diagnosis not present

## 2016-08-18 DIAGNOSIS — R278 Other lack of coordination: Secondary | ICD-10-CM | POA: Diagnosis not present

## 2016-08-18 DIAGNOSIS — Z4789 Encounter for other orthopedic aftercare: Secondary | ICD-10-CM | POA: Diagnosis not present

## 2016-08-19 DIAGNOSIS — Z4789 Encounter for other orthopedic aftercare: Secondary | ICD-10-CM | POA: Diagnosis not present

## 2016-08-19 DIAGNOSIS — Z48816 Encounter for surgical aftercare following surgery on the genitourinary system: Secondary | ICD-10-CM | POA: Diagnosis not present

## 2016-08-19 DIAGNOSIS — M6281 Muscle weakness (generalized): Secondary | ICD-10-CM | POA: Diagnosis not present

## 2016-08-19 DIAGNOSIS — G609 Hereditary and idiopathic neuropathy, unspecified: Secondary | ICD-10-CM | POA: Diagnosis not present

## 2016-08-19 DIAGNOSIS — R278 Other lack of coordination: Secondary | ICD-10-CM | POA: Diagnosis not present

## 2016-08-20 NOTE — Op Note (Signed)
.  Preoperative diagnosis: Left ureteral stone  Postoperative diagnosis: Same  Procedure: 1 cystoscopy 2. Left retrograde pyelography 3.  Intraoperative fluoroscopy, under one hour, with interpretation 4.  Left ureteroscopic stone manipulation with laser lithotripsy 5.  Left 6 x 26 JJ stent exchange  Attending: Rosie Fate  Anesthesia: General  Estimated blood loss: None  Drains: Left 6 x 26 JJ ureteral stent with tether  Specimens: stone for analysis  Antibiotics: rocephin  Findings: left proximal ureteral stone. No hydronephrosis. No masses/lesions in the bladder. Ureteral orifices in normal anatomic location.  Indications: Patient is a 70 year old male with a history of left ureteral stone and who had a stent placed for sepsis.  After discussing treatment options, he decided proceed with left ureteroscopic stone manipulation.  Procedure her in detail: The patient was brought to the operating room and a brief timeout was done to ensure correct patient, correct procedure, correct site.  General anesthesia was administered patient was placed in dorsal lithotomy position.  Her genitalia was then prepped and draped in usual sterile fashion.  A rigid 11 French cystoscope was passed in the urethra and the bladder.  Bladder was inspected free masses or lesions.  the ureteral orifices were in the normal orthotopic locations. Using a grasper the stent was brought to the urethral meatus and a zipwire was then advanced through the stent and up to the renal pelvis. The stent was then removed.  a 6 french ureteral catheter was then instilled into the left ureteral orifice.  we then removed the cystoscope and cannulated the left ureteral orifice with a semirigid ureteroscope.  We encountered the stone in the proximal ureter. using a 200 nm laser fiber and fragmented the stone into smaller pieces.  the pieces were then removed with a Ngage basket.  once all stone fragments were removed we then  placed a 6 x 26 double-j ureteral stent over the original zip wire. We then removed the wire and good coil was noted in the the renal pelvis under fluoroscopy and the bladder under direct vision.     the stone fragments were then removed from the bladder and sent for analysis.   the bladder was then drained and this concluded the procedure which was well tolerated by patient.  Complications: None  Condition: Stable, extubated, transferred to PACU  Plan: Patient is to be discharged home as to follow-up in one week. He is to remove his stent by pulling the tether in 72 hours

## 2016-08-22 DIAGNOSIS — M6281 Muscle weakness (generalized): Secondary | ICD-10-CM | POA: Diagnosis not present

## 2016-08-22 DIAGNOSIS — R278 Other lack of coordination: Secondary | ICD-10-CM | POA: Diagnosis not present

## 2016-08-22 DIAGNOSIS — N200 Calculus of kidney: Secondary | ICD-10-CM | POA: Diagnosis not present

## 2016-08-22 DIAGNOSIS — N401 Enlarged prostate with lower urinary tract symptoms: Secondary | ICD-10-CM | POA: Diagnosis not present

## 2016-08-22 DIAGNOSIS — R351 Nocturia: Secondary | ICD-10-CM | POA: Diagnosis not present

## 2016-08-22 DIAGNOSIS — G609 Hereditary and idiopathic neuropathy, unspecified: Secondary | ICD-10-CM | POA: Diagnosis not present

## 2016-08-22 DIAGNOSIS — Z48816 Encounter for surgical aftercare following surgery on the genitourinary system: Secondary | ICD-10-CM | POA: Diagnosis not present

## 2016-08-22 DIAGNOSIS — Z4789 Encounter for other orthopedic aftercare: Secondary | ICD-10-CM | POA: Diagnosis not present

## 2016-08-23 ENCOUNTER — Encounter: Payer: Self-pay | Admitting: Family Medicine

## 2016-08-23 ENCOUNTER — Ambulatory Visit (INDEPENDENT_AMBULATORY_CARE_PROVIDER_SITE_OTHER): Payer: Medicare Other | Admitting: Family Medicine

## 2016-08-23 VITALS — BP 150/82 | HR 98 | Temp 97.6°F | Resp 18

## 2016-08-23 DIAGNOSIS — R030 Elevated blood-pressure reading, without diagnosis of hypertension: Secondary | ICD-10-CM

## 2016-08-23 DIAGNOSIS — A4151 Sepsis due to Escherichia coli [E. coli]: Secondary | ICD-10-CM

## 2016-08-23 DIAGNOSIS — Z1211 Encounter for screening for malignant neoplasm of colon: Secondary | ICD-10-CM | POA: Diagnosis not present

## 2016-08-23 DIAGNOSIS — N201 Calculus of ureter: Secondary | ICD-10-CM | POA: Diagnosis not present

## 2016-08-23 DIAGNOSIS — G609 Hereditary and idiopathic neuropathy, unspecified: Secondary | ICD-10-CM | POA: Diagnosis not present

## 2016-08-23 DIAGNOSIS — Z48816 Encounter for surgical aftercare following surgery on the genitourinary system: Secondary | ICD-10-CM | POA: Diagnosis not present

## 2016-08-23 DIAGNOSIS — R278 Other lack of coordination: Secondary | ICD-10-CM | POA: Diagnosis not present

## 2016-08-23 DIAGNOSIS — M6281 Muscle weakness (generalized): Secondary | ICD-10-CM | POA: Diagnosis not present

## 2016-08-23 DIAGNOSIS — Z4789 Encounter for other orthopedic aftercare: Secondary | ICD-10-CM | POA: Diagnosis not present

## 2016-08-23 NOTE — Patient Instructions (Signed)
Check your blood pressure and heart rate three times per week and we'll review these numbers at your next follow up visit in 2 months.

## 2016-08-23 NOTE — Progress Notes (Signed)
OFFICE VISIT  08/23/2016   CC:  Chief Complaint  Patient presents with  . Hospitalization Follow-up    kidney stone, septic    HPI:    Patient is a 70 y.o. Caucasian male who presents for f/u hospitalization for sepsis from UTI with left ureteral stone with hydronephrosis.  Urine and blood were + for e coli, abx narrowed to ceftriaxone and d/c'd from hosp on bactrim.  His stone was extracted during the hospitalization by urologist and ureteral stent was placed.    He was discharged to CIR for follow up therapy b/c of his ongoing problems with chronic back pain, cervical myelopathy, and bilat foot drop.    He has had subsequent f/u with urol: got another stent put in and stones removed, then he pulled this stent out on his own per urologist's instructions.  Was put on med to treat uric acid stones---stone analysis pending, though.  Has f/u to recheck blood and get u/s repeat-- set up already ([facility]-no records at this time). He was started on flomax last night.  No gross hematuria, no flank pain, no suprapubic pain.  No dysuria.  Therapist at his home yesterday checked bp and it was 140/70.  At urologist yest: 150s/80s. He says he has never been on bp med in the past. No HAs, no dizziness, no palpitations, no CP, no SOB.  Past Medical History:  Diagnosis Date  . Allergic rhinitis   . Arthritis    carpal tunnel, L hand gout, arthritis - spine   . 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 Cervical myelopathy ([place])   . Chronic low back pain   . Essential hypertension   . GERD (gastroesophageal reflux disease)   . Hay fever   . Heart murmur    in childhood  . History of urinary tract infection   . Kidney calculi    Multiple passed in the past.   . Leukocytosis   . Neuromuscular disorder (HCC)    spondylolisthesis - lumbar region  . Olecranon bursitis 10/07/202017   Dr. Percell Miller  . Physical deconditioning   . Pre-diabetes   . Prediabetes 2015   HbA1c 6.4%: pt  saw nutritionist  . Slow transit constipation   . 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 Vitamin D deficiency     Past Surgical History:  Procedure Laterality Date  . ABDOMINAL EXPOSURE N/A 03/24/2015   Procedure: ABDOMINAL EXPOSURE;  Surgeon: [name], MD;  Location: [facility];  Service: Vascular;  Laterality: N/A;  left side approach  . ANTERIOR CERVICAL DECOMP/DISCECTOMY FUSION N/A 03/18/2016   Procedure: Cervical four-five Cervical five-six Cervical six-seven Anterior cervical decompression/diskectomy/fusion;  Surgeon: [name], MD;  Location: [place] NEURO ORS;  Service: Neurosurgery;  Laterality: N/A;  n  . ANTERIOR LAT LUMBAR FUSION Right 03/24/2015   Procedure: Right Lumbar two-Three,Lumbar Three-Four,Lumbar Four-Five Anterior lateral lumbar interbody fusion ;  Surgeon: [name], MD;  Location: [place] NEURO ORS;  Service: Neurosurgery;  Laterality: Right;  right  . COLONOSCOPY  approx 2011   Dr. [name] (normal per pt report)  . CYSTOSCOPY WITH RETROGRADE PYELOGRAM, URETEROSCOPY AND STENT PLACEMENT Left 08/15/2016   Procedure: CYSTOSCOPY WITH STENT REMOVAL,  RETROGRADES, URETEROSCOPY, STONE BASKETRY AND STENT REPLACEMENT;  Surgeon: [name], MD;  Location: [facility];  Service: Urology;  Laterality: Left;  . CYSTOSCOPY WITH STENT PLACEMENT Left 07/16/2016   Procedure: CYSTOSCOPY, RETROGRADE WITH LEFT URETERAL STENT PLACEMENT;  Surgeon: [name],  MD;  Location: [facility];  Service: Urology;  Laterality: Left;  . HERNIA REPAIR  1993   Bilateral inguinal (urologist did these procedures)  . HOLMIUM LASER APPLICATION Left [id number]   Procedure: HOLMIUM LASER;  Surgeon: [name], MD;  Location: [facility];  Service: Urology;  Laterality: Left;  . LITHOTRIPSY     Multiple; most recent was spring 2015  . POSTERIOR LUMBAR FUSION 4 LEVEL N/A 03/27/2015   Procedure: Posterior pedicle subtraction osteotomy with T10 to Iliac fusion with Dr. [name]  assisting;  Surgeon: [name], MD;  Location: [facility]S;  Service: Neurosurgery;  Laterality: N/A;  Posterior pedicle subtraction osteotomy with T10 to Iliac fusion with Dr. [name] assisting  . TONSILLECTOMY  1954    Outpatient Medications Prior to Visit  Medication Sig Dispense Refill  . Multiple Vitamins-Minerals (MULTIVITAMIN WITH MINERALS) tablet Take 1 tablet by mouth daily.    Marland Kitchen OVER THE COUNTER MEDICATION Take 1 Dose by mouth daily. Over the counter OPC3 and Isochrome powders 1 capful of both mixed in 4 ounces of water daily    . OVER THE COUNTER MEDICATION Take 1 capsule by mouth 2 (two) times daily. Nerve renew    . OVER THE COUNTER MEDICATION Take 1 capsule by mouth daily. neuroquell    . OVER THE COUNTER MEDICATION Take 1 tablet by mouth 2 (two) times daily. Public relations account executive    . OVER THE COUNTER MEDICATION Take 1 tablet by mouth daily. BP Platinum    . PAPAYA PO Take 3 tablets by mouth daily as needed (heartburn).     . Probiotic Product (PROBIOTIC PO) Take 50 mg by mouth daily.    . Vitamin D, Ergocalciferol, (DRISDOL) 50000 units CAPS capsule Take 1 capsule (50,000 Units total) by mouth every 7 (seven) days. 4 capsule 0  . Zinc Methionate 50 MG CAPS Take 50 mg by mouth daily.    . Cholecalciferol 5000 UNITS capsule Take 5,000 Units by mouth daily.    Marland Kitchen oxyCODONE-acetaminophen (ROXICET) 5-325 MG tablet Take 1 tablet by mouth every 4 (four) hours as needed for severe pain. (Patient not taking: Reported on 08/23/2016) 30 tablet 0  . sulfamethoxazole-trimethoprim (BACTRIM DS,SEPTRA DS) 800-160 MG tablet Take 1 tablet by mouth 2 (two) times daily. (Patient not taking: Reported on 08/23/2016) 6 tablet 0   No facility-administered medications prior to visit.     No Known Allergies  ROS As per HPI  PE: Blood pressure (!) 150/82, pulse 98, temperature 97.6 F (36.4 C), temperature source Oral, resp. rate 18, SpO2 97 %. Gen: Alert, well appearing, sitting in wheelchair.   Patient is oriented to person, place, time, and situation. CV: RRR, no m/r/g.   LUNGS: CTA bilat, nonlabored resps, good aeration in all lung fields. ABD: soft, NT/ND. EXT: 2 + pitting edema in both ankles, R>L.  LABS:  Lab Results  Component Value Date   TSH 1.52 04/16/2014   Lab Results  Component Value Date   WBC 8.4 08/04/2016   HGB 11.9 (L) 08/04/2016   HCT 36.6 (L) 08/04/2016   MCV 88.4 08/04/2016   PLT 412 (H) 08/04/2016   Lab Results  Component Value Date   CREATININE 0.50 (L) 08/04/2016   BUN 17 08/04/2016   NA 138 08/04/2016   K 4.0 08/04/2016   CL 103 08/04/2016   CO2 25 08/04/2016   Lab Results  Component Value Date   ALT 30 07/23/2016   AST 24 07/23/2016   ALKPHOS 94  07/23/2016   BILITOT 0.6 07/23/2016   Lab Results  Component Value Date   CHOL 183 04/16/2014   Lab Results  Component Value Date   HDL 47.10 04/16/2014   Lab Results  Component Value Date   LDLCALC 123 (H) 04/16/2014   Lab Results  Component Value Date   TRIG 66.0 04/16/2014   Lab Results  Component Value Date   CHOLHDL 4 04/16/2014   Lab Results  Component Value Date   PSA 0.69 04/16/2014   PSA 0.49 04/29/2013   IMPRESSION AND PLAN:  1) Urosepsis hospitalization: finished 10d of bactrim post-d/c from acute care hospital.   No s/s of UTI at this time.  2) Nephrolithiasis: hx of ureteral stones, recently with obst/hydro.  He has had stones extracted and ureteral stents, and is currently w/out any stent in place.  He is on samples of a med for uric acid stone tx but he can't recall the name of it.  He has appropriate urologic f/u.  I'll try to get records.  3) Elevated blood pressure w/out dx of HTN: encouraged pt to monitor bp at home 3 times per week and we'll review his numbers at office f/u in 2 mo---no meds started at this time.  4) Colon cancer screening: he asked about cologuard.  He is low risk (no FH and no personal history of colon polyps).  Ordered cologuard  today.  An After Visit Summary was printed and given to the patient.  FOLLOW UP: Return in about 2 months (around 10/23/2016) for f/u elev bp w/out dx of HTN.  Signed:  Crissie Sickles, MD           08/23/2016

## 2016-08-23 NOTE — Progress Notes (Signed)
Pre visit review using our clinic review tool, if applicable. No additional management support is needed unless otherwise documented below in the visit note. 

## 2016-08-24 ENCOUNTER — Encounter: Payer: Medicare Other | Attending: Physical Medicine & Rehabilitation | Admitting: Physical Medicine & Rehabilitation

## 2016-08-24 ENCOUNTER — Encounter: Payer: Self-pay | Admitting: Physical Medicine & Rehabilitation

## 2016-08-24 VITALS — BP 122/79 | HR 83 | Resp 14

## 2016-08-24 DIAGNOSIS — M21371 Foot drop, right foot: Secondary | ICD-10-CM | POA: Insufficient documentation

## 2016-08-24 DIAGNOSIS — R7881 Bacteremia: Secondary | ICD-10-CM | POA: Insufficient documentation

## 2016-08-24 DIAGNOSIS — N390 Urinary tract infection, site not specified: Secondary | ICD-10-CM | POA: Diagnosis not present

## 2016-08-24 DIAGNOSIS — Z87891 Personal history of nicotine dependence: Secondary | ICD-10-CM | POA: Insufficient documentation

## 2016-08-24 DIAGNOSIS — M21372 Foot drop, left foot: Secondary | ICD-10-CM | POA: Insufficient documentation

## 2016-08-24 DIAGNOSIS — M7022 Olecranon bursitis, left elbow: Secondary | ICD-10-CM | POA: Diagnosis not present

## 2016-08-24 DIAGNOSIS — G959 Disease of spinal cord, unspecified: Secondary | ICD-10-CM | POA: Diagnosis not present

## 2016-08-24 DIAGNOSIS — Z79899 Other long term (current) drug therapy: Secondary | ICD-10-CM | POA: Insufficient documentation

## 2016-08-24 DIAGNOSIS — B9620 Unspecified Escherichia coli [E. coli] as the cause of diseases classified elsewhere: Secondary | ICD-10-CM | POA: Diagnosis not present

## 2016-08-24 DIAGNOSIS — M4316 Spondylolisthesis, lumbar region: Secondary | ICD-10-CM

## 2016-08-24 DIAGNOSIS — Z0001 Encounter for general adult medical examination with abnormal findings: Secondary | ICD-10-CM | POA: Diagnosis not present

## 2016-08-24 NOTE — Progress Notes (Signed)
Subjective:    Patient ID: Ethan Rose, male    DOB: 12-22-1945, 70 y.o.   MRN: [id number]  HPI   Ethan Rose is here in follow up of his rehab stay. He has been home for about a week. His left leg buckled when he was getting out of the shower. His bathroom is tight for the w/c. HH therapy is working with him on leg strength and balance.   He denies pain. He has had some swelling  Pain Inventory Average Pain 0 Pain Right Now 0 My pain is no pain  In the last 24 hours, has pain interfered with the following? General activity 0 Relation with others 0 Enjoyment of life 0 What TIME of day is your pain at its worst? no pain Sleep (in general) Fair  Pain is worse with: no pain Pain improves with: no pain Relief from Meds: no pain  Mobility walk with assistance use a walker how many minutes can you walk? 2 ability to climb steps?  no do you drive?  no use a wheelchair transfers alone Do you have any goals in this area?  yes  Function not employed: date last employed . retired I need assistance with the following:  shopping Do you have any goals in this area?  yes  Neuro/Psych weakness trouble walking  Prior Studies hospital f/u  Physicians involved in your care hospital f/u   Family History  Problem Relation Age of Onset  . Stroke Mother   . Diabetes Mother   . Heart disease Mother   . Hypertension Mother   . Heart attack Mother   . Stroke Father   . Heart disease Father   . Diabetes Brother   . Hypertension Brother    Social History   Social History  . Marital status: Widowed    Spouse name: N/A  . Number of children: N/A  . Years of education: N/A   Social History Main Topics  . Smoking status: Former Smoker    Packs/day: 1.00    Years: 50.00    Types: Cigarettes    Quit date: 02/12/2014  . Smokeless tobacco: Never Used  . Alcohol use 4.8 oz/week    8 Cans of beer per week     Comment: 1-2 beers q day   . Drug use: No  . Sexual activity: No    Other Topics Concern  . None   Social History Narrative   Widower as of 59 (wife [name] d. [place]), has one daughter and one grandson.   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   Orig from [place].  Has lived in [place] x 25 yrs.   Tobacco 50 pack-yr hx--Quit 02/2014.     Alcohol: 2 beers per day avg.  No drugs.   Exercise: none.  Has active job/physical labor.   Admitted to [facility] 03/2016   FULL CODE   Past Surgical History:  Procedure Laterality Date  . ABDOMINAL EXPOSURE N/A 03/24/2015   Procedure: ABDOMINAL EXPOSURE;  Surgeon: [name], MD;  Location: [facility] NEURO ORS;  Service: Vascular;  Laterality: N/A;  left side approach  . ANTERIOR CERVICAL DECOMP/DISCECTOMY FUSION N/A 03/18/2016   Procedure: Cervical four-five Cervical five-six Cervical six-seven Anterior cervical decompression/diskectomy/fusion;  Surgeon: [name], MD;  Location: [place] NEURO ORS;  Service: Neurosurgery;  Laterality: N/A;  n  . ANTERIOR LAT LUMBAR FUSION Right 03/24/2015   Procedure: Right Lumbar two-Three,Lumbar Three-Four,Lumbar Four-Five Anterior lateral lumbar interbody fusion ;  Surgeon: [name], MD;  Location: [place] NEURO ORS;  Service: Neurosurgery;  Laterality: Right;  right  . COLONOSCOPY  approx 2011   Dr. [name] (normal per pt report)  . CYSTOSCOPY WITH RETROGRADE PYELOGRAM, URETEROSCOPY AND STENT PLACEMENT Left 08/15/2016   Procedure: CYSTOSCOPY WITH STENT REMOVAL,  RETROGRADES, URETEROSCOPY, STONE BASKETRY AND STENT REPLACEMENT;  Surgeon: [name], MD;  Location: [facility] ORS;  Service: Urology;  Laterality: Left;  . CYSTOSCOPY WITH STENT PLACEMENT Left 07/16/2016   Procedure: CYSTOSCOPY, RETROGRADE WITH LEFT URETERAL STENT PLACEMENT;  Surgeon: [name], MD;  Location: [facility] ORS;  Service: Urology;  Laterality: Left;  . HERNIA REPAIR  1993   Bilateral inguinal (urologist did these procedures)  . HOLMIUM LASER APPLICATION Left [id number]   Procedure: HOLMIUM LASER;  Surgeon: [name], MD;  Location: [facility] ORS;  Service: Urology;  Laterality: Left;  . LITHOTRIPSY     Multiple; most recent was spring 2015  . POSTERIOR LUMBAR FUSION 4 LEVEL N/A 03/27/2015   Procedure: Posterior pedicle subtraction osteotomy with T10 to Iliac fusion with Dr. [name] assisting;  Surgeon: [name], MD;  Location: [facility] NEURO ORS;  Service: Neurosurgery;  Laterality: N/A;  Posterior pedicle subtraction osteotomy with T10 to Iliac fusion with Dr. [name] assisting  . TONSILLECTOMY  1954   Past Medical History:  Diagnosis Date  . Allergic rhinitis   . Arthritis    carpal tunnel, L hand gout, arthritis - spine   . 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 Cervical myelopathy ([place])   . Chronic low back pain   . Essential hypertension   . GERD (gastroesophageal reflux disease)   . Hay fever   . Heart murmur    in childhood  . History of urinary tract infection   . Kidney calculi    Multiple passed in the past.   . Leukocytosis   . Neuromuscular disorder (HCC)    spondylolisthesis - lumbar region  . Olecranon bursitis 01/07/202017   Dr. Percell Miller  . Physical deconditioning   . Pre-diabetes   . Prediabetes 2015   HbA1c 6.4%: pt saw nutritionist  . Slow transit constipation   . 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 Vitamin D deficiency    BP 122/79 (BP Location: Right Arm, Patient Position: Sitting, Cuff Size: Normal)   Pulse 83   Resp 14   SpO2 98%   Opioid Risk Score:   Fall Risk Score:  `1  Depression screen PHQ 2/9  Depression screen PHQ 2/9 08/23/2016  Decreased Interest 0  Down, Depressed, Hopeless 0  PHQ - 2 Score 0    Review of Systems  HENT: Negative.   Respiratory: Negative.   Cardiovascular: Negative.   Gastrointestinal: Negative.   Endocrine: Negative.   Genitourinary: Positive for frequency.  Musculoskeletal: Positive for gait problem.  Skin: Negative.   Allergic/Immunologic: Negative.   Neurological: Positive for weakness.    Hematological: Negative.   Psychiatric/Behavioral: Negative.   All other systems reviewed and are negative.      Objective:   Physical Exam  Constitutional: He appears well-developedand well-nourished.   HENT: Normocephalic. Atraumatic. Eyes: Conj and EOM intact.  Cardiovascular: Normal rate, regular rhythmand normal heart sounds.  Respiratory: Effort normal, breath sounds normal.  GI: Soft. Nontender. Genitourinary: n/a Musculoskeletal: He exhibits no edemaor deformity.  -golf ball sized fluid collection left olecranon Neurological: He is alertand oriented.  Motor 4+/5 proximal to distal in the UE's.  B/l LE: HF 3- to  3/5, KE 3+/5, APF 4/5, ADF remains trace.  Can stand up unassisted but still struggles with standing balance while using AFO's. Knees tend to buckle and hips flex. Skin: Skin is warmand dry.  Psychiatric: He has a normal mood and affect. His behavior is normal. Judgmentand thought contentnormal.        Assessment & Plan:  Medical Problem List and Plan: 1. Mobility and functional deficitssecondary to debility after multiple medical complications             -continue HH PT for balance and strength  -advance to outpt therapies soon  -encourage use of w/c at home when alone. 2. Skin/Wound Care: encourage nutrition.              -local skin care as necessary  -fall risk assessment 3. Fluids/Electrolytes/Nutrition:             -encourage PO 4. E coli bacteremia/UTI: Continue to monitor for temps/symptoms rather that WBC per ID. Bactrim for 10 days---completed                           -stone retrieval after   urology (10/5)   5. Bilateral foot drop: appears to be related to his lumbar Scoliosis/stenosis/radiculopathy although records seem a bit inconsistent in reference to his bilateral foot drop. He does have a history of cervical stenosis, myelopathy, and subsequent surgery as wel.             -AFO/shoewear functional 6. Left olecranon  bursitis---             -continue pressure relief--continue elbow pad             -much improved   Follow up in 2 months. Thirty minutes of face to face patient care time were spent during this visit. All questions were encouraged and answered.     LOS (Days) 19 A FACE TO FACE EVALUATION WAS PERFORMED

## 2016-08-24 NOTE — Patient Instructions (Signed)
PLEASE CALL ME WITH ANY PROBLEMS OR QUESTIONS ([phone])  

## 2016-08-25 DIAGNOSIS — Z48816 Encounter for surgical aftercare following surgery on the genitourinary system: Secondary | ICD-10-CM | POA: Diagnosis not present

## 2016-08-25 DIAGNOSIS — Z4789 Encounter for other orthopedic aftercare: Secondary | ICD-10-CM | POA: Diagnosis not present

## 2016-08-25 DIAGNOSIS — G609 Hereditary and idiopathic neuropathy, unspecified: Secondary | ICD-10-CM | POA: Diagnosis not present

## 2016-08-25 DIAGNOSIS — R278 Other lack of coordination: Secondary | ICD-10-CM | POA: Diagnosis not present

## 2016-08-25 DIAGNOSIS — M6281 Muscle weakness (generalized): Secondary | ICD-10-CM | POA: Diagnosis not present

## 2016-08-26 DIAGNOSIS — M6281 Muscle weakness (generalized): Secondary | ICD-10-CM | POA: Diagnosis not present

## 2016-08-26 DIAGNOSIS — R278 Other lack of coordination: Secondary | ICD-10-CM | POA: Diagnosis not present

## 2016-08-26 DIAGNOSIS — Z4789 Encounter for other orthopedic aftercare: Secondary | ICD-10-CM | POA: Diagnosis not present

## 2016-08-26 DIAGNOSIS — Z48816 Encounter for surgical aftercare following surgery on the genitourinary system: Secondary | ICD-10-CM | POA: Diagnosis not present

## 2016-08-26 DIAGNOSIS — G609 Hereditary and idiopathic neuropathy, unspecified: Secondary | ICD-10-CM | POA: Diagnosis not present

## 2016-08-27 ENCOUNTER — Encounter: Payer: Self-pay | Admitting: Family Medicine

## 2016-08-29 ENCOUNTER — Telehealth: Payer: Self-pay | Admitting: Family Medicine

## 2016-08-29 DIAGNOSIS — R278 Other lack of coordination: Secondary | ICD-10-CM | POA: Diagnosis not present

## 2016-08-29 DIAGNOSIS — G609 Hereditary and idiopathic neuropathy, unspecified: Secondary | ICD-10-CM | POA: Diagnosis not present

## 2016-08-29 DIAGNOSIS — Z48816 Encounter for surgical aftercare following surgery on the genitourinary system: Secondary | ICD-10-CM | POA: Diagnosis not present

## 2016-08-29 DIAGNOSIS — M6281 Muscle weakness (generalized): Secondary | ICD-10-CM | POA: Diagnosis not present

## 2016-08-29 DIAGNOSIS — Z4789 Encounter for other orthopedic aftercare: Secondary | ICD-10-CM | POA: Diagnosis not present

## 2016-08-29 MED ORDER — IRBESARTAN 150 MG PO TABS: 150.0000 mg | ORAL_TABLET | Freq: Every day | ORAL | 2 refills | Status: DC

## 2016-08-29 NOTE — Telephone Encounter (Signed)
Patient states that he got very dizzy on Saturday night but only when he was laying down.  Pt began checking his BP Sunday and got readings of 178/92, 150/88, and then this morning it was 176/91.  Patient would like to know what to do?  Please advise.

## 2016-08-29 NOTE — Telephone Encounter (Signed)
Start bp med: I eRx'd irbesartan 150mg  to take once daily.  He can start taking this today.  Continue to monitor bp 1-2 times a day and f/u in office to review these in 2-3 weeks-thx

## 2016-08-29 NOTE — Telephone Encounter (Signed)
Spoke to patient , given instructions and patient verbalized understanding.  Patient mentioned that he was going to get new BP cuff also and make sure his BP cuff was working properly.

## 2016-08-30 DIAGNOSIS — M6281 Muscle weakness (generalized): Secondary | ICD-10-CM | POA: Diagnosis not present

## 2016-08-30 DIAGNOSIS — G609 Hereditary and idiopathic neuropathy, unspecified: Secondary | ICD-10-CM | POA: Diagnosis not present

## 2016-08-30 DIAGNOSIS — Z48816 Encounter for surgical aftercare following surgery on the genitourinary system: Secondary | ICD-10-CM | POA: Diagnosis not present

## 2016-08-30 DIAGNOSIS — Z4789 Encounter for other orthopedic aftercare: Secondary | ICD-10-CM | POA: Diagnosis not present

## 2016-08-30 DIAGNOSIS — R278 Other lack of coordination: Secondary | ICD-10-CM | POA: Diagnosis not present

## 2016-08-31 DIAGNOSIS — Z48816 Encounter for surgical aftercare following surgery on the genitourinary system: Secondary | ICD-10-CM | POA: Diagnosis not present

## 2016-08-31 DIAGNOSIS — R278 Other lack of coordination: Secondary | ICD-10-CM | POA: Diagnosis not present

## 2016-08-31 DIAGNOSIS — G609 Hereditary and idiopathic neuropathy, unspecified: Secondary | ICD-10-CM | POA: Diagnosis not present

## 2016-08-31 DIAGNOSIS — Z4789 Encounter for other orthopedic aftercare: Secondary | ICD-10-CM | POA: Diagnosis not present

## 2016-08-31 DIAGNOSIS — M6281 Muscle weakness (generalized): Secondary | ICD-10-CM | POA: Diagnosis not present

## 2016-09-01 DIAGNOSIS — Z4789 Encounter for other orthopedic aftercare: Secondary | ICD-10-CM | POA: Diagnosis not present

## 2016-09-01 DIAGNOSIS — M6281 Muscle weakness (generalized): Secondary | ICD-10-CM | POA: Diagnosis not present

## 2016-09-01 DIAGNOSIS — R278 Other lack of coordination: Secondary | ICD-10-CM | POA: Diagnosis not present

## 2016-09-01 DIAGNOSIS — Z48816 Encounter for surgical aftercare following surgery on the genitourinary system: Secondary | ICD-10-CM | POA: Diagnosis not present

## 2016-09-01 DIAGNOSIS — G609 Hereditary and idiopathic neuropathy, unspecified: Secondary | ICD-10-CM | POA: Diagnosis not present

## 2016-09-02 DIAGNOSIS — Z48816 Encounter for surgical aftercare following surgery on the genitourinary system: Secondary | ICD-10-CM | POA: Diagnosis not present

## 2016-09-02 DIAGNOSIS — Z4789 Encounter for other orthopedic aftercare: Secondary | ICD-10-CM | POA: Diagnosis not present

## 2016-09-02 DIAGNOSIS — R278 Other lack of coordination: Secondary | ICD-10-CM | POA: Diagnosis not present

## 2016-09-02 DIAGNOSIS — G609 Hereditary and idiopathic neuropathy, unspecified: Secondary | ICD-10-CM | POA: Diagnosis not present

## 2016-09-02 DIAGNOSIS — M6281 Muscle weakness (generalized): Secondary | ICD-10-CM | POA: Diagnosis not present

## 2016-09-03 ENCOUNTER — Ambulatory Visit (INDEPENDENT_AMBULATORY_CARE_PROVIDER_SITE_OTHER): Payer: Medicare Other | Admitting: Family Medicine

## 2016-09-03 ENCOUNTER — Encounter: Payer: Self-pay | Admitting: Family Medicine

## 2016-09-03 ENCOUNTER — Other Ambulatory Visit (HOSPITAL_COMMUNITY)
Admission: RE | Admit: 2016-09-03 | Discharge: 2016-09-03 | Disposition: A | Discharge status: Home / Self Care | Payer: Medicare Other | Source: Other Acute Inpatient Hospital | Attending: Family Medicine | Admitting: Family Medicine

## 2016-09-03 VITALS — BP 126/72 | HR 70 | Temp 98.1°F

## 2016-09-03 DIAGNOSIS — R350 Frequency of micturition: Secondary | ICD-10-CM

## 2016-09-03 DIAGNOSIS — H8113 Benign paroxysmal vertigo, bilateral: Secondary | ICD-10-CM

## 2016-09-03 DIAGNOSIS — R030 Elevated blood-pressure reading, without diagnosis of hypertension: Secondary | ICD-10-CM

## 2016-09-03 DIAGNOSIS — N132 Hydronephrosis with renal and ureteral calculous obstruction: Secondary | ICD-10-CM

## 2016-09-03 LAB — POC URINALSYSI DIPSTICK (AUTOMATED)
BILIRUBIN UA: NEGATIVE
GLUCOSE UA: NEGATIVE
Ketones, UA: NEGATIVE
LEUKOCYTES UA: NEGATIVE
Nitrite, UA: NEGATIVE
Protein, UA: NEGATIVE
RBC UA: NEGATIVE
Spec Grav, UA: >=1.03
Urobilinogen, UA: NEGATIVE
pH, UA: 6

## 2016-09-03 MED ORDER — MECLIZINE HCL 25 MG PO TABS: 12.5000 mg | ORAL_TABLET | Freq: Two times a day (BID) | ORAL | 0 refills | Status: DC | PRN

## 2016-09-03 NOTE — Progress Notes (Signed)
Pre visit review using our clinic review tool, if applicable. No additional management support is needed unless otherwise documented below in the visit note. 

## 2016-09-03 NOTE — Assessment & Plan Note (Signed)
Tolerated initiation of Irbesartan this week and BP well controlled in office today

## 2016-09-03 NOTE — Progress Notes (Signed)
Patient ID: Ethan Rose, male   DOB: 10-May-1946, 70 y.o.   MRN: [id number]   Subjective:    Patient ID: Ethan Rose, male    DOB: 04/30/1946, 70 y.o.   MRN: [id number]  Chief Complaint  Patient presents with  . Dizziness    x 1 week---pt reports that he turned over in the bed felt like the room was spinning---sitting up is okay....any movement turning to left or right seems to aggravate Sx....some nausea denies vomiting....denies Sx with ears...    HPI Patient is in today for evaluation of vertigo. He has been struggling with low grade vertigo symptoms for roughly a week. Only occurred when he rolled over in bed. During the day he was alright and lying still he was OK then in past 24 his symptoms have flared and he had an episode of the room spinning this am when he was up. Nausea occurred at that time. None now. No falls, syncope, vision changes or other neurologic symptoms. Denies CP/palp/SOB/HA/congestion/fevers/GI or GU c/o. Taking meds as prescribed  Past Medical History:  Diagnosis Date  . Allergic rhinitis   . Arthritis    carpal tunnel, L hand gout, arthritis - spine   . BPH with obstruction/lower urinary tract symptoms   . BPPV (benign paroxysmal positional vertigo) 03/30/2012  . Cervical myelopathy ([place])   . Chronic low back pain   . Essential hypertension   . GERD (gastroesophageal reflux disease)   . Hay fever   . Heart murmur    in childhood  . History of urinary tract infection   . Kidney calculi    First: 1997.  Has had ESWL, ureteral stent, and ureteroscopy for tx of his stones in the past.  . Neuromuscular disorder (HCC)    spondylolisthesis - lumbar region  . Olecranon bursitis 2020/11/2715   Dr. Percell Miller  . Physical deconditioning   . Prediabetes 2015   HbA1c 6.4%: pt saw nutritionist  . 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 Slow transit constipation   . 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 Vitamin D deficiency     Past Surgical  History:  Procedure Laterality Date  . ABDOMINAL EXPOSURE N/A 03/24/2015   Procedure: ABDOMINAL EXPOSURE;  Surgeon: [name], MD;  Location: [facility] NEURO [facility]S;  Service: Vascular;  Laterality: N/A;  left side approach  . ANTERIOR CERVICAL DECOMP/DISCECTOMY FUSION N/A 03/18/2016   Procedure: Cervical four-five Cervical five-six Cervical six-seven Anterior cervical decompression/diskectomy/fusion;  Surgeon: [name], MD;  Location: [place] NEURO ORS;  Service: Neurosurgery;  Laterality: N/A;  n  . ANTERIOR LAT LUMBAR FUSION Right 03/24/2015   Procedure: Right Lumbar two-Three,Lumbar Three-Four,Lumbar Four-Five Anterior lateral lumbar interbody fusion ;  Surgeon: [name], MD;  Location: [place] NEURO ORS;  Service: Neurosurgery;  Laterality: Right;  right  . COLONOSCOPY  approx 2011   Dr. [name] (normal per pt report)  . CYSTOSCOPY WITH RETROGRADE PYELOGRAM, URETEROSCOPY AND STENT PLACEMENT Left 08/15/2016   Procedure: CYSTOSCOPY WITH STENT REMOVAL,  RETROGRADES, URETEROSCOPY, STONE BASKETRY AND STENT REPLACEMENT;  Surgeon: [name], MD;  [facility];  Service: Urology;  Laterality: Left;  . CYSTOSCOPY WITH STENT PLACEMENT Left 07/16/2016   Procedure: CYSTOSCOPY, RETROGRADE WITH LEFT URETERAL STENT PLACEMENT;  Surgeon: [name], MD;  [facility];  Service: Urology;  Laterality: Left;  . HERNIA REPAIR  1993   Bilateral inguinal (urologist did these procedures)  . HOLMIUM LASER APPLICATION Left [id number]  Procedure: HOLMIUM LASER;  Surgeon: [name], MD;  [facility]: [facility];  Service: Urology;  Laterality: Left;  . LITHOTRIPSY     Multiple; most recent was spring 2015  . POSTERIOR LUMBAR FUSION 4 LEVEL N/A 03/27/2015   Procedure: Posterior pedicle subtraction osteotomy with T10 to Iliac fusion with Dr. [name] assisting;  Surgeon: [name], MD;  Location: [facility] NEURO ORS;  Service: Neurosurgery;  Laterality: N/A;  Posterior pedicle subtraction osteotomy  with T10 to Iliac fusion with Dr. [name] assisting  . TONSILLECTOMY  1954    Family History  Problem Relation Age of Onset  . Stroke Mother   . Diabetes Mother   . Heart disease Mother   . Hypertension Mother   . Heart attack Mother   . Stroke Father   . Heart disease Father   . Diabetes Brother   . Hypertension Brother     Social History   Social History  . Marital status: Widowed    Spouse name: N/A  . Number of children: N/A  . Years of education: N/A   Occupational History  . Not on file.   Social History Main Topics  . Smoking status: Former Smoker    Packs/day: 1.00    Years: 50.00    Types: Cigarettes    Quit date: 02/12/2014  . Smokeless tobacco: Never Used  . Alcohol use 4.8 oz/week    8 Cans of beer per week     Comment: 1-2 beers q day   . Drug use: No  . Sexual activity: No   Other Topics Concern  . Not on file   Social History Narrative   Widower as of 25 (wife [name] d. [place]), has one daughter and one grandson.   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   Orig from [place].  Has lived in [place] x 25 yrs.   Tobacco 50 pack-yr hx--Quit 02/2014.     Alcohol: 2 beers per day avg.  No drugs.   Exercise: none.  Has active job/physical labor.   Admitted to [facility] 03/2016   FULL CODE    Outpatient Medications Prior to Visit  Medication Sig Dispense Refill  . Cholecalciferol 5000 UNITS capsule Take 5,000 Units by mouth daily.    . irbesartan (AVAPRO) 150 MG tablet Take 1 tablet (150 mg total) by mouth daily. 30 tablet 2  . Multiple Vitamins-Minerals (MULTIVITAMIN WITH MINERALS) tablet Take 1 tablet by mouth daily.    Marland Kitchen OVER THE COUNTER MEDICATION Take 1 Dose by mouth daily. Over the counter OPC3 and Isochrome powders 1 capful of both mixed in 4 ounces of water daily    . OVER THE COUNTER MEDICATION Take 1 capsule by mouth 2 (two) times daily. Nerve renew    . OVER THE COUNTER MEDICATION Take 1 capsule by mouth daily. neuroquell    . OVER THE COUNTER  MEDICATION Take 1 tablet by mouth 2 (two) times daily. Public relations account executive    . OVER THE COUNTER MEDICATION Take 1 tablet by mouth daily. BP Platinum    . PAPAYA PO Take 3 tablets by mouth daily as needed (heartburn).     . Potassium Citrate (UROCIT-K 15) 15 MEQ (1620 MG) TBCR Take 1 tablet by mouth 2 (two) times daily.    . Probiotic Product (PROBIOTIC PO) Take 50 mg by mouth daily.    . tamsulosin (FLOMAX) 0.4 MG CAPS capsule     . Vitamin D, Ergocalciferol, (DRISDOL) 50000 units CAPS capsule Take 1 capsule (50,000  Units total) by mouth every 7 (seven) days. 4 capsule 0  . Zinc Methionate 50 MG CAPS Take 50 mg by mouth daily.     No facility-administered medications prior to visit.     No Known Allergies  Review of Systems  Constitutional: Positive for malaise/fatigue. Negative for fever.  HENT: Negative for congestion.   Eyes: Negative for blurred vision.  Respiratory: Negative for shortness of breath.   Cardiovascular: Negative for chest pain, palpitations and leg swelling.  Gastrointestinal: Positive for nausea. Negative for abdominal pain, blood in stool and vomiting.  Genitourinary: Positive for frequency. Negative for dysuria and hematuria.  Musculoskeletal: Positive for myalgias. Negative for falls.  Skin: Negative for rash.  Neurological: Positive for dizziness and weakness. Negative for loss of consciousness and headaches.  Endo/Heme/Allergies: Negative for environmental allergies.  Psychiatric/Behavioral: Negative for depression. The patient is not nervous/anxious.        Objective:    Physical Exam  Constitutional: He is oriented to person, place, and time. He appears well-developed and well-nourished. No distress.  HENT:  Head: Normocephalic and atraumatic.  Nose: Nose normal.  Eyes: Right eye exhibits no discharge. Left eye exhibits no discharge.  1 beat of nystagmus with left lateral gaze  Neck: Normal range of motion. Neck supple.  Cardiovascular: Normal rate and  regular rhythm.   No murmur heard. Pulmonary/Chest: Effort normal and breath sounds normal.  Abdominal: Soft. Bowel sounds are normal. There is no tenderness.  Musculoskeletal: He exhibits no edema.  Neurological: He is alert and oriented to person, place, and time.  Skin: Skin is warm and dry.  Psychiatric: He has a normal mood and affect.  Nursing note and vitals reviewed.   BP 126/72   Pulse 70   Temp 98.1 F (36.7 C) (Oral)   SpO2 98%  Wt Readings from Last 3 Encounters:  08/15/16 148 lb 5 oz (67.3 kg)  08/03/16 139 lb 11.2 oz (63.4 kg)  07/22/16 159 lb 12.8 oz (72.5 kg)     Lab Results  Component Value Date   WBC 8.4 08/04/2016   HGB 11.9 (L) 08/04/2016   HCT 36.6 (L) 08/04/2016   PLT 412 (H) 08/04/2016   GLUCOSE 116 (H) 08/04/2016   CHOL 183 04/16/2014   TRIG 66.0 04/16/2014   HDL 47.10 04/16/2014   LDLCALC 123 (H) 04/16/2014   ALT 30 07/23/2016   AST 24 07/23/2016   NA 138 08/04/2016   K 4.0 08/04/2016   CL 103 08/04/2016   CREATININE 0.50 (L) 08/04/2016   BUN 17 08/04/2016   CO2 25 08/04/2016   TSH 1.52 04/16/2014   PSA 0.69 04/16/2014   INR 1.27 07/16/2016   HGBA1C 6.2 (H) 07/17/2016    Lab Results  Component Value Date   TSH 1.52 04/16/2014   Lab Results  Component Value Date   WBC 8.4 08/04/2016   HGB 11.9 (L) 08/04/2016   HCT 36.6 (L) 08/04/2016   MCV 88.4 08/04/2016   PLT 412 (H) 08/04/2016   Lab Results  Component Value Date   NA 138 08/04/2016   K 4.0 08/04/2016   CO2 25 08/04/2016   GLUCOSE 116 (H) 08/04/2016   BUN 17 08/04/2016   CREATININE 0.50 (L) 08/04/2016   BILITOT 0.6 07/23/2016   ALKPHOS 94 07/23/2016   AST 24 07/23/2016   ALT 30 07/23/2016   PROT 5.8 (L) 07/23/2016   ALBUMIN 2.3 (L) 07/23/2016   CALCIUM 9.0 08/04/2016   ANIONGAP 10 08/04/2016   GFR 125.36  04/16/2014   Lab Results  Component Value Date   CHOL 183 04/16/2014   Lab Results  Component Value Date   HDL 47.10 04/16/2014   Lab Results  Component  Value Date   LDLCALC 123 (H) 04/16/2014   Lab Results  Component Value Date   TRIG 66.0 04/16/2014   Lab Results  Component Value Date   CHOLHDL 4 04/16/2014   Lab Results  Component Value Date   HGBA1C 6.2 (H) 07/17/2016       Assessment & Plan:   Problem List Items Addressed This Visit    Elevated blood pressure reading without diagnosis of hypertension    Tolerated initiation of Irbesartan this week and BP well controlled in office today      BPPV (benign paroxysmal positional vertigo)    Increase hydration, consider Dix Halpike maneuver, given Meclizine to use prn and seek care if symptoms worsen or do not improve      Ureteral stone with hydronephrosis    Urinalysis unremarkable, await urine culture results       Other Visit Diagnoses    Urinary frequency    -  Primary   Relevant Orders   Urine culture   POCT Urinalysis Dipstick (Automated) (Completed)      I am having Mr. Hu start on meclizine. I am also having him maintain his Cholecalciferol, multivitamin with minerals, Zinc Methionate, PAPAYA PO, OVER THE COUNTER MEDICATION, OVER THE COUNTER MEDICATION, Probiotic Product (PROBIOTIC PO), Vitamin D (Ergocalciferol), OVER THE COUNTER MEDICATION, OVER THE COUNTER MEDICATION, OVER THE COUNTER MEDICATION, tamsulosin, Potassium Citrate, and irbesartan.  Meds ordered this encounter  Medications  . meclizine (ANTIVERT) 25 MG tablet    Sig: Take 0.5-1 tablets (12.5-25 mg total) by mouth 2 (two) times daily as needed for dizziness.    Dispense:  20 tablet    Refill:  0     Penni Homans, MD

## 2016-09-03 NOTE — Patient Instructions (Signed)
Dix Halpike maneuver can help if your vertigo persists    Vertigo Vertigo means you feel like you or your surroundings are moving when they are not. Vertigo can be dangerous if it occurs when you are at work, driving, or performing difficult activities.  CAUSES  Vertigo occurs when there is a conflict of signals sent to your brain from the visual and sensory systems in your body. There are many different causes of vertigo, including:  Infections, especially in the inner ear.  A bad reaction to a drug or misuse of alcohol and medicines.  Withdrawal from drugs or alcohol.  Rapidly changing positions, such as lying down or rolling over in bed.  A migraine headache.  Decreased blood flow to the brain.  Increased pressure in the brain from a head injury, infection, tumor, or bleeding. SYMPTOMS  You may feel as though the world is spinning around or you are falling to the ground. Because your balance is upset, vertigo can cause nausea and vomiting. You may have involuntary eye movements (nystagmus). DIAGNOSIS  Vertigo is usually diagnosed by physical exam. If the cause of your vertigo is unknown, your caregiver may perform imaging tests, such as an MRI scan (magnetic resonance imaging). TREATMENT  Most cases of vertigo resolve on their own, without treatment. Depending on the cause, your caregiver may prescribe certain medicines. If your vertigo is related to body position issues, your caregiver may recommend movements or procedures to correct the problem. In rare cases, if your vertigo is caused by certain inner ear problems, you may need surgery. HOME CARE INSTRUCTIONS   Follow your caregiver's instructions.  Avoid driving.  Avoid operating heavy machinery.  Avoid performing any tasks that would be dangerous to you or others during a vertigo episode.  Tell your caregiver if you notice that certain medicines seem to be causing your vertigo. Some of the medicines used to treat vertigo  episodes can actually make them worse in some people. SEEK IMMEDIATE MEDICAL CARE IF:   Your medicines do not relieve your vertigo or are making it worse.  You develop problems with talking, walking, weakness, or using your arms, hands, or legs.  You develop severe headaches.  Your nausea or vomiting continues or gets worse.  You develop visual changes.  A family member notices behavioral changes.  Your condition gets worse. MAKE SURE YOU:  Understand these instructions.  Will watch your condition.  Will get help right away if you are not doing well or get worse.   This information is not intended to replace advice given to you by your health care provider. Make sure you discuss any questions you have with your health care provider.   Document Released: 08/10/2005 Document Revised: 01/23/2012 Document Reviewed: 02/23/2015 Elsevier Interactive Patient Education [employer].

## 2016-09-03 NOTE — Assessment & Plan Note (Signed)
Urinalysis unremarkable, await urine culture results

## 2016-09-03 NOTE — Assessment & Plan Note (Signed)
Increase hydration, consider Dix Halpike maneuver, given Meclizine to use prn and seek care if symptoms worsen or do not improve

## 2016-09-04 LAB — URINE CULTURE

## 2016-09-05 DIAGNOSIS — Z1211 Encounter for screening for malignant neoplasm of colon: Secondary | ICD-10-CM | POA: Diagnosis not present

## 2016-09-05 DIAGNOSIS — R278 Other lack of coordination: Secondary | ICD-10-CM | POA: Diagnosis not present

## 2016-09-05 DIAGNOSIS — G609 Hereditary and idiopathic neuropathy, unspecified: Secondary | ICD-10-CM | POA: Diagnosis not present

## 2016-09-05 DIAGNOSIS — N200 Calculus of kidney: Secondary | ICD-10-CM | POA: Diagnosis not present

## 2016-09-05 DIAGNOSIS — Z4789 Encounter for other orthopedic aftercare: Secondary | ICD-10-CM | POA: Diagnosis not present

## 2016-09-05 DIAGNOSIS — Z48816 Encounter for surgical aftercare following surgery on the genitourinary system: Secondary | ICD-10-CM | POA: Diagnosis not present

## 2016-09-05 DIAGNOSIS — Z1212 Encounter for screening for malignant neoplasm of rectum: Secondary | ICD-10-CM | POA: Diagnosis not present

## 2016-09-05 DIAGNOSIS — M6281 Muscle weakness (generalized): Secondary | ICD-10-CM | POA: Diagnosis not present

## 2016-09-06 ENCOUNTER — Telehealth: Payer: Self-pay | Admitting: Family Medicine

## 2016-09-06 NOTE — Telephone Encounter (Signed)
Patient has had low bp reading 132/83 before taking bp med this morning then 92/67 this AM & at 12:15PM 95/70. Should he decrease his bp med?

## 2016-09-06 NOTE — Telephone Encounter (Signed)
Please advise 

## 2016-09-06 NOTE — Telephone Encounter (Signed)
Yes, decrease to 1/2 tab of the irbesartan once daily.-thx

## 2016-09-07 DIAGNOSIS — Z48816 Encounter for surgical aftercare following surgery on the genitourinary system: Secondary | ICD-10-CM | POA: Diagnosis not present

## 2016-09-07 DIAGNOSIS — M6281 Muscle weakness (generalized): Secondary | ICD-10-CM | POA: Diagnosis not present

## 2016-09-07 DIAGNOSIS — G609 Hereditary and idiopathic neuropathy, unspecified: Secondary | ICD-10-CM | POA: Diagnosis not present

## 2016-09-07 DIAGNOSIS — R278 Other lack of coordination: Secondary | ICD-10-CM | POA: Diagnosis not present

## 2016-09-07 DIAGNOSIS — Z4789 Encounter for other orthopedic aftercare: Secondary | ICD-10-CM | POA: Diagnosis not present

## 2016-09-07 NOTE — Telephone Encounter (Signed)
Patient aware of medication changes, expressed understanding.

## 2016-09-08 DIAGNOSIS — M6281 Muscle weakness (generalized): Secondary | ICD-10-CM | POA: Diagnosis not present

## 2016-09-08 DIAGNOSIS — G609 Hereditary and idiopathic neuropathy, unspecified: Secondary | ICD-10-CM | POA: Diagnosis not present

## 2016-09-08 DIAGNOSIS — Z4789 Encounter for other orthopedic aftercare: Secondary | ICD-10-CM | POA: Diagnosis not present

## 2016-09-08 DIAGNOSIS — Z48816 Encounter for surgical aftercare following surgery on the genitourinary system: Secondary | ICD-10-CM | POA: Diagnosis not present

## 2016-09-08 DIAGNOSIS — R278 Other lack of coordination: Secondary | ICD-10-CM | POA: Diagnosis not present

## 2016-09-09 DIAGNOSIS — Z48816 Encounter for surgical aftercare following surgery on the genitourinary system: Secondary | ICD-10-CM | POA: Diagnosis not present

## 2016-09-09 DIAGNOSIS — R278 Other lack of coordination: Secondary | ICD-10-CM | POA: Diagnosis not present

## 2016-09-09 DIAGNOSIS — M6281 Muscle weakness (generalized): Secondary | ICD-10-CM | POA: Diagnosis not present

## 2016-09-09 DIAGNOSIS — G609 Hereditary and idiopathic neuropathy, unspecified: Secondary | ICD-10-CM | POA: Diagnosis not present

## 2016-09-09 DIAGNOSIS — Z4789 Encounter for other orthopedic aftercare: Secondary | ICD-10-CM | POA: Diagnosis not present

## 2016-09-09 LAB — COLOGUARD: Cologuard: NEGATIVE

## 2016-09-12 DIAGNOSIS — Z4789 Encounter for other orthopedic aftercare: Secondary | ICD-10-CM | POA: Diagnosis not present

## 2016-09-12 DIAGNOSIS — G609 Hereditary and idiopathic neuropathy, unspecified: Secondary | ICD-10-CM | POA: Diagnosis not present

## 2016-09-12 DIAGNOSIS — M6281 Muscle weakness (generalized): Secondary | ICD-10-CM | POA: Diagnosis not present

## 2016-09-12 DIAGNOSIS — R278 Other lack of coordination: Secondary | ICD-10-CM | POA: Diagnosis not present

## 2016-09-12 DIAGNOSIS — Z48816 Encounter for surgical aftercare following surgery on the genitourinary system: Secondary | ICD-10-CM | POA: Diagnosis not present

## 2016-09-13 DIAGNOSIS — Z48816 Encounter for surgical aftercare following surgery on the genitourinary system: Secondary | ICD-10-CM | POA: Diagnosis not present

## 2016-09-13 DIAGNOSIS — R278 Other lack of coordination: Secondary | ICD-10-CM | POA: Diagnosis not present

## 2016-09-13 DIAGNOSIS — G609 Hereditary and idiopathic neuropathy, unspecified: Secondary | ICD-10-CM | POA: Diagnosis not present

## 2016-09-13 DIAGNOSIS — Z4789 Encounter for other orthopedic aftercare: Secondary | ICD-10-CM | POA: Diagnosis not present

## 2016-09-13 DIAGNOSIS — M6281 Muscle weakness (generalized): Secondary | ICD-10-CM | POA: Diagnosis not present

## 2016-09-15 ENCOUNTER — Telehealth: Payer: Self-pay | Admitting: Family Medicine

## 2016-09-15 DIAGNOSIS — R278 Other lack of coordination: Secondary | ICD-10-CM | POA: Diagnosis not present

## 2016-09-15 DIAGNOSIS — G609 Hereditary and idiopathic neuropathy, unspecified: Secondary | ICD-10-CM | POA: Diagnosis not present

## 2016-09-15 DIAGNOSIS — Z4789 Encounter for other orthopedic aftercare: Secondary | ICD-10-CM | POA: Diagnosis not present

## 2016-09-15 DIAGNOSIS — Z48816 Encounter for surgical aftercare following surgery on the genitourinary system: Secondary | ICD-10-CM | POA: Diagnosis not present

## 2016-09-15 DIAGNOSIS — M6281 Muscle weakness (generalized): Secondary | ICD-10-CM | POA: Diagnosis not present

## 2016-09-15 NOTE — Telephone Encounter (Signed)
Pls notify pt that his cologuard test (screening for colon cancer) was NEGATIVE.  This is good!. We''ll repeat this in 3 yrs.

## 2016-09-15 NOTE — Telephone Encounter (Signed)
Patient notified and verbalized understanding. 

## 2016-09-15 NOTE — Telephone Encounter (Signed)
Left message for patient to return call.

## 2016-09-16 DIAGNOSIS — Z4789 Encounter for other orthopedic aftercare: Secondary | ICD-10-CM | POA: Diagnosis not present

## 2016-09-16 DIAGNOSIS — R278 Other lack of coordination: Secondary | ICD-10-CM | POA: Diagnosis not present

## 2016-09-16 DIAGNOSIS — Z48816 Encounter for surgical aftercare following surgery on the genitourinary system: Secondary | ICD-10-CM | POA: Diagnosis not present

## 2016-09-16 DIAGNOSIS — G609 Hereditary and idiopathic neuropathy, unspecified: Secondary | ICD-10-CM | POA: Diagnosis not present

## 2016-09-16 DIAGNOSIS — M6281 Muscle weakness (generalized): Secondary | ICD-10-CM | POA: Diagnosis not present

## 2016-09-19 DIAGNOSIS — G959 Disease of spinal cord, unspecified: Secondary | ICD-10-CM | POA: Diagnosis not present

## 2016-09-19 DIAGNOSIS — Z681 Body mass index (BMI) 19 or less, adult: Secondary | ICD-10-CM | POA: Diagnosis not present

## 2016-09-19 DIAGNOSIS — M4802 Spinal stenosis, cervical region: Secondary | ICD-10-CM | POA: Diagnosis not present

## 2016-09-19 DIAGNOSIS — R030 Elevated blood-pressure reading, without diagnosis of hypertension: Secondary | ICD-10-CM | POA: Diagnosis not present

## 2016-09-19 DIAGNOSIS — M4120 Other idiopathic scoliosis, site unspecified: Secondary | ICD-10-CM | POA: Diagnosis not present

## 2016-09-20 ENCOUNTER — Other Ambulatory Visit: Payer: Self-pay | Admitting: *Deleted

## 2016-09-20 DIAGNOSIS — Z48816 Encounter for surgical aftercare following surgery on the genitourinary system: Secondary | ICD-10-CM | POA: Diagnosis not present

## 2016-09-20 DIAGNOSIS — Z4789 Encounter for other orthopedic aftercare: Secondary | ICD-10-CM | POA: Diagnosis not present

## 2016-09-20 DIAGNOSIS — M6281 Muscle weakness (generalized): Secondary | ICD-10-CM | POA: Diagnosis not present

## 2016-09-20 DIAGNOSIS — G609 Hereditary and idiopathic neuropathy, unspecified: Secondary | ICD-10-CM | POA: Diagnosis not present

## 2016-09-20 DIAGNOSIS — R278 Other lack of coordination: Secondary | ICD-10-CM | POA: Diagnosis not present

## 2016-09-20 MED ORDER — VITAMIN D (ERGOCALCIFEROL) 1.25 MG (50000 UNIT) PO CAPS: 50000.0000 [IU] | ORAL_CAPSULE | ORAL | 0 refills | Status: DC

## 2016-09-21 ENCOUNTER — Other Ambulatory Visit: Payer: Self-pay

## 2016-09-21 DIAGNOSIS — G609 Hereditary and idiopathic neuropathy, unspecified: Secondary | ICD-10-CM | POA: Diagnosis not present

## 2016-09-21 DIAGNOSIS — Z48816 Encounter for surgical aftercare following surgery on the genitourinary system: Secondary | ICD-10-CM | POA: Diagnosis not present

## 2016-09-21 DIAGNOSIS — R278 Other lack of coordination: Secondary | ICD-10-CM | POA: Diagnosis not present

## 2016-09-21 DIAGNOSIS — Z4789 Encounter for other orthopedic aftercare: Secondary | ICD-10-CM | POA: Diagnosis not present

## 2016-09-21 DIAGNOSIS — M6281 Muscle weakness (generalized): Secondary | ICD-10-CM | POA: Diagnosis not present

## 2016-09-22 DIAGNOSIS — G609 Hereditary and idiopathic neuropathy, unspecified: Secondary | ICD-10-CM | POA: Diagnosis not present

## 2016-09-22 DIAGNOSIS — Z4789 Encounter for other orthopedic aftercare: Secondary | ICD-10-CM | POA: Diagnosis not present

## 2016-09-22 DIAGNOSIS — Z48816 Encounter for surgical aftercare following surgery on the genitourinary system: Secondary | ICD-10-CM | POA: Diagnosis not present

## 2016-09-22 DIAGNOSIS — R278 Other lack of coordination: Secondary | ICD-10-CM | POA: Diagnosis not present

## 2016-09-22 DIAGNOSIS — M6281 Muscle weakness (generalized): Secondary | ICD-10-CM | POA: Diagnosis not present

## 2016-09-23 DIAGNOSIS — Z48816 Encounter for surgical aftercare following surgery on the genitourinary system: Secondary | ICD-10-CM | POA: Diagnosis not present

## 2016-09-23 DIAGNOSIS — M6281 Muscle weakness (generalized): Secondary | ICD-10-CM | POA: Diagnosis not present

## 2016-09-23 DIAGNOSIS — R278 Other lack of coordination: Secondary | ICD-10-CM | POA: Diagnosis not present

## 2016-09-23 DIAGNOSIS — Z4789 Encounter for other orthopedic aftercare: Secondary | ICD-10-CM | POA: Diagnosis not present

## 2016-09-23 DIAGNOSIS — G609 Hereditary and idiopathic neuropathy, unspecified: Secondary | ICD-10-CM | POA: Diagnosis not present

## 2016-09-26 DIAGNOSIS — Z48816 Encounter for surgical aftercare following surgery on the genitourinary system: Secondary | ICD-10-CM | POA: Diagnosis not present

## 2016-09-26 DIAGNOSIS — R278 Other lack of coordination: Secondary | ICD-10-CM | POA: Diagnosis not present

## 2016-09-26 DIAGNOSIS — Z4789 Encounter for other orthopedic aftercare: Secondary | ICD-10-CM | POA: Diagnosis not present

## 2016-09-26 DIAGNOSIS — G609 Hereditary and idiopathic neuropathy, unspecified: Secondary | ICD-10-CM | POA: Diagnosis not present

## 2016-09-26 DIAGNOSIS — M6281 Muscle weakness (generalized): Secondary | ICD-10-CM | POA: Diagnosis not present

## 2016-09-27 DIAGNOSIS — R278 Other lack of coordination: Secondary | ICD-10-CM | POA: Diagnosis not present

## 2016-09-27 DIAGNOSIS — G609 Hereditary and idiopathic neuropathy, unspecified: Secondary | ICD-10-CM | POA: Diagnosis not present

## 2016-09-27 DIAGNOSIS — Z48816 Encounter for surgical aftercare following surgery on the genitourinary system: Secondary | ICD-10-CM | POA: Diagnosis not present

## 2016-09-27 DIAGNOSIS — Z4789 Encounter for other orthopedic aftercare: Secondary | ICD-10-CM | POA: Diagnosis not present

## 2016-09-27 DIAGNOSIS — M6281 Muscle weakness (generalized): Secondary | ICD-10-CM | POA: Diagnosis not present

## 2016-09-28 DIAGNOSIS — M6281 Muscle weakness (generalized): Secondary | ICD-10-CM | POA: Diagnosis not present

## 2016-09-28 DIAGNOSIS — Z48816 Encounter for surgical aftercare following surgery on the genitourinary system: Secondary | ICD-10-CM | POA: Diagnosis not present

## 2016-09-28 DIAGNOSIS — G609 Hereditary and idiopathic neuropathy, unspecified: Secondary | ICD-10-CM | POA: Diagnosis not present

## 2016-09-28 DIAGNOSIS — R278 Other lack of coordination: Secondary | ICD-10-CM | POA: Diagnosis not present

## 2016-09-28 DIAGNOSIS — Z4789 Encounter for other orthopedic aftercare: Secondary | ICD-10-CM | POA: Diagnosis not present

## 2016-09-29 DIAGNOSIS — Z48816 Encounter for surgical aftercare following surgery on the genitourinary system: Secondary | ICD-10-CM | POA: Diagnosis not present

## 2016-09-29 DIAGNOSIS — M6281 Muscle weakness (generalized): Secondary | ICD-10-CM | POA: Diagnosis not present

## 2016-09-29 DIAGNOSIS — G609 Hereditary and idiopathic neuropathy, unspecified: Secondary | ICD-10-CM | POA: Diagnosis not present

## 2016-09-29 DIAGNOSIS — R278 Other lack of coordination: Secondary | ICD-10-CM | POA: Diagnosis not present

## 2016-09-29 DIAGNOSIS — Z4789 Encounter for other orthopedic aftercare: Secondary | ICD-10-CM | POA: Diagnosis not present

## 2016-09-30 DIAGNOSIS — Z48816 Encounter for surgical aftercare following surgery on the genitourinary system: Secondary | ICD-10-CM | POA: Diagnosis not present

## 2016-09-30 DIAGNOSIS — M6281 Muscle weakness (generalized): Secondary | ICD-10-CM | POA: Diagnosis not present

## 2016-09-30 DIAGNOSIS — G609 Hereditary and idiopathic neuropathy, unspecified: Secondary | ICD-10-CM | POA: Diagnosis not present

## 2016-09-30 DIAGNOSIS — R278 Other lack of coordination: Secondary | ICD-10-CM | POA: Diagnosis not present

## 2016-09-30 DIAGNOSIS — Z4789 Encounter for other orthopedic aftercare: Secondary | ICD-10-CM | POA: Diagnosis not present

## 2016-10-04 DIAGNOSIS — Z4789 Encounter for other orthopedic aftercare: Secondary | ICD-10-CM | POA: Diagnosis not present

## 2016-10-04 DIAGNOSIS — M6281 Muscle weakness (generalized): Secondary | ICD-10-CM | POA: Diagnosis not present

## 2016-10-04 DIAGNOSIS — G609 Hereditary and idiopathic neuropathy, unspecified: Secondary | ICD-10-CM | POA: Diagnosis not present

## 2016-10-04 DIAGNOSIS — R278 Other lack of coordination: Secondary | ICD-10-CM | POA: Diagnosis not present

## 2016-10-04 DIAGNOSIS — Z48816 Encounter for surgical aftercare following surgery on the genitourinary system: Secondary | ICD-10-CM | POA: Diagnosis not present

## 2016-10-05 DIAGNOSIS — Z48816 Encounter for surgical aftercare following surgery on the genitourinary system: Secondary | ICD-10-CM | POA: Diagnosis not present

## 2016-10-05 DIAGNOSIS — G609 Hereditary and idiopathic neuropathy, unspecified: Secondary | ICD-10-CM | POA: Diagnosis not present

## 2016-10-05 DIAGNOSIS — Z4789 Encounter for other orthopedic aftercare: Secondary | ICD-10-CM | POA: Diagnosis not present

## 2016-10-05 DIAGNOSIS — M6281 Muscle weakness (generalized): Secondary | ICD-10-CM | POA: Diagnosis not present

## 2016-10-05 DIAGNOSIS — R278 Other lack of coordination: Secondary | ICD-10-CM | POA: Diagnosis not present

## 2016-10-07 DIAGNOSIS — G609 Hereditary and idiopathic neuropathy, unspecified: Secondary | ICD-10-CM | POA: Diagnosis not present

## 2016-10-07 DIAGNOSIS — M6281 Muscle weakness (generalized): Secondary | ICD-10-CM | POA: Diagnosis not present

## 2016-10-07 DIAGNOSIS — Z4789 Encounter for other orthopedic aftercare: Secondary | ICD-10-CM | POA: Diagnosis not present

## 2016-10-07 DIAGNOSIS — R278 Other lack of coordination: Secondary | ICD-10-CM | POA: Diagnosis not present

## 2016-10-07 DIAGNOSIS — Z48816 Encounter for surgical aftercare following surgery on the genitourinary system: Secondary | ICD-10-CM | POA: Diagnosis not present

## 2016-10-10 DIAGNOSIS — R278 Other lack of coordination: Secondary | ICD-10-CM | POA: Diagnosis not present

## 2016-10-10 DIAGNOSIS — G609 Hereditary and idiopathic neuropathy, unspecified: Secondary | ICD-10-CM | POA: Diagnosis not present

## 2016-10-10 DIAGNOSIS — Z48816 Encounter for surgical aftercare following surgery on the genitourinary system: Secondary | ICD-10-CM | POA: Diagnosis not present

## 2016-10-10 DIAGNOSIS — Z4789 Encounter for other orthopedic aftercare: Secondary | ICD-10-CM | POA: Diagnosis not present

## 2016-10-10 DIAGNOSIS — M6281 Muscle weakness (generalized): Secondary | ICD-10-CM | POA: Diagnosis not present

## 2016-10-11 DIAGNOSIS — M6281 Muscle weakness (generalized): Secondary | ICD-10-CM | POA: Diagnosis not present

## 2016-10-11 DIAGNOSIS — Z48816 Encounter for surgical aftercare following surgery on the genitourinary system: Secondary | ICD-10-CM | POA: Diagnosis not present

## 2016-10-11 DIAGNOSIS — R278 Other lack of coordination: Secondary | ICD-10-CM | POA: Diagnosis not present

## 2016-10-11 DIAGNOSIS — G609 Hereditary and idiopathic neuropathy, unspecified: Secondary | ICD-10-CM | POA: Diagnosis not present

## 2016-10-11 DIAGNOSIS — Z4789 Encounter for other orthopedic aftercare: Secondary | ICD-10-CM | POA: Diagnosis not present

## 2016-10-12 DIAGNOSIS — G609 Hereditary and idiopathic neuropathy, unspecified: Secondary | ICD-10-CM | POA: Diagnosis not present

## 2016-10-12 DIAGNOSIS — M6281 Muscle weakness (generalized): Secondary | ICD-10-CM | POA: Diagnosis not present

## 2016-10-12 DIAGNOSIS — R278 Other lack of coordination: Secondary | ICD-10-CM | POA: Diagnosis not present

## 2016-10-12 DIAGNOSIS — Z4789 Encounter for other orthopedic aftercare: Secondary | ICD-10-CM | POA: Diagnosis not present

## 2016-10-12 DIAGNOSIS — Z48816 Encounter for surgical aftercare following surgery on the genitourinary system: Secondary | ICD-10-CM | POA: Diagnosis not present

## 2016-10-13 DIAGNOSIS — R278 Other lack of coordination: Secondary | ICD-10-CM | POA: Diagnosis not present

## 2016-10-13 DIAGNOSIS — Z48816 Encounter for surgical aftercare following surgery on the genitourinary system: Secondary | ICD-10-CM | POA: Diagnosis not present

## 2016-10-13 DIAGNOSIS — G609 Hereditary and idiopathic neuropathy, unspecified: Secondary | ICD-10-CM | POA: Diagnosis not present

## 2016-10-13 DIAGNOSIS — Z4789 Encounter for other orthopedic aftercare: Secondary | ICD-10-CM | POA: Diagnosis not present

## 2016-10-13 DIAGNOSIS — M6281 Muscle weakness (generalized): Secondary | ICD-10-CM | POA: Diagnosis not present

## 2016-10-14 DIAGNOSIS — R278 Other lack of coordination: Secondary | ICD-10-CM | POA: Diagnosis not present

## 2016-10-14 DIAGNOSIS — G609 Hereditary and idiopathic neuropathy, unspecified: Secondary | ICD-10-CM | POA: Diagnosis not present

## 2016-10-14 DIAGNOSIS — M6281 Muscle weakness (generalized): Secondary | ICD-10-CM | POA: Diagnosis not present

## 2016-10-14 DIAGNOSIS — Z48816 Encounter for surgical aftercare following surgery on the genitourinary system: Secondary | ICD-10-CM | POA: Diagnosis not present

## 2016-10-14 DIAGNOSIS — Z4789 Encounter for other orthopedic aftercare: Secondary | ICD-10-CM | POA: Diagnosis not present

## 2016-10-17 DIAGNOSIS — Z48816 Encounter for surgical aftercare following surgery on the genitourinary system: Secondary | ICD-10-CM | POA: Diagnosis not present

## 2016-10-17 DIAGNOSIS — G609 Hereditary and idiopathic neuropathy, unspecified: Secondary | ICD-10-CM | POA: Diagnosis not present

## 2016-10-17 DIAGNOSIS — R278 Other lack of coordination: Secondary | ICD-10-CM | POA: Diagnosis not present

## 2016-10-17 DIAGNOSIS — Z4789 Encounter for other orthopedic aftercare: Secondary | ICD-10-CM | POA: Diagnosis not present

## 2016-10-17 DIAGNOSIS — M6281 Muscle weakness (generalized): Secondary | ICD-10-CM | POA: Diagnosis not present

## 2016-10-18 DIAGNOSIS — R278 Other lack of coordination: Secondary | ICD-10-CM | POA: Diagnosis not present

## 2016-10-18 DIAGNOSIS — M6281 Muscle weakness (generalized): Secondary | ICD-10-CM | POA: Diagnosis not present

## 2016-10-18 DIAGNOSIS — Z48816 Encounter for surgical aftercare following surgery on the genitourinary system: Secondary | ICD-10-CM | POA: Diagnosis not present

## 2016-10-18 DIAGNOSIS — Z4789 Encounter for other orthopedic aftercare: Secondary | ICD-10-CM | POA: Diagnosis not present

## 2016-10-18 DIAGNOSIS — G609 Hereditary and idiopathic neuropathy, unspecified: Secondary | ICD-10-CM | POA: Diagnosis not present

## 2016-10-19 ENCOUNTER — Other Ambulatory Visit: Payer: Self-pay | Admitting: Physical Medicine & Rehabilitation

## 2016-10-19 DIAGNOSIS — R278 Other lack of coordination: Secondary | ICD-10-CM | POA: Diagnosis not present

## 2016-10-19 DIAGNOSIS — M6281 Muscle weakness (generalized): Secondary | ICD-10-CM | POA: Diagnosis not present

## 2016-10-19 DIAGNOSIS — G609 Hereditary and idiopathic neuropathy, unspecified: Secondary | ICD-10-CM | POA: Diagnosis not present

## 2016-10-19 DIAGNOSIS — Z4789 Encounter for other orthopedic aftercare: Secondary | ICD-10-CM | POA: Diagnosis not present

## 2016-10-19 DIAGNOSIS — Z48816 Encounter for surgical aftercare following surgery on the genitourinary system: Secondary | ICD-10-CM | POA: Diagnosis not present

## 2016-10-20 DIAGNOSIS — M6281 Muscle weakness (generalized): Secondary | ICD-10-CM | POA: Diagnosis not present

## 2016-10-20 DIAGNOSIS — G609 Hereditary and idiopathic neuropathy, unspecified: Secondary | ICD-10-CM | POA: Diagnosis not present

## 2016-10-20 DIAGNOSIS — Z48816 Encounter for surgical aftercare following surgery on the genitourinary system: Secondary | ICD-10-CM | POA: Diagnosis not present

## 2016-10-20 DIAGNOSIS — R278 Other lack of coordination: Secondary | ICD-10-CM | POA: Diagnosis not present

## 2016-10-20 DIAGNOSIS — Z4789 Encounter for other orthopedic aftercare: Secondary | ICD-10-CM | POA: Diagnosis not present

## 2016-10-21 DIAGNOSIS — G609 Hereditary and idiopathic neuropathy, unspecified: Secondary | ICD-10-CM | POA: Diagnosis not present

## 2016-10-21 DIAGNOSIS — R278 Other lack of coordination: Secondary | ICD-10-CM | POA: Diagnosis not present

## 2016-10-21 DIAGNOSIS — Z4789 Encounter for other orthopedic aftercare: Secondary | ICD-10-CM | POA: Diagnosis not present

## 2016-10-21 DIAGNOSIS — N200 Calculus of kidney: Secondary | ICD-10-CM | POA: Diagnosis not present

## 2016-10-21 DIAGNOSIS — M6281 Muscle weakness (generalized): Secondary | ICD-10-CM | POA: Diagnosis not present

## 2016-10-21 DIAGNOSIS — Z48816 Encounter for surgical aftercare following surgery on the genitourinary system: Secondary | ICD-10-CM | POA: Diagnosis not present

## 2016-10-21 DIAGNOSIS — R351 Nocturia: Secondary | ICD-10-CM | POA: Diagnosis not present

## 2016-10-21 DIAGNOSIS — N401 Enlarged prostate with lower urinary tract symptoms: Secondary | ICD-10-CM | POA: Diagnosis not present

## 2016-10-24 DIAGNOSIS — G609 Hereditary and idiopathic neuropathy, unspecified: Secondary | ICD-10-CM | POA: Diagnosis not present

## 2016-10-24 DIAGNOSIS — Z48816 Encounter for surgical aftercare following surgery on the genitourinary system: Secondary | ICD-10-CM | POA: Diagnosis not present

## 2016-10-24 DIAGNOSIS — Z4789 Encounter for other orthopedic aftercare: Secondary | ICD-10-CM | POA: Diagnosis not present

## 2016-10-24 DIAGNOSIS — R278 Other lack of coordination: Secondary | ICD-10-CM | POA: Diagnosis not present

## 2016-10-24 DIAGNOSIS — M6281 Muscle weakness (generalized): Secondary | ICD-10-CM | POA: Diagnosis not present

## 2016-10-25 DIAGNOSIS — G609 Hereditary and idiopathic neuropathy, unspecified: Secondary | ICD-10-CM | POA: Diagnosis not present

## 2016-10-25 DIAGNOSIS — M6281 Muscle weakness (generalized): Secondary | ICD-10-CM | POA: Diagnosis not present

## 2016-10-25 DIAGNOSIS — Z48816 Encounter for surgical aftercare following surgery on the genitourinary system: Secondary | ICD-10-CM | POA: Diagnosis not present

## 2016-10-25 DIAGNOSIS — Z4789 Encounter for other orthopedic aftercare: Secondary | ICD-10-CM | POA: Diagnosis not present

## 2016-10-25 DIAGNOSIS — R278 Other lack of coordination: Secondary | ICD-10-CM | POA: Diagnosis not present

## 2016-10-26 ENCOUNTER — Ambulatory Visit: Payer: Medicare Other | Admitting: Family Medicine

## 2016-10-26 ENCOUNTER — Encounter: Payer: Self-pay | Admitting: Physical Medicine & Rehabilitation

## 2016-10-26 ENCOUNTER — Encounter: Payer: Medicare Other | Attending: Physical Medicine & Rehabilitation | Admitting: Physical Medicine & Rehabilitation

## 2016-10-26 ENCOUNTER — Encounter: Payer: Self-pay | Admitting: Family Medicine

## 2016-10-26 ENCOUNTER — Ambulatory Visit (INDEPENDENT_AMBULATORY_CARE_PROVIDER_SITE_OTHER): Payer: Medicare Other | Admitting: Family Medicine

## 2016-10-26 VITALS — BP 129/82 | HR 78 | Temp 97.4°F | Resp 16

## 2016-10-26 VITALS — BP 149/82 | HR 83

## 2016-10-26 DIAGNOSIS — M21371 Foot drop, right foot: Secondary | ICD-10-CM | POA: Insufficient documentation

## 2016-10-26 DIAGNOSIS — R7881 Bacteremia: Secondary | ICD-10-CM | POA: Diagnosis not present

## 2016-10-26 DIAGNOSIS — I10 Essential (primary) hypertension: Secondary | ICD-10-CM

## 2016-10-26 DIAGNOSIS — B9620 Unspecified Escherichia coli [E. coli] as the cause of diseases classified elsewhere: Secondary | ICD-10-CM | POA: Insufficient documentation

## 2016-10-26 DIAGNOSIS — Z79899 Other long term (current) drug therapy: Secondary | ICD-10-CM | POA: Diagnosis not present

## 2016-10-26 DIAGNOSIS — Z0001 Encounter for general adult medical examination with abnormal findings: Secondary | ICD-10-CM | POA: Diagnosis not present

## 2016-10-26 DIAGNOSIS — R5381 Other malaise: Secondary | ICD-10-CM | POA: Diagnosis not present

## 2016-10-26 DIAGNOSIS — N390 Urinary tract infection, site not specified: Secondary | ICD-10-CM | POA: Insufficient documentation

## 2016-10-26 DIAGNOSIS — G959 Disease of spinal cord, unspecified: Secondary | ICD-10-CM

## 2016-10-26 DIAGNOSIS — M21372 Foot drop, left foot: Secondary | ICD-10-CM | POA: Diagnosis not present

## 2016-10-26 DIAGNOSIS — M7022 Olecranon bursitis, left elbow: Secondary | ICD-10-CM | POA: Insufficient documentation

## 2016-10-26 DIAGNOSIS — Z87891 Personal history of nicotine dependence: Secondary | ICD-10-CM | POA: Insufficient documentation

## 2016-10-26 NOTE — Patient Instructions (Signed)
OUTPT THERAPY (AQUATIC THERAPY) WHEN HOME HEALTH FINISHES UP   PLEASE CALL ME WITH ANY PROBLEMS OR QUESTIONS [id number])   HAPPY HOLIDAYS!!!!                    *                * *             *   *   *         *  *   *  *  *     *  *  *  *  *  *  * *  *  *  *  *  *  *  *  *  * *               *  *               *  *               *  *

## 2016-10-26 NOTE — Progress Notes (Signed)
Subjective:    Patient ID: Ethan Rose, male    DOB: 17-Mar-1946, 70 y.o.   MRN: [id number]  HPI Ethan Rose is here in follow up of his debility and gait disorder. He is still working with HHPT on gait and LE strengthening. He is using his recumbent bike daily. His stamina is slowly improving (not fast enough for him). He can walk about 2 minutes before he has to rest. He uses his w/c to get around the house usually during the day. He is able to stand and pivot for transfer.   He hasn't had any further issues with his kidney/stones  He continues on vitamin d supp 50000 u q week.  Pain Inventory Average Pain 0 Pain Right Now 0 My pain is .  In the last 24 hours, has pain interfered with the following? General activity 0 Relation with others 0 Enjoyment of life 0 What TIME of day is your pain at its worst? . Sleep (in general) Good  Pain is worse with: . Pain improves with: . Relief from Meds: 0  Mobility use a walker ability to climb steps?  no do you drive?  no use a wheelchair transfers alone  Function not employed: date last employed 2016 I need assistance with the following:  household duties and shopping  Neuro/Psych weakness trouble walking  Prior Studies Any changes since last visit?  no  Physicians involved in your care Any changes since last visit?  no   Family History  Problem Relation Age of Onset  . Stroke Mother   . Diabetes Mother   . Heart disease Mother   . Hypertension Mother   . Heart attack Mother   . Stroke Father   . Heart disease Father   . Diabetes Brother   . Hypertension Brother    Social History   Social History  . Marital status: Widowed    Spouse name: N/A  . Number of children: N/A  . Years of education: N/A   Social History Main Topics  . Smoking status: Former Smoker    Packs/day: 1.00    Years: 50.00    Types: Cigarettes    Quit date: 02/12/2014  . Smokeless tobacco: Never Used  . Alcohol use 4.8 oz/week   8 Cans of beer per week     Comment: 1-2 beers q day   . Drug use: No  . Sexual activity: No   Other Topics Concern  . Not on file   Social History Narrative   Widower as of 64 (wife [name] d. [place]), has one daughter and one grandson.   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   Orig from [place].  Has lived in [place] x 25 yrs.   Tobacco 50 pack-yr hx--Quit 02/2014.     Alcohol: 2 beers per day avg.  No drugs.   Exercise: none.  Has active job/physical labor.   Admitted to [facility] 03/2016   FULL CODE   Past Surgical History:  Procedure Laterality Date  . ABDOMINAL EXPOSURE N/A 03/24/2015   Procedure: ABDOMINAL EXPOSURE;  Surgeon: [name], MD;  Location: [facility] NEURO [facility]S;  Service: Vascular;  Laterality: N/A;  left side approach  . ANTERIOR CERVICAL DECOMP/DISCECTOMY FUSION N/A 03/18/2016   Procedure: Cervical four-five Cervical five-six Cervical six-seven Anterior cervical decompression/diskectomy/fusion;  Surgeon: [name], MD;  Location: [place] NEURO ORS;  Service: Neurosurgery;  Laterality: N/A;  n  . ANTERIOR LAT LUMBAR FUSION Right 03/24/2015   Procedure: Right Lumbar two-Three,Lumbar Three-Four,Lumbar  Four-Five Anterior lateral lumbar interbody fusion ;  Surgeon: [name], MD;  Location: [place] NEURO ORS;  Service: Neurosurgery;  Laterality: Right;  right  . COLONOSCOPY  approx 2011   Dr. [name] (normal per pt report)  . CYSTOSCOPY WITH RETROGRADE PYELOGRAM, URETEROSCOPY AND STENT PLACEMENT Left 08/15/2016   Procedure: CYSTOSCOPY WITH STENT REMOVAL,  RETROGRADES, URETEROSCOPY, STONE BASKETRY AND STENT REPLACEMENT;  Surgeon: [name], MD;  Location: [facility] ORS;  Service: Urology;  Laterality: Left;  . CYSTOSCOPY WITH STENT PLACEMENT Left 07/16/2016   Procedure: CYSTOSCOPY, RETROGRADE WITH LEFT URETERAL STENT PLACEMENT;  Surgeon: [name], MD;  Location: [facility] ORS;  Service: Urology;  Laterality: Left;  . HERNIA REPAIR  1993   Bilateral inguinal (urologist did these  procedures)  . HOLMIUM LASER APPLICATION Left [id number]   Procedure: HOLMIUM LASER;  Surgeon: [name], MD;  Location: [facility] ORS;  Service: Urology;  Laterality: Left;  . LITHOTRIPSY     Multiple; most recent was spring 2015  . POSTERIOR LUMBAR FUSION 4 LEVEL N/A 03/27/2015   Procedure: Posterior pedicle subtraction osteotomy with T10 to Iliac fusion with Dr. [name] assisting;  Surgeon: [name], MD;  Location: [facility] NEURO ORS;  Service: Neurosurgery;  Laterality: N/A;  Posterior pedicle subtraction osteotomy with T10 to Iliac fusion with Dr. [name] assisting  . TONSILLECTOMY  1954   Past Medical History:  Diagnosis Date  . Allergic rhinitis   . Arthritis    carpal tunnel, L hand gout, arthritis - spine   . BPH with obstruction/lower urinary tract symptoms   . BPPV (benign paroxysmal positional vertigo) 03/30/2012  . Cervical myelopathy ([place])   . Chronic low back pain   . Essential hypertension   . GERD (gastroesophageal reflux disease)   . Hay fever   . Heart murmur    in childhood  . History of urinary tract infection   . Kidney calculi    First: 1997.  Has had ESWL, ureteral stent, and ureteroscopy for tx of his stones in the past.  . Neuromuscular disorder (HCC)    spondylolisthesis - lumbar region  . Olecranon bursitis 09/01/2016   Dr. Percell Miller  . Physical deconditioning   . Prediabetes 2015   HbA1c 6.4%: pt saw nutritionist  . 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 Slow transit constipation   . 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 Vitamin D deficiency    There were no vitals taken for this visit.  Opioid Risk Score:   Fall Risk Score:  `1  Depression screen PHQ 2/9  Depression screen PHQ 2/9 08/23/2016  Decreased Interest 0  Down, Depressed, Hopeless 0  PHQ - 2 Score 0   Review of Systems  HENT: Negative.   Eyes: Negative.   Respiratory: Negative.   Cardiovascular: Negative.   Gastrointestinal: Negative.   Endocrine:  Negative.   Genitourinary: Negative.   Musculoskeletal: Positive for gait problem.  Skin: Negative.   Allergic/Immunologic: Negative.   Neurological: Positive for weakness.  Hematological: Negative.   Psychiatric/Behavioral: Negative.   All other systems reviewed and are negative.      Objective:   Physical Exam  Constitutional: He appears well-developedand well-nourished.  HENT: Normocephalic. Atraumatic. Eyes: Conj and EOM intact.  Cardiovascular: RRR  Respiratory: no distress.  GI: Soft. Nontender. Genitourinary: n/a Musculoskeletal: He exhibits no edemaor deformity.  -golf ball sized fluid collection left olecranon Neurological: He is alertand oriented.  Motor 4+/5 proximal to distal in  the UE's.  B/l LE: HF 3- to 3/5, KE 3+/5, APF 4/5, ADF remains trace.  Can stand up unassisted lacks hip extension. Knee extensors and hip flexors still quite weak.  Knees buckle and hips flex with standing . Skin: Skin is warmand dry.  Psychiatric: He has a normal mood and affect. His behavior is normal. Judgmentand thought contentnormal.        Assessment & Plan:  Medical Problem List and Plan: 1. Mobility and functional deficitssecondary to debility after multiple medical complications -continue [facility] PT for balance and strength until the end of January             -advance to outpt therapy in February?  Aquatic therapy?    2. Hypocalcemia: need to recheck vitamin d and calcium levels today.  4. E coli bacteremia/UTI: Continue to monitor for temps/symptoms rather that WBC per ID. Bactrim for 10 days---completed  -stone retrieval after  urology (10/5)  5. Bilateral foot drop: appears to be related to his lumbar Scoliosis/stenosis/radiculopathy although records seem a bit inconsistent in reference to his bilateral foot drop.  He does have a history of cervical stenosis, myelopathy, and subsequent surgery as well which relates closely as  well -AFO or KAFO would be ideal but he doesn't like how they feel  -discussed realistic expectations as far as his lower extremity strength is concerned given that this LE weakness is likely neurogenic and myelopathic in nature 6. Left olecranon bursitis--- -continue pressure relief--continue elbow pad -much improved   Follow up in 3 months. Thirty minutes of face to face patient care time were spent during this visit. All questions were encouraged and answered.

## 2016-10-26 NOTE — Progress Notes (Signed)
Pre visit review using our clinic review tool, if applicable. No additional management support is needed unless otherwise documented below in the visit note. 

## 2016-10-26 NOTE — Progress Notes (Signed)
OFFICE VISIT  10/26/2016   CC:  Chief Complaint  Patient presents with  . Follow-up    HTN     HPI:    Patient is a 70 y.o. Caucasian male who presents for 2 mo f/u HTN, question of good control at last f/u visit. Was taking irbesartan 150mg  qd and his bp dropped down to 90s/60s.  We instructed him to cut back to 1/2 of a tab.  BP on this dosing was better but still low normal for the most part.  Occ stage I HTN reading, though. He did have some intermittent mild lightheaded feeling when on avapro and having low bp's. He stopped the med about 2 and 1/2 weeks ago.  Most readings since off med are below 140/90. He has cut back on salt quite a bit.   No HA, vision c/o, palpitations, CP, SOB, or LE swelling.  Still getting home rehab twice per week for bilat LE weakness due to myelopathy.  He had urosepsis a few months ago that really set him back.  Past Medical History:  Diagnosis Date  . Allergic rhinitis   . Arthritis    carpal tunnel, L hand gout, arthritis - spine   . BPH with obstruction/lower urinary tract symptoms   . BPPV (benign paroxysmal positional vertigo) 03/30/2012  . Cervical myelopathy ([place])   . Chronic low back pain   . Essential hypertension   . GERD (gastroesophageal reflux disease)   . Hay fever   . Heart murmur    in childhood  . History of urinary tract infection   . Kidney calculi    First: 1997.  Has had ESWL, ureteral stent, and ureteroscopy for tx of his stones in the past.  . Neuromuscular disorder (HCC)    spondylolisthesis - lumbar region  . Olecranon bursitis April 27, 202017   Dr. Percell Miller  . Physical deconditioning   . Prediabetes 2015   HbA1c 6.4%: pt saw nutritionist  . 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 Slow transit constipation   . 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 Vitamin D deficiency     Past Surgical History:  Procedure Laterality Date  . ABDOMINAL EXPOSURE N/A 03/24/2015   Procedure: ABDOMINAL EXPOSURE;   Surgeon: [name], MD;  Location: [facility] NEURO ORS;  Service: Vascular;  Laterality: N/A;  left side approach  . ANTERIOR CERVICAL DECOMP/DISCECTOMY FUSION N/A 03/18/2016   Procedure: Cervical four-five Cervical five-six Cervical six-seven Anterior cervical decompression/diskectomy/fusion;  Surgeon: [name], MD;  Location: [place] NEURO ORS;  Service: Neurosurgery;  Laterality: N/A;  n  . ANTERIOR LAT LUMBAR FUSION Right 03/24/2015   Procedure: Right Lumbar two-Three,Lumbar Three-Four,Lumbar Four-Five Anterior lateral lumbar interbody fusion ;  Surgeon: [name], MD;  Location: [place] NEURO ORS;  Service: Neurosurgery;  Laterality: Right;  right  . COLONOSCOPY  approx 2011   Dr. [name] (normal per pt report)  . CYSTOSCOPY WITH RETROGRADE PYELOGRAM, URETEROSCOPY AND STENT PLACEMENT Left 08/15/2016   Procedure: CYSTOSCOPY WITH STENT REMOVAL,  RETROGRADES, URETEROSCOPY, STONE BASKETRY AND STENT REPLACEMENT;  Surgeon: [name], MD;  Location: [facility];  Service: Urology;  Laterality: Left;  . CYSTOSCOPY WITH STENT PLACEMENT Left 07/16/2016   Procedure: CYSTOSCOPY, RETROGRADE WITH LEFT URETERAL STENT PLACEMENT;  Surgeon: [name], MD;  Location: [facility];  Service: Urology;  Laterality: Left;  . HERNIA REPAIR  1993   Bilateral inguinal (urologist did these procedures)  . HOLMIUM LASER APPLICATION Left [id number]  Procedure: HOLMIUM LASER;  Surgeon: [name], MD;  Location: [facility];  Service: Urology;  Laterality: Left;  . LITHOTRIPSY     Multiple; most recent was spring 2015  . POSTERIOR LUMBAR FUSION 4 LEVEL N/A 03/27/2015   Procedure: Posterior pedicle subtraction osteotomy with T10 to Iliac fusion with Dr. [name] assisting;  Surgeon: [name], MD;  [facility];  Service: Neurosurgery;  Laterality: N/A;  Posterior pedicle subtraction osteotomy with T10 to Iliac fusion with Dr. [name] assisting  . TONSILLECTOMY  1954    Outpatient Medications  Prior to Visit  Medication Sig Dispense Refill  . alfuzosin (UROXATRAL) 10 MG 24 hr tablet     . Cholecalciferol 5000 UNITS capsule Take 5,000 Units by mouth daily.    . irbesartan (AVAPRO) 150 MG tablet Take 1 tablet (150 mg total) by mouth daily. 30 tablet 2  . Multiple Vitamins-Minerals (MULTIVITAMIN WITH MINERALS) tablet Take 1 tablet by mouth daily.    Marland Kitchen OVER THE COUNTER MEDICATION Take 1 Dose by mouth daily. Over the counter OPC3 and Isochrome powders 1 capful of both mixed in 4 ounces of water daily    . OVER THE COUNTER MEDICATION Take 1 capsule by mouth 2 (two) times daily. Nerve renew    . OVER THE COUNTER MEDICATION Take 1 capsule by mouth daily. neuroquell    . OVER THE COUNTER MEDICATION Take 1 tablet by mouth 2 (two) times daily. Public relations account executive    . PAPAYA PO Take 3 tablets by mouth daily as needed (heartburn).     . Potassium Citrate (UROCIT-K 15) 15 MEQ (1620 MG) TBCR Take 1 tablet by mouth 2 (two) times daily.    . Probiotic Product (PROBIOTIC PO) Take 50 mg by mouth daily.    . Vitamin D, Ergocalciferol, (DRISDOL) 50000 units CAPS capsule Take 1 capsule (50,000 Units total) by mouth every 7 (seven) days. 4 capsule 0  . Zinc Methionate 50 MG CAPS Take 50 mg by mouth daily.    Marland Kitchen OVER THE COUNTER MEDICATION Take 1 tablet by mouth daily. BP Platinum     No facility-administered medications prior to visit.     No Known Allergies  ROS As per HPI  PE: Blood pressure 129/82, pulse 78, temperature 97.4 F (36.3 C), temperature source Oral, resp. rate 16, SpO2 98 %. Gen: Alert, well appearing.  Patient is oriented to person, place, time, and situation. AFFECT: pleasant, lucid thought and speech. No further exam today.  LABS:  Lab Results  Component Value Date   HGBA1C 6.2 (H) 07/17/2016     Chemistry      Component Value Date/Time   NA 138 08/04/2016 0709   NA 141 03/23/2016   K 4.0 08/04/2016 0709   CL 103 08/04/2016 0709   CO2 25 08/04/2016 0709   BUN 17  08/04/2016 0709   BUN 14 03/23/2016   CREATININE 0.50 (L) 08/04/2016 0709   CREATININE 0.77 03/30/2012 1537   GLU 137 03/23/2016      Component Value Date/Time   CALCIUM 9.0 08/04/2016 0709   ALKPHOS 94 07/23/2016 0534   AST 24 07/23/2016 0534   ALT 30 07/23/2016 0534   BILITOT 0.6 07/23/2016 0534      IMPRESSION AND PLAN:  HTN; he remains borderline for the most part when off meds.  Given his sensitivity to the avapro, plus his decent bp avg since being off this med, we decided to NOT start any new med at this time.  He'll continue home bp monitoring and call or return if concern of bp being persistently elevated.  An After Visit Summary was printed and given to the patient.  Spent 15 min with pt today, with >50% of this time spent in counseling and care coordination regarding the above problems.  FOLLOW UP: Return for 4-6 mo routine chronic illness f/u (30 min).  Signed:  Crissie Sickles, MD           10/26/2016

## 2016-10-27 DIAGNOSIS — Z48816 Encounter for surgical aftercare following surgery on the genitourinary system: Secondary | ICD-10-CM | POA: Diagnosis not present

## 2016-10-27 DIAGNOSIS — R278 Other lack of coordination: Secondary | ICD-10-CM | POA: Diagnosis not present

## 2016-10-27 DIAGNOSIS — M6281 Muscle weakness (generalized): Secondary | ICD-10-CM | POA: Diagnosis not present

## 2016-10-27 DIAGNOSIS — G609 Hereditary and idiopathic neuropathy, unspecified: Secondary | ICD-10-CM | POA: Diagnosis not present

## 2016-10-27 DIAGNOSIS — Z4789 Encounter for other orthopedic aftercare: Secondary | ICD-10-CM | POA: Diagnosis not present

## 2016-10-28 DIAGNOSIS — G609 Hereditary and idiopathic neuropathy, unspecified: Secondary | ICD-10-CM | POA: Diagnosis not present

## 2016-10-28 DIAGNOSIS — Z4789 Encounter for other orthopedic aftercare: Secondary | ICD-10-CM | POA: Diagnosis not present

## 2016-10-28 DIAGNOSIS — Z48816 Encounter for surgical aftercare following surgery on the genitourinary system: Secondary | ICD-10-CM | POA: Diagnosis not present

## 2016-10-28 DIAGNOSIS — M6281 Muscle weakness (generalized): Secondary | ICD-10-CM | POA: Diagnosis not present

## 2016-10-28 DIAGNOSIS — R278 Other lack of coordination: Secondary | ICD-10-CM | POA: Diagnosis not present

## 2016-10-28 NOTE — Telephone Encounter (Signed)
Ethan Rose's calcium is within normal limits. He does appear a little dry on labwork---encourage him to drink more.   Can someone follow up on his vitamin D levels when they return?  If they are within normal limits, he can go back to his daily supplement and stop the 50000 weekly capsule.

## 2016-10-31 ENCOUNTER — Telehealth: Payer: Self-pay

## 2016-10-31 DIAGNOSIS — R278 Other lack of coordination: Secondary | ICD-10-CM | POA: Diagnosis not present

## 2016-10-31 DIAGNOSIS — Z48816 Encounter for surgical aftercare following surgery on the genitourinary system: Secondary | ICD-10-CM | POA: Diagnosis not present

## 2016-10-31 DIAGNOSIS — Z4789 Encounter for other orthopedic aftercare: Secondary | ICD-10-CM | POA: Diagnosis not present

## 2016-10-31 DIAGNOSIS — G609 Hereditary and idiopathic neuropathy, unspecified: Secondary | ICD-10-CM | POA: Diagnosis not present

## 2016-10-31 DIAGNOSIS — M6281 Muscle weakness (generalized): Secondary | ICD-10-CM | POA: Diagnosis not present

## 2016-10-31 LAB — BASIC METABOLIC PANEL
BUN/Creatinine Ratio: 31 — ABNORMAL HIGH (ref 10–24)
BUN: 19 mg/dL (ref 8–27)
CALCIUM: 9.5 mg/dL (ref 8.6–10.2)
CHLORIDE: 99 mmol/L (ref 96–106)
CO2: 26 mmol/L (ref 18–29)
Creatinine, Ser: 0.62 mg/dL — ABNORMAL LOW (ref 0.76–1.27)
GFR calc Af Amer: 116 mL/min/{1.73_m2} (ref >59)
GFR, EST NON AFRICAN AMERICAN: 101 mL/min/{1.73_m2} (ref >59)
Glucose: 88 mg/dL (ref 65–99)
POTASSIUM: 4.5 mmol/L (ref 3.5–5.2)
Sodium: 141 mmol/L (ref 134–144)

## 2016-10-31 LAB — VITAMIN D 1,25 DIHYDROXY
VITAMIN D3 1, 25 (OH): 57 pg/mL
Vitamin D 1, 25 (OH)2 Total: 63 pg/mL

## 2016-10-31 NOTE — Telephone Encounter (Signed)
Received a medication refill request for drisdol, saw in chart that he needs to recheck vitamin D levels, will he be needing a refill on this med?  Vitamin D 1, 25 (OH)2 Total pg/mL 63   Comments: Reference Range:  Adults: 21 - 65   Vitamin D2 1, 25 (OH)2 pg/mL <10   Vitamin D3 1, 25 (OH)2 pg/mL 57   Resulting Agency  [facility]

## 2016-11-02 DIAGNOSIS — R278 Other lack of coordination: Secondary | ICD-10-CM | POA: Diagnosis not present

## 2016-11-02 DIAGNOSIS — M6281 Muscle weakness (generalized): Secondary | ICD-10-CM | POA: Diagnosis not present

## 2016-11-02 DIAGNOSIS — Z48816 Encounter for surgical aftercare following surgery on the genitourinary system: Secondary | ICD-10-CM | POA: Diagnosis not present

## 2016-11-02 DIAGNOSIS — Z4789 Encounter for other orthopedic aftercare: Secondary | ICD-10-CM | POA: Diagnosis not present

## 2016-11-02 DIAGNOSIS — G609 Hereditary and idiopathic neuropathy, unspecified: Secondary | ICD-10-CM | POA: Diagnosis not present

## 2016-11-03 DIAGNOSIS — R278 Other lack of coordination: Secondary | ICD-10-CM | POA: Diagnosis not present

## 2016-11-03 DIAGNOSIS — Z48816 Encounter for surgical aftercare following surgery on the genitourinary system: Secondary | ICD-10-CM | POA: Diagnosis not present

## 2016-11-03 DIAGNOSIS — M6281 Muscle weakness (generalized): Secondary | ICD-10-CM | POA: Diagnosis not present

## 2016-11-03 DIAGNOSIS — Z4789 Encounter for other orthopedic aftercare: Secondary | ICD-10-CM | POA: Diagnosis not present

## 2016-11-03 DIAGNOSIS — G609 Hereditary and idiopathic neuropathy, unspecified: Secondary | ICD-10-CM | POA: Diagnosis not present

## 2016-11-03 NOTE — Telephone Encounter (Signed)
I notified Ethan Rose giving him the instructions and told him to resume his daily vit d supplement and not the 50,000 units since his vit d level was 46 and normal 21-65

## 2016-11-04 ENCOUNTER — Telehealth: Payer: Self-pay | Admitting: Sports Medicine

## 2016-11-04 NOTE — Telephone Encounter (Signed)
Patient called earlier today stating that his left big toe is red and he has noticed bloody drainage on sock. States that he trimmed his nails a few days ago and since has been sore. Sent to pharmacy Augmentin and instructed patient to soak using Epsom salt. Patient states that [place] and may not have access to seek health care if needed. I advised patient to take antibiotics and to soak and if no better before he plans to leave to call office and I will meet him in office to perform emergent nail avulsion. Patient expressed understanding and will call if not better. -Dr. Cannon Kettle

## 2016-11-05 ENCOUNTER — Encounter (INDEPENDENT_AMBULATORY_CARE_PROVIDER_SITE_OTHER): Payer: Medicare Other | Admitting: Sports Medicine

## 2016-11-05 ENCOUNTER — Telehealth: Payer: Self-pay | Admitting: Sports Medicine

## 2016-11-05 ENCOUNTER — Encounter: Payer: Self-pay | Admitting: Sports Medicine

## 2016-11-05 DIAGNOSIS — L03032 Cellulitis of left toe: Secondary | ICD-10-CM

## 2016-11-05 DIAGNOSIS — M79675 Pain in left toe(s): Secondary | ICD-10-CM

## 2016-11-05 NOTE — Telephone Encounter (Signed)
Patient called stating that toe is not any better after soaking and taking antibiotics for 1 day. I advised the patient to meet me at office at 12 noon today for evaluation and possible nail procedure. -Dr. Cannon Kettle

## 2016-11-05 NOTE — Progress Notes (Signed)
Subjective: Ethan Rose is a 70 y.o. male patient presents to office today complaining of a painful incurvated, red, hot, swollen lateral>medial nail border of the 1st toe on the left foot. This has been present since Wednesday. States that he use to trim his own nails but no longer can due to his back. Admits a history of sepsis and states that he has a home aid who helps him stand and shower and states that he does not no know if the infection happened from bumping the toe or from wearing socks all the time. Patient has tried Augmentin and Epsom salt soaks x 1 day. Patient denies fever/chills/nausea/vomitting/any other related constitutional symptoms at this time.  Patient Active Problem List   Diagnosis Date Noted  . Hypocalcemia 10/26/2016  . Ureteral stone with hydronephrosis 08/10/2016  . Debility 07/22/2016  . Pressure ulcer 07/18/2016  . E coli bacteremia   . Leukocytosis   . Pyelonephritis 07/16/2016  . Left ureteral stone 07/16/2016  . Cervical myelopathy ([place]) 03/18/2016  . 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 Other malaise and fatigue 05/14/2014  . Health maintenance examination 04/29/2013  . Weakness of both legs 04/29/2013  . Leg cramps 04/29/2013  . Elevated blood pressure reading without diagnosis of hypertension 03/30/2012  . BPPV (benign paroxysmal positional vertigo) 03/30/2012    Current Outpatient Prescriptions on File Prior to Visit  Medication Sig Dispense Refill  . alfuzosin (UROXATRAL) 10 MG 24 hr tablet     . Cholecalciferol 5000 UNITS capsule Take 5,000 Units by mouth daily.    . irbesartan (AVAPRO) 150 MG tablet Take 1 tablet (150 mg total) by mouth daily. 30 tablet 2  . Menaquinone-7 (VITAMIN K2 PO) Take by mouth.    . Multiple Vitamins-Minerals (MULTIVITAMIN WITH MINERALS) tablet Take 1 tablet by mouth daily.    Marland Kitchen OVER THE COUNTER  MEDICATION Take 1 Dose by mouth daily. Over the counter OPC3 and Isochrome powders 1 capful of both mixed in 4 ounces of water daily    . OVER THE COUNTER MEDICATION Take 1 capsule by mouth 2 (two) times daily. Nerve renew    . OVER THE COUNTER MEDICATION Take 1 capsule by mouth daily. neuroquell    . OVER THE COUNTER MEDICATION Take 1 tablet by mouth 2 (two) times daily. Public relations account executive    . OVER THE COUNTER MEDICATION Take 1 tablet by mouth daily. BP Platinum    . PAPAYA PO Take 3 tablets by mouth daily as needed (heartburn).     . Potassium Citrate (UROCIT-K 15) 15 MEQ (1620 MG) TBCR Take 1 tablet by mouth 2 (two) times daily.    . Probiotic Product (PROBIOTIC PO) Take 50 mg by mouth daily.    . Vitamin D, Ergocalciferol, (DRISDOL) 50000 units CAPS capsule Take 1 capsule (50,000 Units total) by mouth every 7 (seven) days. 4 capsule 0  . Zinc Methionate 50 MG CAPS Take 50 mg by mouth daily.     No current facility-administered medications on file prior to visit.     No Known Allergies  Objective:  There were no vitals filed for this visit.  General: Well developed, nourished, in no acute distress, alert and oriented x3. Wheel chair assisted gait  Dermatology: Skin is warm, dry and supple bilateral. Left hallux nail appears to be  severely incurvated with hyperkeratosis formation at the distal aspects of  the lateral>medial nail border. (+)  Erythema. (+) Edema. (+) serosanguous  drainage present. The remaining nails appear unremarkable at this time. There is blanchable erythema to bilateral 2nd toes at dorsal PIPJ with no signs of infection, there are no open sores, lesions or other signs of infection present.  Vascular: Dorsalis Pedis artery 1/4 and Posterior Tibial artery pedal pulses are 1/4 bilateral with capillary fill time<5 secs. No hair growth present. +1 pitting extremity edema.   Neruologic: Grossly intact via light touch bilateral. Protective sensation intact bilateral.    Musculoskeletal: Tenderness to palpation of the Left hallux nail fold(s). Muscular strength 3/5 with limb atrophy and drop foot deformity bilateral.   Assesement and Plan: Problem List Items Addressed This Visit    None    Visit Diagnoses    Paronychia of great toe, left    -  Primary   Toe pain, left          -Discussed treatment alternatives and plan of care; Explained permanent/temporary nail avulsion and post procedure course to patient. - After a verbal consent, injected 3 ml of a 50:50 mixture of 2% plain  lidocaine and 0.5% plain marcaine in a normal hallux block fashion. Next, a  betadine prep was performed. Anesthesia was tested and found to be appropriate.  The infected Left hallux nail was then incised from the hyponychium to the epionychium and was removed and cleared from the field. The area was flushed with alcohol and dressed with antibiotic cream and a dry sterile dressing. -Patient was instructed to leave the dressing intact for today and begin soaking  in a weak solution of Epsom salt or betadine and water tomorrow. Patient was instructed to soak for 15 minutes each day  and apply neosporin and a gauze or bandaid dressing each day. -Patient was instructed to monitor the toe for signs of infection and return to office if toe becomes red, hot or swollen. -Continue with Augmentin until completed  -Advised ice, elevation, and tylenol or motrin if needed for pain.  -Patient is to return in 2 weeks for follow up care/nail check or sooner if problems arise. We will also trim the patient's nails for him if needed as well.   Landis Martins, DPM

## 2016-11-05 NOTE — Patient Instructions (Addendum)
EPSOM salt or Betadine Soak Instructions  Purchase Epsom salt or an 8 oz. bottle of BETADINE solution (Povidone)  THE DAY AFTER THE PROCEDURE  Place 1/4 cup of Epsom salt or 1 tablespoon of betadine solution in a quart of warm tap water.  Submerge your foot or feet with outer bandage intact for the initial soak; this will allow the bandage to become moist and wet for easy lift off.  Once you remove your bandage, continue to soak in the solution for 20 minutes.  This soak should be done twice a day.  Next, remove your foot or feet from solution, blot dry the affected area and cover.  You may use a band aid large enough to cover the area or use gauze and tape.  Apply other medications to the area as directed by the doctor such as cortisporin otic solution (ear drops) or neosporin.  IF YOUR SKIN BECOMES IRRITATED WHILE USING THESE INSTRUCTIONS, IT IS OKAY TO SWITCH TO  WHITE VINEGAR AND WATER.  Long Term Care Instructions-Post Nail Surgery  It is important to keep this area clean, covered, and follow the soaking instructions dispensed at the time of your surgery.  This area will eventually dry and form a scab.  Once the scab forms you no longer need to soak or apply a dressing.  If at any time you experience an increase in pain, redness, swelling, or drainage, you should contact the office as soon as possible.

## 2016-11-09 DIAGNOSIS — R278 Other lack of coordination: Secondary | ICD-10-CM | POA: Diagnosis not present

## 2016-11-09 DIAGNOSIS — M6281 Muscle weakness (generalized): Secondary | ICD-10-CM | POA: Diagnosis not present

## 2016-11-09 DIAGNOSIS — G609 Hereditary and idiopathic neuropathy, unspecified: Secondary | ICD-10-CM | POA: Diagnosis not present

## 2016-11-09 DIAGNOSIS — Z4789 Encounter for other orthopedic aftercare: Secondary | ICD-10-CM | POA: Diagnosis not present

## 2016-11-09 DIAGNOSIS — Z48816 Encounter for surgical aftercare following surgery on the genitourinary system: Secondary | ICD-10-CM | POA: Diagnosis not present

## 2016-11-09 MED ORDER — VITAMIN D (ERGOCALCIFEROL) 1.25 MG (50000 UNIT) PO CAPS: 50000.0000 [IU] | ORAL_CAPSULE | ORAL | 0 refills | Status: DC

## 2016-11-09 NOTE — Telephone Encounter (Signed)
The Vitamin D2 level is still on the low level while the total is ok. Would go ahead and continuing to supplement with his current ergocalciferol for now.

## 2016-11-10 DIAGNOSIS — Z48816 Encounter for surgical aftercare following surgery on the genitourinary system: Secondary | ICD-10-CM | POA: Diagnosis not present

## 2016-11-10 DIAGNOSIS — R278 Other lack of coordination: Secondary | ICD-10-CM | POA: Diagnosis not present

## 2016-11-10 DIAGNOSIS — Z4789 Encounter for other orthopedic aftercare: Secondary | ICD-10-CM | POA: Diagnosis not present

## 2016-11-10 DIAGNOSIS — G609 Hereditary and idiopathic neuropathy, unspecified: Secondary | ICD-10-CM | POA: Diagnosis not present

## 2016-11-10 DIAGNOSIS — M6281 Muscle weakness (generalized): Secondary | ICD-10-CM | POA: Diagnosis not present

## 2016-11-11 DIAGNOSIS — R278 Other lack of coordination: Secondary | ICD-10-CM | POA: Diagnosis not present

## 2016-11-11 DIAGNOSIS — G609 Hereditary and idiopathic neuropathy, unspecified: Secondary | ICD-10-CM | POA: Diagnosis not present

## 2016-11-11 DIAGNOSIS — Z4789 Encounter for other orthopedic aftercare: Secondary | ICD-10-CM | POA: Diagnosis not present

## 2016-11-11 DIAGNOSIS — M6281 Muscle weakness (generalized): Secondary | ICD-10-CM | POA: Diagnosis not present

## 2016-11-11 DIAGNOSIS — Z48816 Encounter for surgical aftercare following surgery on the genitourinary system: Secondary | ICD-10-CM | POA: Diagnosis not present

## 2016-11-14 DIAGNOSIS — E119 Type 2 diabetes mellitus without complications: Secondary | ICD-10-CM

## 2016-11-14 HISTORY — DX: Type 2 diabetes mellitus without complications: E11.9

## 2016-11-15 ENCOUNTER — Ambulatory Visit (INDEPENDENT_AMBULATORY_CARE_PROVIDER_SITE_OTHER): Payer: Medicare Other | Admitting: Sports Medicine

## 2016-11-15 DIAGNOSIS — M79675 Pain in left toe(s): Secondary | ICD-10-CM

## 2016-11-15 DIAGNOSIS — B351 Tinea unguium: Secondary | ICD-10-CM

## 2016-11-15 DIAGNOSIS — Z9889 Other specified postprocedural states: Secondary | ICD-10-CM

## 2016-11-15 NOTE — Progress Notes (Signed)
Subjective: Ethan Rose is a 71 y.o. male patient returns to office today for follow up evaluation after having Left hallux temp nail avulsion performed on 11-05-16. Patient has been soaking using epsom salt and applying topical antibiotic covered with bandaid daily. Reports that he has a few days of Augmentin left. Patient deniesfever/chills/nausea/vomitting/any other related constitutional symptoms at this time. Patient also desires other nails to be trimmed as well.   Patient Active Problem List   Diagnosis Date Noted  . Hypocalcemia 10/26/2016  . Ureteral stone with hydronephrosis 08/10/2016  . Debility 07/22/2016  . Pressure ulcer 07/18/2016  . E coli bacteremia   . Leukocytosis   . Pyelonephritis 07/16/2016  . Left ureteral stone 07/16/2016  . Cervical myelopathy ([place]) 03/18/2016  . 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 Other malaise and fatigue 05/14/2014  . Health maintenance examination 04/29/2013  . Weakness of both legs 04/29/2013  . Leg cramps 04/29/2013  . Elevated blood pressure reading without diagnosis of hypertension 03/30/2012  . BPPV (benign paroxysmal positional vertigo) 03/30/2012    Current Outpatient Prescriptions on File Prior to Visit  Medication Sig Dispense Refill  . alfuzosin (UROXATRAL) 10 MG 24 hr tablet     . Cholecalciferol 5000 UNITS capsule Take 5,000 Units by mouth daily.    . irbesartan (AVAPRO) 150 MG tablet Take 1 tablet (150 mg total) by mouth daily. 30 tablet 2  . Menaquinone-7 (VITAMIN K2 PO) Take by mouth.    . Multiple Vitamins-Minerals (MULTIVITAMIN WITH MINERALS) tablet Take 1 tablet by mouth daily.    Marland Kitchen OVER THE COUNTER MEDICATION Take 1 Dose by mouth daily. Over the counter OPC3 and Isochrome powders 1 capful of both mixed in 4 ounces of water daily    . OVER THE COUNTER MEDICATION Take 1 capsule by mouth 2 (two) times  daily. Nerve renew    . OVER THE COUNTER MEDICATION Take 1 capsule by mouth daily. neuroquell    . OVER THE COUNTER MEDICATION Take 1 tablet by mouth 2 (two) times daily. Public relations account executive    . OVER THE COUNTER MEDICATION Take 1 tablet by mouth daily. BP Platinum    . PAPAYA PO Take 3 tablets by mouth daily as needed (heartburn).     . Potassium Citrate (UROCIT-K 15) 15 MEQ (1620 MG) TBCR Take 1 tablet by mouth 2 (two) times daily.    . Probiotic Product (PROBIOTIC PO) Take 50 mg by mouth daily.    . Vitamin D, Ergocalciferol, (DRISDOL) 50000 units CAPS capsule Take 1 capsule (50,000 Units total) by mouth every 7 (seven) days. 4 capsule 0  . Zinc Methionate 50 MG CAPS Take 50 mg by mouth daily.     No current facility-administered medications on file prior to visit.     No Known Allergies  Objective:  General: Well developed, nourished, in no acute distress, alert and oriented x3   Dermatology: Skin is warm, dry and supple bilateral. Left hallux nail bed appears to be clean, dry, with mild granular tissue and surrounding eschar/scab. (-) Erythema. (-) Edema. (-) serosanguous drainage present. The remaining nails appear unremarkable at this time. There are no other lesions or other signs of infection  Present. All other nails are elongated and dystrophic.   Neurovascular status: Intact. Chronic disuse lower extremity swelling; No pain with calf compression bilateral.  Musculoskeletal: Decreased tenderness to palpation of the Left hallux nail bed. Muscular strength  3/5 bilateral uses wheelchair  Assesement and Plan: Problem List Items Addressed This Visit    None    Visit Diagnoses    S/P nail surgery    -  Primary   Toe pain, left       Dermatophytosis of nail          -Examined patient  -Nails x9 debrided using sterile nail nipper  -Cleansed left nail bed and gently scrubbed with peroxide and q-tip/curetted away eschar at site and applied antibiotic cream covered with bandaid.   -Discussed plan of care with patient. -Patient to continuesoaking in a weak solution of Epsom salt and warm water. Patient was instructed to soak for 15-20 minutes each day until the toe appears normal and there is no drainage, redness, tenderness, or swelling at the procedure site, and apply neosporin and a gauze or bandaid dressing each day as needed. May leave open to air at night. -Educated patient on long term care after nail surgery. -Patient was instructed to monitor the toe for reoccurrence and signs of infection; Patient advised to return to office or go to ER if toe becomes red, hot or swollen. -Patient is to return 3 months for routine nail trim or sooner if problems arise.  Landis Martins, DPM

## 2016-11-15 NOTE — Patient Instructions (Addendum)
Long Term Care Instructions-Post Nail Surgery  Once the scab forms you no longer need to soak or apply a dressing.  If at any time you experience an increase in pain, redness, swelling, or drainage, you should contact the office as soon as possible.

## 2016-11-16 DIAGNOSIS — R278 Other lack of coordination: Secondary | ICD-10-CM | POA: Diagnosis not present

## 2016-11-16 DIAGNOSIS — M6281 Muscle weakness (generalized): Secondary | ICD-10-CM | POA: Diagnosis not present

## 2016-11-16 DIAGNOSIS — G609 Hereditary and idiopathic neuropathy, unspecified: Secondary | ICD-10-CM | POA: Diagnosis not present

## 2016-11-16 DIAGNOSIS — Z4789 Encounter for other orthopedic aftercare: Secondary | ICD-10-CM | POA: Diagnosis not present

## 2016-11-16 DIAGNOSIS — Z48816 Encounter for surgical aftercare following surgery on the genitourinary system: Secondary | ICD-10-CM | POA: Diagnosis not present

## 2016-11-17 DIAGNOSIS — M6281 Muscle weakness (generalized): Secondary | ICD-10-CM | POA: Diagnosis not present

## 2016-11-17 DIAGNOSIS — G609 Hereditary and idiopathic neuropathy, unspecified: Secondary | ICD-10-CM | POA: Diagnosis not present

## 2016-11-17 DIAGNOSIS — Z48816 Encounter for surgical aftercare following surgery on the genitourinary system: Secondary | ICD-10-CM | POA: Diagnosis not present

## 2016-11-17 DIAGNOSIS — R278 Other lack of coordination: Secondary | ICD-10-CM | POA: Diagnosis not present

## 2016-11-17 DIAGNOSIS — Z4789 Encounter for other orthopedic aftercare: Secondary | ICD-10-CM | POA: Diagnosis not present

## 2016-11-18 DIAGNOSIS — G609 Hereditary and idiopathic neuropathy, unspecified: Secondary | ICD-10-CM | POA: Diagnosis not present

## 2016-11-18 DIAGNOSIS — M6281 Muscle weakness (generalized): Secondary | ICD-10-CM | POA: Diagnosis not present

## 2016-11-18 DIAGNOSIS — R278 Other lack of coordination: Secondary | ICD-10-CM | POA: Diagnosis not present

## 2016-11-18 DIAGNOSIS — Z4789 Encounter for other orthopedic aftercare: Secondary | ICD-10-CM | POA: Diagnosis not present

## 2016-11-18 DIAGNOSIS — Z48816 Encounter for surgical aftercare following surgery on the genitourinary system: Secondary | ICD-10-CM | POA: Diagnosis not present

## 2016-11-21 DIAGNOSIS — Z48816 Encounter for surgical aftercare following surgery on the genitourinary system: Secondary | ICD-10-CM | POA: Diagnosis not present

## 2016-11-21 DIAGNOSIS — R278 Other lack of coordination: Secondary | ICD-10-CM | POA: Diagnosis not present

## 2016-11-21 DIAGNOSIS — Z4789 Encounter for other orthopedic aftercare: Secondary | ICD-10-CM | POA: Diagnosis not present

## 2016-11-21 DIAGNOSIS — G609 Hereditary and idiopathic neuropathy, unspecified: Secondary | ICD-10-CM | POA: Diagnosis not present

## 2016-11-21 DIAGNOSIS — M6281 Muscle weakness (generalized): Secondary | ICD-10-CM | POA: Diagnosis not present

## 2016-11-22 ENCOUNTER — Ambulatory Visit: Payer: Medicare Other | Admitting: Sports Medicine

## 2016-11-22 DIAGNOSIS — M6281 Muscle weakness (generalized): Secondary | ICD-10-CM | POA: Diagnosis not present

## 2016-11-22 DIAGNOSIS — G609 Hereditary and idiopathic neuropathy, unspecified: Secondary | ICD-10-CM | POA: Diagnosis not present

## 2016-11-22 DIAGNOSIS — R278 Other lack of coordination: Secondary | ICD-10-CM | POA: Diagnosis not present

## 2016-11-22 DIAGNOSIS — Z4789 Encounter for other orthopedic aftercare: Secondary | ICD-10-CM | POA: Diagnosis not present

## 2016-11-22 DIAGNOSIS — Z48816 Encounter for surgical aftercare following surgery on the genitourinary system: Secondary | ICD-10-CM | POA: Diagnosis not present

## 2016-11-23 DIAGNOSIS — R278 Other lack of coordination: Secondary | ICD-10-CM | POA: Diagnosis not present

## 2016-11-23 DIAGNOSIS — G609 Hereditary and idiopathic neuropathy, unspecified: Secondary | ICD-10-CM | POA: Diagnosis not present

## 2016-11-23 DIAGNOSIS — Z48816 Encounter for surgical aftercare following surgery on the genitourinary system: Secondary | ICD-10-CM | POA: Diagnosis not present

## 2016-11-23 DIAGNOSIS — Z4789 Encounter for other orthopedic aftercare: Secondary | ICD-10-CM | POA: Diagnosis not present

## 2016-11-23 DIAGNOSIS — M6281 Muscle weakness (generalized): Secondary | ICD-10-CM | POA: Diagnosis not present

## 2016-11-24 DIAGNOSIS — Z48816 Encounter for surgical aftercare following surgery on the genitourinary system: Secondary | ICD-10-CM | POA: Diagnosis not present

## 2016-11-24 DIAGNOSIS — G609 Hereditary and idiopathic neuropathy, unspecified: Secondary | ICD-10-CM | POA: Diagnosis not present

## 2016-11-24 DIAGNOSIS — R278 Other lack of coordination: Secondary | ICD-10-CM | POA: Diagnosis not present

## 2016-11-24 DIAGNOSIS — M6281 Muscle weakness (generalized): Secondary | ICD-10-CM | POA: Diagnosis not present

## 2016-11-24 DIAGNOSIS — Z4789 Encounter for other orthopedic aftercare: Secondary | ICD-10-CM | POA: Diagnosis not present

## 2016-11-25 DIAGNOSIS — R278 Other lack of coordination: Secondary | ICD-10-CM | POA: Diagnosis not present

## 2016-11-25 DIAGNOSIS — G609 Hereditary and idiopathic neuropathy, unspecified: Secondary | ICD-10-CM | POA: Diagnosis not present

## 2016-11-25 DIAGNOSIS — M6281 Muscle weakness (generalized): Secondary | ICD-10-CM | POA: Diagnosis not present

## 2016-11-25 DIAGNOSIS — Z4789 Encounter for other orthopedic aftercare: Secondary | ICD-10-CM | POA: Diagnosis not present

## 2016-11-25 DIAGNOSIS — Z48816 Encounter for surgical aftercare following surgery on the genitourinary system: Secondary | ICD-10-CM | POA: Diagnosis not present

## 2016-11-28 DIAGNOSIS — R278 Other lack of coordination: Secondary | ICD-10-CM | POA: Diagnosis not present

## 2016-11-28 DIAGNOSIS — Z4789 Encounter for other orthopedic aftercare: Secondary | ICD-10-CM | POA: Diagnosis not present

## 2016-11-28 DIAGNOSIS — G609 Hereditary and idiopathic neuropathy, unspecified: Secondary | ICD-10-CM | POA: Diagnosis not present

## 2016-11-28 DIAGNOSIS — M6281 Muscle weakness (generalized): Secondary | ICD-10-CM | POA: Diagnosis not present

## 2016-11-28 DIAGNOSIS — Z48816 Encounter for surgical aftercare following surgery on the genitourinary system: Secondary | ICD-10-CM | POA: Diagnosis not present

## 2016-11-29 ENCOUNTER — Encounter: Payer: Medicare Other | Attending: Physical Medicine & Rehabilitation | Admitting: Physical Medicine & Rehabilitation

## 2016-11-29 ENCOUNTER — Encounter: Payer: Self-pay | Admitting: Physical Medicine & Rehabilitation

## 2016-11-29 VITALS — BP 150/86 | HR 88

## 2016-11-29 DIAGNOSIS — Z48816 Encounter for surgical aftercare following surgery on the genitourinary system: Secondary | ICD-10-CM | POA: Diagnosis not present

## 2016-11-29 DIAGNOSIS — M4316 Spondylolisthesis, lumbar region: Secondary | ICD-10-CM

## 2016-11-29 DIAGNOSIS — R7881 Bacteremia: Secondary | ICD-10-CM | POA: Insufficient documentation

## 2016-11-29 DIAGNOSIS — G609 Hereditary and idiopathic neuropathy, unspecified: Secondary | ICD-10-CM | POA: Diagnosis not present

## 2016-11-29 DIAGNOSIS — B9620 Unspecified Escherichia coli [E. coli] as the cause of diseases classified elsewhere: Secondary | ICD-10-CM | POA: Insufficient documentation

## 2016-11-29 DIAGNOSIS — M21371 Foot drop, right foot: Secondary | ICD-10-CM | POA: Diagnosis not present

## 2016-11-29 DIAGNOSIS — M5416 Radiculopathy, lumbar region: Secondary | ICD-10-CM

## 2016-11-29 DIAGNOSIS — Z0001 Encounter for general adult medical examination with abnormal findings: Secondary | ICD-10-CM | POA: Diagnosis not present

## 2016-11-29 DIAGNOSIS — Z87891 Personal history of nicotine dependence: Secondary | ICD-10-CM | POA: Diagnosis not present

## 2016-11-29 DIAGNOSIS — G959 Disease of spinal cord, unspecified: Secondary | ICD-10-CM

## 2016-11-29 DIAGNOSIS — M7022 Olecranon bursitis, left elbow: Secondary | ICD-10-CM | POA: Insufficient documentation

## 2016-11-29 DIAGNOSIS — Z79899 Other long term (current) drug therapy: Secondary | ICD-10-CM | POA: Insufficient documentation

## 2016-11-29 DIAGNOSIS — N390 Urinary tract infection, site not specified: Secondary | ICD-10-CM | POA: Insufficient documentation

## 2016-11-29 DIAGNOSIS — R278 Other lack of coordination: Secondary | ICD-10-CM | POA: Diagnosis not present

## 2016-11-29 DIAGNOSIS — Z4789 Encounter for other orthopedic aftercare: Secondary | ICD-10-CM | POA: Diagnosis not present

## 2016-11-29 DIAGNOSIS — M21372 Foot drop, left foot: Secondary | ICD-10-CM | POA: Diagnosis not present

## 2016-11-29 DIAGNOSIS — M6281 Muscle weakness (generalized): Secondary | ICD-10-CM | POA: Diagnosis not present

## 2016-11-29 NOTE — Progress Notes (Signed)
Subjective:    Patient ID: Ethan Rose, male    DOB: 30-Apr-1946, 71 y.o.   MRN: [id number]  HPI  Ethan Rose is her in follow up of his chronic gait disorder and lower extermity weakness. He has a new home health PT who has pushed him "harder." he has noticed some improvements in his strength and mobility since the change. The plan had been to move to outpt therapies soon, but the new therapist would like to see him longer.   He denies any current pain. He hasn't had any falls. He continues to use his w/c around the house. His bowel and bladder function have been normal. He denies any numbness in his legs.   Pain Inventory Average Pain 0 Pain Right Now 0 My pain is .  In the last 24 hours, has pain interfered with the following? General activity . Relation with others . Enjoyment of life . What TIME of day is your pain at its worst? . Sleep (in general) Good  Pain is worse with: . Pain improves with: . Relief from Meds: .  Mobility use a walker ability to climb steps?  no do you drive?  no use a wheelchair  Function retired I need assistance with the following:  shopping  Neuro/Psych weakness trouble walking  Prior Studies Any changes since last visit?  no  Physicians involved in your care Any changes since last visit?  no   Family History  Problem Relation Age of Onset  . Stroke Mother   . Diabetes Mother   . Heart disease Mother   . Hypertension Mother   . Heart attack Mother   . Stroke Father   . Heart disease Father   . Diabetes Brother   . Hypertension Brother    Social History   Social History  . Marital status: Widowed    Spouse name: N/A  . Number of children: N/A  . Years of education: N/A   Social History Main Topics  . Smoking status: Former Smoker    Packs/day: 1.00    Years: 50.00    Types: Cigarettes    Quit date: 02/12/2014  . Smokeless tobacco: Never Used  . Alcohol use 4.8 oz/week    8 Cans of beer per week     Comment:  1-2 beers q day   . Drug use: No  . Sexual activity: No   Other Topics Concern  . Not on file   Social History Narrative   Widower as of 16 (wife [name] d. [place]), has one daughter and one grandson.   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   Orig from [place].  Has lived in [place] x 25 yrs.   Tobacco 50 pack-yr hx--Quit 02/2014.     Alcohol: 2 beers per day avg.  No drugs.   Exercise: none.  Has active job/physical labor.   Admitted to [facility] 03/2016   FULL CODE   Past Surgical History:  Procedure Laterality Date  . ABDOMINAL EXPOSURE N/A 03/24/2015   Procedure: ABDOMINAL EXPOSURE;  Surgeon: [name], MD;  Location: [facility] NEURO ORS;  Service: Vascular;  Laterality: N/A;  left side approach  . ANTERIOR CERVICAL DECOMP/DISCECTOMY FUSION N/A 03/18/2016   Procedure: Cervical four-five Cervical five-six Cervical six-seven Anterior cervical decompression/diskectomy/fusion;  Surgeon: [name], MD;  Location: [facility] NEURO ORS;  Service: Neurosurgery;  Laterality: N/A;  n  . ANTERIOR LAT LUMBAR FUSION Right 03/24/2015   Procedure: Right Lumbar two-Three,Lumbar Three-Four,Lumbar Four-Five Anterior lateral lumbar interbody  fusion ;  Surgeon: [name], MD;  Location: [facility] NEURO ORS;  Service: Neurosurgery;  Laterality: Right;  right  . COLONOSCOPY  approx 2011   Dr. [name] (normal per pt report)  . CYSTOSCOPY WITH RETROGRADE PYELOGRAM, URETEROSCOPY AND STENT PLACEMENT Left 08/15/2016   Procedure: CYSTOSCOPY WITH STENT REMOVAL,  RETROGRADES, URETEROSCOPY, STONE BASKETRY AND STENT REPLACEMENT;  Surgeon: [name], MD;  Location: [facility] ORS;  Service: Urology;  Laterality: Left;  . CYSTOSCOPY WITH STENT PLACEMENT Left 07/16/2016   Procedure: CYSTOSCOPY, RETROGRADE WITH LEFT URETERAL STENT PLACEMENT;  Surgeon: [name], MD;  Location: [facility] ORS;  Service: Urology;  Laterality: Left;  . HERNIA REPAIR  1993   Bilateral inguinal (urologist did these procedures)  . HOLMIUM LASER APPLICATION  Left [id number]   Procedure: HOLMIUM LASER;  Surgeon: [name], MD;  Location: [facility] ORS;  Service: Urology;  Laterality: Left;  . LITHOTRIPSY     Multiple; most recent was spring 2015  . POSTERIOR LUMBAR FUSION 4 LEVEL N/A 03/27/2015   Procedure: Posterior pedicle subtraction osteotomy with T10 to Iliac fusion with Dr. [name] assisting;  Surgeon: [name], MD;  Location: [facility]URO ORS;  Service: Neurosurgery;  Laterality: N/A;  Posterior pedicle subtraction osteotomy with T10 to Iliac fusion with Dr. [name] assisting  . TONSILLECTOMY  1954   Past Medical History:  Diagnosis Date  . Allergic rhinitis   . Arthritis    carpal tunnel, L hand gout, arthritis - spine   . BPH with obstruction/lower urinary tract symptoms   . BPPV (benign paroxysmal positional vertigo) 03/30/2012  . Cervical myelopathy ([place])   . Chronic low back pain   . Essential hypertension   . GERD (gastroesophageal reflux disease)   . Hay fever   . Heart murmur    in childhood  . History of urinary tract infection   . Kidney calculi    First: 1997.  Has had ESWL, ureteral stent, and ureteroscopy for tx of his stones in the past.  . Neuromuscular disorder ([facility])    spondylolisthesis - lumbar region  . Olecranon bursitis Jan 04, 202017   Dr. Percell Miller  . Physical deconditioning   . Prediabetes 2015   HbA1c 6.4%: pt saw nutritionist  . 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 Slow transit constipation   . 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 Vitamin D deficiency    There were no vitals taken for this visit.  Opioid Risk Score:   Fall Risk Score:  `1  Depression screen PHQ 2/9  Depression screen PHQ 2/9 08/23/2016  Decreased Interest 0  Down, Depressed, Hopeless 0  PHQ - 2 Score 0   Review of Systems  HENT: Negative.   Eyes: Negative.   Respiratory: Negative.   Cardiovascular: Negative.   Gastrointestinal: Negative.   Endocrine: Negative.   Genitourinary: Negative.     Musculoskeletal: Positive for gait problem.  Skin: Negative.   Allergic/Immunologic: Negative.   Neurological: Positive for weakness.  Hematological: Negative.   Psychiatric/Behavioral: Negative.   All other systems reviewed and are negative.      Objective:   Physical Exam  Constitutional: He appears well-developedand well-nourished.  HENT: NCAT Card: RRR Respiratory: no distress.  GI: Soft. Nontender. Genitourinary: n/a Musculoskeletal: He exhibits no edemaor deformity.  -golf ball sized fluid collection left olecranon Neurological: He is alertand oriented.  Motor 4+/5 proximal to distal in the UE's.  LLE: HF 3- to 3/5, KE 3+/5, KE 1+/5,  APF 3/5, ADF remains trace to 1. RLE: HF 3, KE 3+ to 4, KF 1+/5,  APF 3/5 ADF 1/5 Can stand up unassisted lacks hip extension. Knee extensors and hip flexors still quite weak.  Knees buckle and hips flex with standing. Skin: Skin is warmand dry.  Psychiatric: He has a normal mood and affect. His behavior is normal. Judgmentand thought contentnormal.      Assessment & Plan:  Medical Problem List and Plan: 1. Mobility and functional deficitssecondary to debility, chronic radiculopathy/myelopathy  -follow up with [facility] regarding any further rec/imaging  -continue [facility] PT for balance and strength---will need ongoing therapy/HEP -advance to outpt therapy when [facility] is completed---perhaps the end of February  2. Hypocalcemia: need to recheck vitamin d and calcium levels today.    5. Bilateral foot drop: appears to be related to his lumbar Scoliosis/stenosis/radiculopathy although records are a bit inconsistent in reference to his bilateral foot drop.  He does have a history of cervical stenosis, myelopathy, and subsequent surgery as well which relates closely as well -AGAIN reviewed realistic expectations as far as his lower extremity strength is concerned given that this LE weakness is likely  neurogenic and myelopathic in nature 6. Left olecranon bursitis--- -resolved   Follow up in 3 months. Thirty minutes of face to face patient care time were spent during this visit. All questions were encouraged and answered. Greater than 50% of time during this encounter was spent counseling patient/family in regard to his weakness and etiology, prognosis.

## 2016-11-29 NOTE — Patient Instructions (Signed)
PLEASE FEEL FREE TO CALL OUR OFFICE WITH ANY PROBLEMS OR QUESTIONS [phone])    CAN CONVERT TO OUTPT PT WHEN YOU'RE READY/DONE WITH HOME THERAPIES

## 2016-12-01 DIAGNOSIS — R278 Other lack of coordination: Secondary | ICD-10-CM | POA: Diagnosis not present

## 2016-12-01 DIAGNOSIS — Z48816 Encounter for surgical aftercare following surgery on the genitourinary system: Secondary | ICD-10-CM | POA: Diagnosis not present

## 2016-12-01 DIAGNOSIS — M6281 Muscle weakness (generalized): Secondary | ICD-10-CM | POA: Diagnosis not present

## 2016-12-01 DIAGNOSIS — Z4789 Encounter for other orthopedic aftercare: Secondary | ICD-10-CM | POA: Diagnosis not present

## 2016-12-01 DIAGNOSIS — G609 Hereditary and idiopathic neuropathy, unspecified: Secondary | ICD-10-CM | POA: Diagnosis not present

## 2016-12-03 DIAGNOSIS — G609 Hereditary and idiopathic neuropathy, unspecified: Secondary | ICD-10-CM | POA: Diagnosis not present

## 2016-12-03 DIAGNOSIS — R278 Other lack of coordination: Secondary | ICD-10-CM | POA: Diagnosis not present

## 2016-12-03 DIAGNOSIS — Z4789 Encounter for other orthopedic aftercare: Secondary | ICD-10-CM | POA: Diagnosis not present

## 2016-12-03 DIAGNOSIS — Z48816 Encounter for surgical aftercare following surgery on the genitourinary system: Secondary | ICD-10-CM | POA: Diagnosis not present

## 2016-12-03 DIAGNOSIS — M6281 Muscle weakness (generalized): Secondary | ICD-10-CM | POA: Diagnosis not present

## 2016-12-05 DIAGNOSIS — R278 Other lack of coordination: Secondary | ICD-10-CM | POA: Diagnosis not present

## 2016-12-05 DIAGNOSIS — M6281 Muscle weakness (generalized): Secondary | ICD-10-CM | POA: Diagnosis not present

## 2016-12-05 DIAGNOSIS — G609 Hereditary and idiopathic neuropathy, unspecified: Secondary | ICD-10-CM | POA: Diagnosis not present

## 2016-12-05 DIAGNOSIS — Z48816 Encounter for surgical aftercare following surgery on the genitourinary system: Secondary | ICD-10-CM | POA: Diagnosis not present

## 2016-12-05 DIAGNOSIS — Z4789 Encounter for other orthopedic aftercare: Secondary | ICD-10-CM | POA: Diagnosis not present

## 2016-12-06 ENCOUNTER — Encounter: Payer: Self-pay | Admitting: Sports Medicine

## 2016-12-06 ENCOUNTER — Ambulatory Visit (INDEPENDENT_AMBULATORY_CARE_PROVIDER_SITE_OTHER): Payer: Medicare Other | Admitting: Sports Medicine

## 2016-12-06 DIAGNOSIS — L03031 Cellulitis of right toe: Secondary | ICD-10-CM

## 2016-12-06 DIAGNOSIS — M6281 Muscle weakness (generalized): Secondary | ICD-10-CM | POA: Diagnosis not present

## 2016-12-06 DIAGNOSIS — Z48816 Encounter for surgical aftercare following surgery on the genitourinary system: Secondary | ICD-10-CM | POA: Diagnosis not present

## 2016-12-06 DIAGNOSIS — M79674 Pain in right toe(s): Secondary | ICD-10-CM

## 2016-12-06 DIAGNOSIS — Z4789 Encounter for other orthopedic aftercare: Secondary | ICD-10-CM | POA: Diagnosis not present

## 2016-12-06 DIAGNOSIS — R278 Other lack of coordination: Secondary | ICD-10-CM | POA: Diagnosis not present

## 2016-12-06 DIAGNOSIS — G609 Hereditary and idiopathic neuropathy, unspecified: Secondary | ICD-10-CM | POA: Diagnosis not present

## 2016-12-06 MED ORDER — AMOXICILLIN-POT CLAVULANATE 875-125 MG PO TABS: 1.0000 | ORAL_TABLET | Freq: Two times a day (BID) | ORAL | 0 refills | Status: DC

## 2016-12-06 NOTE — Progress Notes (Signed)
Subjective: Ethan Rose is a 71 y.o. male patient presents to office today complaining of a painful incurvated, red, hot, swollen medial>lateral nail border of the 1st toe on the right foot. This has been present for a day noticed blister and drainage earlier. Not sure of what keeps causing nail infections; possible exercises with PT; Patient denies fever/chills/nausea/vomitting/any other related constitutional symptoms at this time.  Patient Active Problem List   Diagnosis Date Noted  . Lumbar radiculopathy 11/29/2016  . Hypocalcemia 10/26/2016  . Ureteral stone with hydronephrosis 08/10/2016  . Debility 07/22/2016  . Pressure ulcer 07/18/2016  . E coli bacteremia   . Leukocytosis   . Pyelonephritis 07/16/2016  . Left ureteral stone 07/16/2016  . Cervical myelopathy ([place]) 03/18/2016  . 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 Other malaise and fatigue 05/14/2014  . Health maintenance examination 04/29/2013  . Weakness of both legs 04/29/2013  . Leg cramps 04/29/2013  . Elevated blood pressure reading without diagnosis of hypertension 03/30/2012  . BPPV (benign paroxysmal positional vertigo) 03/30/2012    Current Outpatient Prescriptions on File Prior to Visit  Medication Sig Dispense Refill  . alfuzosin (UROXATRAL) 10 MG 24 hr tablet     . Cholecalciferol 5000 UNITS capsule Take 5,000 Units by mouth daily.    . irbesartan (AVAPRO) 150 MG tablet Take 1 tablet (150 mg total) by mouth daily. (Patient not taking: Reported on 11/29/2016) 30 tablet 2  . Menaquinone-7 (VITAMIN K2 PO) Take by mouth.    . Multiple Vitamins-Minerals (MULTIVITAMIN WITH MINERALS) tablet Take 1 tablet by mouth daily.    Marland Kitchen OVER THE COUNTER MEDICATION Take 1 Dose by mouth daily. Over the counter OPC3 and Isochrome powders 1 capful of both mixed in 4 ounces of water daily    . OVER THE COUNTER  MEDICATION Take 1 capsule by mouth 2 (two) times daily. Nerve renew    . OVER THE COUNTER MEDICATION Take 1 capsule by mouth daily. neuroquell    . OVER THE COUNTER MEDICATION Take 1 tablet by mouth 2 (two) times daily. Public relations account executive    . OVER THE COUNTER MEDICATION Take 1 tablet by mouth daily. BP Platinum    . PAPAYA PO Take 3 tablets by mouth daily as needed (heartburn).     . Potassium Citrate (UROCIT-K 15) 15 MEQ (1620 MG) TBCR Take 1 tablet by mouth 2 (two) times daily.    . Probiotic Product (PROBIOTIC PO) Take 50 mg by mouth daily.    . Vitamin D, Ergocalciferol, (DRISDOL) 50000 units CAPS capsule Take 1 capsule (50,000 Units total) by mouth every 7 (seven) days. (Patient not taking: Reported on 11/29/2016) 4 capsule 0  . Zinc Methionate 50 MG CAPS Take 50 mg by mouth daily.     No current facility-administered medications on file prior to visit.     No Known Allergies  Objective:  There were no vitals filed for this visit.  General: Well developed, nourished, in no acute distress, alert and oriented x3. Wheel chair assisted gait  Dermatology: Skin is warm, dry and supple bilateral.Right hallux nail appears to be severely incurvated with hyperkeratosis formation at the distal aspects of the medial>lateral nail border. (+) Erythema. (+) Edema. (+) puss with blister and serosanguous drainage present. The remaining nails appear unremarkable at this time. There is blanchable erythema to bilateral 2nd toes at dorsal PIPJ with no signs of infection, there are no  open sores, lesions or other signs of infection present.  Vascular: Dorsalis Pedis artery 1/4 and Posterior Tibial artery pedal pulses are 1/4 bilateral with capillary fill time<5 secs. No hair growth present. +1 pitting extremity edema.   Neruologic: Grossly intact via light touch bilateral. Protective sensation intact bilateral.   Musculoskeletal: Tenderness to palpation of the right hallux nail fold(s). Muscular strength 3/5 with  limb atrophy and drop foot deformity bilateral.   Assesement and Plan: Problem List Items Addressed This Visit    None    Visit Diagnoses    Paronychia of great toe of right foot    -  Primary   Relevant Medications   amoxicillin-clavulanate (AUGMENTIN) 875-125 MG tablet   Other Relevant Orders   WOUND CULTURE   Toe pain, right       Relevant Medications   amoxicillin-clavulanate (AUGMENTIN) 875-125 MG tablet   Other Relevant Orders   WOUND CULTURE     -Discussed treatment alternatives and plan of care; Explained permanent/temporary nail avulsion and post procedure course to patient. - After a verbal consent, injected 3 ml of a 50:50 mixture of 2% plain  lidocaine and 0.5% plain marcaine in a normal hallux block fashion. Next, a  betadine prep was performed. Anesthesia was tested and found to be appropriate.  The infected Right hallux nail was then incised from the hyponychium to the epionychium and was removed and cleared from the field. A wound culture was obtained and sent to microbiology. The area was flushed with alcohol and dressed with antibiotic cream and a dry sterile dressing. -Patient was instructed to leave the dressing intact for today and begin soaking  in a weak solution of Epsom salt or betadine and water tomorrow. Patient was instructed to soak for 15 minutes each day  and apply neosporin and a gauze or bandaid dressing each day. -Patient was instructed to monitor the toe for signs of infection and return to office if toe becomes red, hot or swollen. -Rx Augmentin will call patient if need to change antibiotic based on culture results -Advised ice, elevation, and tylenol or motrin if needed for pain.  -Patient is to return in 2 weeks for follow up care/nail check or sooner if problems arise.   Landis Martins, DPM

## 2016-12-06 NOTE — Patient Instructions (Signed)
EPSOM SALT OR Betadine Soak Instructions  Purchase an 8 oz. bottle of BETADINE solution (Povidone)  THE DAY AFTER THE PROCEDURE  Place 1 tablespoon of betadine solution in a quart of warm tap water.  Submerge your foot or feet with outer bandage intact for the initial soak; this will allow the bandage to become moist and wet for easy lift off.  Once you remove your bandage, continue to soak in the solution for 20 minutes.  This soak should be done twice a day.  Next, remove your foot or feet from solution, blot dry the affected area and cover.  You may use a band aid large enough to cover the area or use gauze and tape.  Apply other medications to the area as directed by the doctor such as cortisporin otic solution (ear drops) or neosporin.  IF YOUR SKIN BECOMES IRRITATED WHILE USING THESE INSTRUCTIONS, IT IS OKAY TO SWITCH TO EPSOM SALTS AND WATER OR WHITE VINEGAR AND WATER.

## 2016-12-07 DIAGNOSIS — Z4789 Encounter for other orthopedic aftercare: Secondary | ICD-10-CM | POA: Diagnosis not present

## 2016-12-07 DIAGNOSIS — G609 Hereditary and idiopathic neuropathy, unspecified: Secondary | ICD-10-CM | POA: Diagnosis not present

## 2016-12-07 DIAGNOSIS — M6281 Muscle weakness (generalized): Secondary | ICD-10-CM | POA: Diagnosis not present

## 2016-12-07 DIAGNOSIS — R278 Other lack of coordination: Secondary | ICD-10-CM | POA: Diagnosis not present

## 2016-12-07 DIAGNOSIS — Z48816 Encounter for surgical aftercare following surgery on the genitourinary system: Secondary | ICD-10-CM | POA: Diagnosis not present

## 2016-12-08 DIAGNOSIS — Z48816 Encounter for surgical aftercare following surgery on the genitourinary system: Secondary | ICD-10-CM | POA: Diagnosis not present

## 2016-12-08 DIAGNOSIS — G609 Hereditary and idiopathic neuropathy, unspecified: Secondary | ICD-10-CM | POA: Diagnosis not present

## 2016-12-08 DIAGNOSIS — M6281 Muscle weakness (generalized): Secondary | ICD-10-CM | POA: Diagnosis not present

## 2016-12-08 DIAGNOSIS — Z4789 Encounter for other orthopedic aftercare: Secondary | ICD-10-CM | POA: Diagnosis not present

## 2016-12-08 DIAGNOSIS — R278 Other lack of coordination: Secondary | ICD-10-CM | POA: Diagnosis not present

## 2016-12-09 DIAGNOSIS — Z48816 Encounter for surgical aftercare following surgery on the genitourinary system: Secondary | ICD-10-CM | POA: Diagnosis not present

## 2016-12-09 DIAGNOSIS — G609 Hereditary and idiopathic neuropathy, unspecified: Secondary | ICD-10-CM | POA: Diagnosis not present

## 2016-12-09 DIAGNOSIS — M6281 Muscle weakness (generalized): Secondary | ICD-10-CM | POA: Diagnosis not present

## 2016-12-09 DIAGNOSIS — Z4789 Encounter for other orthopedic aftercare: Secondary | ICD-10-CM | POA: Diagnosis not present

## 2016-12-09 DIAGNOSIS — R278 Other lack of coordination: Secondary | ICD-10-CM | POA: Diagnosis not present

## 2016-12-09 LAB — WOUND CULTURE: Gram Stain: NONE SEEN

## 2016-12-12 DIAGNOSIS — G609 Hereditary and idiopathic neuropathy, unspecified: Secondary | ICD-10-CM | POA: Diagnosis not present

## 2016-12-12 DIAGNOSIS — M6281 Muscle weakness (generalized): Secondary | ICD-10-CM | POA: Diagnosis not present

## 2016-12-12 DIAGNOSIS — R278 Other lack of coordination: Secondary | ICD-10-CM | POA: Diagnosis not present

## 2016-12-14 DIAGNOSIS — G609 Hereditary and idiopathic neuropathy, unspecified: Secondary | ICD-10-CM | POA: Diagnosis not present

## 2016-12-14 DIAGNOSIS — R278 Other lack of coordination: Secondary | ICD-10-CM | POA: Diagnosis not present

## 2016-12-14 DIAGNOSIS — M6281 Muscle weakness (generalized): Secondary | ICD-10-CM | POA: Diagnosis not present

## 2016-12-15 DIAGNOSIS — G609 Hereditary and idiopathic neuropathy, unspecified: Secondary | ICD-10-CM | POA: Diagnosis not present

## 2016-12-15 DIAGNOSIS — R278 Other lack of coordination: Secondary | ICD-10-CM | POA: Diagnosis not present

## 2016-12-15 DIAGNOSIS — M6281 Muscle weakness (generalized): Secondary | ICD-10-CM | POA: Diagnosis not present

## 2016-12-16 DIAGNOSIS — M6281 Muscle weakness (generalized): Secondary | ICD-10-CM | POA: Diagnosis not present

## 2016-12-16 DIAGNOSIS — G609 Hereditary and idiopathic neuropathy, unspecified: Secondary | ICD-10-CM | POA: Diagnosis not present

## 2016-12-16 DIAGNOSIS — R278 Other lack of coordination: Secondary | ICD-10-CM | POA: Diagnosis not present

## 2016-12-19 DIAGNOSIS — R278 Other lack of coordination: Secondary | ICD-10-CM | POA: Diagnosis not present

## 2016-12-19 DIAGNOSIS — M6281 Muscle weakness (generalized): Secondary | ICD-10-CM | POA: Diagnosis not present

## 2016-12-19 DIAGNOSIS — G609 Hereditary and idiopathic neuropathy, unspecified: Secondary | ICD-10-CM | POA: Diagnosis not present

## 2016-12-20 ENCOUNTER — Encounter: Payer: Self-pay | Admitting: Sports Medicine

## 2016-12-20 ENCOUNTER — Ambulatory Visit (INDEPENDENT_AMBULATORY_CARE_PROVIDER_SITE_OTHER): Payer: Self-pay | Admitting: Sports Medicine

## 2016-12-20 DIAGNOSIS — M79674 Pain in right toe(s): Secondary | ICD-10-CM

## 2016-12-20 DIAGNOSIS — L03031 Cellulitis of right toe: Secondary | ICD-10-CM

## 2016-12-20 DIAGNOSIS — R278 Other lack of coordination: Secondary | ICD-10-CM | POA: Diagnosis not present

## 2016-12-20 DIAGNOSIS — M6281 Muscle weakness (generalized): Secondary | ICD-10-CM | POA: Diagnosis not present

## 2016-12-20 DIAGNOSIS — G609 Hereditary and idiopathic neuropathy, unspecified: Secondary | ICD-10-CM | POA: Diagnosis not present

## 2016-12-20 DIAGNOSIS — Z9889 Other specified postprocedural states: Secondary | ICD-10-CM

## 2016-12-20 NOTE — Progress Notes (Signed)
Subjective: FLINT ETTER is a 71 y.o. male patient returns to office today for follow up evaluation after having Right hallux temp nail avulsion performed on 12-06-16. Patient has been soaking using epsom salt and applying topical antibiotic covered with bandaid daily. Reports that he has a few days of Augmentin left. Patient deniesfever/chills/nausea/vomitting/any other related constitutional symptoms at this time.  Patient Active Problem List   Diagnosis Date Noted  . Lumbar radiculopathy 11/29/2016  . Hypocalcemia 10/26/2016  . Ureteral stone with hydronephrosis 08/10/2016  . Debility 07/22/2016  . Pressure ulcer 07/18/2016  . E coli bacteremia   . Leukocytosis   . Pyelonephritis 07/16/2016  . Left ureteral stone 07/16/2016  . Cervical myelopathy ([place]) 03/18/2016  . 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 Other malaise and fatigue 05/14/2014  . Health maintenance examination 04/29/2013  . Weakness of both legs 04/29/2013  . Leg cramps 04/29/2013  . Elevated blood pressure reading without diagnosis of hypertension 03/30/2012  . BPPV (benign paroxysmal positional vertigo) 03/30/2012    Current Outpatient Prescriptions on File Prior to Visit  Medication Sig Dispense Refill  . alfuzosin (UROXATRAL) 10 MG 24 hr tablet     . amoxicillin-clavulanate (AUGMENTIN) 875-125 MG tablet Take 1 tablet by mouth 2 (two) times daily. 28 tablet 0  . Cholecalciferol 5000 UNITS capsule Take 5,000 Units by mouth daily.    . irbesartan (AVAPRO) 150 MG tablet Take 1 tablet (150 mg total) by mouth daily. (Patient not taking: Reported on 11/29/2016) 30 tablet 2  . Menaquinone-7 (VITAMIN K2 PO) Take by mouth.    . Multiple Vitamins-Minerals (MULTIVITAMIN WITH MINERALS) tablet Take 1 tablet by mouth daily.    Marland Kitchen OVER THE COUNTER MEDICATION Take 1 Dose by mouth daily. Over the counter OPC3 and  Isochrome powders 1 capful of both mixed in 4 ounces of water daily    . OVER THE COUNTER MEDICATION Take 1 capsule by mouth 2 (two) times daily. Nerve renew    . OVER THE COUNTER MEDICATION Take 1 capsule by mouth daily. neuroquell    . OVER THE COUNTER MEDICATION Take 1 tablet by mouth 2 (two) times daily. Public relations account executive    . OVER THE COUNTER MEDICATION Take 1 tablet by mouth daily. BP Platinum    . PAPAYA PO Take 3 tablets by mouth daily as needed (heartburn).     . Potassium Citrate (UROCIT-K 15) 15 MEQ (1620 MG) TBCR Take 1 tablet by mouth 2 (two) times daily.    . Probiotic Product (PROBIOTIC PO) Take 50 mg by mouth daily.    . Vitamin D, Ergocalciferol, (DRISDOL) 50000 units CAPS capsule Take 1 capsule (50,000 Units total) by mouth every 7 (seven) days. (Patient not taking: Reported on 11/29/2016) 4 capsule 0  . Zinc Methionate 50 MG CAPS Take 50 mg by mouth daily.     No current facility-administered medications on file prior to visit.     No Known Allergies  Objective:  General: Well developed, nourished, in no acute distress, alert and oriented x3   Dermatology: Skin is warm, dry and supple bilateral. Right hallux nail bed appears to be clean, dry, with mild granular tissue and surrounding eschar/scab. (-) Erythema. (-) Edema. (-) serosanguous drainage present. The remaining nails appear unremarkable at this time. Left nail bed is slow healing. There are no other lesions or other signs of infection  Present. All other nails are mildly elongated  and dystrophic.   Neurovascular status: Intact. Chronic disuse lower extremity swelling; No pain with calf compression bilateral.  Musculoskeletal: Decreased tenderness to palpation of the Right hallux nail bed. Muscular strength 3/5 bilateral uses wheelchair  Assesement and Plan: Problem List Items Addressed This Visit    None    Visit Diagnoses    Paronychia of great toe of right foot    -  Primary   Toe pain, right       S/P nail  surgery          -Examined patient  -Cleansed Right nail bed and gently scrubbed with peroxide and q-tip/curetted away eschar at site and applied antibiotic cream covered with bandaid.  -Discussed plan of care with patient. -Patient to continuesoaking in a weak solution of Epsom salt and warm water. Patient was instructed to soak for 15-20 minutes each day until the toe appears normal and there is no drainage, redness, tenderness, or swelling at the procedure site, and apply neosporin and a gauze or bandaid dressing each day as needed. May leave open to air at night. -Educated patient on long term care after nail surgery. -Patient was instructed to monitor the toe for reoccurrence and signs of infection; Patient advised to return to office or go to ER if toe becomes red, hot or swollen. -Patient is to return as needed or sooner if problems arise.  Landis Martins, DPM

## 2016-12-22 DIAGNOSIS — R278 Other lack of coordination: Secondary | ICD-10-CM | POA: Diagnosis not present

## 2016-12-22 DIAGNOSIS — G609 Hereditary and idiopathic neuropathy, unspecified: Secondary | ICD-10-CM | POA: Diagnosis not present

## 2016-12-22 DIAGNOSIS — M6281 Muscle weakness (generalized): Secondary | ICD-10-CM | POA: Diagnosis not present

## 2016-12-23 DIAGNOSIS — M6281 Muscle weakness (generalized): Secondary | ICD-10-CM | POA: Diagnosis not present

## 2016-12-23 DIAGNOSIS — R278 Other lack of coordination: Secondary | ICD-10-CM | POA: Diagnosis not present

## 2016-12-23 DIAGNOSIS — G609 Hereditary and idiopathic neuropathy, unspecified: Secondary | ICD-10-CM | POA: Diagnosis not present

## 2016-12-26 DIAGNOSIS — G609 Hereditary and idiopathic neuropathy, unspecified: Secondary | ICD-10-CM | POA: Diagnosis not present

## 2016-12-26 DIAGNOSIS — M6281 Muscle weakness (generalized): Secondary | ICD-10-CM | POA: Diagnosis not present

## 2016-12-26 DIAGNOSIS — R278 Other lack of coordination: Secondary | ICD-10-CM | POA: Diagnosis not present

## 2016-12-27 DIAGNOSIS — R278 Other lack of coordination: Secondary | ICD-10-CM | POA: Diagnosis not present

## 2016-12-27 DIAGNOSIS — M5416 Radiculopathy, lumbar region: Secondary | ICD-10-CM | POA: Diagnosis not present

## 2016-12-27 DIAGNOSIS — G959 Disease of spinal cord, unspecified: Secondary | ICD-10-CM | POA: Diagnosis not present

## 2016-12-27 DIAGNOSIS — M4802 Spinal stenosis, cervical region: Secondary | ICD-10-CM | POA: Diagnosis not present

## 2016-12-27 DIAGNOSIS — M4317 Spondylolisthesis, lumbosacral region: Secondary | ICD-10-CM | POA: Diagnosis not present

## 2016-12-27 DIAGNOSIS — M6281 Muscle weakness (generalized): Secondary | ICD-10-CM | POA: Diagnosis not present

## 2016-12-27 DIAGNOSIS — G609 Hereditary and idiopathic neuropathy, unspecified: Secondary | ICD-10-CM | POA: Diagnosis not present

## 2016-12-27 DIAGNOSIS — M4120 Other idiopathic scoliosis, site unspecified: Secondary | ICD-10-CM | POA: Diagnosis not present

## 2016-12-29 DIAGNOSIS — M6281 Muscle weakness (generalized): Secondary | ICD-10-CM | POA: Diagnosis not present

## 2016-12-29 DIAGNOSIS — G609 Hereditary and idiopathic neuropathy, unspecified: Secondary | ICD-10-CM | POA: Diagnosis not present

## 2016-12-29 DIAGNOSIS — R278 Other lack of coordination: Secondary | ICD-10-CM | POA: Diagnosis not present

## 2016-12-30 DIAGNOSIS — M6281 Muscle weakness (generalized): Secondary | ICD-10-CM | POA: Diagnosis not present

## 2016-12-30 DIAGNOSIS — G609 Hereditary and idiopathic neuropathy, unspecified: Secondary | ICD-10-CM | POA: Diagnosis not present

## 2016-12-30 DIAGNOSIS — R278 Other lack of coordination: Secondary | ICD-10-CM | POA: Diagnosis not present

## 2017-01-02 DIAGNOSIS — R278 Other lack of coordination: Secondary | ICD-10-CM | POA: Diagnosis not present

## 2017-01-02 DIAGNOSIS — G609 Hereditary and idiopathic neuropathy, unspecified: Secondary | ICD-10-CM | POA: Diagnosis not present

## 2017-01-02 DIAGNOSIS — M6281 Muscle weakness (generalized): Secondary | ICD-10-CM | POA: Diagnosis not present

## 2017-01-03 DIAGNOSIS — G609 Hereditary and idiopathic neuropathy, unspecified: Secondary | ICD-10-CM | POA: Diagnosis not present

## 2017-01-03 DIAGNOSIS — M6281 Muscle weakness (generalized): Secondary | ICD-10-CM | POA: Diagnosis not present

## 2017-01-03 DIAGNOSIS — R278 Other lack of coordination: Secondary | ICD-10-CM | POA: Diagnosis not present

## 2017-01-04 DIAGNOSIS — M6281 Muscle weakness (generalized): Secondary | ICD-10-CM | POA: Diagnosis not present

## 2017-01-04 DIAGNOSIS — G609 Hereditary and idiopathic neuropathy, unspecified: Secondary | ICD-10-CM | POA: Diagnosis not present

## 2017-01-04 DIAGNOSIS — R278 Other lack of coordination: Secondary | ICD-10-CM | POA: Diagnosis not present

## 2017-01-05 DIAGNOSIS — M6281 Muscle weakness (generalized): Secondary | ICD-10-CM | POA: Diagnosis not present

## 2017-01-05 DIAGNOSIS — G609 Hereditary and idiopathic neuropathy, unspecified: Secondary | ICD-10-CM | POA: Diagnosis not present

## 2017-01-05 DIAGNOSIS — R278 Other lack of coordination: Secondary | ICD-10-CM | POA: Diagnosis not present

## 2017-01-06 DIAGNOSIS — G609 Hereditary and idiopathic neuropathy, unspecified: Secondary | ICD-10-CM | POA: Diagnosis not present

## 2017-01-06 DIAGNOSIS — M6281 Muscle weakness (generalized): Secondary | ICD-10-CM | POA: Diagnosis not present

## 2017-01-06 DIAGNOSIS — R278 Other lack of coordination: Secondary | ICD-10-CM | POA: Diagnosis not present

## 2017-01-09 DIAGNOSIS — M6281 Muscle weakness (generalized): Secondary | ICD-10-CM | POA: Diagnosis not present

## 2017-01-09 DIAGNOSIS — G609 Hereditary and idiopathic neuropathy, unspecified: Secondary | ICD-10-CM | POA: Diagnosis not present

## 2017-01-09 DIAGNOSIS — R278 Other lack of coordination: Secondary | ICD-10-CM | POA: Diagnosis not present

## 2017-01-10 DIAGNOSIS — M6281 Muscle weakness (generalized): Secondary | ICD-10-CM | POA: Diagnosis not present

## 2017-01-10 DIAGNOSIS — G609 Hereditary and idiopathic neuropathy, unspecified: Secondary | ICD-10-CM | POA: Diagnosis not present

## 2017-01-10 DIAGNOSIS — R278 Other lack of coordination: Secondary | ICD-10-CM | POA: Diagnosis not present

## 2017-01-11 DIAGNOSIS — M6281 Muscle weakness (generalized): Secondary | ICD-10-CM | POA: Diagnosis not present

## 2017-01-11 DIAGNOSIS — R278 Other lack of coordination: Secondary | ICD-10-CM | POA: Diagnosis not present

## 2017-01-11 DIAGNOSIS — G609 Hereditary and idiopathic neuropathy, unspecified: Secondary | ICD-10-CM | POA: Diagnosis not present

## 2017-01-12 DIAGNOSIS — M4802 Spinal stenosis, cervical region: Secondary | ICD-10-CM | POA: Diagnosis not present

## 2017-01-12 DIAGNOSIS — G609 Hereditary and idiopathic neuropathy, unspecified: Secondary | ICD-10-CM | POA: Diagnosis not present

## 2017-01-12 DIAGNOSIS — M4120 Other idiopathic scoliosis, site unspecified: Secondary | ICD-10-CM | POA: Diagnosis not present

## 2017-01-12 DIAGNOSIS — R278 Other lack of coordination: Secondary | ICD-10-CM | POA: Diagnosis not present

## 2017-01-12 DIAGNOSIS — M6281 Muscle weakness (generalized): Secondary | ICD-10-CM | POA: Diagnosis not present

## 2017-01-12 DIAGNOSIS — M50221 Other cervical disc displacement at C4-C5 level: Secondary | ICD-10-CM | POA: Diagnosis not present

## 2017-01-12 DIAGNOSIS — M5124 Other intervertebral disc displacement, thoracic region: Secondary | ICD-10-CM | POA: Diagnosis not present

## 2017-01-13 DIAGNOSIS — G609 Hereditary and idiopathic neuropathy, unspecified: Secondary | ICD-10-CM | POA: Diagnosis not present

## 2017-01-13 DIAGNOSIS — R278 Other lack of coordination: Secondary | ICD-10-CM | POA: Diagnosis not present

## 2017-01-13 DIAGNOSIS — M6281 Muscle weakness (generalized): Secondary | ICD-10-CM | POA: Diagnosis not present

## 2017-01-15 DIAGNOSIS — M6281 Muscle weakness (generalized): Secondary | ICD-10-CM | POA: Diagnosis not present

## 2017-01-15 DIAGNOSIS — G609 Hereditary and idiopathic neuropathy, unspecified: Secondary | ICD-10-CM | POA: Diagnosis not present

## 2017-01-15 DIAGNOSIS — R278 Other lack of coordination: Secondary | ICD-10-CM | POA: Diagnosis not present

## 2017-01-16 DIAGNOSIS — R278 Other lack of coordination: Secondary | ICD-10-CM | POA: Diagnosis not present

## 2017-01-16 DIAGNOSIS — G609 Hereditary and idiopathic neuropathy, unspecified: Secondary | ICD-10-CM | POA: Diagnosis not present

## 2017-01-16 DIAGNOSIS — M6281 Muscle weakness (generalized): Secondary | ICD-10-CM | POA: Diagnosis not present

## 2017-01-17 DIAGNOSIS — R278 Other lack of coordination: Secondary | ICD-10-CM | POA: Diagnosis not present

## 2017-01-17 DIAGNOSIS — G609 Hereditary and idiopathic neuropathy, unspecified: Secondary | ICD-10-CM | POA: Diagnosis not present

## 2017-01-17 DIAGNOSIS — M6281 Muscle weakness (generalized): Secondary | ICD-10-CM | POA: Diagnosis not present

## 2017-01-18 DIAGNOSIS — M6281 Muscle weakness (generalized): Secondary | ICD-10-CM | POA: Diagnosis not present

## 2017-01-18 DIAGNOSIS — G609 Hereditary and idiopathic neuropathy, unspecified: Secondary | ICD-10-CM | POA: Diagnosis not present

## 2017-01-18 DIAGNOSIS — R278 Other lack of coordination: Secondary | ICD-10-CM | POA: Diagnosis not present

## 2017-01-19 ENCOUNTER — Telehealth: Payer: Self-pay | Admitting: *Deleted

## 2017-01-19 DIAGNOSIS — G609 Hereditary and idiopathic neuropathy, unspecified: Secondary | ICD-10-CM | POA: Diagnosis not present

## 2017-01-19 DIAGNOSIS — R278 Other lack of coordination: Secondary | ICD-10-CM | POA: Diagnosis not present

## 2017-01-19 DIAGNOSIS — M6281 Muscle weakness (generalized): Secondary | ICD-10-CM | POA: Diagnosis not present

## 2017-01-19 NOTE — Telephone Encounter (Signed)
We can change to silver nitrate dressing or open to air more to allow it to dry out -Dr. Cannon Kettle

## 2017-01-19 NOTE — Telephone Encounter (Addendum)
[place] states treating right 1st toe with epsom salt soaks, and antibiotic ointment, area looks too moist, feels silver nitrate dressing 2 times week would benefit pt. 01/19/2017-Left message informing [place] of Dr. Leeanne Rio orders.

## 2017-01-20 DIAGNOSIS — G609 Hereditary and idiopathic neuropathy, unspecified: Secondary | ICD-10-CM | POA: Diagnosis not present

## 2017-01-20 DIAGNOSIS — R351 Nocturia: Secondary | ICD-10-CM | POA: Diagnosis not present

## 2017-01-20 DIAGNOSIS — N401 Enlarged prostate with lower urinary tract symptoms: Secondary | ICD-10-CM | POA: Diagnosis not present

## 2017-01-20 DIAGNOSIS — N200 Calculus of kidney: Secondary | ICD-10-CM | POA: Diagnosis not present

## 2017-01-20 DIAGNOSIS — R278 Other lack of coordination: Secondary | ICD-10-CM | POA: Diagnosis not present

## 2017-01-20 DIAGNOSIS — N3000 Acute cystitis without hematuria: Secondary | ICD-10-CM | POA: Diagnosis not present

## 2017-01-20 DIAGNOSIS — M6281 Muscle weakness (generalized): Secondary | ICD-10-CM | POA: Diagnosis not present

## 2017-01-23 DIAGNOSIS — R278 Other lack of coordination: Secondary | ICD-10-CM | POA: Diagnosis not present

## 2017-01-23 DIAGNOSIS — M6281 Muscle weakness (generalized): Secondary | ICD-10-CM | POA: Diagnosis not present

## 2017-01-23 DIAGNOSIS — G609 Hereditary and idiopathic neuropathy, unspecified: Secondary | ICD-10-CM | POA: Diagnosis not present

## 2017-01-24 DIAGNOSIS — R278 Other lack of coordination: Secondary | ICD-10-CM | POA: Diagnosis not present

## 2017-01-24 DIAGNOSIS — M6281 Muscle weakness (generalized): Secondary | ICD-10-CM | POA: Diagnosis not present

## 2017-01-24 DIAGNOSIS — G609 Hereditary and idiopathic neuropathy, unspecified: Secondary | ICD-10-CM | POA: Diagnosis not present

## 2017-01-25 DIAGNOSIS — G609 Hereditary and idiopathic neuropathy, unspecified: Secondary | ICD-10-CM | POA: Diagnosis not present

## 2017-01-25 DIAGNOSIS — R278 Other lack of coordination: Secondary | ICD-10-CM | POA: Diagnosis not present

## 2017-01-25 DIAGNOSIS — M6281 Muscle weakness (generalized): Secondary | ICD-10-CM | POA: Diagnosis not present

## 2017-01-26 DIAGNOSIS — R278 Other lack of coordination: Secondary | ICD-10-CM | POA: Diagnosis not present

## 2017-01-26 DIAGNOSIS — G609 Hereditary and idiopathic neuropathy, unspecified: Secondary | ICD-10-CM | POA: Diagnosis not present

## 2017-01-26 DIAGNOSIS — M6281 Muscle weakness (generalized): Secondary | ICD-10-CM | POA: Diagnosis not present

## 2017-01-27 ENCOUNTER — Other Ambulatory Visit: Payer: Self-pay | Admitting: Urology

## 2017-01-27 DIAGNOSIS — R278 Other lack of coordination: Secondary | ICD-10-CM | POA: Diagnosis not present

## 2017-01-27 DIAGNOSIS — G609 Hereditary and idiopathic neuropathy, unspecified: Secondary | ICD-10-CM | POA: Diagnosis not present

## 2017-01-27 DIAGNOSIS — M6281 Muscle weakness (generalized): Secondary | ICD-10-CM | POA: Diagnosis not present

## 2017-01-28 DIAGNOSIS — M6281 Muscle weakness (generalized): Secondary | ICD-10-CM | POA: Diagnosis not present

## 2017-01-28 DIAGNOSIS — R278 Other lack of coordination: Secondary | ICD-10-CM | POA: Diagnosis not present

## 2017-01-28 DIAGNOSIS — G609 Hereditary and idiopathic neuropathy, unspecified: Secondary | ICD-10-CM | POA: Diagnosis not present

## 2017-01-30 DIAGNOSIS — G609 Hereditary and idiopathic neuropathy, unspecified: Secondary | ICD-10-CM | POA: Diagnosis not present

## 2017-01-30 DIAGNOSIS — R278 Other lack of coordination: Secondary | ICD-10-CM | POA: Diagnosis not present

## 2017-01-30 DIAGNOSIS — M6281 Muscle weakness (generalized): Secondary | ICD-10-CM | POA: Diagnosis not present

## 2017-01-31 DIAGNOSIS — G609 Hereditary and idiopathic neuropathy, unspecified: Secondary | ICD-10-CM | POA: Diagnosis not present

## 2017-01-31 DIAGNOSIS — R278 Other lack of coordination: Secondary | ICD-10-CM | POA: Diagnosis not present

## 2017-01-31 DIAGNOSIS — M6281 Muscle weakness (generalized): Secondary | ICD-10-CM | POA: Diagnosis not present

## 2017-02-01 DIAGNOSIS — R278 Other lack of coordination: Secondary | ICD-10-CM | POA: Diagnosis not present

## 2017-02-01 DIAGNOSIS — G609 Hereditary and idiopathic neuropathy, unspecified: Secondary | ICD-10-CM | POA: Diagnosis not present

## 2017-02-01 DIAGNOSIS — M6281 Muscle weakness (generalized): Secondary | ICD-10-CM | POA: Diagnosis not present

## 2017-02-02 DIAGNOSIS — M6281 Muscle weakness (generalized): Secondary | ICD-10-CM | POA: Diagnosis not present

## 2017-02-02 DIAGNOSIS — R278 Other lack of coordination: Secondary | ICD-10-CM | POA: Diagnosis not present

## 2017-02-02 DIAGNOSIS — G609 Hereditary and idiopathic neuropathy, unspecified: Secondary | ICD-10-CM | POA: Diagnosis not present

## 2017-02-03 DIAGNOSIS — R278 Other lack of coordination: Secondary | ICD-10-CM | POA: Diagnosis not present

## 2017-02-03 DIAGNOSIS — M6281 Muscle weakness (generalized): Secondary | ICD-10-CM | POA: Diagnosis not present

## 2017-02-03 DIAGNOSIS — G609 Hereditary and idiopathic neuropathy, unspecified: Secondary | ICD-10-CM | POA: Diagnosis not present

## 2017-02-04 DIAGNOSIS — M6281 Muscle weakness (generalized): Secondary | ICD-10-CM | POA: Diagnosis not present

## 2017-02-04 DIAGNOSIS — R278 Other lack of coordination: Secondary | ICD-10-CM | POA: Diagnosis not present

## 2017-02-04 DIAGNOSIS — G609 Hereditary and idiopathic neuropathy, unspecified: Secondary | ICD-10-CM | POA: Diagnosis not present

## 2017-02-06 DIAGNOSIS — R278 Other lack of coordination: Secondary | ICD-10-CM | POA: Diagnosis not present

## 2017-02-06 DIAGNOSIS — G609 Hereditary and idiopathic neuropathy, unspecified: Secondary | ICD-10-CM | POA: Diagnosis not present

## 2017-02-06 DIAGNOSIS — M6281 Muscle weakness (generalized): Secondary | ICD-10-CM | POA: Diagnosis not present

## 2017-02-07 DIAGNOSIS — R278 Other lack of coordination: Secondary | ICD-10-CM | POA: Diagnosis not present

## 2017-02-07 DIAGNOSIS — G609 Hereditary and idiopathic neuropathy, unspecified: Secondary | ICD-10-CM | POA: Diagnosis not present

## 2017-02-07 DIAGNOSIS — M6281 Muscle weakness (generalized): Secondary | ICD-10-CM | POA: Diagnosis not present

## 2017-02-08 DIAGNOSIS — R278 Other lack of coordination: Secondary | ICD-10-CM | POA: Diagnosis not present

## 2017-02-08 DIAGNOSIS — G609 Hereditary and idiopathic neuropathy, unspecified: Secondary | ICD-10-CM | POA: Diagnosis not present

## 2017-02-08 DIAGNOSIS — M6281 Muscle weakness (generalized): Secondary | ICD-10-CM | POA: Diagnosis not present

## 2017-02-09 ENCOUNTER — Telehealth: Payer: Self-pay

## 2017-02-09 DIAGNOSIS — G609 Hereditary and idiopathic neuropathy, unspecified: Secondary | ICD-10-CM | POA: Diagnosis not present

## 2017-02-09 DIAGNOSIS — R278 Other lack of coordination: Secondary | ICD-10-CM | POA: Diagnosis not present

## 2017-02-09 DIAGNOSIS — M6281 Muscle weakness (generalized): Secondary | ICD-10-CM | POA: Diagnosis not present

## 2017-02-09 NOTE — Telephone Encounter (Signed)
Amy From [facility] called [phone]) requesting an extension of Ethan Rose for 3 additional weeks, verbal orders given.

## 2017-02-10 DIAGNOSIS — M6281 Muscle weakness (generalized): Secondary | ICD-10-CM | POA: Diagnosis not present

## 2017-02-10 DIAGNOSIS — R278 Other lack of coordination: Secondary | ICD-10-CM | POA: Diagnosis not present

## 2017-02-10 DIAGNOSIS — G609 Hereditary and idiopathic neuropathy, unspecified: Secondary | ICD-10-CM | POA: Diagnosis not present

## 2017-02-13 DIAGNOSIS — R278 Other lack of coordination: Secondary | ICD-10-CM | POA: Diagnosis not present

## 2017-02-13 DIAGNOSIS — M6281 Muscle weakness (generalized): Secondary | ICD-10-CM | POA: Diagnosis not present

## 2017-02-13 DIAGNOSIS — G609 Hereditary and idiopathic neuropathy, unspecified: Secondary | ICD-10-CM | POA: Diagnosis not present

## 2017-02-14 ENCOUNTER — Encounter: Payer: Self-pay | Admitting: Podiatry

## 2017-02-14 ENCOUNTER — Ambulatory Visit (INDEPENDENT_AMBULATORY_CARE_PROVIDER_SITE_OTHER): Payer: Medicare Other | Admitting: Podiatry

## 2017-02-14 DIAGNOSIS — M79675 Pain in left toe(s): Secondary | ICD-10-CM

## 2017-02-14 DIAGNOSIS — M79674 Pain in right toe(s): Secondary | ICD-10-CM

## 2017-02-14 DIAGNOSIS — R278 Other lack of coordination: Secondary | ICD-10-CM | POA: Diagnosis not present

## 2017-02-14 DIAGNOSIS — Z9889 Other specified postprocedural states: Secondary | ICD-10-CM

## 2017-02-14 DIAGNOSIS — B351 Tinea unguium: Secondary | ICD-10-CM

## 2017-02-14 DIAGNOSIS — M6281 Muscle weakness (generalized): Secondary | ICD-10-CM | POA: Diagnosis not present

## 2017-02-14 DIAGNOSIS — G609 Hereditary and idiopathic neuropathy, unspecified: Secondary | ICD-10-CM | POA: Diagnosis not present

## 2017-02-14 NOTE — Progress Notes (Signed)
This patient presents to the office 1 week following surgery for the removal of the great toenail on his right foot. This was performed by Dr. Cannon Kettle. This patient presents the office saying that he has a hole health nurse who recommended to Dr. Cannon Kettle to discontinue the soaks and initiate. air  drying  of his right great toe.  He says he is having no pain, discomfort, or even drainage from the right great toenail nail bed.  GENERAL APPEARANCE: Alert, conversant. Appropriately groomed. No acute distress.  Neurovascular status can be found from Dr.  Cannon Kettle previous noted.  DERMATOLOGIC: There appears to be a site at the proximal nail fold which is healing a phenol burn. There is no evidence of any pus or drainage noted from the surgical site.  The nail bed is covered with thickened fibrotic tissue.     Orthopedic  Contracted second toe digits noted with skin lesions presents in the absence of any ulceration  NAILS  Elongated nails both feet.   Elongated nails  S/P nail surgery.  ROV  the surgical site on the right hallux nail bed appears to be healing with no evidence of any infection or drainage. He was instructed to peroxide his toe daily as well as followed Dr. Leeanne Rio instructions to air dry the toe. His elongated nails were divided per his request. Return to the clinic in 2 weeks for evaluation by Dr. Lourdes Sledge DPM

## 2017-02-15 DIAGNOSIS — R278 Other lack of coordination: Secondary | ICD-10-CM | POA: Diagnosis not present

## 2017-02-15 DIAGNOSIS — M6281 Muscle weakness (generalized): Secondary | ICD-10-CM | POA: Diagnosis not present

## 2017-02-15 DIAGNOSIS — G609 Hereditary and idiopathic neuropathy, unspecified: Secondary | ICD-10-CM | POA: Diagnosis not present

## 2017-02-16 DIAGNOSIS — G609 Hereditary and idiopathic neuropathy, unspecified: Secondary | ICD-10-CM | POA: Diagnosis not present

## 2017-02-16 DIAGNOSIS — R278 Other lack of coordination: Secondary | ICD-10-CM | POA: Diagnosis not present

## 2017-02-16 DIAGNOSIS — M6281 Muscle weakness (generalized): Secondary | ICD-10-CM | POA: Diagnosis not present

## 2017-02-17 DIAGNOSIS — G609 Hereditary and idiopathic neuropathy, unspecified: Secondary | ICD-10-CM | POA: Diagnosis not present

## 2017-02-17 DIAGNOSIS — R278 Other lack of coordination: Secondary | ICD-10-CM | POA: Diagnosis not present

## 2017-02-17 DIAGNOSIS — M6281 Muscle weakness (generalized): Secondary | ICD-10-CM | POA: Diagnosis not present

## 2017-02-20 DIAGNOSIS — M6281 Muscle weakness (generalized): Secondary | ICD-10-CM | POA: Diagnosis not present

## 2017-02-20 DIAGNOSIS — R278 Other lack of coordination: Secondary | ICD-10-CM | POA: Diagnosis not present

## 2017-02-20 DIAGNOSIS — G609 Hereditary and idiopathic neuropathy, unspecified: Secondary | ICD-10-CM | POA: Diagnosis not present

## 2017-02-21 ENCOUNTER — Encounter (HOSPITAL_BASED_OUTPATIENT_CLINIC_OR_DEPARTMENT_OTHER): Payer: Self-pay | Admitting: *Deleted

## 2017-02-21 DIAGNOSIS — G609 Hereditary and idiopathic neuropathy, unspecified: Secondary | ICD-10-CM | POA: Diagnosis not present

## 2017-02-21 DIAGNOSIS — M6281 Muscle weakness (generalized): Secondary | ICD-10-CM | POA: Diagnosis not present

## 2017-02-21 DIAGNOSIS — R278 Other lack of coordination: Secondary | ICD-10-CM | POA: Diagnosis not present

## 2017-02-22 ENCOUNTER — Encounter (HOSPITAL_BASED_OUTPATIENT_CLINIC_OR_DEPARTMENT_OTHER): Payer: Self-pay | Admitting: *Deleted

## 2017-02-22 DIAGNOSIS — N3020 Other chronic cystitis without hematuria: Secondary | ICD-10-CM | POA: Diagnosis not present

## 2017-02-22 DIAGNOSIS — R8271 Bacteriuria: Secondary | ICD-10-CM | POA: Diagnosis not present

## 2017-02-23 ENCOUNTER — Encounter (HOSPITAL_BASED_OUTPATIENT_CLINIC_OR_DEPARTMENT_OTHER): Payer: Self-pay | Admitting: *Deleted

## 2017-02-23 DIAGNOSIS — R278 Other lack of coordination: Secondary | ICD-10-CM | POA: Diagnosis not present

## 2017-02-23 DIAGNOSIS — G609 Hereditary and idiopathic neuropathy, unspecified: Secondary | ICD-10-CM | POA: Diagnosis not present

## 2017-02-23 DIAGNOSIS — M6281 Muscle weakness (generalized): Secondary | ICD-10-CM | POA: Diagnosis not present

## 2017-02-23 NOTE — Progress Notes (Signed)
NPO AFTER MN W/ EXCEPTION CLEAR LIQUIDS UNTIL 0700 (NO CREAM [employer] PRODUCTS).  ARRIVE AT 1115.  NEEDS ISTAT.   CURRENT EKG IN CHART AND EPIC.  WILL TAKE AM RX MEDS DOS W/ SIPS OF WATER.  PT USES WHEELCHAIR DUE TO BILATERAL LEG WEAKNESS AND FOOT DROP.  PT STATES CAN STAND AND PIVOT TO STRETCHER.

## 2017-02-24 DIAGNOSIS — R278 Other lack of coordination: Secondary | ICD-10-CM | POA: Diagnosis not present

## 2017-02-24 DIAGNOSIS — M6281 Muscle weakness (generalized): Secondary | ICD-10-CM | POA: Diagnosis not present

## 2017-02-24 DIAGNOSIS — G609 Hereditary and idiopathic neuropathy, unspecified: Secondary | ICD-10-CM | POA: Diagnosis not present

## 2017-02-27 ENCOUNTER — Ambulatory Visit (HOSPITAL_BASED_OUTPATIENT_CLINIC_OR_DEPARTMENT_OTHER): Payer: Medicare Other | Admitting: Anesthesiology

## 2017-02-27 ENCOUNTER — Encounter (HOSPITAL_BASED_OUTPATIENT_CLINIC_OR_DEPARTMENT_OTHER)
Admission: RE | Disposition: A | Discharge status: Home / Self Care | Payer: Self-pay | Source: Ambulatory Visit | Attending: Urology

## 2017-02-27 ENCOUNTER — Ambulatory Visit (HOSPITAL_BASED_OUTPATIENT_CLINIC_OR_DEPARTMENT_OTHER)
Admission: RE | Admit: 2017-02-27 | Discharge: 2017-02-27 | Disposition: A | Discharge status: Home / Self Care | Payer: Medicare Other | Source: Ambulatory Visit | Attending: Urology | Admitting: Urology

## 2017-02-27 ENCOUNTER — Ambulatory Visit: Payer: Medicare Other | Admitting: Physical Medicine & Rehabilitation

## 2017-02-27 ENCOUNTER — Encounter (HOSPITAL_BASED_OUTPATIENT_CLINIC_OR_DEPARTMENT_OTHER): Payer: Self-pay | Admitting: Anesthesiology

## 2017-02-27 DIAGNOSIS — Z87891 Personal history of nicotine dependence: Secondary | ICD-10-CM | POA: Insufficient documentation

## 2017-02-27 DIAGNOSIS — R7303 Prediabetes: Secondary | ICD-10-CM | POA: Diagnosis not present

## 2017-02-27 DIAGNOSIS — Z79899 Other long term (current) drug therapy: Secondary | ICD-10-CM | POA: Insufficient documentation

## 2017-02-27 DIAGNOSIS — Z85828 Personal history of other malignant neoplasm of skin: Secondary | ICD-10-CM | POA: Diagnosis not present

## 2017-02-27 DIAGNOSIS — N202 Calculus of kidney with calculus of ureter: Secondary | ICD-10-CM | POA: Diagnosis not present

## 2017-02-27 DIAGNOSIS — M1990 Unspecified osteoarthritis, unspecified site: Secondary | ICD-10-CM | POA: Insufficient documentation

## 2017-02-27 DIAGNOSIS — Z833 Family history of diabetes mellitus: Secondary | ICD-10-CM | POA: Insufficient documentation

## 2017-02-27 DIAGNOSIS — Z466 Encounter for fitting and adjustment of urinary device: Secondary | ICD-10-CM | POA: Diagnosis not present

## 2017-02-27 DIAGNOSIS — Z8619 Personal history of other infectious and parasitic diseases: Secondary | ICD-10-CM | POA: Insufficient documentation

## 2017-02-27 DIAGNOSIS — N132 Hydronephrosis with renal and ureteral calculous obstruction: Secondary | ICD-10-CM | POA: Diagnosis not present

## 2017-02-27 DIAGNOSIS — D72829 Elevated white blood cell count, unspecified: Secondary | ICD-10-CM | POA: Diagnosis not present

## 2017-02-27 DIAGNOSIS — I10 Essential (primary) hypertension: Secondary | ICD-10-CM | POA: Insufficient documentation

## 2017-02-27 DIAGNOSIS — Z8249 Family history of ischemic heart disease and other diseases of the circulatory system: Secondary | ICD-10-CM | POA: Diagnosis not present

## 2017-02-27 DIAGNOSIS — R278 Other lack of coordination: Secondary | ICD-10-CM | POA: Diagnosis not present

## 2017-02-27 DIAGNOSIS — N200 Calculus of kidney: Secondary | ICD-10-CM | POA: Diagnosis not present

## 2017-02-27 DIAGNOSIS — K219 Gastro-esophageal reflux disease without esophagitis: Secondary | ICD-10-CM | POA: Diagnosis not present

## 2017-02-27 DIAGNOSIS — M479 Spondylosis, unspecified: Secondary | ICD-10-CM | POA: Diagnosis not present

## 2017-02-27 DIAGNOSIS — E559 Vitamin D deficiency, unspecified: Secondary | ICD-10-CM | POA: Insufficient documentation

## 2017-02-27 DIAGNOSIS — G609 Hereditary and idiopathic neuropathy, unspecified: Secondary | ICD-10-CM | POA: Diagnosis not present

## 2017-02-27 DIAGNOSIS — M6281 Muscle weakness (generalized): Secondary | ICD-10-CM | POA: Diagnosis not present

## 2017-02-27 HISTORY — DX: Carpal tunnel syndrome, unspecified upper limb: G56.00

## 2017-02-27 HISTORY — DX: Other symptoms and signs involving the musculoskeletal system: R29.898

## 2017-02-27 HISTORY — DX: Personal history of other infectious and parasitic diseases: Z86.19

## 2017-02-27 HISTORY — DX: Personal history of other specified conditions: Z87.898

## 2017-02-27 HISTORY — DX: Spondylosis without myelopathy or radiculopathy, site unspecified: M47.819

## 2017-02-27 HISTORY — DX: History of falling: Z91.81

## 2017-02-27 HISTORY — DX: Foot drop, left foot: M21.371

## 2017-02-27 HISTORY — DX: Difficulty in walking, not elsewhere classified: R26.2

## 2017-02-27 HISTORY — DX: Foot drop, right foot: M21.372

## 2017-02-27 HISTORY — DX: Personal history of other diseases of the musculoskeletal system and connective tissue: Z87.39

## 2017-02-27 HISTORY — DX: Other specified postprocedural states: Z85.9

## 2017-02-27 HISTORY — DX: Other specified postprocedural states: Z98.890

## 2017-02-27 HISTORY — PX: CYSTOSCOPY/RETROGRADE/URETEROSCOPY/STONE EXTRACTION WITH BASKET: SHX5317

## 2017-02-27 HISTORY — PX: CYSTOSCOPY WITH STENT PLACEMENT: SHX5790

## 2017-02-27 HISTORY — DX: Spondylolisthesis, lumbar region: M43.16

## 2017-02-27 HISTORY — DX: Dependence on wheelchair: Z99.3

## 2017-02-27 HISTORY — DX: Calculus of kidney: N20.0

## 2017-02-27 HISTORY — DX: Personal history of urinary calculi: Z87.442

## 2017-02-27 LAB — POCT I-STAT, CHEM 8
BUN: 15 mg/dL (ref 6–20)
CALCIUM ION: 1.18 mmol/L (ref 1.15–1.40)
CHLORIDE: 98 mmol/L — AB (ref 101–111)
Creatinine, Ser: 0.5 mg/dL — ABNORMAL LOW (ref 0.61–1.24)
Glucose, Bld: 152 mg/dL — ABNORMAL HIGH (ref 65–99)
HEMATOCRIT: 40 % (ref 39.0–52.0)
HEMOGLOBIN: 13.6 g/dL (ref 13.0–17.0)
Potassium: 4.1 mmol/L (ref 3.5–5.1)
Sodium: 136 mmol/L (ref 135–145)
TCO2: 28 mmol/L (ref 0–100)

## 2017-02-27 SURGERY — CYSTOSCOPY, WITH CALCULUS REMOVAL USING BASKET
Anesthesia: General | Site: Renal | Laterality: Bilateral

## 2017-02-27 MED ORDER — LACTATED RINGERS IV SOLN
INTRAVENOUS | Status: DC
  Administered 2017-02-27: 13:00:00 via INTRAVENOUS
  Filled 2017-02-27: qty 1000

## 2017-02-27 MED ORDER — ONDANSETRON HCL 4 MG/2ML IJ SOLN
4.0000 mg | Freq: Once | INTRAMUSCULAR | Status: DC | PRN
  Filled 2017-02-27: qty 2

## 2017-02-27 MED ORDER — ONDANSETRON HCL 4 MG/2ML IJ SOLN
INTRAMUSCULAR | Status: AC
  Filled 2017-02-27: qty 2

## 2017-02-27 MED ORDER — CEFAZOLIN SODIUM-DEXTROSE 2-4 GM/100ML-% IV SOLN
INTRAVENOUS | Status: AC
  Filled 2017-02-27: qty 100

## 2017-02-27 MED ORDER — LIDOCAINE 2% (20 MG/ML) 5 ML SYRINGE
INTRAMUSCULAR | Status: DC | PRN
  Administered 2017-02-27: 100 mg via INTRAVENOUS

## 2017-02-27 MED ORDER — PROPOFOL 10 MG/ML IV BOLUS
INTRAVENOUS | Status: DC | PRN
  Administered 2017-02-27: 150 mg via INTRAVENOUS
  Administered 2017-02-27: 50 mg via INTRAVENOUS

## 2017-02-27 MED ORDER — FENTANYL CITRATE (PF) 100 MCG/2ML IJ SOLN
INTRAMUSCULAR | Status: DC | PRN
  Administered 2017-02-27: 50 ug via INTRAVENOUS

## 2017-02-27 MED ORDER — FENTANYL CITRATE (PF) 100 MCG/2ML IJ SOLN
25.0000 ug | INTRAMUSCULAR | Status: DC | PRN
  Filled 2017-02-27: qty 1

## 2017-02-27 MED ORDER — DEXAMETHASONE SODIUM PHOSPHATE 10 MG/ML IJ SOLN
INTRAMUSCULAR | Status: AC
  Filled 2017-02-27: qty 1

## 2017-02-27 MED ORDER — SODIUM CHLORIDE 0.9 % IR SOLN
Status: DC | PRN
  Administered 2017-02-27: 1000 mL
  Administered 2017-02-27: 3000 mL via INTRAVESICAL

## 2017-02-27 MED ORDER — PROPOFOL 10 MG/ML IV BOLUS
INTRAVENOUS | Status: AC
  Filled 2017-02-27: qty 20

## 2017-02-27 MED ORDER — IOHEXOL 300 MG/ML  SOLN
INTRAMUSCULAR | Status: DC | PRN
  Administered 2017-02-27: 14 mL via URETHRAL

## 2017-02-27 MED ORDER — FENTANYL CITRATE (PF) 100 MCG/2ML IJ SOLN
INTRAMUSCULAR | Status: AC
  Filled 2017-02-27: qty 2

## 2017-02-27 MED ORDER — CEFAZOLIN SODIUM-DEXTROSE 2-4 GM/100ML-% IV SOLN
2.0000 g | INTRAVENOUS | Status: AC
  Administered 2017-02-27: 2 g via INTRAVENOUS
  Filled 2017-02-27: qty 100

## 2017-02-27 MED ORDER — OXYCODONE-ACETAMINOPHEN 5-325 MG PO TABS: 1.0000 | ORAL_TABLET | ORAL | 0 refills | Status: DC | PRN

## 2017-02-27 MED ORDER — CEFAZOLIN IN D5W 1 GM/50ML IV SOLN
1.0000 g | INTRAVENOUS | Status: DC
  Filled 2017-02-27: qty 50

## 2017-02-27 MED ORDER — LIDOCAINE 2% (20 MG/ML) 5 ML SYRINGE
INTRAMUSCULAR | Status: AC
  Filled 2017-02-27: qty 5

## 2017-02-27 SURGICAL SUPPLY — 30 items
BAG DRAIN URO-CYSTO SKYTR STRL (DRAIN) ×4 IMPLANT
BAG DRN UROCATH (DRAIN) ×2
BASKET DAKOTA 1.9FR 11X120 (BASKET) IMPLANT
BASKET LASER NITINOL 1.9FR (BASKET) IMPLANT
BASKET STONE 1.7 NGAGE (UROLOGICAL SUPPLIES) ×3 IMPLANT
BASKET ZERO TIP NITINOL 2.4FR (BASKET) IMPLANT
BSKT STON RTRVL 120 1.9FR (BASKET)
BSKT STON RTRVL ZERO TP 2.4FR (BASKET)
CATH INTERMIT  6FR 70CM (CATHETERS) ×3 IMPLANT
CLOTH BEACON ORANGE TIMEOUT ST (SAFETY) ×4 IMPLANT
EVACUATOR MICROVAS BLADDER (UROLOGICAL SUPPLIES) IMPLANT
FIBER LASER FLEXIVA 365 (UROLOGICAL SUPPLIES) IMPLANT
FIBER LASER TRAC TIP (UROLOGICAL SUPPLIES) IMPLANT
GLOVE BIO SURGEON STRL SZ8 (GLOVE) ×4 IMPLANT
GOWN STRL REUS W/ TWL LRG LVL3 (GOWN DISPOSABLE) ×2 IMPLANT
GOWN STRL REUS W/ TWL XL LVL3 (GOWN DISPOSABLE) ×2 IMPLANT
GOWN STRL REUS W/TWL LRG LVL3 (GOWN DISPOSABLE) ×4
GOWN STRL REUS W/TWL XL LVL3 (GOWN DISPOSABLE) ×4
GUIDEWIRE ANG ZIPWIRE 038X150 (WIRE) ×4 IMPLANT
GUIDEWIRE STR DUAL SENSOR (WIRE) ×3 IMPLANT
IV NS IRRIG 3000ML ARTHROMATIC (IV SOLUTION) ×4 IMPLANT
KIT RM TURNOVER CYSTO AR (KITS) ×4 IMPLANT
MANIFOLD NEPTUNE II (INSTRUMENTS) ×3 IMPLANT
PACK CYSTO (CUSTOM PROCEDURE TRAY) ×4 IMPLANT
SHEATH ACCESS URETERAL 38CM (SHEATH) ×3 IMPLANT
STENT URET 6FRX26 CONTOUR (STENTS) ×6 IMPLANT
SYRINGE 10CC LL (SYRINGE) ×4 IMPLANT
TUBE CONNECTING 12'X1/4 (SUCTIONS) ×1
TUBE CONNECTING 12X1/4 (SUCTIONS) ×2 IMPLANT
TUBE FEEDING 8FR 16IN STR KANG (MISCELLANEOUS) IMPLANT

## 2017-02-27 NOTE — Discharge Instructions (Signed)
Ureteral Stent Implantation, Care After Refer to this sheet in the next few weeks. These instructions provide you with information about caring for yourself after your procedure. Your health care provider may also give you more specific instructions. Your treatment has been planned according to current medical practices, but problems sometimes occur. Call your health care provider if you have any problems or questions after your procedure. What can I expect after the procedure? After the procedure, it is common to have:  Nausea.  Mild pain when you urinate. You may feel this pain in your lower back or lower abdomen. Pain should stop within a few minutes after you urinate. This may last for up to 1 week.  A small amount of blood in your urine for several days. Follow these instructions at home:   Medicines   Take over-the-counter and prescription medicines only as told by your health care provider.  If you were prescribed an antibiotic medicine, take it as told by your health care provider. Do not stop taking the antibiotic even if you start to feel better.  Do not drive for 24 hours if you received a sedative.  Do not drive or operate heavy machinery while taking prescription pain medicines. Activity   Return to your normal activities as told by your health care provider. Ask your health care provider what activities are safe for you.  Do not lift anything that is heavier than 10 lb (4.5 kg). Follow this limit for 1 week after your procedure, or for as long as told by your health care provider. General instructions   Watch for any blood in your urine. Call your health care provider if the amount of blood in your urine increases.  If you have a catheter:  Follow instructions from your health care provider about taking care of your catheter and collection bag.  Do not take baths, swim, or use a hot tub until your health care provider approves.  Drink enough fluid to keep your urine  clear or pale yellow.  Keep all follow-up visits as told by your health care provider. This is important. Contact a health care provider if:  You have pain that gets worse or does not get better with medicine, especially pain when you urinate.  You have difficulty urinating.  You feel nauseous or you vomit repeatedly during a period of more than 2 days after the procedure. Get help right away if:  Your urine is dark red or has blood clots in it.  You are leaking urine (have incontinence).  The end of the stent comes out of your urethra.  You cannot urinate.  You have sudden, sharp, or severe pain in your abdomen or lower back.  You have a fever. This information is not intended to replace advice given to you by your health care provider. Make sure you discuss any questions you have with your health care provider. Document Released: 07/03/2013 Document Revised: 04/07/2016 Document Reviewed: 05/15/2015 Elsevier Interactive Patient Education  2017 [place] [facility] Instructions  Activity: Get plenty of rest for the remainder of the day. A responsible individual must stay with you for 24 hours following the procedure.  For the next 24 hours, DO NOT: -Drive a car -Paediatric nurse -Drink alcoholic beverages -Take any medication unless instructed by your physician -Make any legal decisions or sign important papers.  Meals: Start with liquid foods such as gelatin or soup. Progress to regular foods as tolerated. Avoid greasy, spicy, heavy  foods. If nausea and/or vomiting occur, drink only clear liquids until the nausea and/or vomiting subsides. Call your physician if vomiting continues.  Special Instructions/Symptoms: Your throat may feel dry or sore from the anesthesia or the breathing tube placed in your throat during surgery. If this causes discomfort, gargle with warm salt water. The discomfort should disappear within 24 hours.  If you had a  scopolamine patch placed behind your ear for the management of post- operative nausea and/or vomiting:  1. The medication in the patch is effective for 72 hours, after which it should be removed.  Wrap patch in a tissue and discard in the trash. Wash hands thoroughly with soap and water. 2. You may remove the patch earlier than 72 hours if you experience unpleasant side effects which may include dry mouth, dizziness or visual disturbances. 3. Avoid touching the patch. Wash your hands with soap and water after contact with the patch.

## 2017-02-27 NOTE — Anesthesia Procedure Notes (Deleted)
Performed by: Desmin Daleo T       

## 2017-02-27 NOTE — H&P (Signed)
Urology Admission H&P  Chief Complaint: bilateral renal calculi  History of Present Illness: Ethan Rose is a 71yo with a hx of nephrolithiasis who has increasing bilateral stone burden  Past Medical History:  Diagnosis Date  . Allergic rhinitis   . At high risk for falls   . Bilateral leg weakness    uses wheelchair  . BPH with obstruction/lower urinary tract symptoms   . BPPV (benign paroxysmal positional vertigo) 03/30/2012  . Carpal tunnel syndrome   . Cervical myelopathy ([place])   . Chronic low back pain   . Degenerative spinal arthritis   . Essential hypertension   . Foot drop, bilateral    multiple back sx's  (does use AFO's , but does have them)  . GERD (gastroesophageal reflux disease)   . History of cardiac murmur as a child   . History of gout    left hand  . History of kidney stones    since 1997  . History of sepsis    09/ 2017 urosepsis  . History of squamous cell carcinoma excision    right thigh 03/ 2016  . Inability to walk    since sepsis 09/ 2017 per pt left him severely debilitated due to weakness  . Olecranon bursitis 2020/04/1216   Dr. Percell Miller  . Prediabetes 2015   HbA1c 6.4%: pt saw nutritionist  . Renal calculi    bilateral   . Slow transit constipation   . Spondylolisthesis of lumbar region   . Uses wheelchair    [phone] septic shock -- residual bilateral leg weakness w/ inability to walk  . Vitamin D deficiency    Past Surgical History:  Procedure Laterality Date  . ABDOMINAL EXPOSURE N/A 03/24/2015   Procedure: ABDOMINAL EXPOSURE;  Surgeon: [name], MD;  Location: [facility]S;  Service: Vascular;  Laterality: N/A;  left side approach  . ANTERIOR CERVICAL DECOMP/DISCECTOMY FUSION N/A 03/18/2016   Procedure: Cervical four-five Cervical five-six Cervical six-seven Anterior cervical decompression/diskectomy/fusion;  Surgeon: [name], MD;  Location: [place] [facility];  Service: Neurosurgery;  Laterality: N/A;  n  . ANTERIOR LAT LUMBAR  FUSION Right 03/24/2015   Procedure: Right Lumbar two-Three,Lumbar Three-Four,Lumbar Four-Five Anterior lateral lumbar interbody fusion ;  Surgeon: [name], MD;  Location: [place] NEURO ORS;  Service: Neurosurgery;  Laterality: Right;  right  . COLONOSCOPY  approx 2011   Dr. [name] (normal per pt report)  . CYSTOSCOPY WITH RETROGRADE PYELOGRAM, URETEROSCOPY AND STENT PLACEMENT Left 08/15/2016   Procedure: CYSTOSCOPY WITH STENT REMOVAL,  RETROGRADES, URETEROSCOPY, STONE BASKETRY AND STENT REPLACEMENT;  Surgeon: [name], MD;  Location: [facility] ORS;  Service: Urology;  Laterality: Left;  . CYSTOSCOPY WITH STENT PLACEMENT Left 07/16/2016   Procedure: CYSTOSCOPY, RETROGRADE WITH LEFT URETERAL STENT PLACEMENT;  Surgeon: [name], MD;  Location: [facility] ORS;  Service: Urology;  Laterality: Left;  . HOLMIUM LASER APPLICATION Left 46/07/6294   Procedure: HOLMIUM LASER;  Surgeon: [name], MD;  Location: [facility] ORS;  Service: Urology;  Laterality: Left;  . INGUINAL HERNIA REPAIR Bilateral 1993  . POSTERIOR LUMBAR FUSION 4 LEVEL N/A 03/27/2015   Procedure: Posterior pedicle subtraction osteotomy with T10 to Iliac fusion with Dr. [name] assisting;  Surgeon: [name], MD;  Location: [facility] NEURO ORS;  Service: Neurosurgery;  Laterality: N/A;  Posterior pedicle subtraction osteotomy with T10 to Iliac fusion with Dr. [name] assisting  . TONSILLECTOMY  1954    Home Medications:  Prescriptions Prior to Admission  Medication Sig  Dispense Refill Last Dose  . alfuzosin (UROXATRAL) 10 MG 24 hr tablet Take 10 mg by mouth at bedtime.    02/26/2017 at Unknown time  . cefpodoxime (VANTIN) 100 MG tablet Take 100 mg by mouth 2 (two) times daily.   02/27/2017 at 0630  . Cholecalciferol (VITAMIN D3) 5000 units CAPS Take 1 capsule by mouth daily.   Past Month at Unknown time  . Menaquinone-7 (VITAMIN K2 PO) Take 1 tablet by mouth daily.    Past Month at Unknown time  . Misc Natural Products  (RESVERATROL ULTRA) CAPS Take 3 capsules by mouth daily. Also contains CHELATOIN   Past Month at Unknown time  . Multiple Vitamins-Minerals (MULTIVITAMIN WITH MINERALS) tablet Take 1 tablet by mouth daily.   Past Month at Unknown time  . Naproxen Sodium (ALEVE) 220 MG CAPS Take by mouth as needed.   Past Week at Unknown time  . nitrofurantoin (MACRODANTIN) 100 MG capsule Take 100 mg by mouth 2 (two) times daily.   Past Week at Unknown time  . OVER THE COUNTER MEDICATION Take 1 Dose by mouth daily. Over the counter OPC3 and Isochrome powders 1 capful of both mixed in 4 ounces of water daily   Past Month at Unknown time  . OVER THE COUNTER MEDICATION Take 1 capsule by mouth daily. neuroquell   Past Month at Unknown time  . OVER THE COUNTER MEDICATION Take 1 tablet by mouth 2 (two) times daily. Cardio Cover   Past Month at Unknown time  . OVER THE COUNTER MEDICATION Take 1 capsule by mouth daily. VITAL ARTERY CLEANSE   Past Month at Unknown time  . PAPAYA PO Take 3 tablets by mouth daily as needed (heartburn).    Past Month at Unknown time  . Potassium Citrate (UROCIT-K 15) 15 MEQ (1620 MG) TBCR Take 1 tablet by mouth 2 (two) times daily.   02/27/2017 at 0630  . Probiotic Product (PROBIOTIC PO) Take 50 mg by mouth daily.   Past Month at Unknown time  . Turmeric Curcumin 500 MG CAPS Take 1 capsule by mouth every other day.   Past Month at Unknown time  . acetaminophen (TYLENOL) 500 MG tablet Take 500 mg by mouth every 6 (six) hours as needed.   More than a month at Unknown time  . Zinc Methionate 50 MG CAPS Take 50 mg by mouth daily.   More than a month at Unknown time   Allergies: No Known Allergies  Family History  Problem Relation Age of Onset  . Stroke Mother   . Diabetes Mother   . Heart disease Mother   . Hypertension Mother   . Heart attack Mother   . Stroke Father   . Heart disease Father   . Diabetes Brother   . Hypertension Brother    Social History:  reports that he quit smoking  about 3 years ago. His smoking use included Cigarettes. He has a 50.00 pack-year smoking history. He has never used smokeless tobacco. He reports that he drinks about 4.8 oz of alcohol per week . He reports that he does not use drugs.  Review of Systems  All other systems reviewed and are negative.   Physical Exam:  Vital signs in last 24 hours: Temp:  [98.4 F (36.9 C)] 98.4 F (36.9 C) (04/16 1112) Pulse Rate:  [82] 82 (04/16 1112) Resp:  [16] 16 (04/16 1112) BP: (148)/(83) 148/83 (04/16 1112) SpO2:  [99 %] 99 % (04/16 1112) Weight:  [71.7 kg (158  lb)] 71.7 kg (158 lb) (04/16 1112) Physical Exam  Constitutional: He is oriented to person, place, and time. He appears well-developed and well-nourished.  HENT:  Head: Normocephalic and atraumatic.  Eyes: EOM are normal. Pupils are equal, round, and reactive to light.  Neck: Normal range of motion. No thyromegaly present.  Cardiovascular: Normal rate and regular rhythm.   Respiratory: Effort normal. No respiratory distress.  GI: Soft. He exhibits no distension.  Musculoskeletal: Normal range of motion. He exhibits no edema.  Neurological: He is alert and oriented to person, place, and time.  Skin: Skin is warm and dry.  Psychiatric: He has a normal mood and affect. His behavior is normal. Judgment and thought content normal.    Laboratory Data:  Results for orders placed or performed during the hospital encounter of 02/27/17 (from the past 24 hour(s))  I-STAT, chem 8     Status: Abnormal   Collection Time: 02/27/17 12:23 PM  Result Value Ref Range   Sodium 136 135 - 145 mmol/L   Potassium 4.1 3.5 - 5.1 mmol/L   Chloride 98 (L) 101 - 111 mmol/L   BUN 15 6 - 20 mg/dL   Creatinine, Ser 0.50 (L) 0.61 - 1.24 mg/dL   Glucose, Bld 152 (H) 65 - 99 mg/dL   Calcium, Ion 1.18 1.15 - 1.40 mmol/L   TCO2 28 0 - 100 mmol/L   Hemoglobin 13.6 13.0 - 17.0 g/dL   HCT 40.0 39.0 - 52.0 %   No results found for this or any previous visit (from  the past 240 hour(s)). Creatinine:  Recent Labs  02/27/17 1223  CREATININE 0.50*   Baseline Creatinine: 0.5  Impression/Assessment:  70yo with bilateral renal calculi  Plan:  The risks/benefits/alternatives to bilateral URS was explained to the patient and they understand and wish to proceed with surgery  Ethan Rose 02/27/2017, 12:57 PM

## 2017-02-27 NOTE — Transfer of Care (Signed)
Immediate Anesthesia Transfer of Care Note  Patient: Ethan Rose  Procedure(s) Performed: Procedure(s): CYSTOSCOPY/RETROGRADE/URETEROSCOPY/STONE EXTRACTION WITH BASKET (Bilateral) CYSTOSCOPY WITH STENT PLACEMENT (Bilateral)  Patient Location: PACU  Anesthesia Type:General  Level of Consciousness: awake, alert  and oriented  Airway & Oxygen Therapy: Patient Spontanous Breathing and Patient connected to nasal cannula oxygen  Post-op Assessment: Report given to RN  Post vital signs: Reviewed and stable  Last Vitals:  Vitals:   02/27/17 1112 02/27/17 1415  BP: (!) 148/83 (!) 144/79  Pulse: 82 60  Resp: 16 12  Temp: 36.9 C 36.5 C    Last Pain:  Vitals:   02/27/17 1112  TempSrc: Oral      Patients Stated Pain Goal: 7 (79/44/46 1901)  Complications: No apparent anesthesia complications

## 2017-02-27 NOTE — Anesthesia Procedure Notes (Signed)
Procedure Name: LMA Insertion Date/Time: 02/27/2017 1:09 PM Performed by: Bethena Roys T Pre-anesthesia Checklist: Patient identified, Emergency Drugs available, Suction available and Patient being monitored Patient Re-evaluated:Patient Re-evaluated prior to inductionOxygen Delivery Method: Circle system utilized Preoxygenation: Pre-oxygenation with 100% oxygen Intubation Type: IV induction Ventilation: Mask ventilation without difficulty LMA: LMA flexible inserted LMA Size: 5.0 Number of attempts: 2 Airway Equipment and Method: Bite block Placement Confirmation: positive ETCO2 Tube secured with: Tape Dental Injury: Teeth and Oropharynx as per pre-operative assessment

## 2017-02-27 NOTE — Brief Op Note (Signed)
02/27/2017  2:13 PM  PATIENT:  Ethan Rose  71 y.o. male  PRE-OPERATIVE DIAGNOSIS:  BILATERAL RENAL CALCULI  POST-OPERATIVE DIAGNOSIS:  BILATERAL RENAL CALCULI  PROCEDURE:  Procedure(s): CYSTOSCOPY/RETROGRADE/URETEROSCOPY/STONE EXTRACTION WITH BASKET (Bilateral) CYSTOSCOPY WITH STENT PLACEMENT (Bilateral)  SURGEON:  Surgeon(s) and Role:    * [name], MD - Primary  PHYSICIAN ASSISTANT:   ASSISTANTS: none   ANESTHESIA:   general  EBL:  Total I/O In: 62 [I.V.:600] Out: 5 [Blood:5]  BLOOD ADMINISTERED:none  DRAINS: bilateral 6x26 JJ ureteral stents without tethers   LOCAL MEDICATIONS USED:  NONE  SPECIMEN:  Source of Specimen:  bilateral renal calculi  DISPOSITION OF SPECIMEN:  N/A  COUNTS:  YES  TOURNIQUET:  * No tourniquets in log *  DICTATION: .Note written in EPIC  PLAN OF CARE: Discharge to home after PACU  PATIENT DISPOSITION:  PACU - hemodynamically stable.   Delay start of Pharmacological VTE agent (>24hrs) due to surgical blood loss or risk of bleeding: not applicable

## 2017-02-27 NOTE — Anesthesia Preprocedure Evaluation (Signed)
Anesthesia Evaluation  Patient identified by MRN, date of birth, ID band Patient awake    Reviewed: Allergy & Precautions, NPO status , Patient's Chart, lab work & pertinent test results  Airway Mallampati: II  TM Distance: >3 FB Neck ROM: Full    Dental  (+) Teeth Intact, Dental Advisory Given   Pulmonary former smoker,    Pulmonary exam normal breath sounds clear to auscultation       Cardiovascular hypertension, Normal cardiovascular exam Rhythm:Regular Rate:Normal     Neuro/Psych Cervical myelopathy Bilateral leg weakness  Neuromuscular disease negative psych ROS   GI/Hepatic Neg liver ROS, GERD  ,  Endo/Other  negative endocrine ROS  Renal/GU Nephrolithiasis      Musculoskeletal  (+) Arthritis , Osteoarthritis,    Abdominal   Peds  Hematology negative hematology ROS (+)   Anesthesia Other Findings Day of surgery medications reviewed with the patient.  Reproductive/Obstetrics                             Anesthesia Physical Anesthesia Plan  ASA: III  Anesthesia Plan: General   Post-op Pain Management:    Induction: Intravenous  Airway Management Planned: LMA  Additional Equipment:   Intra-op Plan:   Post-operative Plan: Extubation in OR  Informed Consent: I have reviewed the patients History and Physical, chart, labs and discussed the procedure including the risks, benefits and alternatives for the proposed anesthesia with the patient or authorized representative who has indicated his/her understanding and acceptance.   Dental advisory given  Plan Discussed with: CRNA  Anesthesia Plan Comments: (Risks/benefits of general anesthesia discussed with patient including risk of damage to teeth, lips, gum, and tongue, nausea/vomiting, allergic reactions to medications, and the possibility of heart attack, stroke and death.  All patient questions answered.  Patient wishes to  proceed.)        Anesthesia Quick Evaluation

## 2017-02-28 ENCOUNTER — Encounter (HOSPITAL_BASED_OUTPATIENT_CLINIC_OR_DEPARTMENT_OTHER): Payer: Self-pay | Admitting: Urology

## 2017-02-28 DIAGNOSIS — R278 Other lack of coordination: Secondary | ICD-10-CM | POA: Diagnosis not present

## 2017-02-28 DIAGNOSIS — M6281 Muscle weakness (generalized): Secondary | ICD-10-CM | POA: Diagnosis not present

## 2017-02-28 DIAGNOSIS — G609 Hereditary and idiopathic neuropathy, unspecified: Secondary | ICD-10-CM | POA: Diagnosis not present

## 2017-02-28 NOTE — Anesthesia Postprocedure Evaluation (Signed)
Anesthesia Post Note  Patient: Ethan Rose  Procedure(s) Performed: Procedure(s) (LRB): CYSTOSCOPY/RETROGRADE/URETEROSCOPY/STONE EXTRACTION WITH BASKET (Bilateral) CYSTOSCOPY WITH STENT PLACEMENT (Bilateral)  Patient location during evaluation: PACU Anesthesia Type: General Level of consciousness: awake and alert Pain management: pain level controlled Vital Signs Assessment: post-procedure vital signs reviewed and stable Respiratory status: spontaneous breathing, nonlabored ventilation, respiratory function stable and patient connected to nasal cannula oxygen Cardiovascular status: blood pressure returned to baseline and stable Postop Assessment: no signs of nausea or vomiting Anesthetic complications: no        Last Vitals:  Vitals:   02/27/17 1500 02/27/17 1545  BP: (!) 163/89 139/66  Pulse: 66 68  Resp: 20 16  Temp:  36.6 C    Last Pain:  Vitals:   02/27/17 1112  TempSrc: Oral   Pain Goal: Patients Stated Pain Goal: 7 (02/27/17 1154)               Catalina Gravel

## 2017-03-01 DIAGNOSIS — G609 Hereditary and idiopathic neuropathy, unspecified: Secondary | ICD-10-CM | POA: Diagnosis not present

## 2017-03-01 DIAGNOSIS — M6281 Muscle weakness (generalized): Secondary | ICD-10-CM | POA: Diagnosis not present

## 2017-03-01 DIAGNOSIS — R278 Other lack of coordination: Secondary | ICD-10-CM | POA: Diagnosis not present

## 2017-03-02 DIAGNOSIS — M6281 Muscle weakness (generalized): Secondary | ICD-10-CM | POA: Diagnosis not present

## 2017-03-02 DIAGNOSIS — G609 Hereditary and idiopathic neuropathy, unspecified: Secondary | ICD-10-CM | POA: Diagnosis not present

## 2017-03-02 DIAGNOSIS — R278 Other lack of coordination: Secondary | ICD-10-CM | POA: Diagnosis not present

## 2017-03-03 DIAGNOSIS — M6281 Muscle weakness (generalized): Secondary | ICD-10-CM | POA: Diagnosis not present

## 2017-03-03 DIAGNOSIS — R278 Other lack of coordination: Secondary | ICD-10-CM | POA: Diagnosis not present

## 2017-03-03 DIAGNOSIS — G609 Hereditary and idiopathic neuropathy, unspecified: Secondary | ICD-10-CM | POA: Diagnosis not present

## 2017-03-06 DIAGNOSIS — R278 Other lack of coordination: Secondary | ICD-10-CM | POA: Diagnosis not present

## 2017-03-06 DIAGNOSIS — N200 Calculus of kidney: Secondary | ICD-10-CM | POA: Diagnosis not present

## 2017-03-06 DIAGNOSIS — G609 Hereditary and idiopathic neuropathy, unspecified: Secondary | ICD-10-CM | POA: Diagnosis not present

## 2017-03-06 DIAGNOSIS — M6281 Muscle weakness (generalized): Secondary | ICD-10-CM | POA: Diagnosis not present

## 2017-03-06 LAB — BASIC METABOLIC PANEL
BUN: 22 mg/dL — AB (ref 4–21)
CREATININE: 0.4 mg/dL — AB (ref <=1.3)
GLUCOSE: 204 mg/dL
POTASSIUM: 4.9 mmol/L (ref 3.4–5.3)
Sodium: 135 mmol/L — AB (ref 137–147)

## 2017-03-06 LAB — CBC AND DIFFERENTIAL
HCT: 37 % — AB (ref 41–53)
HEMOGLOBIN: 12.5 g/dL — AB (ref 13.5–17.5)
Platelets: 376 10*3/uL (ref 150–399)
WBC: 15.2 10*3/mL

## 2017-03-07 DIAGNOSIS — G609 Hereditary and idiopathic neuropathy, unspecified: Secondary | ICD-10-CM | POA: Diagnosis not present

## 2017-03-07 DIAGNOSIS — R278 Other lack of coordination: Secondary | ICD-10-CM | POA: Diagnosis not present

## 2017-03-07 DIAGNOSIS — M6281 Muscle weakness (generalized): Secondary | ICD-10-CM | POA: Diagnosis not present

## 2017-03-08 ENCOUNTER — Encounter: Payer: Self-pay | Admitting: Family Medicine

## 2017-03-08 DIAGNOSIS — R278 Other lack of coordination: Secondary | ICD-10-CM | POA: Diagnosis not present

## 2017-03-08 DIAGNOSIS — G609 Hereditary and idiopathic neuropathy, unspecified: Secondary | ICD-10-CM | POA: Diagnosis not present

## 2017-03-08 DIAGNOSIS — M6281 Muscle weakness (generalized): Secondary | ICD-10-CM | POA: Diagnosis not present

## 2017-03-08 NOTE — Op Note (Signed)
Marland KitchenPreoperative diagnosis: bilateral renal calculi  Postoperative diagnosis: Same  Procedure: 1 cystoscopy 2. bilateralretrograde pyelography 3.  Intraoperative fluoroscopy, under one hour, with interpretation 4.  Bilateral ureteroscopic stone manipulation with basket extraction 5.  bilateral 6 x 26 JJ stent placement  Attending: Rosie Fate  Anesthesia: General  Estimated blood loss: None  Drains: bilateral 6 x 26 JJ ureteral stent without tether  Specimens: stone for analysis  Antibiotics: ancef  Findings: bilateral mid and lower pole renal calculi. No hydronephrosis. No masses/lesions in the bladder. Ureteral orifices in normal anatomic location.  Indications: Patient is a 71 year old male with a history of bilateral renal calculi and bilateral flank pain. After discussing treatment options, they decided proceed with bilateral ureteroscopic stone manipulation.  Procedure her in detail: The patient was brought to the operating room and a brief timeout was done to ensure correct patient, correct procedure, correct site.  General anesthesia was administered patient was placed in dorsal lithotomy position.  Her genitalia was then prepped and draped in usual sterile fashion.  A rigid 44 French cystoscope was passed in the urethra and the bladder.  Bladder was inspected free masses or lesions.  the ureteral orifices were in the normal orthotopic locations. a 6 french ureteral catheter was then instilled into the left ureteral orifice.  a gentle retrograde was obtained and findings noted above. We then advanced a zipwire through the catheter and up to the renal pelvis.  we then removed the cystoscope and cannulated the left ureteral orifice with a semirigid ureteroscope.  We located no stone in the ureter. We then placed a sensor wire up to the renal pelvis. We removed the scope and advanced a 12/14 x 38cm access sheath up to the renal pelvis. We then used the flexible ureteroscope to  perform nephroscopy. We located calculi in the mid and lower poles which were removed with an NGage basket. Once the stone were removed we then removed the access sheath under direct vision and noted to injury to the ureter.  We then placed a 6 x 26 double-j ureteral stent over the original zip wire. We then removed the wire and good coil was noted in the the renal pelvis under fluoroscopy and the bladder under direct vision.  We then turned out attention to the right side. a 6 french ureteral catheter was then instilled into the right ureteral orifice.  a gentle retrograde was obtained and findings noted above.We then advanced a zipwire through the catheter and up to the renal pelvis.  we then removed the cystoscope and cannulated the right ureteral orifice with a semirigid ureteroscope.  We located no stone in the ureter. We then placed a sensor wire up to the renal pelvis. We removed the scope and advanced a 12/14 x 38cm access sheath up to the renal pelvis. We then used the flexible ureteroscope to perform nephroscopy. We located calculi int he mid and lower poles which were removed with an NGage basket. Once the stone were removed we then removed the access sheath under direct vision and noted to injury to the ureter. we then placed a 6 x 26 double-j ureteral stent over the original zip wire.  We then removed the wire and good coil was noted in the the renal pelvis under fluoroscopy and the bladder under direct vision.   the bladder was then drained and this concluded the procedure which was well tolerated by patient.  Complications: None  Condition: Stable, extubated, transferred to PACU  Plan: Patient is to  be discharged home as to follow-up in 1 week for stent removal.

## 2017-03-09 ENCOUNTER — Telehealth: Payer: Self-pay | Admitting: Family Medicine

## 2017-03-09 DIAGNOSIS — R278 Other lack of coordination: Secondary | ICD-10-CM | POA: Diagnosis not present

## 2017-03-09 DIAGNOSIS — M6281 Muscle weakness (generalized): Secondary | ICD-10-CM | POA: Diagnosis not present

## 2017-03-09 DIAGNOSIS — G609 Hereditary and idiopathic neuropathy, unspecified: Secondary | ICD-10-CM | POA: Diagnosis not present

## 2017-03-09 NOTE — Telephone Encounter (Signed)
Apt made for 04/12/17 at 9:00am for f/u RCI and elevated BS. Pt knows to be fasting.

## 2017-03-09 NOTE — Telephone Encounter (Signed)
Pls call pt and ask him to make an appt with me at his earliest convenience to discuss elevated sugar on recent testing by Dr. Festus Holts needs further evaluation for diabetes.  If he could come in fasting that would be ideal.-thx

## 2017-03-10 DIAGNOSIS — N200 Calculus of kidney: Secondary | ICD-10-CM | POA: Diagnosis not present

## 2017-03-13 DIAGNOSIS — M6281 Muscle weakness (generalized): Secondary | ICD-10-CM | POA: Diagnosis not present

## 2017-03-13 DIAGNOSIS — R278 Other lack of coordination: Secondary | ICD-10-CM | POA: Diagnosis not present

## 2017-03-13 DIAGNOSIS — G609 Hereditary and idiopathic neuropathy, unspecified: Secondary | ICD-10-CM | POA: Diagnosis not present

## 2017-03-14 ENCOUNTER — Encounter: Payer: Self-pay | Admitting: Physical Medicine & Rehabilitation

## 2017-03-14 ENCOUNTER — Encounter: Payer: Medicare Other | Attending: Physical Medicine & Rehabilitation | Admitting: Physical Medicine & Rehabilitation

## 2017-03-14 VITALS — BP 115/78 | HR 85

## 2017-03-14 DIAGNOSIS — R739 Hyperglycemia, unspecified: Secondary | ICD-10-CM | POA: Diagnosis not present

## 2017-03-14 DIAGNOSIS — M545 Low back pain: Secondary | ICD-10-CM | POA: Diagnosis not present

## 2017-03-14 DIAGNOSIS — R5381 Other malaise: Secondary | ICD-10-CM

## 2017-03-14 DIAGNOSIS — G8929 Other chronic pain: Secondary | ICD-10-CM | POA: Diagnosis not present

## 2017-03-14 DIAGNOSIS — Z87891 Personal history of nicotine dependence: Secondary | ICD-10-CM | POA: Diagnosis not present

## 2017-03-14 DIAGNOSIS — M4316 Spondylolisthesis, lumbar region: Secondary | ICD-10-CM | POA: Diagnosis not present

## 2017-03-14 DIAGNOSIS — R278 Other lack of coordination: Secondary | ICD-10-CM | POA: Diagnosis not present

## 2017-03-14 DIAGNOSIS — E559 Vitamin D deficiency, unspecified: Secondary | ICD-10-CM | POA: Diagnosis not present

## 2017-03-14 DIAGNOSIS — M21371 Foot drop, right foot: Secondary | ICD-10-CM | POA: Insufficient documentation

## 2017-03-14 DIAGNOSIS — R2689 Other abnormalities of gait and mobility: Secondary | ICD-10-CM | POA: Insufficient documentation

## 2017-03-14 DIAGNOSIS — Z87442 Personal history of urinary calculi: Secondary | ICD-10-CM | POA: Insufficient documentation

## 2017-03-14 DIAGNOSIS — R531 Weakness: Secondary | ICD-10-CM | POA: Diagnosis not present

## 2017-03-14 DIAGNOSIS — M109 Gout, unspecified: Secondary | ICD-10-CM | POA: Insufficient documentation

## 2017-03-14 DIAGNOSIS — K219 Gastro-esophageal reflux disease without esophagitis: Secondary | ICD-10-CM | POA: Insufficient documentation

## 2017-03-14 DIAGNOSIS — G5600 Carpal tunnel syndrome, unspecified upper limb: Secondary | ICD-10-CM | POA: Diagnosis not present

## 2017-03-14 DIAGNOSIS — R7303 Prediabetes: Secondary | ICD-10-CM | POA: Insufficient documentation

## 2017-03-14 DIAGNOSIS — I10 Essential (primary) hypertension: Secondary | ICD-10-CM | POA: Insufficient documentation

## 2017-03-14 DIAGNOSIS — M4802 Spinal stenosis, cervical region: Secondary | ICD-10-CM | POA: Insufficient documentation

## 2017-03-14 DIAGNOSIS — R600 Localized edema: Secondary | ICD-10-CM | POA: Insufficient documentation

## 2017-03-14 DIAGNOSIS — M6281 Muscle weakness (generalized): Secondary | ICD-10-CM | POA: Diagnosis not present

## 2017-03-14 DIAGNOSIS — M21372 Foot drop, left foot: Secondary | ICD-10-CM | POA: Insufficient documentation

## 2017-03-14 DIAGNOSIS — M419 Scoliosis, unspecified: Secondary | ICD-10-CM | POA: Diagnosis not present

## 2017-03-14 DIAGNOSIS — M5416 Radiculopathy, lumbar region: Secondary | ICD-10-CM | POA: Diagnosis not present

## 2017-03-14 DIAGNOSIS — G609 Hereditary and idiopathic neuropathy, unspecified: Secondary | ICD-10-CM | POA: Diagnosis not present

## 2017-03-14 NOTE — Progress Notes (Addendum)
Subjective:    Patient ID: Ethan Rose, male    DOB: 08-11-1946, 71 y.o.   MRN: [id number]  HPI   Mr. Ethan Rose is here in follow up of his chronic gait deficits. He has had further problems with renal calculi requiring extraction and antibiotic therapy. The complications have made him generally weaker. He is not able to stand and transfer as well as before.   Home health therapy is coming out to the house and working on simple transfers and leg strengthening.   He has also been diagnosed with hyperglycemia and is primary is now following this. He's making appropriate changes to his diet.  [name] has noticed since being home that he has had more swelling. It doesn't always resolve after he sleeps, his right leg seems more affected than the left. There is no associated pain or warmth.          Pain Inventory Average Pain 0 Pain Right Now 0 My pain is na  In the last 24 hours, has pain interfered with the following? General activity 0 Relation with others 0 Enjoyment of life 0 What TIME of day is your pain at its worst? na Sleep (in general) Fair  Pain is worse with: na Pain improves with: na Relief from Meds: na  Mobility ability to climb steps?  no do you drive?  no use a wheelchair transfers alone  Function retired  Neuro/Psych weakness trouble walking  Prior Studies Any changes since last visit?  no  Physicians involved in your care Any changes since last visit?  no   Family History  Problem Relation Age of Onset  . Stroke Mother   . Diabetes Mother   . Heart disease Mother   . Hypertension Mother   . Heart attack Mother   . Stroke Father   . Heart disease Father   . Diabetes Brother   . Hypertension Brother    Social History   Social History  . Marital status: Widowed    Spouse name: N/A  . Number of children: N/A  . Years of education: N/A   Social History Main Topics  . Smoking status: Former Smoker    Packs/day: 1.00    Years:  50.00    Types: Cigarettes    Quit date: 02/12/2014  . Smokeless tobacco: Never Used  . Alcohol use 4.8 oz/week    8 Cans of beer per week     Comment: 1-2 beers q day   . Drug use: No  . Sexual activity: No   Other Topics Concern  . Not on file   Social History Narrative   Widower as of 48 (wife Ethan Rose d. MI), has one daughter and one grandson.   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   Orig from [place].  Has lived in [place] x 25 yrs.   Tobacco 50 pack-yr hx--Quit 02/2014.     Alcohol: 2 beers per day avg.  No drugs.   Exercise: none.  Has active job/physical labor.   Admitted to [facility] 03/2016   FULL CODE   Past Surgical History:  Procedure Laterality Date  . ABDOMINAL EXPOSURE N/A 03/24/2015   Procedure: ABDOMINAL EXPOSURE;  Surgeon: [name], MD;  Location: [facility] NEURO ORS;  Service: Vascular;  Laterality: N/A;  left side approach  . ANTERIOR CERVICAL DECOMP/DISCECTOMY FUSION N/A 03/18/2016   Procedure: Cervical four-five Cervical five-six Cervical six-seven Anterior cervical decompression/diskectomy/fusion;  Surgeon: [name], MD;  Location: [place] NEURO ORS;  Service: Neurosurgery;  Laterality: N/A;  n  . ANTERIOR LAT LUMBAR FUSION Right 03/24/2015   Procedure: Right Lumbar two-Three,Lumbar Three-Four,Lumbar Four-Five Anterior lateral lumbar interbody fusion ;  Surgeon: [name], MD;  Location: [place] NEURO ORS;  Service: Neurosurgery;  Laterality: Right;  right  . COLONOSCOPY  approx 2011   Dr. [name] (normal per pt report)  . CYSTOSCOPY WITH RETROGRADE PYELOGRAM, URETEROSCOPY AND STENT PLACEMENT Left 08/15/2016   Procedure: CYSTOSCOPY WITH STENT REMOVAL,  RETROGRADES, URETEROSCOPY, STONE BASKETRY AND STENT REPLACEMENT;  Surgeon: [name], MD;  Location: [facility] ORS;  Service: Urology;  Laterality: Left;  . CYSTOSCOPY WITH STENT PLACEMENT Left 07/16/2016   Procedure: CYSTOSCOPY, RETROGRADE WITH LEFT URETERAL STENT PLACEMENT;  Surgeon: [name], MD;   Location: [facility] ORS;  Service: Urology;  Laterality: Left;  . CYSTOSCOPY WITH STENT PLACEMENT Bilateral 02/27/2017   Procedure: CYSTOSCOPY WITH STENT PLACEMENT;  Surgeon: [name], MD;  Location: [facility];  Service: Urology;  Laterality: Bilateral;  . CYSTOSCOPY/RETROGRADE/URETEROSCOPY/STONE EXTRACTION WITH BASKET Bilateral 02/27/2017   Procedure: CYSTOSCOPY/RETROGRADE/URETEROSCOPY/STONE EXTRACTION WITH BASKET;  Surgeon: [name], MD;  Location: [facility];  Service: Urology;  Laterality: Bilateral;  . HOLMIUM LASER APPLICATION Left 25/02/2705   Procedure: HOLMIUM LASER;  Surgeon: [name], MD;  Location: [facility] ORS;  Service: Urology;  Laterality: Left;  . INGUINAL HERNIA REPAIR Bilateral 1993  . POSTERIOR LUMBAR FUSION 4 LEVEL N/A 03/27/2015   Procedure: Posterior pedicle subtraction osteotomy with T10 to Iliac fusion with Dr. [name] assisting;  Surgeon: [name], MD;  Location[facility]O [facility]S;  Service: Neurosurgery;  Laterality: N/A;  Posterior pedicle subtraction osteotomy with T10 to Iliac fusion with Dr. [name] assisting  . TONSILLECTOMY  1954   Past Medical History:  Diagnosis Date  . Allergic rhinitis   . At high risk for falls   . Bilateral leg weakness    uses wheelchair  . BPH with obstruction/lower urinary tract symptoms   . BPPV (benign paroxysmal positional vertigo) 03/30/2012  . Carpal tunnel syndrome   . Cervical myelopathy ([place])   . Chronic low back pain   . Degenerative spinal arthritis   . Essential hypertension   . Foot drop, bilateral    multiple back sx's  (does use AFO's , but does have them)  . GERD (gastroesophageal reflux disease)   . History of cardiac murmur as a child   . History of gout    left hand  . History of kidney stones    since 1997  . History of sepsis    09/ 2017 urosepsis  . History of squamous cell carcinoma excision    right thigh 03/ 2016  . Inability to walk    since  sepsis 09/ 2017 per pt left him severely debilitated due to weakness  . Olecranon bursitis October 10, 202017   Dr. Percell Miller  . Prediabetes 2015   HbA1c 6.4%: pt saw nutritionist  . Renal calculi    bilateral   . Slow transit constipation   . Spondylolisthesis of lumbar region   . Uses wheelchair    [phone] septic shock -- residual bilateral leg weakness w/ inability to walk  . Vitamin D deficiency    There were no vitals taken for this visit.  Opioid Risk Score:   Fall Risk Score:  `1  Depression screen PHQ 2/9  Depression screen PHQ 2/9 08/23/2016  Decreased Interest 0  Down, Depressed, Hopeless 0  PHQ - 2 Score 0  Review of Systems  Constitutional: Negative.   HENT: Negative.   Eyes: Negative.   Respiratory: Negative.   Cardiovascular: Negative.   Gastrointestinal: Negative.   Endocrine: Negative.   Genitourinary: Negative.   Musculoskeletal: Positive for joint swelling.  Skin: Negative.   Allergic/Immunologic: Negative.   Neurological: Negative.   Hematological: Negative.   Psychiatric/Behavioral: Negative.   All other systems reviewed and are negative.      Objective:   Physical Exam  Constitutional: He appears well-developedand well-nourished.  HENT: NCAT Card: RRR Respiratory: normal effort.  GI: Soft. Nontender. Genitourinary: n/a Musculoskeletal: He exhibits 1+ bilateral LE edema   -golf ball sized fluid collection left olecranon Neurological: He is alertand oriented.  Motor 4+/5 proximal to distal in the UE's.  LLE: HF 2 to /5, KE 2-/5, KE 1+/5, APF 2+/5, ADF remains trace to 1. RLE: HF 2, KE 2+ to 4, KF 1+/5,  APF 2+5 ADF 1/5 generally a bit weaker from before. I did not attempt standing with him today due to increased weakness. Skin: Skin is warmand dry.  Psychiatric: He has a normal mood and affect. His behavior is normal. Judgmentand thought contentnormal.      Assessment & Plan:  Medical Problem List and Plan: 1. Mobility and  functional deficitssecondary to debility, chronic radiculopathy/myelopathy             -continue HH therapies as tolerated.  2. Lower extremity edema: continue elevation  -TEDS  -increase physical activity    3. Bilateral foot drop: appears to be related to his lumbar Scoliosis/stenosis/radiculopathy although records are a bit inconsistent in reference to his bilateral foot drop. He does have a history of cervical stenosis, myelopathy, and subsequent surgery as well which relates closely as well - 4. Left olecranon bursitis--- -resolved   Follow up in 54months. 15 minutes of face to face patient care time were spent during this visit. All questions were encouraged and answered. Greater than 50% of time during this encounter was spent counseling patient/family in regard to his edema, strength, rehab plan. Marland Kitchen

## 2017-03-14 NOTE — Patient Instructions (Signed)
PLEASE FEEL FREE TO CALL OUR OFFICE WITH ANY PROBLEMS OR QUESTIONS ([phone])      

## 2017-03-15 ENCOUNTER — Ambulatory Visit (INDEPENDENT_AMBULATORY_CARE_PROVIDER_SITE_OTHER): Payer: Medicare Other | Admitting: Family Medicine

## 2017-03-15 ENCOUNTER — Encounter: Payer: Self-pay | Admitting: Family Medicine

## 2017-03-15 VITALS — BP 104/70 | HR 84 | Temp 97.7°F | Resp 16

## 2017-03-15 DIAGNOSIS — R531 Weakness: Secondary | ICD-10-CM | POA: Diagnosis not present

## 2017-03-15 DIAGNOSIS — M6281 Muscle weakness (generalized): Secondary | ICD-10-CM | POA: Diagnosis not present

## 2017-03-15 DIAGNOSIS — G609 Hereditary and idiopathic neuropathy, unspecified: Secondary | ICD-10-CM | POA: Diagnosis not present

## 2017-03-15 DIAGNOSIS — E441 Mild protein-calorie malnutrition: Secondary | ICD-10-CM

## 2017-03-15 DIAGNOSIS — I872 Venous insufficiency (chronic) (peripheral): Secondary | ICD-10-CM

## 2017-03-15 DIAGNOSIS — Z1159 Encounter for screening for other viral diseases: Secondary | ICD-10-CM

## 2017-03-15 DIAGNOSIS — R278 Other lack of coordination: Secondary | ICD-10-CM | POA: Diagnosis not present

## 2017-03-15 DIAGNOSIS — R739 Hyperglycemia, unspecified: Secondary | ICD-10-CM | POA: Diagnosis not present

## 2017-03-15 LAB — VITAMIN D 25 HYDROXY (VIT D DEFICIENCY, FRACTURES): VITD: 60.48 ng/mL (ref 30.00–100.00)

## 2017-03-15 LAB — BASIC METABOLIC PANEL
BUN: 16 mg/dL (ref 6–23)
CALCIUM: 9 mg/dL (ref 8.4–10.5)
CO2: 28 mEq/L (ref 19–32)
CREATININE: 0.39 mg/dL — AB (ref 0.40–1.50)
Chloride: 99 mEq/L (ref 96–112)
GFR: 232.1 mL/min (ref >60.00)
GLUCOSE: 133 mg/dL — AB (ref 70–99)
Potassium: 4 mEq/L (ref 3.5–5.1)
SODIUM: 137 meq/L (ref 135–145)

## 2017-03-15 LAB — IRON AND TIBC
%SAT: 28 % (ref 15–60)
Iron: 81 ug/dL (ref 50–180)
TIBC: 285 ug/dL (ref 250–425)
UIBC: 204 ug/dL (ref 125–400)

## 2017-03-15 LAB — FERRITIN: Ferritin: 180.6 ng/mL (ref 22.0–322.0)

## 2017-03-15 LAB — LIPID PANEL
Cholesterol: 186 mg/dL (ref 0–200)
HDL: 37.7 mg/dL — AB (ref >39.00)
LDL Cholesterol: 127 mg/dL — ABNORMAL HIGH (ref 0–99)
NONHDL: 147.84
Total CHOL/HDL Ratio: 5
Triglycerides: 104 mg/dL (ref 0.0–149.0)
VLDL: 20.8 mg/dL (ref 0.0–40.0)

## 2017-03-15 LAB — FOLATE: Folate: >24 ng/mL (ref >5.9)

## 2017-03-15 LAB — IRON: IRON: 81 ug/dL (ref 42–165)

## 2017-03-15 LAB — VITAMIN B12: Vitamin B-12: >1500 pg/mL — ABNORMAL HIGH (ref 211–911)

## 2017-03-15 NOTE — Progress Notes (Signed)
OFFICE VISIT  03/15/2017   CC:  Chief Complaint  Patient presents with  . Foot Swelling    bilateral foot edema x 3 weeks / elevated glucose     HPI:    Patient is a 71 y.o. Caucasian male who presents for discussion of recent elevated glucose measurements when labs were done by his urologist. He has hx of prediabetes dx'd in 2015, saw nutritionist at that time.  Glucose 03/06/17 was 208--random.  A fasting glucose in hosp (kidney stones/ureteral stent procedure) was 150 or so.  Feels polydipsia and polyuria.  Some cloudy/blurry vision lately.    Also c/o bilat LL swelling: stays in [facility] all the time, notes chronic LE swelling lately, goes down hs/sleep. However, last few weeks the swelling is not going down hs/sleep. No change in diet, no new med that could be contributing.  Home bp monitoring: normal or low bp at home.  Says he feels no sx's of low bp when bp is lower.  Diet; has already started limiting excessive carb intake.  In fact, he feels like his diet has been insufficient lately.  No n/v/d. No abd pains.  He is worried about whether or not he has any deficiency of vitamins or iron that may be contributing to his generalized weakness.    Past Medical History:  Diagnosis Date  . Allergic rhinitis   . At high risk for falls   . Bilateral leg weakness    uses wheelchair  . BPH with obstruction/lower urinary tract symptoms   . BPPV (benign paroxysmal positional vertigo) 03/30/2012  . Carpal tunnel syndrome   . Cervical myelopathy ([place])   . Chronic low back pain   . Degenerative spinal arthritis   . Essential hypertension   . Foot drop, bilateral    multiple back sx's  (does use AFO's , but does have them)  . GERD (gastroesophageal reflux disease)   . History of cardiac murmur as a child   . History of gout    left hand  . History of kidney stones    since 1997  . History of sepsis    09/ 2017 urosepsis  . History of squamous cell carcinoma excision    right thigh  03/ 2016  . Inability to walk    since sepsis 09/ 2017 per pt left him severely debilitated due to weakness  . Olecranon bursitis 04/16/2016   Dr. Percell Miller  . Prediabetes 2015   HbA1c 6.4%: pt saw nutritionist  . Renal calculi    bilateral   . Slow transit constipation   . Spondylolisthesis of lumbar region   . Uses wheelchair    [phone] septic shock -- residual bilateral leg weakness w/ inability to walk  . Vitamin D deficiency     Past Surgical History:  Procedure Laterality Date  . ABDOMINAL EXPOSURE N/A 03/24/2015   Procedure: ABDOMINAL EXPOSURE;  Surgeon: [name], MD;  Location: [facility];  Service: Vascular;  Laterality: N/A;  left side approach  . ANTERIOR CERVICAL DECOMP/DISCECTOMY FUSION N/A 03/18/2016   Procedure: Cervical four-five Cervical five-six Cervical six-seven Anterior cervical decompression/diskectomy/fusion;  Surgeon: [name], MD;  Location: [facility];  Service: Neurosurgery;  Laterality: N/A;  n  . ANTERIOR LAT LUMBAR FUSION Right 03/24/2015   Procedure: Right Lumbar two-Three,Lumbar Three-Four,Lumbar Four-Five Anterior lateral lumbar interbody fusion ;  Surgeon: [name], MD;  Location: [facility];  Service: Neurosurgery;  Laterality: Right;  right  . COLONOSCOPY  approx 2011  Dr. [name] (normal per pt report)  . CYSTOSCOPY WITH RETROGRADE PYELOGRAM, URETEROSCOPY AND STENT PLACEMENT Left 08/15/2016   Procedure: CYSTOSCOPY WITH STENT REMOVAL,  RETROGRADES, URETEROSCOPY, STONE BASKETRY AND STENT REPLACEMENT;  Surgeon: [name], MD;  Location: [facility];  Service: Urology;  Laterality: Left;  . CYSTOSCOPY WITH STENT PLACEMENT Left 07/16/2016   Procedure: CYSTOSCOPY, RETROGRADE WITH LEFT URETERAL STENT PLACEMENT;  Surgeon: [name], MD;  Location: [facility];  Service: Urology;  Laterality: Left;  . CYSTOSCOPY WITH STENT PLACEMENT Bilateral 02/27/2017   Procedure: CYSTOSCOPY WITH STENT PLACEMENT;  Surgeon: [name], MD;   Location: [facility];  Service: Urology;  Laterality: Bilateral;  . CYSTOSCOPY/RETROGRADE/URETEROSCOPY/STONE EXTRACTION WITH BASKET Bilateral 02/27/2017   Procedure: CYSTOSCOPY/RETROGRADE/URETEROSCOPY/STONE EXTRACTION WITH BASKET;  Surgeon: [name], MD;  Location: [facility];  Service: Urology;  Laterality: Bilateral;  . HOLMIUM LASER APPLICATION Left 34/05/4258   Procedure: HOLMIUM LASER;  Surgeon: [name], MD;  Location: [facility];  Service: Urology;  Laterality: Left;  . INGUINAL HERNIA REPAIR Bilateral 1993  . POSTERIOR LUMBAR FUSION 4 LEVEL N/A 03/27/2015   Procedure: Posterior pedicle subtraction osteotomy with T10 to Iliac fusion with Dr. [name] assisting;  Surgeon: [name], MD;  Location: [facility];  Service: Neurosurgery;  Laterality: N/A;  Posterior pedicle subtraction osteotomy with T10 to Iliac fusion with Dr. [name] assisting  . TONSILLECTOMY  1954    Outpatient Medications Prior to Visit  Medication Sig Dispense Refill  . alfuzosin (UROXATRAL) 10 MG 24 hr tablet Take 10 mg by mouth at bedtime.     . Cholecalciferol (VITAMIN D3) 5000 units CAPS Take 1 capsule by mouth daily.    . Menaquinone-7 (VITAMIN K2 PO) Take 1 tablet by mouth daily.     . Multiple Vitamins-Minerals (MULTIVITAMIN WITH MINERALS) tablet Take 1 tablet by mouth daily.    . nitrofurantoin (MACRODANTIN) 100 MG capsule Take 100 mg by mouth 2 (two) times daily.    Marland Kitchen OVER THE COUNTER MEDICATION Take 1 Dose by mouth daily. Over the counter OPC3 and Isochrome powders 1 capful of both mixed in 4 ounces of water daily    . OVER THE COUNTER MEDICATION Take 1 capsule by mouth daily. neuroquell    . OVER THE COUNTER MEDICATION Take 1 tablet by mouth 2 (two) times daily. Public relations account executive    . OVER THE COUNTER MEDICATION Take 1 capsule by mouth daily. VITAL ARTERY CLEANSE    . Potassium Citrate (UROCIT-K 15) 15 MEQ (1620 MG) TBCR Take 1 tablet by mouth 2 (two)  times daily.    . Probiotic Product (PROBIOTIC PO) Take 50 mg by mouth daily.     No facility-administered medications prior to visit.     No Known Allergies  ROS As per HPI  PE: Blood pressure 104/70, pulse 84, temperature 97.7 F (36.5 C), temperature source Oral, resp. rate 16, SpO2 98 %. Gen: Alert, well appearing.  Patient is oriented to person, place, time, and situation. AFFECT: pleasant, lucid thought and speech. EXT: no clubbing or cyanosis.  He has 2+ pitting edema from mid tibia into ankles/feet bilat. Some anterior hemosiderin skin changes as well as mild pinkish hue.  No erythema or tenderness or skin breakdown.  LABS:  Lab Results  Component Value Date   TSH 1.52 04/16/2014   Lab Results  Component Value Date   WBC 15.2 03/06/2017   HGB 12.5 (A) 03/06/2017   HCT 37 (A) 03/06/2017  MCV 88.4 08/04/2016   PLT 376 03/06/2017   Lab Results  Component Value Date   CREATININE 0.4 (A) 03/06/2017   BUN 22 (A) 03/06/2017   NA 135 (A) 03/06/2017   K 4.9 03/06/2017   CL 98 (L) 02/27/2017   CO2 26 10/26/2016   Lab Results  Component Value Date   ALT 30 07/23/2016   AST 24 07/23/2016   ALKPHOS 94 07/23/2016   BILITOT 0.6 07/23/2016   Lab Results  Component Value Date   CHOL 183 04/16/2014   Lab Results  Component Value Date   HDL 47.10 04/16/2014   Lab Results  Component Value Date   LDLCALC 123 (H) 04/16/2014   Lab Results  Component Value Date   TRIG 66.0 04/16/2014   Lab Results  Component Value Date   CHOLHDL 4 04/16/2014   Lab Results  Component Value Date   PSA 0.69 04/16/2014   PSA 0.49 04/29/2013   Lab Results  Component Value Date   HGBA1C 6.2 (H) 07/17/2016   Albumin 07/2016 was low at 2.3.  IMPRESSION AND PLAN:  1) Hyperglycemia: pretty much fits criteria for new dx DM 2 (random glucose >200 + symptoms, also one fasting gluc 150s range, hx of prediabetes/A1c of 6.4% in the past.  Will check fasting glucose again today as well  as HbA1c. Discussed diabetic diet today.  Nutritionist referral recommended but pt declined due to excessive difficulty getting out of home due to his chronic diffuse/generalized weakness, esp in legs (wheelchair bound). Discussed possibility of needing to start oral diabetic med and pt is open to this if needed.  2) Chronic LE venous insufficiency bilat: worsened due to persistent sitting position in WC--I think this has now led to some venous valvular insufficiency.  He also has calf muscle wasting due to lack of activity related to his LE weakness (spinal stenosis).  Has hx of low bp so no diuretic unless this edema becomes severe or leads to complications. Encouraged continuation of low Na diet, elevation of legs above level of heart, and I wrote rx for compression hose today.  3) Mild protein calorie malnutrition/generalized weakness: will check iron labs as well as vit B12, vit D, and folate. Additionally, pt asks for hep c screening so this was ordered today.  An After Visit Summary was printed and given to the patient.  FOLLOW UP: Return in about 3 months (around 06/15/2017) for routine chronic illness f/u.  Signed:  Crissie Sickles, MD           03/15/2017

## 2017-03-15 NOTE — Patient Instructions (Signed)
Diabetes Mellitus and Food It is important for you to manage your blood sugar (glucose) level. Your blood glucose level can be greatly affected by what you eat. Eating healthier foods in the appropriate amounts throughout the day at about the same time each day will help you control your blood glucose level. It can also help slow or prevent worsening of your diabetes mellitus. Healthy eating may even help you improve the level of your blood pressure and reach or maintain a healthy weight. General recommendations for healthful eating and cooking habits include:  Eating meals and snacks regularly. Avoid going long periods of time without eating to lose weight.  Eating a diet that consists mainly of plant-based foods, such as fruits, vegetables, nuts, legumes, and whole grains.  Using low-heat cooking methods, such as baking, instead of high-heat cooking methods, such as deep frying.  Work with your dietitian to make sure you understand how to use the Nutrition Facts information on food labels. How can food affect me? Carbohydrates Carbohydrates affect your blood glucose level more than any other type of food. Your dietitian will help you determine how many carbohydrates to eat at each meal and teach you how to count carbohydrates. Counting carbohydrates is important to keep your blood glucose at a healthy level, especially if you are using insulin or taking certain medicines for diabetes mellitus. Alcohol Alcohol can cause sudden decreases in blood glucose (hypoglycemia), especially if you use insulin or take certain medicines for diabetes mellitus. Hypoglycemia can be a life-threatening condition. Symptoms of hypoglycemia (sleepiness, dizziness, and disorientation) are similar to symptoms of having too much alcohol. If your health care provider has given you approval to drink alcohol, do so in moderation and use the following guidelines:  Women should not have more than one drink per day, and men  should not have more than two drinks per day. One drink is equal to: ? 12 oz of beer. ? 5 oz of wine. ? 1 oz of hard liquor.  Do not drink on an empty stomach.  Keep yourself hydrated. Have water, diet soda, or unsweetened iced tea.  Regular soda, juice, and other mixers might contain a lot of carbohydrates and should be counted.  What foods are not recommended? As you make food choices, it is important to remember that all foods are not the same. Some foods have fewer nutrients per serving than other foods, even though they might have the same number of calories or carbohydrates. It is difficult to get your body what it needs when you eat foods with fewer nutrients. Examples of foods that you should avoid that are high in calories and carbohydrates but low in nutrients include:  Trans fats (most processed foods list trans fats on the Nutrition Facts label).  Regular soda.  Juice.  Candy.  Sweets, such as cake, pie, doughnuts, and cookies.  Fried foods.  What foods can I eat? Eat nutrient-rich foods, which will nourish your body and keep you healthy. The food you should eat also will depend on several factors, including:  The calories you need.  The medicines you take.  Your weight.  Your blood glucose level.  Your blood pressure level.  Your cholesterol level.  You should eat a variety of foods, including:  Protein. ? Lean cuts of meat. ? Proteins low in saturated fats, such as fish, egg whites, and beans. Avoid processed meats.  Fruits and vegetables. ? Fruits and vegetables that may help control blood glucose levels, such as apples,   mangoes, and yams.  Dairy products. ? Choose fat-free or low-fat dairy products, such as milk, yogurt, and cheese.  Grains, bread, pasta, and rice. ? Choose whole grain products, such as multigrain bread, whole oats, and brown rice. These foods may help control blood pressure.  Fats. ? Foods containing healthful fats, such as  nuts, avocado, olive oil, canola oil, and fish.  Does everyone with diabetes mellitus have the same meal plan? Because every person with diabetes mellitus is different, there is not one meal plan that works for everyone. It is very important that you meet with a dietitian who will help you create a meal plan that is just right for you. This information is not intended to replace advice given to you by your health care provider. Make sure you discuss any questions you have with your health care provider. Document Released: 07/28/2005 Document Revised: 04/07/2016 Document Reviewed: 09/27/2013 Elsevier Interactive Patient Education  2017 [employer] Inc.  

## 2017-03-16 ENCOUNTER — Encounter: Payer: Self-pay | Admitting: Family Medicine

## 2017-03-16 ENCOUNTER — Other Ambulatory Visit: Payer: Self-pay | Admitting: Family Medicine

## 2017-03-16 DIAGNOSIS — M6281 Muscle weakness (generalized): Secondary | ICD-10-CM | POA: Diagnosis not present

## 2017-03-16 DIAGNOSIS — E119 Type 2 diabetes mellitus without complications: Secondary | ICD-10-CM

## 2017-03-16 DIAGNOSIS — G609 Hereditary and idiopathic neuropathy, unspecified: Secondary | ICD-10-CM | POA: Diagnosis not present

## 2017-03-16 DIAGNOSIS — R278 Other lack of coordination: Secondary | ICD-10-CM | POA: Diagnosis not present

## 2017-03-16 LAB — HEPATITIS C ANTIBODY: HCV Ab: NEGATIVE

## 2017-03-17 DIAGNOSIS — G609 Hereditary and idiopathic neuropathy, unspecified: Secondary | ICD-10-CM | POA: Diagnosis not present

## 2017-03-17 DIAGNOSIS — R278 Other lack of coordination: Secondary | ICD-10-CM | POA: Diagnosis not present

## 2017-03-17 DIAGNOSIS — M6281 Muscle weakness (generalized): Secondary | ICD-10-CM | POA: Diagnosis not present

## 2017-03-20 DIAGNOSIS — R278 Other lack of coordination: Secondary | ICD-10-CM | POA: Diagnosis not present

## 2017-03-20 DIAGNOSIS — M6281 Muscle weakness (generalized): Secondary | ICD-10-CM | POA: Diagnosis not present

## 2017-03-20 DIAGNOSIS — G609 Hereditary and idiopathic neuropathy, unspecified: Secondary | ICD-10-CM | POA: Diagnosis not present

## 2017-03-21 DIAGNOSIS — R278 Other lack of coordination: Secondary | ICD-10-CM | POA: Diagnosis not present

## 2017-03-21 DIAGNOSIS — M6281 Muscle weakness (generalized): Secondary | ICD-10-CM | POA: Diagnosis not present

## 2017-03-21 DIAGNOSIS — G609 Hereditary and idiopathic neuropathy, unspecified: Secondary | ICD-10-CM | POA: Diagnosis not present

## 2017-03-22 ENCOUNTER — Other Ambulatory Visit (INDEPENDENT_AMBULATORY_CARE_PROVIDER_SITE_OTHER): Payer: Medicare Other

## 2017-03-22 DIAGNOSIS — E119 Type 2 diabetes mellitus without complications: Secondary | ICD-10-CM

## 2017-03-22 DIAGNOSIS — M6281 Muscle weakness (generalized): Secondary | ICD-10-CM | POA: Diagnosis not present

## 2017-03-22 DIAGNOSIS — G609 Hereditary and idiopathic neuropathy, unspecified: Secondary | ICD-10-CM | POA: Diagnosis not present

## 2017-03-22 DIAGNOSIS — R278 Other lack of coordination: Secondary | ICD-10-CM | POA: Diagnosis not present

## 2017-03-22 LAB — HEMOGLOBIN A1C: HEMOGLOBIN A1C: 6.9 % — AB (ref 4.6–6.5)

## 2017-03-23 DIAGNOSIS — G609 Hereditary and idiopathic neuropathy, unspecified: Secondary | ICD-10-CM | POA: Diagnosis not present

## 2017-03-23 DIAGNOSIS — M6281 Muscle weakness (generalized): Secondary | ICD-10-CM | POA: Diagnosis not present

## 2017-03-23 DIAGNOSIS — R278 Other lack of coordination: Secondary | ICD-10-CM | POA: Diagnosis not present

## 2017-03-24 DIAGNOSIS — M6281 Muscle weakness (generalized): Secondary | ICD-10-CM | POA: Diagnosis not present

## 2017-03-24 DIAGNOSIS — R278 Other lack of coordination: Secondary | ICD-10-CM | POA: Diagnosis not present

## 2017-03-24 DIAGNOSIS — G609 Hereditary and idiopathic neuropathy, unspecified: Secondary | ICD-10-CM | POA: Diagnosis not present

## 2017-03-27 DIAGNOSIS — R278 Other lack of coordination: Secondary | ICD-10-CM | POA: Diagnosis not present

## 2017-03-27 DIAGNOSIS — M6281 Muscle weakness (generalized): Secondary | ICD-10-CM | POA: Diagnosis not present

## 2017-03-27 DIAGNOSIS — G609 Hereditary and idiopathic neuropathy, unspecified: Secondary | ICD-10-CM | POA: Diagnosis not present

## 2017-03-28 DIAGNOSIS — M6281 Muscle weakness (generalized): Secondary | ICD-10-CM | POA: Diagnosis not present

## 2017-03-28 DIAGNOSIS — R278 Other lack of coordination: Secondary | ICD-10-CM | POA: Diagnosis not present

## 2017-03-28 DIAGNOSIS — G609 Hereditary and idiopathic neuropathy, unspecified: Secondary | ICD-10-CM | POA: Diagnosis not present

## 2017-03-29 DIAGNOSIS — M6281 Muscle weakness (generalized): Secondary | ICD-10-CM | POA: Diagnosis not present

## 2017-03-29 DIAGNOSIS — R278 Other lack of coordination: Secondary | ICD-10-CM | POA: Diagnosis not present

## 2017-03-29 DIAGNOSIS — G609 Hereditary and idiopathic neuropathy, unspecified: Secondary | ICD-10-CM | POA: Diagnosis not present

## 2017-03-30 DIAGNOSIS — G609 Hereditary and idiopathic neuropathy, unspecified: Secondary | ICD-10-CM | POA: Diagnosis not present

## 2017-03-30 DIAGNOSIS — M6281 Muscle weakness (generalized): Secondary | ICD-10-CM | POA: Diagnosis not present

## 2017-03-30 DIAGNOSIS — R278 Other lack of coordination: Secondary | ICD-10-CM | POA: Diagnosis not present

## 2017-03-31 DIAGNOSIS — M6281 Muscle weakness (generalized): Secondary | ICD-10-CM | POA: Diagnosis not present

## 2017-03-31 DIAGNOSIS — G609 Hereditary and idiopathic neuropathy, unspecified: Secondary | ICD-10-CM | POA: Diagnosis not present

## 2017-03-31 DIAGNOSIS — R278 Other lack of coordination: Secondary | ICD-10-CM | POA: Diagnosis not present

## 2017-04-03 DIAGNOSIS — G609 Hereditary and idiopathic neuropathy, unspecified: Secondary | ICD-10-CM | POA: Diagnosis not present

## 2017-04-03 DIAGNOSIS — M6281 Muscle weakness (generalized): Secondary | ICD-10-CM | POA: Diagnosis not present

## 2017-04-03 DIAGNOSIS — R278 Other lack of coordination: Secondary | ICD-10-CM | POA: Diagnosis not present

## 2017-04-04 DIAGNOSIS — M6281 Muscle weakness (generalized): Secondary | ICD-10-CM | POA: Diagnosis not present

## 2017-04-04 DIAGNOSIS — R278 Other lack of coordination: Secondary | ICD-10-CM | POA: Diagnosis not present

## 2017-04-04 DIAGNOSIS — G609 Hereditary and idiopathic neuropathy, unspecified: Secondary | ICD-10-CM | POA: Diagnosis not present

## 2017-04-05 DIAGNOSIS — M6281 Muscle weakness (generalized): Secondary | ICD-10-CM | POA: Diagnosis not present

## 2017-04-05 DIAGNOSIS — R278 Other lack of coordination: Secondary | ICD-10-CM | POA: Diagnosis not present

## 2017-04-05 DIAGNOSIS — G609 Hereditary and idiopathic neuropathy, unspecified: Secondary | ICD-10-CM | POA: Diagnosis not present

## 2017-04-06 DIAGNOSIS — G609 Hereditary and idiopathic neuropathy, unspecified: Secondary | ICD-10-CM | POA: Diagnosis not present

## 2017-04-06 DIAGNOSIS — M6281 Muscle weakness (generalized): Secondary | ICD-10-CM | POA: Diagnosis not present

## 2017-04-06 DIAGNOSIS — R278 Other lack of coordination: Secondary | ICD-10-CM | POA: Diagnosis not present

## 2017-04-11 DIAGNOSIS — R278 Other lack of coordination: Secondary | ICD-10-CM | POA: Diagnosis not present

## 2017-04-11 DIAGNOSIS — M6281 Muscle weakness (generalized): Secondary | ICD-10-CM | POA: Diagnosis not present

## 2017-04-11 DIAGNOSIS — G609 Hereditary and idiopathic neuropathy, unspecified: Secondary | ICD-10-CM | POA: Diagnosis not present

## 2017-04-11 DIAGNOSIS — G729 Myopathy, unspecified: Secondary | ICD-10-CM | POA: Diagnosis not present

## 2017-04-12 ENCOUNTER — Ambulatory Visit: Payer: Medicare Other | Admitting: Family Medicine

## 2017-04-12 DIAGNOSIS — R278 Other lack of coordination: Secondary | ICD-10-CM | POA: Diagnosis not present

## 2017-04-12 DIAGNOSIS — M6281 Muscle weakness (generalized): Secondary | ICD-10-CM | POA: Diagnosis not present

## 2017-04-12 DIAGNOSIS — G609 Hereditary and idiopathic neuropathy, unspecified: Secondary | ICD-10-CM | POA: Diagnosis not present

## 2017-04-12 DIAGNOSIS — G729 Myopathy, unspecified: Secondary | ICD-10-CM | POA: Diagnosis not present

## 2017-04-13 DIAGNOSIS — M6281 Muscle weakness (generalized): Secondary | ICD-10-CM | POA: Diagnosis not present

## 2017-04-13 DIAGNOSIS — R278 Other lack of coordination: Secondary | ICD-10-CM | POA: Diagnosis not present

## 2017-04-13 DIAGNOSIS — G609 Hereditary and idiopathic neuropathy, unspecified: Secondary | ICD-10-CM | POA: Diagnosis not present

## 2017-04-13 DIAGNOSIS — G729 Myopathy, unspecified: Secondary | ICD-10-CM | POA: Diagnosis not present

## 2017-04-14 DIAGNOSIS — R278 Other lack of coordination: Secondary | ICD-10-CM | POA: Diagnosis not present

## 2017-04-14 DIAGNOSIS — G729 Myopathy, unspecified: Secondary | ICD-10-CM | POA: Diagnosis not present

## 2017-04-14 DIAGNOSIS — M6281 Muscle weakness (generalized): Secondary | ICD-10-CM | POA: Diagnosis not present

## 2017-04-14 DIAGNOSIS — G609 Hereditary and idiopathic neuropathy, unspecified: Secondary | ICD-10-CM | POA: Diagnosis not present

## 2017-04-17 DIAGNOSIS — G609 Hereditary and idiopathic neuropathy, unspecified: Secondary | ICD-10-CM | POA: Diagnosis not present

## 2017-04-17 DIAGNOSIS — M6281 Muscle weakness (generalized): Secondary | ICD-10-CM | POA: Diagnosis not present

## 2017-04-17 DIAGNOSIS — R278 Other lack of coordination: Secondary | ICD-10-CM | POA: Diagnosis not present

## 2017-04-17 DIAGNOSIS — G729 Myopathy, unspecified: Secondary | ICD-10-CM | POA: Diagnosis not present

## 2017-04-18 DIAGNOSIS — G729 Myopathy, unspecified: Secondary | ICD-10-CM | POA: Diagnosis not present

## 2017-04-18 DIAGNOSIS — M6281 Muscle weakness (generalized): Secondary | ICD-10-CM | POA: Diagnosis not present

## 2017-04-18 DIAGNOSIS — R278 Other lack of coordination: Secondary | ICD-10-CM | POA: Diagnosis not present

## 2017-04-18 DIAGNOSIS — G609 Hereditary and idiopathic neuropathy, unspecified: Secondary | ICD-10-CM | POA: Diagnosis not present

## 2017-04-19 DIAGNOSIS — G609 Hereditary and idiopathic neuropathy, unspecified: Secondary | ICD-10-CM | POA: Diagnosis not present

## 2017-04-19 DIAGNOSIS — R278 Other lack of coordination: Secondary | ICD-10-CM | POA: Diagnosis not present

## 2017-04-19 DIAGNOSIS — M6281 Muscle weakness (generalized): Secondary | ICD-10-CM | POA: Diagnosis not present

## 2017-04-19 DIAGNOSIS — G729 Myopathy, unspecified: Secondary | ICD-10-CM | POA: Diagnosis not present

## 2017-04-20 DIAGNOSIS — R278 Other lack of coordination: Secondary | ICD-10-CM | POA: Diagnosis not present

## 2017-04-20 DIAGNOSIS — G729 Myopathy, unspecified: Secondary | ICD-10-CM | POA: Diagnosis not present

## 2017-04-20 DIAGNOSIS — M6281 Muscle weakness (generalized): Secondary | ICD-10-CM | POA: Diagnosis not present

## 2017-04-20 DIAGNOSIS — G609 Hereditary and idiopathic neuropathy, unspecified: Secondary | ICD-10-CM | POA: Diagnosis not present

## 2017-04-21 DIAGNOSIS — G609 Hereditary and idiopathic neuropathy, unspecified: Secondary | ICD-10-CM | POA: Diagnosis not present

## 2017-04-21 DIAGNOSIS — G729 Myopathy, unspecified: Secondary | ICD-10-CM | POA: Diagnosis not present

## 2017-04-21 DIAGNOSIS — M6281 Muscle weakness (generalized): Secondary | ICD-10-CM | POA: Diagnosis not present

## 2017-04-21 DIAGNOSIS — R278 Other lack of coordination: Secondary | ICD-10-CM | POA: Diagnosis not present

## 2017-04-24 DIAGNOSIS — M6281 Muscle weakness (generalized): Secondary | ICD-10-CM | POA: Diagnosis not present

## 2017-04-24 DIAGNOSIS — G609 Hereditary and idiopathic neuropathy, unspecified: Secondary | ICD-10-CM | POA: Diagnosis not present

## 2017-04-24 DIAGNOSIS — G729 Myopathy, unspecified: Secondary | ICD-10-CM | POA: Diagnosis not present

## 2017-04-24 DIAGNOSIS — R278 Other lack of coordination: Secondary | ICD-10-CM | POA: Diagnosis not present

## 2017-04-25 DIAGNOSIS — M6281 Muscle weakness (generalized): Secondary | ICD-10-CM | POA: Diagnosis not present

## 2017-04-25 DIAGNOSIS — G729 Myopathy, unspecified: Secondary | ICD-10-CM | POA: Diagnosis not present

## 2017-04-25 DIAGNOSIS — G609 Hereditary and idiopathic neuropathy, unspecified: Secondary | ICD-10-CM | POA: Diagnosis not present

## 2017-04-25 DIAGNOSIS — R278 Other lack of coordination: Secondary | ICD-10-CM | POA: Diagnosis not present

## 2017-04-26 DIAGNOSIS — G729 Myopathy, unspecified: Secondary | ICD-10-CM | POA: Diagnosis not present

## 2017-04-26 DIAGNOSIS — M6281 Muscle weakness (generalized): Secondary | ICD-10-CM | POA: Diagnosis not present

## 2017-04-26 DIAGNOSIS — R278 Other lack of coordination: Secondary | ICD-10-CM | POA: Diagnosis not present

## 2017-04-26 DIAGNOSIS — G609 Hereditary and idiopathic neuropathy, unspecified: Secondary | ICD-10-CM | POA: Diagnosis not present

## 2017-04-27 DIAGNOSIS — R278 Other lack of coordination: Secondary | ICD-10-CM | POA: Diagnosis not present

## 2017-04-27 DIAGNOSIS — M6281 Muscle weakness (generalized): Secondary | ICD-10-CM | POA: Diagnosis not present

## 2017-04-27 DIAGNOSIS — G729 Myopathy, unspecified: Secondary | ICD-10-CM | POA: Diagnosis not present

## 2017-04-27 DIAGNOSIS — G609 Hereditary and idiopathic neuropathy, unspecified: Secondary | ICD-10-CM | POA: Diagnosis not present

## 2017-04-28 DIAGNOSIS — G609 Hereditary and idiopathic neuropathy, unspecified: Secondary | ICD-10-CM | POA: Diagnosis not present

## 2017-04-28 DIAGNOSIS — R278 Other lack of coordination: Secondary | ICD-10-CM | POA: Diagnosis not present

## 2017-04-28 DIAGNOSIS — G729 Myopathy, unspecified: Secondary | ICD-10-CM | POA: Diagnosis not present

## 2017-04-28 DIAGNOSIS — M6281 Muscle weakness (generalized): Secondary | ICD-10-CM | POA: Diagnosis not present

## 2017-05-01 DIAGNOSIS — M6281 Muscle weakness (generalized): Secondary | ICD-10-CM | POA: Diagnosis not present

## 2017-05-01 DIAGNOSIS — G729 Myopathy, unspecified: Secondary | ICD-10-CM | POA: Diagnosis not present

## 2017-05-01 DIAGNOSIS — R278 Other lack of coordination: Secondary | ICD-10-CM | POA: Diagnosis not present

## 2017-05-01 DIAGNOSIS — G609 Hereditary and idiopathic neuropathy, unspecified: Secondary | ICD-10-CM | POA: Diagnosis not present

## 2017-05-02 DIAGNOSIS — M6281 Muscle weakness (generalized): Secondary | ICD-10-CM | POA: Diagnosis not present

## 2017-05-02 DIAGNOSIS — G729 Myopathy, unspecified: Secondary | ICD-10-CM | POA: Diagnosis not present

## 2017-05-02 DIAGNOSIS — G609 Hereditary and idiopathic neuropathy, unspecified: Secondary | ICD-10-CM | POA: Diagnosis not present

## 2017-05-02 DIAGNOSIS — R278 Other lack of coordination: Secondary | ICD-10-CM | POA: Diagnosis not present

## 2017-05-04 DIAGNOSIS — M6281 Muscle weakness (generalized): Secondary | ICD-10-CM | POA: Diagnosis not present

## 2017-05-04 DIAGNOSIS — R278 Other lack of coordination: Secondary | ICD-10-CM | POA: Diagnosis not present

## 2017-05-04 DIAGNOSIS — G729 Myopathy, unspecified: Secondary | ICD-10-CM | POA: Diagnosis not present

## 2017-05-04 DIAGNOSIS — G609 Hereditary and idiopathic neuropathy, unspecified: Secondary | ICD-10-CM | POA: Diagnosis not present

## 2017-05-05 DIAGNOSIS — G729 Myopathy, unspecified: Secondary | ICD-10-CM | POA: Diagnosis not present

## 2017-05-05 DIAGNOSIS — M6281 Muscle weakness (generalized): Secondary | ICD-10-CM | POA: Diagnosis not present

## 2017-05-05 DIAGNOSIS — R278 Other lack of coordination: Secondary | ICD-10-CM | POA: Diagnosis not present

## 2017-05-05 DIAGNOSIS — R531 Weakness: Secondary | ICD-10-CM | POA: Diagnosis not present

## 2017-05-05 DIAGNOSIS — G609 Hereditary and idiopathic neuropathy, unspecified: Secondary | ICD-10-CM | POA: Diagnosis not present

## 2017-05-05 DIAGNOSIS — M5136 Other intervertebral disc degeneration, lumbar region: Secondary | ICD-10-CM | POA: Diagnosis not present

## 2017-05-05 DIAGNOSIS — M4802 Spinal stenosis, cervical region: Secondary | ICD-10-CM | POA: Diagnosis not present

## 2017-05-06 DIAGNOSIS — Z981 Arthrodesis status: Secondary | ICD-10-CM | POA: Diagnosis not present

## 2017-05-08 DIAGNOSIS — R278 Other lack of coordination: Secondary | ICD-10-CM | POA: Diagnosis not present

## 2017-05-08 DIAGNOSIS — G609 Hereditary and idiopathic neuropathy, unspecified: Secondary | ICD-10-CM | POA: Diagnosis not present

## 2017-05-08 DIAGNOSIS — M6281 Muscle weakness (generalized): Secondary | ICD-10-CM | POA: Diagnosis not present

## 2017-05-08 DIAGNOSIS — G729 Myopathy, unspecified: Secondary | ICD-10-CM | POA: Diagnosis not present

## 2017-05-09 DIAGNOSIS — M6281 Muscle weakness (generalized): Secondary | ICD-10-CM | POA: Diagnosis not present

## 2017-05-09 DIAGNOSIS — G729 Myopathy, unspecified: Secondary | ICD-10-CM | POA: Diagnosis not present

## 2017-05-09 DIAGNOSIS — G609 Hereditary and idiopathic neuropathy, unspecified: Secondary | ICD-10-CM | POA: Diagnosis not present

## 2017-05-09 DIAGNOSIS — R278 Other lack of coordination: Secondary | ICD-10-CM | POA: Diagnosis not present

## 2017-05-10 DIAGNOSIS — M6281 Muscle weakness (generalized): Secondary | ICD-10-CM | POA: Diagnosis not present

## 2017-05-10 DIAGNOSIS — G729 Myopathy, unspecified: Secondary | ICD-10-CM | POA: Diagnosis not present

## 2017-05-10 DIAGNOSIS — G609 Hereditary and idiopathic neuropathy, unspecified: Secondary | ICD-10-CM | POA: Diagnosis not present

## 2017-05-10 DIAGNOSIS — R278 Other lack of coordination: Secondary | ICD-10-CM | POA: Diagnosis not present

## 2017-05-11 DIAGNOSIS — R278 Other lack of coordination: Secondary | ICD-10-CM | POA: Diagnosis not present

## 2017-05-11 DIAGNOSIS — G729 Myopathy, unspecified: Secondary | ICD-10-CM | POA: Diagnosis not present

## 2017-05-11 DIAGNOSIS — M6281 Muscle weakness (generalized): Secondary | ICD-10-CM | POA: Diagnosis not present

## 2017-05-11 DIAGNOSIS — G609 Hereditary and idiopathic neuropathy, unspecified: Secondary | ICD-10-CM | POA: Diagnosis not present

## 2017-05-12 DIAGNOSIS — M6281 Muscle weakness (generalized): Secondary | ICD-10-CM | POA: Diagnosis not present

## 2017-05-12 DIAGNOSIS — G609 Hereditary and idiopathic neuropathy, unspecified: Secondary | ICD-10-CM | POA: Diagnosis not present

## 2017-05-12 DIAGNOSIS — R278 Other lack of coordination: Secondary | ICD-10-CM | POA: Diagnosis not present

## 2017-05-12 DIAGNOSIS — G729 Myopathy, unspecified: Secondary | ICD-10-CM | POA: Diagnosis not present

## 2017-05-14 DIAGNOSIS — G1221 Amyotrophic lateral sclerosis: Secondary | ICD-10-CM

## 2017-05-14 HISTORY — DX: Amyotrophic lateral sclerosis: G12.21

## 2017-05-15 DIAGNOSIS — M6281 Muscle weakness (generalized): Secondary | ICD-10-CM | POA: Diagnosis not present

## 2017-05-15 DIAGNOSIS — G609 Hereditary and idiopathic neuropathy, unspecified: Secondary | ICD-10-CM | POA: Diagnosis not present

## 2017-05-15 DIAGNOSIS — R278 Other lack of coordination: Secondary | ICD-10-CM | POA: Diagnosis not present

## 2017-05-15 DIAGNOSIS — G729 Myopathy, unspecified: Secondary | ICD-10-CM | POA: Diagnosis not present

## 2017-05-16 ENCOUNTER — Ambulatory Visit (INDEPENDENT_AMBULATORY_CARE_PROVIDER_SITE_OTHER): Payer: Medicare Other | Admitting: Sports Medicine

## 2017-05-16 ENCOUNTER — Encounter: Payer: Self-pay | Admitting: Sports Medicine

## 2017-05-16 DIAGNOSIS — M79675 Pain in left toe(s): Secondary | ICD-10-CM | POA: Diagnosis not present

## 2017-05-16 DIAGNOSIS — B351 Tinea unguium: Secondary | ICD-10-CM | POA: Diagnosis not present

## 2017-05-16 DIAGNOSIS — G609 Hereditary and idiopathic neuropathy, unspecified: Secondary | ICD-10-CM | POA: Diagnosis not present

## 2017-05-16 DIAGNOSIS — I739 Peripheral vascular disease, unspecified: Secondary | ICD-10-CM

## 2017-05-16 DIAGNOSIS — M79674 Pain in right toe(s): Secondary | ICD-10-CM | POA: Diagnosis not present

## 2017-05-16 DIAGNOSIS — L84 Corns and callosities: Secondary | ICD-10-CM

## 2017-05-16 DIAGNOSIS — G729 Myopathy, unspecified: Secondary | ICD-10-CM | POA: Diagnosis not present

## 2017-05-16 DIAGNOSIS — R278 Other lack of coordination: Secondary | ICD-10-CM | POA: Diagnosis not present

## 2017-05-16 DIAGNOSIS — M6281 Muscle weakness (generalized): Secondary | ICD-10-CM | POA: Diagnosis not present

## 2017-05-16 NOTE — Progress Notes (Signed)
Subjective: Ethan Rose is a 71 y.o. male patient seen today in office with complaint of painful thickened and elongated toenails; unable to trim. Patient admits that he is getting NCV/EMGs for weakness and has a new mass on his spine. Patient has no other pedal complaints at this time.   Patient Active Problem List   Diagnosis Date Noted  . Lumbar radiculopathy 11/29/2016  . Hypocalcemia 10/26/2016  . Ureteral stone with hydronephrosis 08/10/2016  . Debility 07/22/2016  . Pressure ulcer 07/18/2016  . E coli bacteremia   . Leukocytosis   . Pyelonephritis 07/16/2016  . Left ureteral stone 07/16/2016  . Cervical myelopathy ([place]) 03/18/2016  . 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 Other malaise and fatigue 05/14/2014  . Health maintenance examination 04/29/2013  . Weakness of both legs 04/29/2013  . Leg cramps 04/29/2013  . Elevated blood pressure reading without diagnosis of hypertension 03/30/2012  . BPPV (benign paroxysmal positional vertigo) 03/30/2012    Current Outpatient Prescriptions on File Prior to Visit  Medication Sig Dispense Refill  . alfuzosin (UROXATRAL) 10 MG 24 hr tablet Take 10 mg by mouth at bedtime.     . Cholecalciferol (VITAMIN D3) 5000 units CAPS Take 1 capsule by mouth daily.    . Menaquinone-7 (VITAMIN K2 PO) Take 1 tablet by mouth daily.     . Multiple Vitamins-Minerals (MULTIVITAMIN WITH MINERALS) tablet Take 1 tablet by mouth daily.    . nitrofurantoin (MACRODANTIN) 100 MG capsule Take 100 mg by mouth 2 (two) times daily.    Marland Kitchen OVER THE COUNTER MEDICATION Take 1 Dose by mouth daily. Over the counter OPC3 and Isochrome powders 1 capful of both mixed in 4 ounces of water daily    . OVER THE COUNTER MEDICATION Take 1 capsule by mouth daily. neuroquell    . OVER THE COUNTER MEDICATION Take 1 tablet by mouth 2 (two) times daily. Public relations account executive     . OVER THE COUNTER MEDICATION Take 1 capsule by mouth daily. VITAL ARTERY CLEANSE    . Potassium Citrate (UROCIT-K 15) 15 MEQ (1620 MG) TBCR Take 1 tablet by mouth 2 (two) times daily.    . Probiotic Product (PROBIOTIC PO) Take 50 mg by mouth daily.     No current facility-administered medications on file prior to visit.     No Known Allergies  Objective: Physical Exam  General: Well developed, nourished, no acute distress, awake, alert and oriented x 3 in wheelchair assisted by brother  Dermatological: Skin is warm, dry, and supple bilateral, Nails 2-5 bilateral are tender, long, thick, and discolored with mild subungal debris, Bilateral hallux nail procedure sites are healing well, no webspace macerations present bilateral, + pre-ulcerative lesion 2nd toes bilateral, no open lesions present bilateral. No signs of infection bilateral.   Neurovascular status: Intact. Chronic disuse lower extremity swelling; No pain with calf compression bilateral.  Musculoskeletal: + swan-neck hammertoes bilateral 2nd toes, decreased tenderness to bilateral hallux nail sites, Muscular strength 3/5 bilateral uses wheelchair   Assessment and Plan:  Problem List Items Addressed This Visit    None    Visit Diagnoses    Dermatophytosis of nail    -  Primary   Pre-ulcerative calluses       bil 2nd toes   Pain in toes of both feet       PVD (peripheral vascular disease) ([place])          -  Examined patient.  -Discussed treatment options for painful mycotic nails. -Mechanically debrided and reduced mycotic nails with sterile nail nipper and dremel nail file without incident. -Advised patient to avoid tight socks that could irritate the toes and cut opening in socks and applied antibiotic cream from preventative measures against infection -Patient to return in 3 months for follow up evaluation or sooner if symptoms worsen.  Landis Martins, DPM

## 2017-05-17 DIAGNOSIS — G609 Hereditary and idiopathic neuropathy, unspecified: Secondary | ICD-10-CM | POA: Diagnosis not present

## 2017-05-17 DIAGNOSIS — M6281 Muscle weakness (generalized): Secondary | ICD-10-CM | POA: Diagnosis not present

## 2017-05-17 DIAGNOSIS — R278 Other lack of coordination: Secondary | ICD-10-CM | POA: Diagnosis not present

## 2017-05-17 DIAGNOSIS — G729 Myopathy, unspecified: Secondary | ICD-10-CM | POA: Diagnosis not present

## 2017-05-18 DIAGNOSIS — M6281 Muscle weakness (generalized): Secondary | ICD-10-CM | POA: Diagnosis not present

## 2017-05-18 DIAGNOSIS — R278 Other lack of coordination: Secondary | ICD-10-CM | POA: Diagnosis not present

## 2017-05-18 DIAGNOSIS — G729 Myopathy, unspecified: Secondary | ICD-10-CM | POA: Diagnosis not present

## 2017-05-18 DIAGNOSIS — G609 Hereditary and idiopathic neuropathy, unspecified: Secondary | ICD-10-CM | POA: Diagnosis not present

## 2017-05-19 DIAGNOSIS — G609 Hereditary and idiopathic neuropathy, unspecified: Secondary | ICD-10-CM | POA: Diagnosis not present

## 2017-05-19 DIAGNOSIS — G729 Myopathy, unspecified: Secondary | ICD-10-CM | POA: Diagnosis not present

## 2017-05-19 DIAGNOSIS — R278 Other lack of coordination: Secondary | ICD-10-CM | POA: Diagnosis not present

## 2017-05-19 DIAGNOSIS — M6281 Muscle weakness (generalized): Secondary | ICD-10-CM | POA: Diagnosis not present

## 2017-05-22 DIAGNOSIS — R278 Other lack of coordination: Secondary | ICD-10-CM | POA: Diagnosis not present

## 2017-05-22 DIAGNOSIS — M6281 Muscle weakness (generalized): Secondary | ICD-10-CM | POA: Diagnosis not present

## 2017-05-22 DIAGNOSIS — G729 Myopathy, unspecified: Secondary | ICD-10-CM | POA: Diagnosis not present

## 2017-05-22 DIAGNOSIS — G609 Hereditary and idiopathic neuropathy, unspecified: Secondary | ICD-10-CM | POA: Diagnosis not present

## 2017-05-23 DIAGNOSIS — G609 Hereditary and idiopathic neuropathy, unspecified: Secondary | ICD-10-CM | POA: Diagnosis not present

## 2017-05-23 DIAGNOSIS — R278 Other lack of coordination: Secondary | ICD-10-CM | POA: Diagnosis not present

## 2017-05-23 DIAGNOSIS — G729 Myopathy, unspecified: Secondary | ICD-10-CM | POA: Diagnosis not present

## 2017-05-23 DIAGNOSIS — M6281 Muscle weakness (generalized): Secondary | ICD-10-CM | POA: Diagnosis not present

## 2017-05-24 DIAGNOSIS — M6281 Muscle weakness (generalized): Secondary | ICD-10-CM | POA: Diagnosis not present

## 2017-05-24 DIAGNOSIS — G729 Myopathy, unspecified: Secondary | ICD-10-CM | POA: Diagnosis not present

## 2017-05-24 DIAGNOSIS — G609 Hereditary and idiopathic neuropathy, unspecified: Secondary | ICD-10-CM | POA: Diagnosis not present

## 2017-05-24 DIAGNOSIS — R278 Other lack of coordination: Secondary | ICD-10-CM | POA: Diagnosis not present

## 2017-05-25 DIAGNOSIS — R278 Other lack of coordination: Secondary | ICD-10-CM | POA: Diagnosis not present

## 2017-05-25 DIAGNOSIS — M6281 Muscle weakness (generalized): Secondary | ICD-10-CM | POA: Diagnosis not present

## 2017-05-25 DIAGNOSIS — G609 Hereditary and idiopathic neuropathy, unspecified: Secondary | ICD-10-CM | POA: Diagnosis not present

## 2017-05-25 DIAGNOSIS — G729 Myopathy, unspecified: Secondary | ICD-10-CM | POA: Diagnosis not present

## 2017-05-26 DIAGNOSIS — R278 Other lack of coordination: Secondary | ICD-10-CM | POA: Diagnosis not present

## 2017-05-26 DIAGNOSIS — G729 Myopathy, unspecified: Secondary | ICD-10-CM | POA: Diagnosis not present

## 2017-05-26 DIAGNOSIS — G609 Hereditary and idiopathic neuropathy, unspecified: Secondary | ICD-10-CM | POA: Diagnosis not present

## 2017-05-26 DIAGNOSIS — M6281 Muscle weakness (generalized): Secondary | ICD-10-CM | POA: Diagnosis not present

## 2017-05-29 DIAGNOSIS — R278 Other lack of coordination: Secondary | ICD-10-CM | POA: Diagnosis not present

## 2017-05-29 DIAGNOSIS — G609 Hereditary and idiopathic neuropathy, unspecified: Secondary | ICD-10-CM | POA: Diagnosis not present

## 2017-05-29 DIAGNOSIS — M6281 Muscle weakness (generalized): Secondary | ICD-10-CM | POA: Diagnosis not present

## 2017-05-29 DIAGNOSIS — G729 Myopathy, unspecified: Secondary | ICD-10-CM | POA: Diagnosis not present

## 2017-05-30 DIAGNOSIS — G609 Hereditary and idiopathic neuropathy, unspecified: Secondary | ICD-10-CM | POA: Diagnosis not present

## 2017-05-30 DIAGNOSIS — R278 Other lack of coordination: Secondary | ICD-10-CM | POA: Diagnosis not present

## 2017-05-30 DIAGNOSIS — G729 Myopathy, unspecified: Secondary | ICD-10-CM | POA: Diagnosis not present

## 2017-05-30 DIAGNOSIS — M6281 Muscle weakness (generalized): Secondary | ICD-10-CM | POA: Diagnosis not present

## 2017-05-31 DIAGNOSIS — G609 Hereditary and idiopathic neuropathy, unspecified: Secondary | ICD-10-CM | POA: Diagnosis not present

## 2017-05-31 DIAGNOSIS — G729 Myopathy, unspecified: Secondary | ICD-10-CM | POA: Diagnosis not present

## 2017-05-31 DIAGNOSIS — R278 Other lack of coordination: Secondary | ICD-10-CM | POA: Diagnosis not present

## 2017-05-31 DIAGNOSIS — M6281 Muscle weakness (generalized): Secondary | ICD-10-CM | POA: Diagnosis not present

## 2017-06-01 DIAGNOSIS — M4802 Spinal stenosis, cervical region: Secondary | ICD-10-CM | POA: Diagnosis not present

## 2017-06-01 DIAGNOSIS — G609 Hereditary and idiopathic neuropathy, unspecified: Secondary | ICD-10-CM | POA: Diagnosis not present

## 2017-06-01 DIAGNOSIS — R278 Other lack of coordination: Secondary | ICD-10-CM | POA: Diagnosis not present

## 2017-06-01 DIAGNOSIS — M5136 Other intervertebral disc degeneration, lumbar region: Secondary | ICD-10-CM | POA: Diagnosis not present

## 2017-06-01 DIAGNOSIS — G729 Myopathy, unspecified: Secondary | ICD-10-CM | POA: Diagnosis not present

## 2017-06-01 DIAGNOSIS — M6281 Muscle weakness (generalized): Secondary | ICD-10-CM | POA: Diagnosis not present

## 2017-06-01 DIAGNOSIS — R531 Weakness: Secondary | ICD-10-CM | POA: Diagnosis not present

## 2017-06-05 DIAGNOSIS — R278 Other lack of coordination: Secondary | ICD-10-CM | POA: Diagnosis not present

## 2017-06-05 DIAGNOSIS — M6281 Muscle weakness (generalized): Secondary | ICD-10-CM | POA: Diagnosis not present

## 2017-06-05 DIAGNOSIS — G729 Myopathy, unspecified: Secondary | ICD-10-CM | POA: Diagnosis not present

## 2017-06-05 DIAGNOSIS — G609 Hereditary and idiopathic neuropathy, unspecified: Secondary | ICD-10-CM | POA: Diagnosis not present

## 2017-06-07 DIAGNOSIS — R278 Other lack of coordination: Secondary | ICD-10-CM | POA: Diagnosis not present

## 2017-06-07 DIAGNOSIS — G729 Myopathy, unspecified: Secondary | ICD-10-CM | POA: Diagnosis not present

## 2017-06-07 DIAGNOSIS — G609 Hereditary and idiopathic neuropathy, unspecified: Secondary | ICD-10-CM | POA: Diagnosis not present

## 2017-06-07 DIAGNOSIS — M6281 Muscle weakness (generalized): Secondary | ICD-10-CM | POA: Diagnosis not present

## 2017-06-08 DIAGNOSIS — G729 Myopathy, unspecified: Secondary | ICD-10-CM | POA: Diagnosis not present

## 2017-06-08 DIAGNOSIS — G719 Primary disorder of muscle, unspecified: Secondary | ICD-10-CM | POA: Diagnosis not present

## 2017-06-08 DIAGNOSIS — Z87891 Personal history of nicotine dependence: Secondary | ICD-10-CM | POA: Diagnosis not present

## 2017-06-08 DIAGNOSIS — G8250 Quadriplegia, unspecified: Secondary | ICD-10-CM | POA: Diagnosis not present

## 2017-06-08 DIAGNOSIS — Z993 Dependence on wheelchair: Secondary | ICD-10-CM | POA: Diagnosis not present

## 2017-06-08 DIAGNOSIS — G609 Hereditary and idiopathic neuropathy, unspecified: Secondary | ICD-10-CM | POA: Diagnosis not present

## 2017-06-08 DIAGNOSIS — G1221 Amyotrophic lateral sclerosis: Secondary | ICD-10-CM | POA: Diagnosis not present

## 2017-06-08 DIAGNOSIS — M6281 Muscle weakness (generalized): Secondary | ICD-10-CM | POA: Diagnosis not present

## 2017-06-08 DIAGNOSIS — R278 Other lack of coordination: Secondary | ICD-10-CM | POA: Diagnosis not present

## 2017-06-09 DIAGNOSIS — G609 Hereditary and idiopathic neuropathy, unspecified: Secondary | ICD-10-CM | POA: Diagnosis not present

## 2017-06-09 DIAGNOSIS — G729 Myopathy, unspecified: Secondary | ICD-10-CM | POA: Diagnosis not present

## 2017-06-09 DIAGNOSIS — R278 Other lack of coordination: Secondary | ICD-10-CM | POA: Diagnosis not present

## 2017-06-09 DIAGNOSIS — M6281 Muscle weakness (generalized): Secondary | ICD-10-CM | POA: Diagnosis not present

## 2017-06-10 DIAGNOSIS — M6281 Muscle weakness (generalized): Secondary | ICD-10-CM | POA: Diagnosis not present

## 2017-06-10 DIAGNOSIS — R278 Other lack of coordination: Secondary | ICD-10-CM | POA: Diagnosis not present

## 2017-06-10 DIAGNOSIS — G609 Hereditary and idiopathic neuropathy, unspecified: Secondary | ICD-10-CM | POA: Diagnosis not present

## 2017-06-10 DIAGNOSIS — G729 Myopathy, unspecified: Secondary | ICD-10-CM | POA: Diagnosis not present

## 2017-06-11 ENCOUNTER — Encounter: Payer: Self-pay | Admitting: Family Medicine

## 2017-06-12 DIAGNOSIS — G729 Myopathy, unspecified: Secondary | ICD-10-CM | POA: Diagnosis not present

## 2017-06-12 DIAGNOSIS — G609 Hereditary and idiopathic neuropathy, unspecified: Secondary | ICD-10-CM | POA: Diagnosis not present

## 2017-06-12 DIAGNOSIS — M6281 Muscle weakness (generalized): Secondary | ICD-10-CM | POA: Diagnosis not present

## 2017-06-12 DIAGNOSIS — R278 Other lack of coordination: Secondary | ICD-10-CM | POA: Diagnosis not present

## 2017-06-13 DIAGNOSIS — G729 Myopathy, unspecified: Secondary | ICD-10-CM | POA: Diagnosis not present

## 2017-06-13 DIAGNOSIS — R278 Other lack of coordination: Secondary | ICD-10-CM | POA: Diagnosis not present

## 2017-06-13 DIAGNOSIS — G609 Hereditary and idiopathic neuropathy, unspecified: Secondary | ICD-10-CM | POA: Diagnosis not present

## 2017-06-13 DIAGNOSIS — M6281 Muscle weakness (generalized): Secondary | ICD-10-CM | POA: Diagnosis not present

## 2017-06-14 ENCOUNTER — Ambulatory Visit (INDEPENDENT_AMBULATORY_CARE_PROVIDER_SITE_OTHER): Payer: Medicare Other | Admitting: Family Medicine

## 2017-06-14 ENCOUNTER — Encounter: Payer: Self-pay | Admitting: Family Medicine

## 2017-06-14 ENCOUNTER — Encounter: Payer: Medicare Other | Attending: Physical Medicine & Rehabilitation | Admitting: Physical Medicine & Rehabilitation

## 2017-06-14 ENCOUNTER — Encounter: Payer: Self-pay | Admitting: Physical Medicine & Rehabilitation

## 2017-06-14 VITALS — BP 132/84 | HR 94

## 2017-06-14 VITALS — BP 130/71 | HR 80 | Temp 97.9°F | Resp 16

## 2017-06-14 DIAGNOSIS — G1221 Amyotrophic lateral sclerosis: Secondary | ICD-10-CM

## 2017-06-14 DIAGNOSIS — M21371 Foot drop, right foot: Secondary | ICD-10-CM | POA: Diagnosis not present

## 2017-06-14 DIAGNOSIS — E119 Type 2 diabetes mellitus without complications: Secondary | ICD-10-CM | POA: Diagnosis not present

## 2017-06-14 DIAGNOSIS — Z87891 Personal history of nicotine dependence: Secondary | ICD-10-CM | POA: Insufficient documentation

## 2017-06-14 DIAGNOSIS — R600 Localized edema: Secondary | ICD-10-CM | POA: Insufficient documentation

## 2017-06-14 DIAGNOSIS — R531 Weakness: Secondary | ICD-10-CM | POA: Insufficient documentation

## 2017-06-14 DIAGNOSIS — Z87442 Personal history of urinary calculi: Secondary | ICD-10-CM | POA: Insufficient documentation

## 2017-06-14 DIAGNOSIS — E559 Vitamin D deficiency, unspecified: Secondary | ICD-10-CM | POA: Insufficient documentation

## 2017-06-14 DIAGNOSIS — R609 Edema, unspecified: Secondary | ICD-10-CM | POA: Diagnosis not present

## 2017-06-14 DIAGNOSIS — M4802 Spinal stenosis, cervical region: Secondary | ICD-10-CM | POA: Insufficient documentation

## 2017-06-14 DIAGNOSIS — M419 Scoliosis, unspecified: Secondary | ICD-10-CM | POA: Insufficient documentation

## 2017-06-14 DIAGNOSIS — G8929 Other chronic pain: Secondary | ICD-10-CM | POA: Insufficient documentation

## 2017-06-14 DIAGNOSIS — M109 Gout, unspecified: Secondary | ICD-10-CM | POA: Diagnosis not present

## 2017-06-14 DIAGNOSIS — M545 Low back pain: Secondary | ICD-10-CM | POA: Diagnosis not present

## 2017-06-14 DIAGNOSIS — R739 Hyperglycemia, unspecified: Secondary | ICD-10-CM | POA: Insufficient documentation

## 2017-06-14 DIAGNOSIS — M5416 Radiculopathy, lumbar region: Secondary | ICD-10-CM | POA: Diagnosis not present

## 2017-06-14 DIAGNOSIS — M21372 Foot drop, left foot: Secondary | ICD-10-CM | POA: Insufficient documentation

## 2017-06-14 DIAGNOSIS — R7303 Prediabetes: Secondary | ICD-10-CM | POA: Insufficient documentation

## 2017-06-14 DIAGNOSIS — K219 Gastro-esophageal reflux disease without esophagitis: Secondary | ICD-10-CM | POA: Insufficient documentation

## 2017-06-14 DIAGNOSIS — R2689 Other abnormalities of gait and mobility: Secondary | ICD-10-CM | POA: Diagnosis not present

## 2017-06-14 DIAGNOSIS — G5600 Carpal tunnel syndrome, unspecified upper limb: Secondary | ICD-10-CM | POA: Diagnosis not present

## 2017-06-14 DIAGNOSIS — R29898 Other symptoms and signs involving the musculoskeletal system: Secondary | ICD-10-CM | POA: Diagnosis not present

## 2017-06-14 DIAGNOSIS — I10 Essential (primary) hypertension: Secondary | ICD-10-CM | POA: Diagnosis not present

## 2017-06-14 LAB — MICROALBUMIN / CREATININE URINE RATIO
Creatinine,U: 61.5 mg/dL
MICROALB/CREAT RATIO: 2.9 mg/g (ref 0.0–30.0)
Microalb, Ur: 1.8 mg/dL (ref 0.0–1.9)

## 2017-06-14 LAB — POCT GLYCOSYLATED HEMOGLOBIN (HGB A1C): HEMOGLOBIN A1C: 6.2

## 2017-06-14 NOTE — Progress Notes (Signed)
Subjective:    Patient ID: Ethan Rose, male    DOB: [birth year], 70 y.o.   MRN: [id number]  HPI   Ethan Rose is here in follow up of his chronic gait disorder. He has had ongoing weakness, even affecting his upper extremities. He has had an EMG which was indicative of ALS.  He saw Dr.Caress recently who is pursuing treatment and work up for other potential causes of his presentation. He denies any problems breathing, swallowing or with speech.   He is maintaining physical activity as he can. He is looking into another wheelchair and to getting more help at the house. He is also considering a ramp   Pain Inventory Average Pain 0 Pain Right Now 0 My pain is na  In the last 24 hours, has pain interfered with the following? General activity 0 Relation with others 0 Enjoyment of life 0 What TIME of day is your pain at its worst? na Sleep (in general) Fair  Pain is worse with: na Pain improves with: na Relief from Meds: na  Mobility use a wheelchair needs help with transfers  Function retired  Neuro/Psych weakness  Prior Studies nerve study  Physicians involved in your care Any changes since last visit?  yes neuromuscular specialist   Family History  Problem Relation Age of Onset  . Stroke Mother   . Diabetes Mother   . Heart disease Mother   . Hypertension Mother   . Heart attack Mother   . Stroke Father   . Heart disease Father   . Diabetes Brother   . Hypertension Brother    Social History   Social History  . Marital status: Widowed    Spouse name: N/A  . Number of children: N/A  . Years of education: N/A   Social History Main Topics  . Smoking status: Former Smoker    Packs/day: 1.00    Years: 50.00    Types: Cigarettes    Quit date: 02/12/2014  . Smokeless tobacco: Never Used  . Alcohol use 4.8 oz/week    8 Cans of beer per week     Comment: 1-2 beers q day   . Drug use: No  . Sexual activity: No   Other Topics Concern  . Not on file    Social History Narrative   Widower as of 59 (wife [name] d. [place]), has one daughter and one grandson.   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   Orig from [place].  Has lived in [place] x 25 yrs.   Tobacco 50 pack-yr hx--Quit 02/2014.     Alcohol: 2 beers per day avg.  No drugs.   Exercise: none.  Has active job/physical labor.   Admitted to [facility] 03/2016   FULL CODE   Past Surgical History:  Procedure Laterality Date  . ABDOMINAL EXPOSURE N/A 03/24/2015   Procedure: ABDOMINAL EXPOSURE;  Surgeon: [name], MD;  Location: [facility];  Service: Vascular;  Laterality: N/A;  left side approach  . ANTERIOR CERVICAL DECOMP/DISCECTOMY FUSION N/A 03/18/2016   Procedure: Cervical four-five Cervical five-six Cervical six-seven Anterior cervical decompression/diskectomy/fusion;  Surgeon: [name], MD;  Location: [place] [facility]UR[facility]S;  Service: Neurosurgery;  Laterality: N/A;  n  . ANTERIOR LAT LUMBAR FUSION Right 03/24/2015   Procedure: Right Lumbar two-Three,Lumbar Three-Four,Lumbar Four-Five Anterior lateral lumbar interbody fusion ;  Surgeon: [name], MD;  Location: [place] NEURO ORS;  Service: Neurosurgery;  Laterality: Right;  right  . COLONOSCOPY  approx 2011   Dr. [name] (  normal per pt report)  . CYSTOSCOPY WITH RETROGRADE PYELOGRAM, URETEROSCOPY AND STENT PLACEMENT Left 08/15/2016   Procedure: CYSTOSCOPY WITH STENT REMOVAL,  RETROGRADES, URETEROSCOPY, STONE BASKETRY AND STENT REPLACEMENT;  Surgeon: [name], MD;  Location: [facility];  Service: Urology;  Laterality: Left;  . CYSTOSCOPY WITH STENT PLACEMENT Left 07/16/2016   Procedure: CYSTOSCOPY, RETROGRADE WITH LEFT URETERAL STENT PLACEMENT;  Surgeon: [name], MD;  Location: [facility];  Service: Urology;  Laterality: Left;  . CYSTOSCOPY WITH STENT PLACEMENT Bilateral 02/27/2017   Procedure: CYSTOSCOPY WITH STENT PLACEMENT;  Surgeon: [name], MD;  Location: [facility];  Service: Urology;  Laterality:  Bilateral;  . CYSTOSCOPY/RETROGRADE/URETEROSCOPY/STONE EXTRACTION WITH BASKET Bilateral 02/27/2017   Procedure: CYSTOSCOPY/RETROGRADE/URETEROSCOPY/STONE EXTRACTION WITH BASKET;  Surgeon: [name], MD;  Location: [facility];  Service: Urology;  Laterality: Bilateral;  . HOLMIUM LASER APPLICATION Left 99/12/4266   Procedure: HOLMIUM LASER;  Surgeon: [name], MD;  Location: [facility];  Service: Urology;  Laterality: Left;  . INGUINAL HERNIA REPAIR Bilateral 1993  . POSTERIOR LUMBAR FUSION 4 LEVEL N/A 03/27/2015   Procedure: Posterior pedicle subtraction osteotomy with T10 to Iliac fusion with Dr. [name] assisting;  Surgeon: [name], MD;  Location: [facility]URO ORS;  Service: Neurosurgery;  Laterality: N/A;  Posterior pedicle subtraction osteotomy with T10 to Iliac fusion with Dr. [name] assisting  . TONSILLECTOMY  1954   Past Medical History:  Diagnosis Date  . Allergic rhinitis   . Amyotrophic lateral sclerosis (ALS) ([place]) 05/2017   Dr. Scarlette Slice Caress--[employer].  . At high risk for falls   . Bilateral leg weakness    uses wheelchair  . BPH with obstruction/lower urinary tract symptoms   . BPPV (benign paroxysmal positional vertigo) 03/30/2012  . Carpal tunnel syndrome   . Cervical myelopathy ([place])   . Chronic low back pain   . Degenerative spinal arthritis   . Essential hypertension   . Foot drop, bilateral    multiple back sx's  (does use AFO's , but does have them)  . GERD (gastroesophageal reflux disease)   . History of cardiac murmur as a child   . History of gout    left hand  . History of kidney stones    since 1997  . History of sepsis    09/ 2017 urosepsis  . History of squamous cell carcinoma excision    right thigh 03/ 2016  . Inability to walk    since sepsis 09/ 2017 per pt left him severely debilitated due to weakness  . Olecranon bursitis 2020/04/1916   Dr. Percell Miller  . Prediabetes 03/2017   by fasting  glucose and random glucose criteria.  . Renal calculi    bilateral   . Slow transit constipation   . Spondylolisthesis of lumbar region   . Uses wheelchair    [phone] septic shock -- residual bilateral leg weakness w/ inability to walk  . Vitamin D deficiency    There were no vitals taken for this visit.  Opioid Risk Score:   Fall Risk Score:  `1  Depression screen PHQ 2/9  Depression screen PHQ 2/9 08/23/2016  Decreased Interest 0  Down, Depressed, Hopeless 0  PHQ - 2 Score 0     Review of Systems  Constitutional: Negative.   HENT: Negative.   Eyes: Negative.   Respiratory: Negative.   Cardiovascular: Negative.   Gastrointestinal: Negative.   Endocrine: Negative.   Genitourinary: Negative.  Musculoskeletal: Negative.   Skin: Negative.   Allergic/Immunologic: Negative.   Neurological: Negative.   Hematological: Negative.   Psychiatric/Behavioral: Negative.   All other systems reviewed and are negative.      Objective:   Physical Exam  Constitutional: No distress. Appears to have lost some weight HENT: NCAT Card: RRR Respiratory: normal effort.  GI: Soft. Nontender. Genitourinary: n/a Musculoskeletal: He exhibits 1+ bilateral LE edema   -golf ball sized fluid collection left olecranon Neurological: He is alertand oriented.  Motor 4 to 4+/5 proximal to distal in the UE's.  LLE: HF 2 to /5, KE 2-/5, KE 1+/5, APF 2+/5, ADF remains trace to 1. RLE: HF 2, KE 2+ to 4, KF 1+/5, APF 2+5 ADF 1/5---no substantial change in LE's. Skin: Skin is warmand dry.  Psychiatric: He has a normal mood and affect. His behavior is normal. Judgmentand thought contentnormal.      Assessment & Plan:  Medical Problem List and Plan: 1. Mobility and functional deficitssecondary to debility, chronic radiculopathy/myelopathy -continue HH therapies as tolerated.   -encouraged implementation of hired help at home to assist as needed  -customized chairat some  point 2. Lower extremity edema: continue elevation             -TEDS             -maintain physical activity as tolerated  3. Newly diagnosed ALS: ?variant, looking back he may have had symptoms dating back to 2014. He is lacking any bulbar signs currently.   -assessment and diagnosis at [facility]  -consideration of powered w/c but would wait until assessment/plan in place from [facility]  -encouraged placement of ramp at home- 4. Left olecranon bursitis--- -resolved   Follow up in 51months. 15 minutes of face to face patient care time were spent during this visit. All questions were encouraged and answered. Greater than 50% of time during this encounter was spent counseling patient/family in regard to his ALS diagnosis, review of [facility] chart, discussion regarding treatment options. Marland Kitchen   Marland Kitchen

## 2017-06-14 NOTE — Progress Notes (Signed)
OFFICE VISIT  06/14/2017   CC:  Chief Complaint  Patient presents with  . Follow-up    RCI   HPI:    Patient is a 71 y.o. Caucasian male who presents for 3 mo f/u newly dx'd DM 2 three months ago. Has been doing diet modification only, no meds.  Says his diet is improved compared to diet prior to DM dx, but he admits it is not ideal, mainly b/c he has to eat whatever his helper cooks for him.  Only one home glucose check since last visit: 93. Says swelling in lower legs is improved with wearing compression stockings. Home bp checks daily: 120s/70s, rare reading in low 100s over 50s.    Unfortunately, since I last saw him he got dx'd with ALS by an MD at the [facility] [place] He gets PT in home.  Basically he is working on maintaining LE strength and flexibility.  Some days he can walk with rollator about 15 ft, some days none.  Arm strength is still pretty good.  He can transfer himself from [facility] to commode and back. He cannot get in or out of bed w/out assistance.  He is working on getting in-home aid with his long term care insurer.  He returns to [place] [facility] soon to develop a care plan.   Past Medical History:  Diagnosis Date  . Allergic rhinitis   . Amyotrophic lateral sclerosis (ALS) ([place]) 05/2017   Dr. Scarlette Slice Caress--[employer].  . At high risk for falls   . Bilateral leg weakness    uses wheelchair  . BPH with obstruction/lower urinary tract symptoms   . BPPV (benign paroxysmal positional vertigo) 03/30/2012  . Carpal tunnel syndrome   . Cervical myelopathy ([place])   . Chronic low back pain   . Degenerative spinal arthritis   . Essential hypertension   . Foot drop, bilateral    multiple back sx's  (does use AFO's , but does have them)  . GERD (gastroesophageal reflux disease)   . History of cardiac murmur as a child   . History of gout    left hand  . History of kidney stones    since 1997  . History of sepsis    09/ 2017 urosepsis  . History of  squamous cell carcinoma excision    right thigh 03/ 2016  . Inability to walk    since sepsis 09/ 2017 per pt left him severely debilitated due to weakness  . Olecranon bursitis 12/25/202017   Dr. Percell Miller  . Prediabetes 03/2017   by fasting glucose and random glucose criteria.  . Renal calculi    bilateral   . Slow transit constipation   . Spondylolisthesis of lumbar region   . Uses wheelchair    [phone] septic shock -- residual bilateral leg weakness w/ inability to walk  . Vitamin D deficiency     Past Surgical History:  Procedure Laterality Date  . ABDOMINAL EXPOSURE N/A 03/24/2015   Procedure: ABDOMINAL EXPOSURE;  Surgeon: [name], MD;  Location: [facility];  Service: Vascular;  Laterality: N/A;  left side approach  . ANTERIOR CERVICAL DECOMP/DISCECTOMY FUSION N/A 03/18/2016   Procedure: Cervical four-five Cervical five-six Cervical six-seven Anterior cervical decompression/diskectomy/fusion;  Surgeon: [name], MD;  Location: [place] NEURO ORS;  Service: Neurosurgery;  Laterality: N/A;  n  . ANTERIOR LAT LUMBAR FUSION Right 03/24/2015   Procedure: Right Lumbar two-Three,Lumbar Three-Four,Lumbar Four-Five Anterior lateral lumbar interbody fusion ;  Surgeon: [name], MD;  Location: [place] NEURO ORS;  Service: Neurosurgery;  Laterality: Right;  right  . COLONOSCOPY  approx 2011   Dr. [name] (normal per pt report)  . CYSTOSCOPY WITH RETROGRADE PYELOGRAM, URETEROSCOPY AND STENT PLACEMENT Left 08/15/2016   Procedure: CYSTOSCOPY WITH STENT REMOVAL,  RETROGRADES, URETEROSCOPY, STONE BASKETRY AND STENT REPLACEMENT;  Surgeon: [name], MD;  [facility];  Service: Urology;  Laterality: Left;  . CYSTOSCOPY WITH STENT PLACEMENT Left 07/16/2016   Procedure: CYSTOSCOPY, RETROGRADE WITH LEFT URETERAL STENT PLACEMENT;  Surgeon: [name], MD;  [facility];  Service: Urology;  Laterality: Left;  . CYSTOSCOPY WITH STENT PLACEMENT Bilateral 02/27/2017   Procedure:  CYSTOSCOPY WITH STENT PLACEMENT;  Surgeon: [name], MD;  Location: [facility];  Service: Urology;  Laterality: Bilateral;  . CYSTOSCOPY/RETROGRADE/URETEROSCOPY/STONE EXTRACTION WITH BASKET Bilateral 02/27/2017   Procedure: CYSTOSCOPY/RETROGRADE/URETEROSCOPY/STONE EXTRACTION WITH BASKET;  Surgeon: [name], MD;  Location: [facility];  Service: Urology;  Laterality: Bilateral;  . HOLMIUM LASER APPLICATION Left 95/0/9326   Procedure: HOLMIUM LASER;  Surgeon: [name], MD;  [facility]: [facility];  Service: Urology;  Laterality: Left;  . INGUINAL HERNIA REPAIR Bilateral 1993  . POSTERIOR LUMBAR FUSION 4 LEVEL N/A 03/27/2015   Procedure: Posterior pedicle subtraction osteotomy with T10 to Iliac fusion with Dr. [name] assisting;  Surgeon: [name], MD;  Location: [facility] NEURO ORS;  Service: Neurosurgery;  Laterality: N/A;  Posterior pedicle subtraction osteotomy with T10 to Iliac fusion with Dr. [name] assisting  . TONSILLECTOMY  1954    Outpatient Medications Prior to Visit  Medication Sig Dispense Refill  . alfuzosin (UROXATRAL) 10 MG 24 hr tablet Take 10 mg by mouth at bedtime.     . Cholecalciferol (VITAMIN D3) 5000 units CAPS Take 1 capsule by mouth daily.    . Menaquinone-7 (VITAMIN K2 PO) Take 1 tablet by mouth daily.     . Multiple Vitamins-Minerals (MULTIVITAMIN WITH MINERALS) tablet Take 1 tablet by mouth daily.    . nitrofurantoin (MACRODANTIN) 100 MG capsule Take 100 mg by mouth 2 (two) times daily.    Marland Kitchen OVER THE COUNTER MEDICATION Take 1 Dose by mouth daily. Over the counter OPC3 and Isochrome powders 1 capful of both mixed in 4 ounces of water daily    . OVER THE COUNTER MEDICATION Take 1 capsule by mouth daily. neuroquell    . OVER THE COUNTER MEDICATION Take 1 tablet by mouth 2 (two) times daily. Public relations account executive    . OVER THE COUNTER MEDICATION Take 1 capsule by mouth daily. VITAL ARTERY CLEANSE    . Potassium Citrate  (UROCIT-K 15) 15 MEQ (1620 MG) TBCR Take 1 tablet by mouth 2 (two) times daily.    . Probiotic Product (PROBIOTIC PO) Take 50 mg by mouth daily.     No facility-administered medications prior to visit.     No Known Allergies  ROS As per HPI  PE: Blood pressure 130/71, pulse 80, temperature 97.9 F (36.6 C), temperature source Oral, resp. rate 16, SpO2 97 %. Gen: Alert, well appearing.  Patient is oriented to person, place, time, and situation. He has generalized muscle wasting--mild. CV: RRR, no m/r/g.   LUNGS: CTA bilat, nonlabored resps, good aeration in all lung fields. EXT: 2+ bilat LE pitting edema from mid tibia level into feet bilat--has compression hose on. NEURO: CN 2-12 intact.  UE strength 5-/5 prox/dist bilat.  LE strength 2/5 bilat proximally, 3/5 bilat  in LLs, 2/5 bilat foot dorsiflexion.  Feet in mild flexed position bilat when resting in WC.   LABS:  Lab Results  Component Value Date   WBC 15.2 03/06/2017   HGB 12.5 (A) 03/06/2017   HCT 37 (A) 03/06/2017   MCV 88.4 08/04/2016   PLT 376 03/06/2017   Lab Results  Component Value Date   IRON 81 03/15/2017   IRON 81 03/15/2017   TIBC 285 03/15/2017   FERRITIN 180.6 03/15/2017   Lab Results  Component Value Date   VITAMINB12 >1500 (H) 03/15/2017      Chemistry      Component Value Date/Time   NA 137 03/15/2017 1124   NA 135 (A) 03/06/2017   K 4.0 03/15/2017 1124   CL 99 03/15/2017 1124   CO2 28 03/15/2017 1124   BUN 16 03/15/2017 1124   BUN 22 (A) 03/06/2017   CREATININE 0.39 (L) 03/15/2017 1124   CREATININE 0.77 03/30/2012 1537   GLU 204 03/06/2017      Component Value Date/Time   CALCIUM 9.0 03/15/2017 1124   ALKPHOS 94 07/23/2016 0534   AST 24 07/23/2016 0534   ALT 30 07/23/2016 0534   BILITOT 0.6 07/23/2016 0534     Lab Results  Component Value Date   CHOL 186 03/15/2017   HDL 37.70 (L) 03/15/2017   LDLCALC 127 (H) 03/15/2017   TRIG 104.0 03/15/2017   CHOLHDL 5 03/15/2017   Lab  Results  Component Value Date   HGBA1C 6.9 (H) 03/22/2017   POC HbA1c today= 6.2%  IMPRESSION AND PLAN:  1) DM 2;  A1c improved from 6.9% to 6.2% over the last 3 mo with dietary modification only. Urine microalb/cr today. Will do feet exam next f/u visit.  2) LE edema: improved with compression stockings.  3) ALS: this is primarily affecting his legs at this point, esp lower legs. he'll be followed at the [place] [facility] [place]. Continue with PT here for now, expecting more comprehensive PT/OT/Speech therapy plan after next visit at [place] [facility].  Fortunately, it seems the neurologist is optimistic that his dz seems to be the slow- progressing type.  An After Visit Summary was printed and given to the patient.  FOLLOW UP: Return in about 3 months (around 09/14/2017) for routine chronic illness f/u (30 min).  Signed:  Crissie Sickles, MD           06/14/2017

## 2017-06-14 NOTE — Patient Instructions (Signed)
PLEASE FEEL FREE TO CALL OUR OFFICE WITH ANY PROBLEMS OR QUESTIONS ([phone])      

## 2017-06-15 DIAGNOSIS — R278 Other lack of coordination: Secondary | ICD-10-CM | POA: Diagnosis not present

## 2017-06-15 DIAGNOSIS — M6281 Muscle weakness (generalized): Secondary | ICD-10-CM | POA: Diagnosis not present

## 2017-06-15 DIAGNOSIS — G609 Hereditary and idiopathic neuropathy, unspecified: Secondary | ICD-10-CM | POA: Diagnosis not present

## 2017-06-15 DIAGNOSIS — G729 Myopathy, unspecified: Secondary | ICD-10-CM | POA: Diagnosis not present

## 2017-06-16 DIAGNOSIS — G609 Hereditary and idiopathic neuropathy, unspecified: Secondary | ICD-10-CM | POA: Diagnosis not present

## 2017-06-16 DIAGNOSIS — G729 Myopathy, unspecified: Secondary | ICD-10-CM | POA: Diagnosis not present

## 2017-06-16 DIAGNOSIS — M6281 Muscle weakness (generalized): Secondary | ICD-10-CM | POA: Diagnosis not present

## 2017-06-16 DIAGNOSIS — R278 Other lack of coordination: Secondary | ICD-10-CM | POA: Diagnosis not present

## 2017-06-19 DIAGNOSIS — R278 Other lack of coordination: Secondary | ICD-10-CM | POA: Diagnosis not present

## 2017-06-19 DIAGNOSIS — G609 Hereditary and idiopathic neuropathy, unspecified: Secondary | ICD-10-CM | POA: Diagnosis not present

## 2017-06-19 DIAGNOSIS — G729 Myopathy, unspecified: Secondary | ICD-10-CM | POA: Diagnosis not present

## 2017-06-19 DIAGNOSIS — M6281 Muscle weakness (generalized): Secondary | ICD-10-CM | POA: Diagnosis not present

## 2017-06-20 DIAGNOSIS — R278 Other lack of coordination: Secondary | ICD-10-CM | POA: Diagnosis not present

## 2017-06-20 DIAGNOSIS — M6281 Muscle weakness (generalized): Secondary | ICD-10-CM | POA: Diagnosis not present

## 2017-06-20 DIAGNOSIS — G609 Hereditary and idiopathic neuropathy, unspecified: Secondary | ICD-10-CM | POA: Diagnosis not present

## 2017-06-20 DIAGNOSIS — G729 Myopathy, unspecified: Secondary | ICD-10-CM | POA: Diagnosis not present

## 2017-06-21 DIAGNOSIS — G609 Hereditary and idiopathic neuropathy, unspecified: Secondary | ICD-10-CM | POA: Diagnosis not present

## 2017-06-21 DIAGNOSIS — G729 Myopathy, unspecified: Secondary | ICD-10-CM | POA: Diagnosis not present

## 2017-06-21 DIAGNOSIS — R278 Other lack of coordination: Secondary | ICD-10-CM | POA: Diagnosis not present

## 2017-06-21 DIAGNOSIS — M6281 Muscle weakness (generalized): Secondary | ICD-10-CM | POA: Diagnosis not present

## 2017-06-22 DIAGNOSIS — G609 Hereditary and idiopathic neuropathy, unspecified: Secondary | ICD-10-CM | POA: Diagnosis not present

## 2017-06-22 DIAGNOSIS — M6281 Muscle weakness (generalized): Secondary | ICD-10-CM | POA: Diagnosis not present

## 2017-06-22 DIAGNOSIS — G729 Myopathy, unspecified: Secondary | ICD-10-CM | POA: Diagnosis not present

## 2017-06-22 DIAGNOSIS — R278 Other lack of coordination: Secondary | ICD-10-CM | POA: Diagnosis not present

## 2017-06-23 DIAGNOSIS — N401 Enlarged prostate with lower urinary tract symptoms: Secondary | ICD-10-CM | POA: Diagnosis not present

## 2017-06-23 DIAGNOSIS — R351 Nocturia: Secondary | ICD-10-CM | POA: Diagnosis not present

## 2017-06-23 DIAGNOSIS — M6281 Muscle weakness (generalized): Secondary | ICD-10-CM | POA: Diagnosis not present

## 2017-06-23 DIAGNOSIS — R278 Other lack of coordination: Secondary | ICD-10-CM | POA: Diagnosis not present

## 2017-06-23 DIAGNOSIS — G609 Hereditary and idiopathic neuropathy, unspecified: Secondary | ICD-10-CM | POA: Diagnosis not present

## 2017-06-23 DIAGNOSIS — G729 Myopathy, unspecified: Secondary | ICD-10-CM | POA: Diagnosis not present

## 2017-06-23 DIAGNOSIS — N200 Calculus of kidney: Secondary | ICD-10-CM | POA: Diagnosis not present

## 2017-06-26 DIAGNOSIS — G729 Myopathy, unspecified: Secondary | ICD-10-CM | POA: Diagnosis not present

## 2017-06-26 DIAGNOSIS — G609 Hereditary and idiopathic neuropathy, unspecified: Secondary | ICD-10-CM | POA: Diagnosis not present

## 2017-06-26 DIAGNOSIS — M6281 Muscle weakness (generalized): Secondary | ICD-10-CM | POA: Diagnosis not present

## 2017-06-26 DIAGNOSIS — R278 Other lack of coordination: Secondary | ICD-10-CM | POA: Diagnosis not present

## 2017-06-28 DIAGNOSIS — M6281 Muscle weakness (generalized): Secondary | ICD-10-CM | POA: Diagnosis not present

## 2017-06-28 DIAGNOSIS — R278 Other lack of coordination: Secondary | ICD-10-CM | POA: Diagnosis not present

## 2017-06-28 DIAGNOSIS — G729 Myopathy, unspecified: Secondary | ICD-10-CM | POA: Diagnosis not present

## 2017-06-28 DIAGNOSIS — G609 Hereditary and idiopathic neuropathy, unspecified: Secondary | ICD-10-CM | POA: Diagnosis not present

## 2017-06-29 DIAGNOSIS — G729 Myopathy, unspecified: Secondary | ICD-10-CM | POA: Diagnosis not present

## 2017-06-29 DIAGNOSIS — M6281 Muscle weakness (generalized): Secondary | ICD-10-CM | POA: Diagnosis not present

## 2017-06-29 DIAGNOSIS — G609 Hereditary and idiopathic neuropathy, unspecified: Secondary | ICD-10-CM | POA: Diagnosis not present

## 2017-06-29 DIAGNOSIS — R278 Other lack of coordination: Secondary | ICD-10-CM | POA: Diagnosis not present

## 2017-06-30 DIAGNOSIS — M6281 Muscle weakness (generalized): Secondary | ICD-10-CM | POA: Diagnosis not present

## 2017-06-30 DIAGNOSIS — R278 Other lack of coordination: Secondary | ICD-10-CM | POA: Diagnosis not present

## 2017-06-30 DIAGNOSIS — G729 Myopathy, unspecified: Secondary | ICD-10-CM | POA: Diagnosis not present

## 2017-06-30 DIAGNOSIS — G609 Hereditary and idiopathic neuropathy, unspecified: Secondary | ICD-10-CM | POA: Diagnosis not present

## 2017-07-04 DIAGNOSIS — G609 Hereditary and idiopathic neuropathy, unspecified: Secondary | ICD-10-CM | POA: Diagnosis not present

## 2017-07-04 DIAGNOSIS — R278 Other lack of coordination: Secondary | ICD-10-CM | POA: Diagnosis not present

## 2017-07-04 DIAGNOSIS — M6281 Muscle weakness (generalized): Secondary | ICD-10-CM | POA: Diagnosis not present

## 2017-07-04 DIAGNOSIS — G729 Myopathy, unspecified: Secondary | ICD-10-CM | POA: Diagnosis not present

## 2017-07-05 DIAGNOSIS — G609 Hereditary and idiopathic neuropathy, unspecified: Secondary | ICD-10-CM | POA: Diagnosis not present

## 2017-07-05 DIAGNOSIS — R278 Other lack of coordination: Secondary | ICD-10-CM | POA: Diagnosis not present

## 2017-07-05 DIAGNOSIS — M6281 Muscle weakness (generalized): Secondary | ICD-10-CM | POA: Diagnosis not present

## 2017-07-05 DIAGNOSIS — G729 Myopathy, unspecified: Secondary | ICD-10-CM | POA: Diagnosis not present

## 2017-07-06 DIAGNOSIS — M6281 Muscle weakness (generalized): Secondary | ICD-10-CM | POA: Diagnosis not present

## 2017-07-06 DIAGNOSIS — G729 Myopathy, unspecified: Secondary | ICD-10-CM | POA: Diagnosis not present

## 2017-07-06 DIAGNOSIS — G609 Hereditary and idiopathic neuropathy, unspecified: Secondary | ICD-10-CM | POA: Diagnosis not present

## 2017-07-06 DIAGNOSIS — R278 Other lack of coordination: Secondary | ICD-10-CM | POA: Diagnosis not present

## 2017-07-11 DIAGNOSIS — G609 Hereditary and idiopathic neuropathy, unspecified: Secondary | ICD-10-CM | POA: Diagnosis not present

## 2017-07-11 DIAGNOSIS — M6281 Muscle weakness (generalized): Secondary | ICD-10-CM | POA: Diagnosis not present

## 2017-07-11 DIAGNOSIS — R278 Other lack of coordination: Secondary | ICD-10-CM | POA: Diagnosis not present

## 2017-07-11 DIAGNOSIS — G729 Myopathy, unspecified: Secondary | ICD-10-CM | POA: Diagnosis not present

## 2017-07-12 DIAGNOSIS — R278 Other lack of coordination: Secondary | ICD-10-CM | POA: Diagnosis not present

## 2017-07-12 DIAGNOSIS — M6281 Muscle weakness (generalized): Secondary | ICD-10-CM | POA: Diagnosis not present

## 2017-07-12 DIAGNOSIS — G729 Myopathy, unspecified: Secondary | ICD-10-CM | POA: Diagnosis not present

## 2017-07-12 DIAGNOSIS — G609 Hereditary and idiopathic neuropathy, unspecified: Secondary | ICD-10-CM | POA: Diagnosis not present

## 2017-07-13 DIAGNOSIS — G729 Myopathy, unspecified: Secondary | ICD-10-CM | POA: Diagnosis not present

## 2017-07-13 DIAGNOSIS — G609 Hereditary and idiopathic neuropathy, unspecified: Secondary | ICD-10-CM | POA: Diagnosis not present

## 2017-07-13 DIAGNOSIS — M6281 Muscle weakness (generalized): Secondary | ICD-10-CM | POA: Diagnosis not present

## 2017-07-13 DIAGNOSIS — R278 Other lack of coordination: Secondary | ICD-10-CM | POA: Diagnosis not present

## 2017-07-18 DIAGNOSIS — G609 Hereditary and idiopathic neuropathy, unspecified: Secondary | ICD-10-CM | POA: Diagnosis not present

## 2017-07-18 DIAGNOSIS — M6281 Muscle weakness (generalized): Secondary | ICD-10-CM | POA: Diagnosis not present

## 2017-07-18 DIAGNOSIS — G729 Myopathy, unspecified: Secondary | ICD-10-CM | POA: Diagnosis not present

## 2017-07-18 DIAGNOSIS — R278 Other lack of coordination: Secondary | ICD-10-CM | POA: Diagnosis not present

## 2017-07-19 DIAGNOSIS — M6281 Muscle weakness (generalized): Secondary | ICD-10-CM | POA: Diagnosis not present

## 2017-07-19 DIAGNOSIS — G729 Myopathy, unspecified: Secondary | ICD-10-CM | POA: Diagnosis not present

## 2017-07-19 DIAGNOSIS — G609 Hereditary and idiopathic neuropathy, unspecified: Secondary | ICD-10-CM | POA: Diagnosis not present

## 2017-07-19 DIAGNOSIS — R278 Other lack of coordination: Secondary | ICD-10-CM | POA: Diagnosis not present

## 2017-07-20 DIAGNOSIS — M6281 Muscle weakness (generalized): Secondary | ICD-10-CM | POA: Diagnosis not present

## 2017-07-20 DIAGNOSIS — R278 Other lack of coordination: Secondary | ICD-10-CM | POA: Diagnosis not present

## 2017-07-20 DIAGNOSIS — G729 Myopathy, unspecified: Secondary | ICD-10-CM | POA: Diagnosis not present

## 2017-07-20 DIAGNOSIS — G609 Hereditary and idiopathic neuropathy, unspecified: Secondary | ICD-10-CM | POA: Diagnosis not present

## 2017-07-25 DIAGNOSIS — G609 Hereditary and idiopathic neuropathy, unspecified: Secondary | ICD-10-CM | POA: Diagnosis not present

## 2017-07-25 DIAGNOSIS — G729 Myopathy, unspecified: Secondary | ICD-10-CM | POA: Diagnosis not present

## 2017-07-25 DIAGNOSIS — M6281 Muscle weakness (generalized): Secondary | ICD-10-CM | POA: Diagnosis not present

## 2017-07-25 DIAGNOSIS — R278 Other lack of coordination: Secondary | ICD-10-CM | POA: Diagnosis not present

## 2017-07-26 DIAGNOSIS — M6281 Muscle weakness (generalized): Secondary | ICD-10-CM | POA: Diagnosis not present

## 2017-07-26 DIAGNOSIS — G609 Hereditary and idiopathic neuropathy, unspecified: Secondary | ICD-10-CM | POA: Diagnosis not present

## 2017-07-26 DIAGNOSIS — G729 Myopathy, unspecified: Secondary | ICD-10-CM | POA: Diagnosis not present

## 2017-07-26 DIAGNOSIS — R278 Other lack of coordination: Secondary | ICD-10-CM | POA: Diagnosis not present

## 2017-07-27 DIAGNOSIS — G729 Myopathy, unspecified: Secondary | ICD-10-CM | POA: Diagnosis not present

## 2017-07-27 DIAGNOSIS — M6281 Muscle weakness (generalized): Secondary | ICD-10-CM | POA: Diagnosis not present

## 2017-07-27 DIAGNOSIS — R278 Other lack of coordination: Secondary | ICD-10-CM | POA: Diagnosis not present

## 2017-07-27 DIAGNOSIS — G609 Hereditary and idiopathic neuropathy, unspecified: Secondary | ICD-10-CM | POA: Diagnosis not present

## 2017-08-01 DIAGNOSIS — G609 Hereditary and idiopathic neuropathy, unspecified: Secondary | ICD-10-CM | POA: Diagnosis not present

## 2017-08-01 DIAGNOSIS — R278 Other lack of coordination: Secondary | ICD-10-CM | POA: Diagnosis not present

## 2017-08-01 DIAGNOSIS — M6281 Muscle weakness (generalized): Secondary | ICD-10-CM | POA: Diagnosis not present

## 2017-08-01 DIAGNOSIS — G729 Myopathy, unspecified: Secondary | ICD-10-CM | POA: Diagnosis not present

## 2017-08-02 DIAGNOSIS — R531 Weakness: Secondary | ICD-10-CM | POA: Diagnosis not present

## 2017-08-02 DIAGNOSIS — G1221 Amyotrophic lateral sclerosis: Secondary | ICD-10-CM | POA: Diagnosis not present

## 2017-08-02 DIAGNOSIS — Z87891 Personal history of nicotine dependence: Secondary | ICD-10-CM | POA: Diagnosis not present

## 2017-08-03 DIAGNOSIS — G729 Myopathy, unspecified: Secondary | ICD-10-CM | POA: Diagnosis not present

## 2017-08-03 DIAGNOSIS — R278 Other lack of coordination: Secondary | ICD-10-CM | POA: Diagnosis not present

## 2017-08-03 DIAGNOSIS — M6281 Muscle weakness (generalized): Secondary | ICD-10-CM | POA: Diagnosis not present

## 2017-08-03 DIAGNOSIS — G609 Hereditary and idiopathic neuropathy, unspecified: Secondary | ICD-10-CM | POA: Diagnosis not present

## 2017-08-04 ENCOUNTER — Encounter: Payer: Self-pay | Admitting: Family Medicine

## 2017-08-04 DIAGNOSIS — G609 Hereditary and idiopathic neuropathy, unspecified: Secondary | ICD-10-CM | POA: Diagnosis not present

## 2017-08-04 DIAGNOSIS — G729 Myopathy, unspecified: Secondary | ICD-10-CM | POA: Diagnosis not present

## 2017-08-04 DIAGNOSIS — M6281 Muscle weakness (generalized): Secondary | ICD-10-CM | POA: Diagnosis not present

## 2017-08-04 DIAGNOSIS — R278 Other lack of coordination: Secondary | ICD-10-CM | POA: Diagnosis not present

## 2017-08-08 DIAGNOSIS — G729 Myopathy, unspecified: Secondary | ICD-10-CM | POA: Diagnosis not present

## 2017-08-08 DIAGNOSIS — M6281 Muscle weakness (generalized): Secondary | ICD-10-CM | POA: Diagnosis not present

## 2017-08-08 DIAGNOSIS — G609 Hereditary and idiopathic neuropathy, unspecified: Secondary | ICD-10-CM | POA: Diagnosis not present

## 2017-08-08 DIAGNOSIS — R278 Other lack of coordination: Secondary | ICD-10-CM | POA: Diagnosis not present

## 2017-08-22 ENCOUNTER — Ambulatory Visit: Payer: Medicare Other | Admitting: Sports Medicine

## 2017-09-05 ENCOUNTER — Encounter: Payer: Self-pay | Admitting: Sports Medicine

## 2017-09-05 ENCOUNTER — Ambulatory Visit (INDEPENDENT_AMBULATORY_CARE_PROVIDER_SITE_OTHER): Payer: Medicare Other | Admitting: Sports Medicine

## 2017-09-05 DIAGNOSIS — M79674 Pain in right toe(s): Secondary | ICD-10-CM | POA: Diagnosis not present

## 2017-09-05 DIAGNOSIS — B351 Tinea unguium: Secondary | ICD-10-CM | POA: Diagnosis not present

## 2017-09-05 DIAGNOSIS — G1221 Amyotrophic lateral sclerosis: Secondary | ICD-10-CM

## 2017-09-05 DIAGNOSIS — I739 Peripheral vascular disease, unspecified: Secondary | ICD-10-CM

## 2017-09-05 DIAGNOSIS — M79675 Pain in left toe(s): Secondary | ICD-10-CM | POA: Diagnosis not present

## 2017-09-05 DIAGNOSIS — L84 Corns and callosities: Secondary | ICD-10-CM

## 2017-09-05 NOTE — Progress Notes (Signed)
Subjective: Ethan Rose is a 71 y.o. male patient seen today in office with complaint of painful thickened and elongated toenails; unable to trim and callus to 2nd toes that has been drying up and scabbing. Patient admits that he has been diagnosed with ALS. Patient has no other pedal complaints at this time.   Patient Active Problem List   Diagnosis Date Noted  . ALS (amyotrophic lateral sclerosis) ([place]) 06/14/2017  . Lumbar radiculopathy 11/29/2016  . Hypocalcemia 10/26/2016  . Ureteral stone with hydronephrosis 08/10/2016  . Debility 07/22/2016  . Pressure ulcer 07/18/2016  . E coli bacteremia   . Leukocytosis   . Pyelonephritis 07/16/2016  . Left ureteral stone 07/16/2016  . Cervical myelopathy ([place]) 03/18/2016  . 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 Other malaise and fatigue 05/14/2014  . Health maintenance examination 04/29/2013  . Weakness of both legs 04/29/2013  . Leg cramps 04/29/2013  . Elevated blood pressure reading without diagnosis of hypertension 03/30/2012  . BPPV (benign paroxysmal positional vertigo) 03/30/2012    Current Outpatient Prescriptions on File Prior to Visit  Medication Sig Dispense Refill  . alfuzosin (UROXATRAL) 10 MG 24 hr tablet Take 10 mg by mouth at bedtime.     . Cholecalciferol (VITAMIN D3) 5000 units CAPS Take 1 capsule by mouth daily.    . Menaquinone-7 (VITAMIN K2 PO) Take 1 tablet by mouth daily.     . Multiple Vitamins-Minerals (MULTIVITAMIN WITH MINERALS) tablet Take 1 tablet by mouth daily.    . nitrofurantoin (MACRODANTIN) 100 MG capsule Take 100 mg by mouth 2 (two) times daily.    Marland Kitchen OVER THE COUNTER MEDICATION Take 1 Dose by mouth daily. Over the counter OPC3 and Isochrome powders 1 capful of both mixed in 4 ounces of water daily    . OVER THE COUNTER MEDICATION Take 1 capsule by mouth daily. neuroquell    . OVER  THE COUNTER MEDICATION Take 1 tablet by mouth 2 (two) times daily. Public relations account executive    . OVER THE COUNTER MEDICATION Take 1 capsule by mouth daily. VITAL ARTERY CLEANSE    . Potassium Citrate (UROCIT-K 15) 15 MEQ (1620 MG) TBCR Take 1 tablet by mouth 2 (two) times daily.    . Probiotic Product (PROBIOTIC PO) Take 50 mg by mouth daily.     No current facility-administered medications on file prior to visit.     No Known Allergies  Objective: Physical Exam  General: Well developed, nourished, no acute distress, awake, alert and oriented x 3 in wheelchair assisted by brother  Dermatological: Skin is warm, dry, and supple bilateral, Nails 2-5 bilateral are tender, long, thick, and discolored with mild subungal debris, Bilateral hallux nail procedure sites are healing well, no webspace macerations present bilateral, + pre-ulcerative lesions at 2nd toes bilateral, no open lesions present bilateral. No signs of infection bilateral.   Neurovascular status: Intact. Chronic disuse lower extremity swelling; No pain with calf compression bilateral.  Musculoskeletal: + swan-neck hammertoes bilateral 2nd toes, decreased tenderness to bilateral hallux nail sites, Muscular strength 3/5 bilateral uses wheelchair   Assessment and Plan:  Problem List Items Addressed This Visit      Nervous and Auditory   ALS (amyotrophic lateral sclerosis) ([place])    Other Visit Diagnoses    Pre-ulcerative calluses    -  Primary   Dermatophytosis of nail       Pain in toes of both  feet       PVD (peripheral vascular disease) ([place])         -Examined patient.  -Discussed treatment options for painful mycotic nails. -Mechanically debrided and reduced mycotic nails with sterile nail nipper and dremel nail file without incident. -Advised patient to avoid tight socks that could irritate the toes and cut opening in socks and applied antibiotic cream from preventative measures against infection as I did today at 2nd toes   -Patient to return in 3 months for follow up evaluation or sooner if symptoms worsen.  Landis Martins, DPM

## 2017-09-18 ENCOUNTER — Ambulatory Visit (INDEPENDENT_AMBULATORY_CARE_PROVIDER_SITE_OTHER): Payer: Medicare Other | Admitting: Family Medicine

## 2017-09-18 ENCOUNTER — Encounter: Payer: Self-pay | Admitting: Family Medicine

## 2017-09-18 ENCOUNTER — Ambulatory Visit: Payer: Medicare Other | Admitting: Physical Medicine & Rehabilitation

## 2017-09-18 VITALS — BP 119/78 | HR 81 | Temp 98.3°F | Resp 16

## 2017-09-18 DIAGNOSIS — E119 Type 2 diabetes mellitus without complications: Secondary | ICD-10-CM | POA: Diagnosis not present

## 2017-09-18 DIAGNOSIS — H6122 Impacted cerumen, left ear: Secondary | ICD-10-CM

## 2017-09-18 LAB — POCT GLYCOSYLATED HEMOGLOBIN (HGB A1C): Hemoglobin A1C: 6.6

## 2017-09-18 MED ORDER — ZOSTER VAC RECOMB ADJUVANTED 50 MCG/0.5ML IM SUSR: 0.5000 mL | Freq: Once | INTRAMUSCULAR | 1 refills | Status: AC

## 2017-09-18 NOTE — Progress Notes (Signed)
OFFICE VISIT  09/18/2017   CC:  Chief Complaint  Patient presents with  . Follow-up    RCI   HPI:    Patient is a 71 y.o. Caucasian male who presents accompanied by his brother for 3 mo f/u DM 2 that we diagnosed within the last year. He has chronic debilitation secondary to ALS--followed by ALS clinic at [facility].  His HbA1c was down to 6.2% with diabetic diet and no diabetic meds at last visit. No excessive thirst or urination.  Of note, he is getting some [facility]. [facility] and [facility] of everything for him over the last couple of months. Feels like shoulders may be getting a bit weaker since I saw him last.  Has also felt weakness in hands but this is not new.  Chronic bilat leg weakness is marked. NO SOB.  Speech and swallowing are normal.  BMs normal.  No melena.  He has f/u with urologist next week, likely to address the question of continuing on chronic prophylactic antibiotics or not. He can sense a full bladder and empties well.  Has not had a urinary infection since he was hospitalized with urosepsis 02/2017.  ROS: no fevers, no rash, no abd pains, no n/v/d, no myalgias or arthralgias.  No paresthesias. Chronic bilat leg weakness--wheelchair bound.  Past Medical History:  Diagnosis Date  . Allergic rhinitis   . Amyotrophic lateral sclerosis (ALS) ([place]) 05/2017   Dr. Scarlette Slice Caress--[employer] ALS clinic.  . At high risk for falls   . Bilateral leg weakness    uses wheelchair  . BPH with obstruction/lower urinary tract symptoms   . BPPV (benign paroxysmal positional vertigo) 03/30/2012  . Carpal tunnel syndrome   . Cervical myelopathy ([place])   . Chronic low back pain   . Degenerative spinal arthritis   . Essential hypertension   . Foot drop, bilateral    multiple back sx's  (does use AFO's , but does have them)  . GERD (gastroesophageal reflux  disease)   . History of cardiac murmur as a child   . History of gout    left hand  . History of kidney stones    since 1997  . History of sepsis    09/ 2017 urosepsis  . History of squamous cell carcinoma excision    right thigh 03/ 2016  . Inability to walk    since sepsis 09/ 2017 per pt left him severely debilitated due to weakness  . Olecranon bursitis 2020/08/1816   Dr. Percell Miller  . Prediabetes 03/2017   by fasting glucose and random glucose criteria.  . Renal calculi    bilateral   . Slow transit constipation   . Spondylolisthesis of lumbar region   . Uses wheelchair    [phone] septic shock -- residual bilateral leg weakness w/ inability to walk  . Vitamin D deficiency     Past Surgical History:  Procedure Laterality Date  . COLONOSCOPY  approx 2011   Dr. [name] (normal per pt report)  . INGUINAL HERNIA REPAIR Bilateral 1993  . TONSILLECTOMY  1954    Outpatient Medications Prior to Visit  Medication Sig Dispense Refill  . alfuzosin (UROXATRAL) 10 MG 24 hr tablet Take 10 mg by mouth at bedtime.     . Cholecalciferol (VITAMIN D3) 5000 units CAPS Take 1 capsule by mouth daily.    Berneice Heinrich (  VITAMIN K2 PO) Take 1 tablet by mouth daily.     . Multiple Vitamins-Minerals (MULTIVITAMIN WITH MINERALS) tablet Take 1 tablet by mouth daily.    . nitrofurantoin (MACRODANTIN) 100 MG capsule Take 100 mg by mouth 2 (two) times daily.    Marland Kitchen OVER THE COUNTER MEDICATION Take 1 Dose by mouth daily. Over the counter OPC3 and Isochrome powders 1 capful of both mixed in 4 ounces of water daily    . OVER THE COUNTER MEDICATION Take 1 capsule by mouth daily. neuroquell    . OVER THE COUNTER MEDICATION Take 1 tablet by mouth 2 (two) times daily. Public relations account executive    . OVER THE COUNTER MEDICATION Take 1 capsule by mouth daily. VITAL ARTERY CLEANSE    . OVER THE COUNTER MEDICATION Take 1 tablet daily by mouth. Gluco- 800    . OVER THE COUNTER MEDICATION Take 3 tablets daily by mouth. Chelation  Plus Reveratrol    . Potassium Citrate (UROCIT-K 15) 15 MEQ (1620 MG) TBCR Take 1 tablet by mouth 2 (two) times daily.    . Probiotic Product (PROBIOTIC PO) Take 50 mg by mouth daily.     No facility-administered medications prior to visit.     No Known Allergies  ROS As per HPI  PE: Blood pressure 119/78, pulse 81, temperature 98.3 F (36.8 C), temperature source Oral, resp. rate 16, SpO2 98 %.Unable to weigh pt today b/c he is unable to get out of wheelchair. Gen: Alert, well appearing, sitting up in manual wheelchair.  Patient is oriented to person, place, time, and situation. ENT: L EAC 100% cerumen impaction.  R EAC with some cerumen but not concerning for impaction. CV: RRR, no m/r/g.   LUNGS: CTA bilat, nonlabored resps, good aeration in all lung fields. EXT: no clubbing or cyanosis.  He has 1+ pitting edema in RLL, trace in L LL. He is wearing compression stockings.  LABS:  Lab Results  Component Value Date   TSH 1.52 04/16/2014   Lab Results  Component Value Date   WBC 15.2 03/06/2017   HGB 12.5 (A) 03/06/2017   HCT 37 (A) 03/06/2017   MCV 88.4 08/04/2016   PLT 376 03/06/2017   Lab Results  Component Value Date   IRON 81 03/15/2017   IRON 81 03/15/2017   TIBC 285 03/15/2017   FERRITIN 180.6 03/15/2017    Lab Results  Component Value Date   CREATININE 0.39 (L) 03/15/2017   BUN 16 03/15/2017   NA 137 03/15/2017   K 4.0 03/15/2017   CL 99 03/15/2017   CO2 28 03/15/2017   Lab Results  Component Value Date   ALT 30 07/23/2016   AST 24 07/23/2016   ALKPHOS 94 07/23/2016   BILITOT 0.6 07/23/2016   Lab Results  Component Value Date   CHOL 186 03/15/2017   Lab Results  Component Value Date   HDL 37.70 (L) 03/15/2017   Lab Results  Component Value Date   LDLCALC 127 (H) 03/15/2017   Lab Results  Component Value Date   TRIG 104.0 03/15/2017   Lab Results  Component Value Date   CHOLHDL 5 03/15/2017   Lab Results  Component Value Date    PSA 0.69 04/16/2014   PSA 0.49 04/29/2013   Lab Results  Component Value Date   HGBA1C 6.2 06/14/2017   POC HbA1c today= 6.6% IMPRESSION AND PLAN:  1) DM 2, diet controlled. A1c today 6.6%.  No meds recommended at this time. Deferred foot exam  today. Pt is reminded of need for diabetic retinopathy screening exam. We'll repeat metabolic panel and lipids in 6 mo. Feet exam next f/u visit in 3 mo.  2) Cerumen impaction, left. Irrigated by nurse today 100 % clear.  3) ALS: pretty stable since last f/u visit with me. Gets ongoing neurologic specialist care via [facility] + excellent coordination of [place] services, PT/OT, etc through the [facility].  An After Visit Summary was printed and given to the patient.  FOLLOW UP: Return in about 3 months (around 12/19/2017) for routine chronic illness f/u.  Signed:  Crissie Sickles, MD           09/18/2017

## 2017-09-20 ENCOUNTER — Telehealth: Payer: Self-pay | Admitting: *Deleted

## 2017-09-20 DIAGNOSIS — G1221 Amyotrophic lateral sclerosis: Secondary | ICD-10-CM

## 2017-09-20 NOTE — Telephone Encounter (Signed)
Pls order this for him: dx is amyotrophic lateral sclerosis.-thx

## 2017-09-20 NOTE — Telephone Encounter (Signed)
Pt LMOM on 09/20/17 at 2:48pm stating that he was waiting on the [place] to put in the order for [facility] PT but they are taking too long. Pt would like to know if Dr. Anitra Lauth will put in the order to [facility] for [facility] PT. He stated that he was getting PT 3 days a week in the past. Please advise. Thanks.

## 2017-09-21 DIAGNOSIS — N3000 Acute cystitis without hematuria: Secondary | ICD-10-CM | POA: Diagnosis not present

## 2017-09-21 DIAGNOSIS — N200 Calculus of kidney: Secondary | ICD-10-CM | POA: Diagnosis not present

## 2017-09-21 NOTE — Telephone Encounter (Signed)
[facility] PT referral ordered.   Tried calling pt NA and unable to leave a message.

## 2017-09-25 NOTE — Telephone Encounter (Signed)
Pt advised and voiced understanding.   

## 2017-09-28 DIAGNOSIS — K219 Gastro-esophageal reflux disease without esophagitis: Secondary | ICD-10-CM | POA: Diagnosis not present

## 2017-09-28 DIAGNOSIS — G1221 Amyotrophic lateral sclerosis: Secondary | ICD-10-CM | POA: Diagnosis not present

## 2017-09-28 DIAGNOSIS — Z9181 History of falling: Secondary | ICD-10-CM | POA: Diagnosis not present

## 2017-09-28 DIAGNOSIS — M21371 Foot drop, right foot: Secondary | ICD-10-CM | POA: Diagnosis not present

## 2017-09-28 DIAGNOSIS — H8110 Benign paroxysmal vertigo, unspecified ear: Secondary | ICD-10-CM | POA: Diagnosis not present

## 2017-09-28 DIAGNOSIS — M21372 Foot drop, left foot: Secondary | ICD-10-CM | POA: Diagnosis not present

## 2017-09-28 DIAGNOSIS — I10 Essential (primary) hypertension: Secondary | ICD-10-CM | POA: Diagnosis not present

## 2017-09-28 DIAGNOSIS — E119 Type 2 diabetes mellitus without complications: Secondary | ICD-10-CM | POA: Diagnosis not present

## 2017-10-03 DIAGNOSIS — E119 Type 2 diabetes mellitus without complications: Secondary | ICD-10-CM | POA: Diagnosis not present

## 2017-10-03 DIAGNOSIS — H8110 Benign paroxysmal vertigo, unspecified ear: Secondary | ICD-10-CM | POA: Diagnosis not present

## 2017-10-03 DIAGNOSIS — I10 Essential (primary) hypertension: Secondary | ICD-10-CM | POA: Diagnosis not present

## 2017-10-03 DIAGNOSIS — G1221 Amyotrophic lateral sclerosis: Secondary | ICD-10-CM | POA: Diagnosis not present

## 2017-10-03 DIAGNOSIS — M21372 Foot drop, left foot: Secondary | ICD-10-CM | POA: Diagnosis not present

## 2017-10-03 DIAGNOSIS — M21371 Foot drop, right foot: Secondary | ICD-10-CM | POA: Diagnosis not present

## 2017-10-04 DIAGNOSIS — M21372 Foot drop, left foot: Secondary | ICD-10-CM | POA: Diagnosis not present

## 2017-10-04 DIAGNOSIS — H8110 Benign paroxysmal vertigo, unspecified ear: Secondary | ICD-10-CM | POA: Diagnosis not present

## 2017-10-04 DIAGNOSIS — M21371 Foot drop, right foot: Secondary | ICD-10-CM | POA: Diagnosis not present

## 2017-10-04 DIAGNOSIS — G1221 Amyotrophic lateral sclerosis: Secondary | ICD-10-CM | POA: Diagnosis not present

## 2017-10-04 DIAGNOSIS — E119 Type 2 diabetes mellitus without complications: Secondary | ICD-10-CM | POA: Diagnosis not present

## 2017-10-04 DIAGNOSIS — I10 Essential (primary) hypertension: Secondary | ICD-10-CM | POA: Diagnosis not present

## 2017-10-09 DIAGNOSIS — M21372 Foot drop, left foot: Secondary | ICD-10-CM | POA: Diagnosis not present

## 2017-10-09 DIAGNOSIS — G1221 Amyotrophic lateral sclerosis: Secondary | ICD-10-CM | POA: Diagnosis not present

## 2017-10-09 DIAGNOSIS — M21371 Foot drop, right foot: Secondary | ICD-10-CM | POA: Diagnosis not present

## 2017-10-09 DIAGNOSIS — I10 Essential (primary) hypertension: Secondary | ICD-10-CM | POA: Diagnosis not present

## 2017-10-09 DIAGNOSIS — H8110 Benign paroxysmal vertigo, unspecified ear: Secondary | ICD-10-CM | POA: Diagnosis not present

## 2017-10-09 DIAGNOSIS — E119 Type 2 diabetes mellitus without complications: Secondary | ICD-10-CM | POA: Diagnosis not present

## 2017-10-10 DIAGNOSIS — M21371 Foot drop, right foot: Secondary | ICD-10-CM | POA: Diagnosis not present

## 2017-10-10 DIAGNOSIS — H8110 Benign paroxysmal vertigo, unspecified ear: Secondary | ICD-10-CM | POA: Diagnosis not present

## 2017-10-10 DIAGNOSIS — I10 Essential (primary) hypertension: Secondary | ICD-10-CM | POA: Diagnosis not present

## 2017-10-10 DIAGNOSIS — G1221 Amyotrophic lateral sclerosis: Secondary | ICD-10-CM | POA: Diagnosis not present

## 2017-10-10 DIAGNOSIS — M21372 Foot drop, left foot: Secondary | ICD-10-CM | POA: Diagnosis not present

## 2017-10-10 DIAGNOSIS — E119 Type 2 diabetes mellitus without complications: Secondary | ICD-10-CM | POA: Diagnosis not present

## 2017-10-12 DIAGNOSIS — G1221 Amyotrophic lateral sclerosis: Secondary | ICD-10-CM | POA: Diagnosis not present

## 2017-10-12 DIAGNOSIS — E119 Type 2 diabetes mellitus without complications: Secondary | ICD-10-CM | POA: Diagnosis not present

## 2017-10-12 DIAGNOSIS — H8110 Benign paroxysmal vertigo, unspecified ear: Secondary | ICD-10-CM | POA: Diagnosis not present

## 2017-10-12 DIAGNOSIS — I10 Essential (primary) hypertension: Secondary | ICD-10-CM | POA: Diagnosis not present

## 2017-10-12 DIAGNOSIS — M21371 Foot drop, right foot: Secondary | ICD-10-CM | POA: Diagnosis not present

## 2017-10-12 DIAGNOSIS — M21372 Foot drop, left foot: Secondary | ICD-10-CM | POA: Diagnosis not present

## 2017-10-17 DIAGNOSIS — E119 Type 2 diabetes mellitus without complications: Secondary | ICD-10-CM | POA: Diagnosis not present

## 2017-10-17 DIAGNOSIS — G1221 Amyotrophic lateral sclerosis: Secondary | ICD-10-CM | POA: Diagnosis not present

## 2017-10-17 DIAGNOSIS — H8110 Benign paroxysmal vertigo, unspecified ear: Secondary | ICD-10-CM | POA: Diagnosis not present

## 2017-10-17 DIAGNOSIS — M21372 Foot drop, left foot: Secondary | ICD-10-CM | POA: Diagnosis not present

## 2017-10-17 DIAGNOSIS — I10 Essential (primary) hypertension: Secondary | ICD-10-CM | POA: Diagnosis not present

## 2017-10-17 DIAGNOSIS — M21371 Foot drop, right foot: Secondary | ICD-10-CM | POA: Diagnosis not present

## 2017-10-18 DIAGNOSIS — M21371 Foot drop, right foot: Secondary | ICD-10-CM | POA: Diagnosis not present

## 2017-10-18 DIAGNOSIS — H8110 Benign paroxysmal vertigo, unspecified ear: Secondary | ICD-10-CM | POA: Diagnosis not present

## 2017-10-18 DIAGNOSIS — G1221 Amyotrophic lateral sclerosis: Secondary | ICD-10-CM | POA: Diagnosis not present

## 2017-10-18 DIAGNOSIS — I10 Essential (primary) hypertension: Secondary | ICD-10-CM | POA: Diagnosis not present

## 2017-10-18 DIAGNOSIS — M21372 Foot drop, left foot: Secondary | ICD-10-CM | POA: Diagnosis not present

## 2017-10-18 DIAGNOSIS — E119 Type 2 diabetes mellitus without complications: Secondary | ICD-10-CM | POA: Diagnosis not present

## 2017-10-19 DIAGNOSIS — M21372 Foot drop, left foot: Secondary | ICD-10-CM | POA: Diagnosis not present

## 2017-10-19 DIAGNOSIS — I10 Essential (primary) hypertension: Secondary | ICD-10-CM | POA: Diagnosis not present

## 2017-10-19 DIAGNOSIS — M21371 Foot drop, right foot: Secondary | ICD-10-CM | POA: Diagnosis not present

## 2017-10-19 DIAGNOSIS — G1221 Amyotrophic lateral sclerosis: Secondary | ICD-10-CM | POA: Diagnosis not present

## 2017-10-19 DIAGNOSIS — H8110 Benign paroxysmal vertigo, unspecified ear: Secondary | ICD-10-CM | POA: Diagnosis not present

## 2017-10-19 DIAGNOSIS — E119 Type 2 diabetes mellitus without complications: Secondary | ICD-10-CM | POA: Diagnosis not present

## 2017-10-24 DIAGNOSIS — M21372 Foot drop, left foot: Secondary | ICD-10-CM | POA: Diagnosis not present

## 2017-10-24 DIAGNOSIS — M21371 Foot drop, right foot: Secondary | ICD-10-CM | POA: Diagnosis not present

## 2017-10-24 DIAGNOSIS — H8110 Benign paroxysmal vertigo, unspecified ear: Secondary | ICD-10-CM | POA: Diagnosis not present

## 2017-10-24 DIAGNOSIS — I10 Essential (primary) hypertension: Secondary | ICD-10-CM | POA: Diagnosis not present

## 2017-10-24 DIAGNOSIS — E119 Type 2 diabetes mellitus without complications: Secondary | ICD-10-CM | POA: Diagnosis not present

## 2017-10-24 DIAGNOSIS — G1221 Amyotrophic lateral sclerosis: Secondary | ICD-10-CM | POA: Diagnosis not present

## 2017-10-25 DIAGNOSIS — M21372 Foot drop, left foot: Secondary | ICD-10-CM | POA: Diagnosis not present

## 2017-10-25 DIAGNOSIS — E119 Type 2 diabetes mellitus without complications: Secondary | ICD-10-CM | POA: Diagnosis not present

## 2017-10-25 DIAGNOSIS — G1221 Amyotrophic lateral sclerosis: Secondary | ICD-10-CM | POA: Diagnosis not present

## 2017-10-25 DIAGNOSIS — I10 Essential (primary) hypertension: Secondary | ICD-10-CM | POA: Diagnosis not present

## 2017-10-25 DIAGNOSIS — H8110 Benign paroxysmal vertigo, unspecified ear: Secondary | ICD-10-CM | POA: Diagnosis not present

## 2017-10-25 DIAGNOSIS — M21371 Foot drop, right foot: Secondary | ICD-10-CM | POA: Diagnosis not present

## 2017-10-26 DIAGNOSIS — G1221 Amyotrophic lateral sclerosis: Secondary | ICD-10-CM | POA: Diagnosis not present

## 2017-10-26 DIAGNOSIS — M21372 Foot drop, left foot: Secondary | ICD-10-CM | POA: Diagnosis not present

## 2017-10-26 DIAGNOSIS — M21371 Foot drop, right foot: Secondary | ICD-10-CM | POA: Diagnosis not present

## 2017-10-26 DIAGNOSIS — I10 Essential (primary) hypertension: Secondary | ICD-10-CM | POA: Diagnosis not present

## 2017-10-26 DIAGNOSIS — H8110 Benign paroxysmal vertigo, unspecified ear: Secondary | ICD-10-CM | POA: Diagnosis not present

## 2017-10-26 DIAGNOSIS — E119 Type 2 diabetes mellitus without complications: Secondary | ICD-10-CM | POA: Diagnosis not present

## 2017-10-30 DIAGNOSIS — H8110 Benign paroxysmal vertigo, unspecified ear: Secondary | ICD-10-CM | POA: Diagnosis not present

## 2017-10-30 DIAGNOSIS — I10 Essential (primary) hypertension: Secondary | ICD-10-CM | POA: Diagnosis not present

## 2017-10-30 DIAGNOSIS — E119 Type 2 diabetes mellitus without complications: Secondary | ICD-10-CM | POA: Diagnosis not present

## 2017-10-30 DIAGNOSIS — M21371 Foot drop, right foot: Secondary | ICD-10-CM | POA: Diagnosis not present

## 2017-10-30 DIAGNOSIS — M21372 Foot drop, left foot: Secondary | ICD-10-CM | POA: Diagnosis not present

## 2017-10-30 DIAGNOSIS — G1221 Amyotrophic lateral sclerosis: Secondary | ICD-10-CM | POA: Diagnosis not present

## 2017-11-02 DIAGNOSIS — I10 Essential (primary) hypertension: Secondary | ICD-10-CM | POA: Diagnosis not present

## 2017-11-02 DIAGNOSIS — H8110 Benign paroxysmal vertigo, unspecified ear: Secondary | ICD-10-CM | POA: Diagnosis not present

## 2017-11-02 DIAGNOSIS — M21371 Foot drop, right foot: Secondary | ICD-10-CM | POA: Diagnosis not present

## 2017-11-02 DIAGNOSIS — G1221 Amyotrophic lateral sclerosis: Secondary | ICD-10-CM | POA: Diagnosis not present

## 2017-11-02 DIAGNOSIS — E119 Type 2 diabetes mellitus without complications: Secondary | ICD-10-CM | POA: Diagnosis not present

## 2017-11-02 DIAGNOSIS — M21372 Foot drop, left foot: Secondary | ICD-10-CM | POA: Diagnosis not present

## 2017-11-06 ENCOUNTER — Encounter: Payer: Self-pay | Admitting: Family Medicine

## 2017-11-09 DIAGNOSIS — G1221 Amyotrophic lateral sclerosis: Secondary | ICD-10-CM | POA: Diagnosis not present

## 2017-11-09 DIAGNOSIS — E119 Type 2 diabetes mellitus without complications: Secondary | ICD-10-CM | POA: Diagnosis not present

## 2017-11-09 DIAGNOSIS — H8110 Benign paroxysmal vertigo, unspecified ear: Secondary | ICD-10-CM | POA: Diagnosis not present

## 2017-11-09 DIAGNOSIS — M21371 Foot drop, right foot: Secondary | ICD-10-CM | POA: Diagnosis not present

## 2017-11-09 DIAGNOSIS — I10 Essential (primary) hypertension: Secondary | ICD-10-CM | POA: Diagnosis not present

## 2017-11-09 DIAGNOSIS — M21372 Foot drop, left foot: Secondary | ICD-10-CM | POA: Diagnosis not present

## 2017-11-10 DIAGNOSIS — M21371 Foot drop, right foot: Secondary | ICD-10-CM | POA: Diagnosis not present

## 2017-11-10 DIAGNOSIS — H8110 Benign paroxysmal vertigo, unspecified ear: Secondary | ICD-10-CM | POA: Diagnosis not present

## 2017-11-10 DIAGNOSIS — E119 Type 2 diabetes mellitus without complications: Secondary | ICD-10-CM | POA: Diagnosis not present

## 2017-11-10 DIAGNOSIS — G1221 Amyotrophic lateral sclerosis: Secondary | ICD-10-CM | POA: Diagnosis not present

## 2017-11-10 DIAGNOSIS — M21372 Foot drop, left foot: Secondary | ICD-10-CM | POA: Diagnosis not present

## 2017-11-10 DIAGNOSIS — I10 Essential (primary) hypertension: Secondary | ICD-10-CM | POA: Diagnosis not present

## 2017-11-15 DIAGNOSIS — E119 Type 2 diabetes mellitus without complications: Secondary | ICD-10-CM | POA: Diagnosis not present

## 2017-11-15 DIAGNOSIS — M21372 Foot drop, left foot: Secondary | ICD-10-CM | POA: Diagnosis not present

## 2017-11-15 DIAGNOSIS — G1221 Amyotrophic lateral sclerosis: Secondary | ICD-10-CM | POA: Diagnosis not present

## 2017-11-15 DIAGNOSIS — I10 Essential (primary) hypertension: Secondary | ICD-10-CM | POA: Diagnosis not present

## 2017-11-15 DIAGNOSIS — M21371 Foot drop, right foot: Secondary | ICD-10-CM | POA: Diagnosis not present

## 2017-11-15 DIAGNOSIS — H8110 Benign paroxysmal vertigo, unspecified ear: Secondary | ICD-10-CM | POA: Diagnosis not present

## 2017-11-16 DIAGNOSIS — E119 Type 2 diabetes mellitus without complications: Secondary | ICD-10-CM | POA: Diagnosis not present

## 2017-11-16 DIAGNOSIS — G1221 Amyotrophic lateral sclerosis: Secondary | ICD-10-CM | POA: Diagnosis not present

## 2017-11-16 DIAGNOSIS — H8110 Benign paroxysmal vertigo, unspecified ear: Secondary | ICD-10-CM | POA: Diagnosis not present

## 2017-11-16 DIAGNOSIS — M21371 Foot drop, right foot: Secondary | ICD-10-CM | POA: Diagnosis not present

## 2017-11-16 DIAGNOSIS — M21372 Foot drop, left foot: Secondary | ICD-10-CM | POA: Diagnosis not present

## 2017-11-16 DIAGNOSIS — I10 Essential (primary) hypertension: Secondary | ICD-10-CM | POA: Diagnosis not present

## 2017-11-21 DIAGNOSIS — E119 Type 2 diabetes mellitus without complications: Secondary | ICD-10-CM | POA: Diagnosis not present

## 2017-11-21 DIAGNOSIS — M21371 Foot drop, right foot: Secondary | ICD-10-CM | POA: Diagnosis not present

## 2017-11-21 DIAGNOSIS — H8110 Benign paroxysmal vertigo, unspecified ear: Secondary | ICD-10-CM | POA: Diagnosis not present

## 2017-11-21 DIAGNOSIS — I10 Essential (primary) hypertension: Secondary | ICD-10-CM | POA: Diagnosis not present

## 2017-11-21 DIAGNOSIS — G1221 Amyotrophic lateral sclerosis: Secondary | ICD-10-CM | POA: Diagnosis not present

## 2017-11-21 DIAGNOSIS — M21372 Foot drop, left foot: Secondary | ICD-10-CM | POA: Diagnosis not present

## 2017-11-22 DIAGNOSIS — E119 Type 2 diabetes mellitus without complications: Secondary | ICD-10-CM | POA: Diagnosis not present

## 2017-11-22 DIAGNOSIS — M21372 Foot drop, left foot: Secondary | ICD-10-CM | POA: Diagnosis not present

## 2017-11-22 DIAGNOSIS — H8110 Benign paroxysmal vertigo, unspecified ear: Secondary | ICD-10-CM | POA: Diagnosis not present

## 2017-11-22 DIAGNOSIS — G1221 Amyotrophic lateral sclerosis: Secondary | ICD-10-CM | POA: Diagnosis not present

## 2017-11-22 DIAGNOSIS — M21371 Foot drop, right foot: Secondary | ICD-10-CM | POA: Diagnosis not present

## 2017-11-22 DIAGNOSIS — I10 Essential (primary) hypertension: Secondary | ICD-10-CM | POA: Diagnosis not present

## 2017-11-23 DIAGNOSIS — M21372 Foot drop, left foot: Secondary | ICD-10-CM | POA: Diagnosis not present

## 2017-11-23 DIAGNOSIS — G1221 Amyotrophic lateral sclerosis: Secondary | ICD-10-CM | POA: Diagnosis not present

## 2017-11-23 DIAGNOSIS — E119 Type 2 diabetes mellitus without complications: Secondary | ICD-10-CM | POA: Diagnosis not present

## 2017-11-23 DIAGNOSIS — M21371 Foot drop, right foot: Secondary | ICD-10-CM | POA: Diagnosis not present

## 2017-11-23 DIAGNOSIS — I10 Essential (primary) hypertension: Secondary | ICD-10-CM | POA: Diagnosis not present

## 2017-11-23 DIAGNOSIS — H8110 Benign paroxysmal vertigo, unspecified ear: Secondary | ICD-10-CM | POA: Diagnosis not present

## 2017-11-27 DIAGNOSIS — Z9181 History of falling: Secondary | ICD-10-CM | POA: Diagnosis not present

## 2017-11-27 DIAGNOSIS — I10 Essential (primary) hypertension: Secondary | ICD-10-CM | POA: Diagnosis not present

## 2017-11-27 DIAGNOSIS — E119 Type 2 diabetes mellitus without complications: Secondary | ICD-10-CM | POA: Diagnosis not present

## 2017-11-27 DIAGNOSIS — K219 Gastro-esophageal reflux disease without esophagitis: Secondary | ICD-10-CM | POA: Diagnosis not present

## 2017-11-27 DIAGNOSIS — G1221 Amyotrophic lateral sclerosis: Secondary | ICD-10-CM | POA: Diagnosis not present

## 2017-11-27 DIAGNOSIS — M21372 Foot drop, left foot: Secondary | ICD-10-CM | POA: Diagnosis not present

## 2017-11-27 DIAGNOSIS — M21371 Foot drop, right foot: Secondary | ICD-10-CM | POA: Diagnosis not present

## 2017-11-27 DIAGNOSIS — H8110 Benign paroxysmal vertigo, unspecified ear: Secondary | ICD-10-CM | POA: Diagnosis not present

## 2017-11-28 DIAGNOSIS — I10 Essential (primary) hypertension: Secondary | ICD-10-CM | POA: Diagnosis not present

## 2017-11-28 DIAGNOSIS — M21371 Foot drop, right foot: Secondary | ICD-10-CM | POA: Diagnosis not present

## 2017-11-28 DIAGNOSIS — M21372 Foot drop, left foot: Secondary | ICD-10-CM | POA: Diagnosis not present

## 2017-11-28 DIAGNOSIS — E119 Type 2 diabetes mellitus without complications: Secondary | ICD-10-CM | POA: Diagnosis not present

## 2017-11-28 DIAGNOSIS — H8110 Benign paroxysmal vertigo, unspecified ear: Secondary | ICD-10-CM | POA: Diagnosis not present

## 2017-11-28 DIAGNOSIS — G1221 Amyotrophic lateral sclerosis: Secondary | ICD-10-CM | POA: Diagnosis not present

## 2017-11-29 DIAGNOSIS — M21372 Foot drop, left foot: Secondary | ICD-10-CM | POA: Diagnosis not present

## 2017-11-29 DIAGNOSIS — E119 Type 2 diabetes mellitus without complications: Secondary | ICD-10-CM | POA: Diagnosis not present

## 2017-11-29 DIAGNOSIS — I10 Essential (primary) hypertension: Secondary | ICD-10-CM | POA: Diagnosis not present

## 2017-11-29 DIAGNOSIS — G1221 Amyotrophic lateral sclerosis: Secondary | ICD-10-CM | POA: Diagnosis not present

## 2017-11-29 DIAGNOSIS — M21371 Foot drop, right foot: Secondary | ICD-10-CM | POA: Diagnosis not present

## 2017-11-29 DIAGNOSIS — H8110 Benign paroxysmal vertigo, unspecified ear: Secondary | ICD-10-CM | POA: Diagnosis not present

## 2017-11-30 DIAGNOSIS — I10 Essential (primary) hypertension: Secondary | ICD-10-CM | POA: Diagnosis not present

## 2017-11-30 DIAGNOSIS — M21371 Foot drop, right foot: Secondary | ICD-10-CM | POA: Diagnosis not present

## 2017-11-30 DIAGNOSIS — M21372 Foot drop, left foot: Secondary | ICD-10-CM | POA: Diagnosis not present

## 2017-11-30 DIAGNOSIS — E119 Type 2 diabetes mellitus without complications: Secondary | ICD-10-CM | POA: Diagnosis not present

## 2017-11-30 DIAGNOSIS — H8110 Benign paroxysmal vertigo, unspecified ear: Secondary | ICD-10-CM | POA: Diagnosis not present

## 2017-11-30 DIAGNOSIS — G1221 Amyotrophic lateral sclerosis: Secondary | ICD-10-CM | POA: Diagnosis not present

## 2017-12-05 DIAGNOSIS — I10 Essential (primary) hypertension: Secondary | ICD-10-CM | POA: Diagnosis not present

## 2017-12-05 DIAGNOSIS — M21372 Foot drop, left foot: Secondary | ICD-10-CM | POA: Diagnosis not present

## 2017-12-05 DIAGNOSIS — H8110 Benign paroxysmal vertigo, unspecified ear: Secondary | ICD-10-CM | POA: Diagnosis not present

## 2017-12-05 DIAGNOSIS — M21371 Foot drop, right foot: Secondary | ICD-10-CM | POA: Diagnosis not present

## 2017-12-05 DIAGNOSIS — G1221 Amyotrophic lateral sclerosis: Secondary | ICD-10-CM | POA: Diagnosis not present

## 2017-12-05 DIAGNOSIS — E119 Type 2 diabetes mellitus without complications: Secondary | ICD-10-CM | POA: Diagnosis not present

## 2017-12-06 DIAGNOSIS — E119 Type 2 diabetes mellitus without complications: Secondary | ICD-10-CM | POA: Diagnosis not present

## 2017-12-06 DIAGNOSIS — M21372 Foot drop, left foot: Secondary | ICD-10-CM | POA: Diagnosis not present

## 2017-12-06 DIAGNOSIS — I10 Essential (primary) hypertension: Secondary | ICD-10-CM | POA: Diagnosis not present

## 2017-12-06 DIAGNOSIS — H8110 Benign paroxysmal vertigo, unspecified ear: Secondary | ICD-10-CM | POA: Diagnosis not present

## 2017-12-06 DIAGNOSIS — G1221 Amyotrophic lateral sclerosis: Secondary | ICD-10-CM | POA: Diagnosis not present

## 2017-12-06 DIAGNOSIS — M21371 Foot drop, right foot: Secondary | ICD-10-CM | POA: Diagnosis not present

## 2017-12-06 LAB — BASIC METABOLIC PANEL
CREATININE: 0.7 (ref <=1.3)
GLUCOSE: 239
Potassium: 1.3 — AB (ref 3.4–5.3)
SODIUM: 134 — AB (ref 137–147)

## 2017-12-06 LAB — CBC AND DIFFERENTIAL
HCT: 43 (ref 41–53)
Hemoglobin: 13.9 (ref 13.5–17.5)
Platelets: 232 (ref 150–399)
WBC: 9.2

## 2017-12-06 LAB — HEPATIC FUNCTION PANEL
ALK PHOS: 84 (ref 25–125)
ALT: 33 (ref 10–40)
AST: 18 (ref 14–40)
BILIRUBIN, TOTAL: 0.6

## 2017-12-06 LAB — HEMOGLOBIN A1C: Hemoglobin A1C: 7.7

## 2017-12-06 LAB — VITAMIN D 25 HYDROXY (VIT D DEFICIENCY, FRACTURES): Vit D, 25-Hydroxy: 57.13

## 2017-12-06 LAB — HM HEPATITIS C SCREENING LAB: HM Hepatitis Screen: NEGATIVE

## 2017-12-07 DIAGNOSIS — H8110 Benign paroxysmal vertigo, unspecified ear: Secondary | ICD-10-CM | POA: Diagnosis not present

## 2017-12-07 DIAGNOSIS — E119 Type 2 diabetes mellitus without complications: Secondary | ICD-10-CM | POA: Diagnosis not present

## 2017-12-07 DIAGNOSIS — M21371 Foot drop, right foot: Secondary | ICD-10-CM | POA: Diagnosis not present

## 2017-12-07 DIAGNOSIS — I10 Essential (primary) hypertension: Secondary | ICD-10-CM | POA: Diagnosis not present

## 2017-12-07 DIAGNOSIS — G1221 Amyotrophic lateral sclerosis: Secondary | ICD-10-CM | POA: Diagnosis not present

## 2017-12-07 DIAGNOSIS — M21372 Foot drop, left foot: Secondary | ICD-10-CM | POA: Diagnosis not present

## 2017-12-11 ENCOUNTER — Encounter: Payer: Self-pay | Admitting: Family Medicine

## 2017-12-12 ENCOUNTER — Ambulatory Visit (INDEPENDENT_AMBULATORY_CARE_PROVIDER_SITE_OTHER): Payer: Medicare Other | Admitting: Sports Medicine

## 2017-12-12 ENCOUNTER — Encounter: Payer: Self-pay | Admitting: Sports Medicine

## 2017-12-12 DIAGNOSIS — M79675 Pain in left toe(s): Secondary | ICD-10-CM

## 2017-12-12 DIAGNOSIS — M79674 Pain in right toe(s): Secondary | ICD-10-CM | POA: Diagnosis not present

## 2017-12-12 DIAGNOSIS — I739 Peripheral vascular disease, unspecified: Secondary | ICD-10-CM

## 2017-12-12 DIAGNOSIS — B351 Tinea unguium: Secondary | ICD-10-CM | POA: Diagnosis not present

## 2017-12-12 DIAGNOSIS — G1221 Amyotrophic lateral sclerosis: Secondary | ICD-10-CM

## 2017-12-12 DIAGNOSIS — L84 Corns and callosities: Secondary | ICD-10-CM

## 2017-12-12 NOTE — Progress Notes (Signed)
Subjective: Ethan Rose is a 72 y.o. male patient seen today in office with complaint of painful thickened and elongated toenails; unable to trim and callus check to heels and toes that has been drying up and scabbing. Patient has no other pedal complaints at this time.   Reports he got a new wheelchair and that he is on medication for ALS.   Patient Active Problem List   Diagnosis Date Noted  . ALS (amyotrophic lateral sclerosis) ([place]) 06/14/2017  . Lumbar radiculopathy 11/29/2016  . Hypocalcemia 10/26/2016  . Ureteral stone with hydronephrosis 08/10/2016  . Debility 07/22/2016  . Pressure ulcer 07/18/2016  . E coli bacteremia   . Leukocytosis   . Pyelonephritis 07/16/2016  . Left ureteral stone 07/16/2016  . Cervical myelopathy ([place]) 03/18/2016  . 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 Other malaise and fatigue 05/14/2014  . Health maintenance examination 04/29/2013  . Weakness of both legs 04/29/2013  . Leg cramps 04/29/2013  . Elevated blood pressure reading without diagnosis of hypertension 03/30/2012  . BPPV (benign paroxysmal positional vertigo) 03/30/2012    Current Outpatient Medications on File Prior to Visit  Medication Sig Dispense Refill  . alfuzosin (UROXATRAL) 10 MG 24 hr tablet Take 10 mg by mouth at bedtime.     . Cholecalciferol (VITAMIN D3) 5000 units CAPS Take 1 capsule by mouth daily.    . Menaquinone-7 (VITAMIN K2 PO) Take 1 tablet by mouth daily.     . Multiple Vitamins-Minerals (MULTIVITAMIN WITH MINERALS) tablet Take 1 tablet by mouth daily.    . nitrofurantoin (MACRODANTIN) 100 MG capsule Take 100 mg by mouth 2 (two) times daily.    Marland Kitchen OVER THE COUNTER MEDICATION Take 1 Dose by mouth daily. Over the counter OPC3 and Isochrome powders 1 capful of both mixed in 4 ounces of water daily    . OVER THE COUNTER MEDICATION Take 1 capsule by mouth  daily. neuroquell    . OVER THE COUNTER MEDICATION Take 1 tablet by mouth 2 (two) times daily. Public relations account executive    . OVER THE COUNTER MEDICATION Take 1 capsule by mouth daily. VITAL ARTERY CLEANSE    . OVER THE COUNTER MEDICATION Take 1 tablet daily by mouth. Gluco- 800    . OVER THE COUNTER MEDICATION Take 3 tablets daily by mouth. Chelation Plus Reveratrol    . Potassium Citrate (UROCIT-K 15) 15 MEQ (1620 MG) TBCR Take 1 tablet by mouth 2 (two) times daily.    . Probiotic Product (PROBIOTIC PO) Take 50 mg by mouth daily.    . riluzole (RILUTEK) 50 MG tablet Take 50 mg by mouth every 12 (twelve) hours. Per [facility] ALS specialist     No current facility-administered medications on file prior to visit.     No Known Allergies  Objective: Physical Exam  General: Well developed, nourished, no acute distress, awake, alert and oriented x 3 in wheelchair assisted by brother  Dermatological: Skin is warm, dry, and supple bilateral, Nails 2-5 bilateral are tender, long, thick, and discolored with mild subungal debris, Bilateral hallux nail procedure sites are well healed, no webspace macerations present bilateral, + pre-ulcerative lesions at 2nd toes bilateral and dry skin to heels, no open lesions present bilateral. No signs of infection bilateral.   Neurovascular status: Intact. Chronic disuse lower extremity swelling; No pain with calf compression bilateral.  Musculoskeletal: + swan-neck hammertoes bilateral 2nd toes, no tenderness to bilateral  hallux nail sites, Muscular strength 3/5 bilateral uses wheelchair   Assessment and Plan:  Problem List Items Addressed This Visit      Nervous and Auditory   ALS (amyotrophic lateral sclerosis) ([place])    Other Visit Diagnoses    Pre-ulcerative calluses    -  Primary   Dermatophytosis of nail       Pain in toes of both feet       PVD (peripheral vascular disease) ([place])         -Examined patient.  -Discussed treatment options for painful mycotic nails  and dry skin/callus. -Mechanically debrided and reduced mycotic nails with sterile nail nipper and dremel nail file without incident. -Advised patient to avoid tight socks that could irritate the toes  -Recommend Vit A&D to heels and offloading when in bed -Patient to return in 3 months for follow up evaluation or sooner if symptoms worsen.  Landis Martins, DPM

## 2017-12-13 DIAGNOSIS — M21371 Foot drop, right foot: Secondary | ICD-10-CM | POA: Diagnosis not present

## 2017-12-13 DIAGNOSIS — H8110 Benign paroxysmal vertigo, unspecified ear: Secondary | ICD-10-CM | POA: Diagnosis not present

## 2017-12-13 DIAGNOSIS — G1221 Amyotrophic lateral sclerosis: Secondary | ICD-10-CM | POA: Diagnosis not present

## 2017-12-13 DIAGNOSIS — I10 Essential (primary) hypertension: Secondary | ICD-10-CM | POA: Diagnosis not present

## 2017-12-13 DIAGNOSIS — E119 Type 2 diabetes mellitus without complications: Secondary | ICD-10-CM | POA: Diagnosis not present

## 2017-12-13 DIAGNOSIS — M21372 Foot drop, left foot: Secondary | ICD-10-CM | POA: Diagnosis not present

## 2017-12-14 DIAGNOSIS — M21371 Foot drop, right foot: Secondary | ICD-10-CM | POA: Diagnosis not present

## 2017-12-14 DIAGNOSIS — E119 Type 2 diabetes mellitus without complications: Secondary | ICD-10-CM | POA: Diagnosis not present

## 2017-12-14 DIAGNOSIS — M21372 Foot drop, left foot: Secondary | ICD-10-CM | POA: Diagnosis not present

## 2017-12-14 DIAGNOSIS — H8110 Benign paroxysmal vertigo, unspecified ear: Secondary | ICD-10-CM | POA: Diagnosis not present

## 2017-12-14 DIAGNOSIS — G1221 Amyotrophic lateral sclerosis: Secondary | ICD-10-CM | POA: Diagnosis not present

## 2017-12-14 DIAGNOSIS — I10 Essential (primary) hypertension: Secondary | ICD-10-CM | POA: Diagnosis not present

## 2017-12-19 DIAGNOSIS — H8110 Benign paroxysmal vertigo, unspecified ear: Secondary | ICD-10-CM | POA: Diagnosis not present

## 2017-12-19 DIAGNOSIS — M21372 Foot drop, left foot: Secondary | ICD-10-CM | POA: Diagnosis not present

## 2017-12-19 DIAGNOSIS — N200 Calculus of kidney: Secondary | ICD-10-CM | POA: Diagnosis not present

## 2017-12-19 DIAGNOSIS — M21371 Foot drop, right foot: Secondary | ICD-10-CM | POA: Diagnosis not present

## 2017-12-19 DIAGNOSIS — I10 Essential (primary) hypertension: Secondary | ICD-10-CM | POA: Diagnosis not present

## 2017-12-19 DIAGNOSIS — N3000 Acute cystitis without hematuria: Secondary | ICD-10-CM | POA: Diagnosis not present

## 2017-12-19 DIAGNOSIS — G1221 Amyotrophic lateral sclerosis: Secondary | ICD-10-CM | POA: Diagnosis not present

## 2017-12-19 DIAGNOSIS — E119 Type 2 diabetes mellitus without complications: Secondary | ICD-10-CM | POA: Diagnosis not present

## 2017-12-20 DIAGNOSIS — M21372 Foot drop, left foot: Secondary | ICD-10-CM | POA: Diagnosis not present

## 2017-12-20 DIAGNOSIS — G1221 Amyotrophic lateral sclerosis: Secondary | ICD-10-CM | POA: Diagnosis not present

## 2017-12-20 DIAGNOSIS — M21371 Foot drop, right foot: Secondary | ICD-10-CM | POA: Diagnosis not present

## 2017-12-20 DIAGNOSIS — H8110 Benign paroxysmal vertigo, unspecified ear: Secondary | ICD-10-CM | POA: Diagnosis not present

## 2017-12-20 DIAGNOSIS — E119 Type 2 diabetes mellitus without complications: Secondary | ICD-10-CM | POA: Diagnosis not present

## 2017-12-20 DIAGNOSIS — I10 Essential (primary) hypertension: Secondary | ICD-10-CM | POA: Diagnosis not present

## 2017-12-21 ENCOUNTER — Encounter: Payer: Self-pay | Admitting: Family Medicine

## 2017-12-21 ENCOUNTER — Ambulatory Visit (INDEPENDENT_AMBULATORY_CARE_PROVIDER_SITE_OTHER): Payer: Medicare Other | Admitting: Family Medicine

## 2017-12-21 VITALS — BP 121/73 | HR 91 | Temp 98.2°F | Resp 16

## 2017-12-21 DIAGNOSIS — L84 Corns and callosities: Secondary | ICD-10-CM | POA: Diagnosis not present

## 2017-12-21 DIAGNOSIS — M21372 Foot drop, left foot: Secondary | ICD-10-CM | POA: Diagnosis not present

## 2017-12-21 DIAGNOSIS — E119 Type 2 diabetes mellitus without complications: Secondary | ICD-10-CM | POA: Diagnosis not present

## 2017-12-21 DIAGNOSIS — G1221 Amyotrophic lateral sclerosis: Secondary | ICD-10-CM | POA: Diagnosis not present

## 2017-12-21 DIAGNOSIS — I10 Essential (primary) hypertension: Secondary | ICD-10-CM | POA: Diagnosis not present

## 2017-12-21 DIAGNOSIS — M21371 Foot drop, right foot: Secondary | ICD-10-CM | POA: Diagnosis not present

## 2017-12-21 DIAGNOSIS — H8110 Benign paroxysmal vertigo, unspecified ear: Secondary | ICD-10-CM | POA: Diagnosis not present

## 2017-12-21 MED ORDER — MUPIROCIN 2 % EX OINT: 1.0000 "application " | TOPICAL_OINTMENT | Freq: Two times a day (BID) | CUTANEOUS | 1 refills | Status: DC

## 2017-12-21 NOTE — Progress Notes (Signed)
OFFICE VISIT  12/21/2017   CC:  Chief Complaint  Ethan Rose presents with  . Follow-up    RCI   HPI:    Ethan Rose is a 72 y.o. Caucasian male who presents for 3 mo recent dx diabetes. He initially had improvement in A1c with diet alone, then A1c went up the next check-- A1c here 09/2018 was 6.6 %.  We planned to start oral diabetic med if A1c went over 7%. Then through an outside provider HbA1c was 7.7% on 12/06/17 (2 weeks ago).  He remains severely debilitated and wheelchair bound due to ALS.  HTN: highest home bp 140-150/90, but most readings 120s/80s.  DM:  He does not have a glucometer at home and doesn't really want one at this time. He admits he has gotten a bit off of diabetic diet.  Wants, to avoid meds, get back on strict diet at this time. No burning, tingling, or numbness in feet.  Past Medical History:  Diagnosis Date  . Allergic rhinitis   . Amyotrophic lateral sclerosis (ALS) ([place]) 05/2017   Dr. Scarlette Slice Caress--[employer] ALS clinic.  . At high risk for falls   . Bilateral leg weakness    uses wheelchair  . BPH with obstruction/lower urinary tract symptoms   . BPPV (benign paroxysmal positional vertigo) 03/30/2012  . Carpal tunnel syndrome   . Cervical myelopathy ([place])   . Chronic low back pain   . Degenerative spinal arthritis   . Diabetes mellitus without complication ([place]) 2297  . Essential hypertension   . Foot drop, bilateral    multiple back sx's  (does use AFO's , but does have them)  . GERD (gastroesophageal reflux disease)   . History of cardiac murmur as a child   . History of gout    left hand  . History of kidney stones    since 1997  . History of sepsis    09/ 2017 urosepsis  . History of squamous cell carcinoma excision    right thigh 03/ 2016  . Inability to walk    since sepsis 09/ 2017 per pt left him severely debilitated due to weakness  . Olecranon bursitis 01-05-2016   Dr. Percell Miller  . Renal calculi    bilateral   .  Slow transit constipation   . Spondylolisthesis of lumbar region   . Uses wheelchair    [phone] septic shock -- residual bilateral leg weakness w/ inability to walk  . Vitamin D deficiency     Past Surgical History:  Procedure Laterality Date  . ABDOMINAL EXPOSURE N/A 03/24/2015   Procedure: ABDOMINAL EXPOSURE;  Surgeon: [name], MD;  Location: [facility] NEURO ORS;  Service: Vascular;  Laterality: N/A;  left side approach  . ANTERIOR CERVICAL DECOMP/DISCECTOMY FUSION N/A 03/18/2016   Procedure: Cervical four-five Cervical five-six Cervical six-seven Anterior cervical decompression/diskectomy/fusion;  Surgeon: [name], MD;  Location: [place] NEURO ORS;  Service: Neurosurgery;  Laterality: N/A;  n  . ANTERIOR LAT LUMBAR FUSION Right 03/24/2015   Procedure: Right Lumbar two-Three,Lumbar Three-Four,Lumbar Four-Five Anterior lateral lumbar interbody fusion ;  Surgeon: [name], MD;  Location: [place] NEURO ORS;  Service: Neurosurgery;  Laterality: Right;  right  . COLONOSCOPY  approx 2011   Dr. [name] (normal per pt report)  . CYSTOSCOPY WITH RETROGRADE PYELOGRAM, URETEROSCOPY AND STENT PLACEMENT Left 08/15/2016   Procedure: CYSTOSCOPY WITH STENT REMOVAL,  RETROGRADES, URETEROSCOPY, STONE BASKETRY AND STENT REPLACEMENT;  Surgeon: [name], MD;  Location: [facility] ORS;  Service:  Urology;  Laterality: Left;  . CYSTOSCOPY WITH STENT PLACEMENT Left 07/16/2016   Procedure: CYSTOSCOPY, RETROGRADE WITH LEFT URETERAL STENT PLACEMENT;  Surgeon: [name], MD;  Location: [facility] ORS;  Service: Urology;  Laterality: Left;  . CYSTOSCOPY WITH STENT PLACEMENT Bilateral 02/27/2017   Procedure: CYSTOSCOPY WITH STENT PLACEMENT;  Surgeon: [name], MD;  Location: [facility];  Service: Urology;  Laterality: Bilateral;  . CYSTOSCOPY/RETROGRADE/URETEROSCOPY/STONE EXTRACTION WITH BASKET Bilateral 02/27/2017   Procedure: CYSTOSCOPY/RETROGRADE/URETEROSCOPY/STONE EXTRACTION WITH BASKET;   Surgeon: [name], MD;  Location: [facility];  Service: Urology;  Laterality: Bilateral;  . HOLMIUM LASER APPLICATION Left 34/11/9620   Procedure: HOLMIUM LASER;  Surgeon: [name], MD;  Location: [facility] ORS;  Service: Urology;  Laterality: Left;  . INGUINAL HERNIA REPAIR Bilateral 1993  . POSTERIOR LUMBAR FUSION 4 LEVEL N/A 03/27/2015   Procedure: Posterior pedicle subtraction osteotomy with T10 to Iliac fusion with Dr. [name] assisting;  Surgeon: [name], MD;  Location: [facility] NEURO ORS;  Service: Neurosurgery;  Laterality: N/A;  Posterior pedicle subtraction osteotomy with T10 to Iliac fusion with Dr. [name] assisting  . TONSILLECTOMY  1954    Outpatient Medications Prior to Visit  Medication Sig Dispense Refill  . alfuzosin (UROXATRAL) 10 MG 24 hr tablet Take 10 mg by mouth at bedtime.     . Cholecalciferol (VITAMIN D3) 5000 units CAPS Take 1 capsule by mouth daily.    . Menaquinone-7 (VITAMIN K2 PO) Take 1 tablet by mouth daily.     . Multiple Vitamins-Minerals (MULTIVITAMIN WITH MINERALS) tablet Take 1 tablet by mouth daily.    . nitrofurantoin (MACRODANTIN) 100 MG capsule Take 50 mg by mouth daily.     Marland Kitchen OVER THE COUNTER MEDICATION Take 1 Dose by mouth daily. Over the counter OPC3 and Isochrome powders 1 capful of both mixed in 4 ounces of water daily    . OVER THE COUNTER MEDICATION Take 1 capsule by mouth daily. neuroquell    . OVER THE COUNTER MEDICATION Take 1 tablet by mouth 2 (two) times daily. Public relations account executive    . OVER THE COUNTER MEDICATION Take 1 capsule by mouth daily. VITAL ARTERY CLEANSE    . OVER THE COUNTER MEDICATION Take 1 tablet daily by mouth. Gluco- 800    . OVER THE COUNTER MEDICATION Take 3 tablets daily by mouth. Chelation Plus Reveratrol    . Potassium Citrate (UROCIT-K 15) 15 MEQ (1620 MG) TBCR Take 1 tablet by mouth 2 (two) times daily.    . Probiotic Product (PROBIOTIC PO) Take 50 mg by mouth daily.    . riluzole  (RILUTEK) 50 MG tablet Take 50 mg by mouth every 12 (twelve) hours. Per [facility] ALS specialist     No facility-administered medications prior to visit.     No Known Allergies  ROS As per HPI  PE: Blood pressure 121/73, pulse 91, temperature 98.2 F (36.8 C), temperature source Oral, resp. rate 16, SpO2 96 %. Gen: Alert, well appearing.  Ethan Rose is oriented to person, place, time, and situation. AFFECT: pleasant, lucid thought and speech. Feet: bilat 4+ pitting edema, decreased ROM, flexed arch deformity, with PIPs becoming more prominently displaced upward.  Pre-ulcerative calluses on PIPs of 2nd toe of both feet, pinkish discoloration, tiny drainage hole in center of L one.  No tenderness.  No fluctuance.  He can wiggle toes a bit.  Sensation intact to monofilament testing bilat.  LABS:  Lab Results  Component Value Date  TSH 1.52 04/16/2014   Lab Results  Component Value Date   WBC 9.2 12/06/2017   HGB 13.9 12/06/2017   HCT 43 12/06/2017   MCV 88.4 08/04/2016   PLT 232 12/06/2017   Lab Results  Component Value Date   CREATININE 0.7 12/06/2017   BUN 16 03/15/2017   NA 134 (A) 12/06/2017   K 1.3 (A) 12/06/2017   CL 99 03/15/2017   CO2 28 03/15/2017  Suspect K above was 3.1--abstracted incorrectly.  Lab Results  Component Value Date   ALT 33 12/06/2017   AST 18 12/06/2017   ALKPHOS 84 12/06/2017   BILITOT 0.6 07/23/2016   Lab Results  Component Value Date   CHOL 186 03/15/2017   Lab Results  Component Value Date   HDL 37.70 (L) 03/15/2017   Lab Results  Component Value Date   LDLCALC 127 (H) 03/15/2017   Lab Results  Component Value Date   TRIG 104.0 03/15/2017   Lab Results  Component Value Date   CHOLHDL 5 03/15/2017   Lab Results  Component Value Date   PSA 0.69 04/16/2014   PSA 0.49 04/29/2013   Lab Results  Component Value Date   HGBA1C 7.7 12/06/2017    IMPRESSION AND PLAN:  DM 2.  Most recent A1c up to 7.7%. HbA1c not due at this  time: will be due in about 3 mo. Feet exam today: sensation intact.  Pre-ulcerative calluses: apply bactroban ointment bid, zinc oxide bid. Signs/symptoms to call or return for were reviewed and pt expressed understanding. No glucometer at this time per pt preference, but if HbA1c is rising again at next check in 3 mo we'll get him set up with glucometer +/- start med.  No labs needed today.  An After Visit Summary was printed and given to the Ethan Rose.  FOLLOW UP: Return in about 3 months (around 03/20/2018) for routine chronic illness f/u.Repeat A1c at that time.  Signed:  Crissie Sickles, MD           12/21/2017

## 2017-12-22 DIAGNOSIS — M21372 Foot drop, left foot: Secondary | ICD-10-CM | POA: Diagnosis not present

## 2017-12-22 DIAGNOSIS — M21371 Foot drop, right foot: Secondary | ICD-10-CM | POA: Diagnosis not present

## 2017-12-22 DIAGNOSIS — H8110 Benign paroxysmal vertigo, unspecified ear: Secondary | ICD-10-CM | POA: Diagnosis not present

## 2017-12-22 DIAGNOSIS — I10 Essential (primary) hypertension: Secondary | ICD-10-CM | POA: Diagnosis not present

## 2017-12-22 DIAGNOSIS — G1221 Amyotrophic lateral sclerosis: Secondary | ICD-10-CM | POA: Diagnosis not present

## 2017-12-22 DIAGNOSIS — E119 Type 2 diabetes mellitus without complications: Secondary | ICD-10-CM | POA: Diagnosis not present

## 2017-12-25 ENCOUNTER — Encounter: Payer: Self-pay | Admitting: Family Medicine

## 2017-12-26 DIAGNOSIS — H8110 Benign paroxysmal vertigo, unspecified ear: Secondary | ICD-10-CM | POA: Diagnosis not present

## 2017-12-26 DIAGNOSIS — E119 Type 2 diabetes mellitus without complications: Secondary | ICD-10-CM | POA: Diagnosis not present

## 2017-12-26 DIAGNOSIS — M21371 Foot drop, right foot: Secondary | ICD-10-CM | POA: Diagnosis not present

## 2017-12-26 DIAGNOSIS — M21372 Foot drop, left foot: Secondary | ICD-10-CM | POA: Diagnosis not present

## 2017-12-26 DIAGNOSIS — G1221 Amyotrophic lateral sclerosis: Secondary | ICD-10-CM | POA: Diagnosis not present

## 2017-12-26 DIAGNOSIS — I10 Essential (primary) hypertension: Secondary | ICD-10-CM | POA: Diagnosis not present

## 2017-12-27 DIAGNOSIS — M21371 Foot drop, right foot: Secondary | ICD-10-CM | POA: Diagnosis not present

## 2017-12-27 DIAGNOSIS — E119 Type 2 diabetes mellitus without complications: Secondary | ICD-10-CM | POA: Diagnosis not present

## 2017-12-27 DIAGNOSIS — G1221 Amyotrophic lateral sclerosis: Secondary | ICD-10-CM | POA: Diagnosis not present

## 2017-12-27 DIAGNOSIS — H8110 Benign paroxysmal vertigo, unspecified ear: Secondary | ICD-10-CM | POA: Diagnosis not present

## 2017-12-27 DIAGNOSIS — M21372 Foot drop, left foot: Secondary | ICD-10-CM | POA: Diagnosis not present

## 2017-12-27 DIAGNOSIS — I10 Essential (primary) hypertension: Secondary | ICD-10-CM | POA: Diagnosis not present

## 2017-12-28 DIAGNOSIS — G1221 Amyotrophic lateral sclerosis: Secondary | ICD-10-CM | POA: Diagnosis not present

## 2017-12-28 DIAGNOSIS — I10 Essential (primary) hypertension: Secondary | ICD-10-CM | POA: Diagnosis not present

## 2017-12-28 DIAGNOSIS — H8110 Benign paroxysmal vertigo, unspecified ear: Secondary | ICD-10-CM | POA: Diagnosis not present

## 2017-12-28 DIAGNOSIS — M21371 Foot drop, right foot: Secondary | ICD-10-CM | POA: Diagnosis not present

## 2017-12-28 DIAGNOSIS — M21372 Foot drop, left foot: Secondary | ICD-10-CM | POA: Diagnosis not present

## 2017-12-28 DIAGNOSIS — E119 Type 2 diabetes mellitus without complications: Secondary | ICD-10-CM | POA: Diagnosis not present

## 2018-01-02 DIAGNOSIS — H8110 Benign paroxysmal vertigo, unspecified ear: Secondary | ICD-10-CM | POA: Diagnosis not present

## 2018-01-02 DIAGNOSIS — M21372 Foot drop, left foot: Secondary | ICD-10-CM | POA: Diagnosis not present

## 2018-01-02 DIAGNOSIS — E119 Type 2 diabetes mellitus without complications: Secondary | ICD-10-CM | POA: Diagnosis not present

## 2018-01-02 DIAGNOSIS — I10 Essential (primary) hypertension: Secondary | ICD-10-CM | POA: Diagnosis not present

## 2018-01-02 DIAGNOSIS — M21371 Foot drop, right foot: Secondary | ICD-10-CM | POA: Diagnosis not present

## 2018-01-02 DIAGNOSIS — G1221 Amyotrophic lateral sclerosis: Secondary | ICD-10-CM | POA: Diagnosis not present

## 2018-01-03 DIAGNOSIS — G1221 Amyotrophic lateral sclerosis: Secondary | ICD-10-CM | POA: Diagnosis not present

## 2018-01-03 DIAGNOSIS — M21371 Foot drop, right foot: Secondary | ICD-10-CM | POA: Diagnosis not present

## 2018-01-03 DIAGNOSIS — E119 Type 2 diabetes mellitus without complications: Secondary | ICD-10-CM | POA: Diagnosis not present

## 2018-01-03 DIAGNOSIS — I10 Essential (primary) hypertension: Secondary | ICD-10-CM | POA: Diagnosis not present

## 2018-01-03 DIAGNOSIS — M21372 Foot drop, left foot: Secondary | ICD-10-CM | POA: Diagnosis not present

## 2018-01-03 DIAGNOSIS — H8110 Benign paroxysmal vertigo, unspecified ear: Secondary | ICD-10-CM | POA: Diagnosis not present

## 2018-01-04 DIAGNOSIS — I10 Essential (primary) hypertension: Secondary | ICD-10-CM | POA: Diagnosis not present

## 2018-01-04 DIAGNOSIS — E119 Type 2 diabetes mellitus without complications: Secondary | ICD-10-CM | POA: Diagnosis not present

## 2018-01-04 DIAGNOSIS — M21372 Foot drop, left foot: Secondary | ICD-10-CM | POA: Diagnosis not present

## 2018-01-04 DIAGNOSIS — M21371 Foot drop, right foot: Secondary | ICD-10-CM | POA: Diagnosis not present

## 2018-01-04 DIAGNOSIS — H8110 Benign paroxysmal vertigo, unspecified ear: Secondary | ICD-10-CM | POA: Diagnosis not present

## 2018-01-04 DIAGNOSIS — G1221 Amyotrophic lateral sclerosis: Secondary | ICD-10-CM | POA: Diagnosis not present

## 2018-01-09 DIAGNOSIS — E119 Type 2 diabetes mellitus without complications: Secondary | ICD-10-CM | POA: Diagnosis not present

## 2018-01-09 DIAGNOSIS — I10 Essential (primary) hypertension: Secondary | ICD-10-CM | POA: Diagnosis not present

## 2018-01-09 DIAGNOSIS — M21372 Foot drop, left foot: Secondary | ICD-10-CM | POA: Diagnosis not present

## 2018-01-09 DIAGNOSIS — H8110 Benign paroxysmal vertigo, unspecified ear: Secondary | ICD-10-CM | POA: Diagnosis not present

## 2018-01-09 DIAGNOSIS — G1221 Amyotrophic lateral sclerosis: Secondary | ICD-10-CM | POA: Diagnosis not present

## 2018-01-09 DIAGNOSIS — M21371 Foot drop, right foot: Secondary | ICD-10-CM | POA: Diagnosis not present

## 2018-01-10 DIAGNOSIS — M21371 Foot drop, right foot: Secondary | ICD-10-CM | POA: Diagnosis not present

## 2018-01-10 DIAGNOSIS — I10 Essential (primary) hypertension: Secondary | ICD-10-CM | POA: Diagnosis not present

## 2018-01-10 DIAGNOSIS — M21372 Foot drop, left foot: Secondary | ICD-10-CM | POA: Diagnosis not present

## 2018-01-10 DIAGNOSIS — G1221 Amyotrophic lateral sclerosis: Secondary | ICD-10-CM | POA: Diagnosis not present

## 2018-01-10 DIAGNOSIS — H8110 Benign paroxysmal vertigo, unspecified ear: Secondary | ICD-10-CM | POA: Diagnosis not present

## 2018-01-10 DIAGNOSIS — E119 Type 2 diabetes mellitus without complications: Secondary | ICD-10-CM | POA: Diagnosis not present

## 2018-01-11 DIAGNOSIS — G1221 Amyotrophic lateral sclerosis: Secondary | ICD-10-CM | POA: Diagnosis not present

## 2018-01-11 DIAGNOSIS — H8110 Benign paroxysmal vertigo, unspecified ear: Secondary | ICD-10-CM | POA: Diagnosis not present

## 2018-01-11 DIAGNOSIS — E119 Type 2 diabetes mellitus without complications: Secondary | ICD-10-CM | POA: Diagnosis not present

## 2018-01-11 DIAGNOSIS — M21371 Foot drop, right foot: Secondary | ICD-10-CM | POA: Diagnosis not present

## 2018-01-11 DIAGNOSIS — M21372 Foot drop, left foot: Secondary | ICD-10-CM | POA: Diagnosis not present

## 2018-01-11 DIAGNOSIS — I10 Essential (primary) hypertension: Secondary | ICD-10-CM | POA: Diagnosis not present

## 2018-01-16 DIAGNOSIS — E119 Type 2 diabetes mellitus without complications: Secondary | ICD-10-CM | POA: Diagnosis not present

## 2018-01-16 DIAGNOSIS — M21372 Foot drop, left foot: Secondary | ICD-10-CM | POA: Diagnosis not present

## 2018-01-16 DIAGNOSIS — M21371 Foot drop, right foot: Secondary | ICD-10-CM | POA: Diagnosis not present

## 2018-01-16 DIAGNOSIS — G1221 Amyotrophic lateral sclerosis: Secondary | ICD-10-CM | POA: Diagnosis not present

## 2018-01-16 DIAGNOSIS — H8110 Benign paroxysmal vertigo, unspecified ear: Secondary | ICD-10-CM | POA: Diagnosis not present

## 2018-01-16 DIAGNOSIS — I10 Essential (primary) hypertension: Secondary | ICD-10-CM | POA: Diagnosis not present

## 2018-01-17 DIAGNOSIS — H8110 Benign paroxysmal vertigo, unspecified ear: Secondary | ICD-10-CM | POA: Diagnosis not present

## 2018-01-17 DIAGNOSIS — M21371 Foot drop, right foot: Secondary | ICD-10-CM | POA: Diagnosis not present

## 2018-01-17 DIAGNOSIS — M21372 Foot drop, left foot: Secondary | ICD-10-CM | POA: Diagnosis not present

## 2018-01-17 DIAGNOSIS — E119 Type 2 diabetes mellitus without complications: Secondary | ICD-10-CM | POA: Diagnosis not present

## 2018-01-17 DIAGNOSIS — G1221 Amyotrophic lateral sclerosis: Secondary | ICD-10-CM | POA: Diagnosis not present

## 2018-01-17 DIAGNOSIS — I10 Essential (primary) hypertension: Secondary | ICD-10-CM | POA: Diagnosis not present

## 2018-01-22 DIAGNOSIS — E119 Type 2 diabetes mellitus without complications: Secondary | ICD-10-CM | POA: Diagnosis not present

## 2018-01-22 DIAGNOSIS — M21372 Foot drop, left foot: Secondary | ICD-10-CM | POA: Diagnosis not present

## 2018-01-22 DIAGNOSIS — G1221 Amyotrophic lateral sclerosis: Secondary | ICD-10-CM | POA: Diagnosis not present

## 2018-01-22 DIAGNOSIS — H8110 Benign paroxysmal vertigo, unspecified ear: Secondary | ICD-10-CM | POA: Diagnosis not present

## 2018-01-22 DIAGNOSIS — I10 Essential (primary) hypertension: Secondary | ICD-10-CM | POA: Diagnosis not present

## 2018-01-22 DIAGNOSIS — M21371 Foot drop, right foot: Secondary | ICD-10-CM | POA: Diagnosis not present

## 2018-01-24 DIAGNOSIS — G1221 Amyotrophic lateral sclerosis: Secondary | ICD-10-CM | POA: Diagnosis not present

## 2018-01-24 DIAGNOSIS — M21371 Foot drop, right foot: Secondary | ICD-10-CM | POA: Diagnosis not present

## 2018-01-24 DIAGNOSIS — I10 Essential (primary) hypertension: Secondary | ICD-10-CM | POA: Diagnosis not present

## 2018-01-24 DIAGNOSIS — H8110 Benign paroxysmal vertigo, unspecified ear: Secondary | ICD-10-CM | POA: Diagnosis not present

## 2018-01-24 DIAGNOSIS — M21372 Foot drop, left foot: Secondary | ICD-10-CM | POA: Diagnosis not present

## 2018-01-24 DIAGNOSIS — E119 Type 2 diabetes mellitus without complications: Secondary | ICD-10-CM | POA: Diagnosis not present

## 2018-01-25 DIAGNOSIS — G1221 Amyotrophic lateral sclerosis: Secondary | ICD-10-CM | POA: Diagnosis not present

## 2018-01-25 DIAGNOSIS — I10 Essential (primary) hypertension: Secondary | ICD-10-CM | POA: Diagnosis not present

## 2018-01-25 DIAGNOSIS — H8110 Benign paroxysmal vertigo, unspecified ear: Secondary | ICD-10-CM | POA: Diagnosis not present

## 2018-01-25 DIAGNOSIS — E119 Type 2 diabetes mellitus without complications: Secondary | ICD-10-CM | POA: Diagnosis not present

## 2018-01-25 DIAGNOSIS — M21372 Foot drop, left foot: Secondary | ICD-10-CM | POA: Diagnosis not present

## 2018-01-25 DIAGNOSIS — M21371 Foot drop, right foot: Secondary | ICD-10-CM | POA: Diagnosis not present

## 2018-03-13 ENCOUNTER — Ambulatory Visit: Payer: Medicare Other | Admitting: Sports Medicine

## 2018-03-13 ENCOUNTER — Ambulatory Visit (INDEPENDENT_AMBULATORY_CARE_PROVIDER_SITE_OTHER): Payer: Medicare Other | Admitting: Podiatry

## 2018-03-13 ENCOUNTER — Encounter: Payer: Self-pay | Admitting: Podiatry

## 2018-03-13 DIAGNOSIS — B351 Tinea unguium: Secondary | ICD-10-CM | POA: Diagnosis not present

## 2018-03-13 DIAGNOSIS — M79674 Pain in right toe(s): Secondary | ICD-10-CM

## 2018-03-13 DIAGNOSIS — G1221 Amyotrophic lateral sclerosis: Secondary | ICD-10-CM

## 2018-03-13 DIAGNOSIS — M79675 Pain in left toe(s): Secondary | ICD-10-CM

## 2018-03-13 DIAGNOSIS — I739 Peripheral vascular disease, unspecified: Secondary | ICD-10-CM

## 2018-03-13 NOTE — Progress Notes (Signed)
Complaint:  Visit Type: Patient returns to my office for continued preventative foot care services. Complaint: Patient states" my nails have grown long and thick and become painful to walk and wear shoes" ALS with pvd..  Patient is in a wheelchair.. The patient presents for preventative foot care services. No changes to ROS  Podiatric Exam: Vascular: dorsalis pedis and posterior tibial pulses are not  palpable bilateral. Cold feet. Skin turgor WNL  Sensorium: deferred. Nail Exam: Pt has thick disfigured discolored nails with subungual debris noted bilateral entire nail hallux through fifth toenails Ulcer Exam: There is no evidence of ulcer or pre-ulcerative changes or infection. Orthopedic Exam: Muscle tone and strength are WNL. No limitations in general ROM. No crepitus or effusions noted. Foot type and digits show no abnormalities. Bony prominences are unremarkable. Skin: No Porokeratosis. No infection or ulcers  Diagnosis:  Onychomycosis, , Pain in right toe, pain in left toes  Treatment & Plan Procedures and Treatment: Consent by patient was obtained for treatment procedures.   Debridement of mycotic and hypertrophic toenails, 1 through 5 bilateral and clearing of subungual debris. No ulceration, no infection noted.  Return Visit-Office Procedure: Patient instructed to return to the office for a follow up visit 3 months for continued evaluation and treatment.    Kaelyn Nauta DPM 

## 2018-03-16 ENCOUNTER — Encounter: Payer: Self-pay | Admitting: Family Medicine

## 2018-03-16 ENCOUNTER — Ambulatory Visit: Payer: Medicare Other | Admitting: Family Medicine

## 2018-03-16 ENCOUNTER — Ambulatory Visit (INDEPENDENT_AMBULATORY_CARE_PROVIDER_SITE_OTHER): Payer: Medicare Other | Admitting: Family Medicine

## 2018-03-16 VITALS — BP 138/86 | HR 78 | Temp 98.6°F | Resp 16

## 2018-03-16 DIAGNOSIS — G1221 Amyotrophic lateral sclerosis: Secondary | ICD-10-CM

## 2018-03-16 DIAGNOSIS — Z8679 Personal history of other diseases of the circulatory system: Secondary | ICD-10-CM | POA: Diagnosis not present

## 2018-03-16 DIAGNOSIS — E119 Type 2 diabetes mellitus without complications: Secondary | ICD-10-CM | POA: Diagnosis not present

## 2018-03-16 LAB — POCT GLYCOSYLATED HEMOGLOBIN (HGB A1C): Hemoglobin A1C: 7.8

## 2018-03-16 NOTE — Progress Notes (Signed)
OFFICE VISIT  03/16/2018   CC:  Chief Complaint  Patient presents with  . Follow-up    RCI    HPI:    Patient is a 72 y.o. Caucasian male who presents accompanied by his brother for 3 mo f/u DM 2.   DM 2: Initially did good control with diet only.  Then, last check his A1c went up to 7.7%. He really wants to avoid diabetic meds, so we agreed that he would get much more strict with his diabetic diet and see if his A1c goes back down around 7% or stays stable. No home glucose monitoring---he chooses not to have a glucometer. He is severely disabled/impaired mobility due to ALS and is wheel-chair bound---no exercise is possible.  Trying to make some changes to diet but does find it hard.  He has monitored his bp in the past for hx of mild HTN: last visit these had been consistently normal. Home: bp consistently 120-130/70-mid 80s.    At most recent neurology f/u at [facility] on 01/31/18 he had a normal CMET.  Continues to get gradually weaker in arms/legs.  Respiratory/breathing monitoring is showing that this is fine. No swallowing dysfunction.  Next neuro f/u is 07/2018. [facility].  Past Medical History:  Diagnosis Date  . Allergic rhinitis   . Amyotrophic lateral sclerosis (ALS) ([place]) 05/2017   Dr. Scarlette Slice Caress--[facility].  . At high risk for falls   . Bilateral leg weakness    uses wheelchair  . BPH with obstruction/lower urinary tract symptoms   . BPPV (benign paroxysmal positional vertigo) 03/30/2012  . Carpal tunnel syndrome   . Cervical myelopathy ([place])   . Chronic low back pain   . Degenerative spinal arthritis   . Diabetes mellitus without complication ([place]) 1093  . Essential hypertension   . Foot drop, bilateral    multiple back sx's  (does use AFO's , but does have them)  . GERD (gastroesophageal reflux disease)   . History of cardiac murmur as a child   . History of gout    left hand  . History of kidney  stones    since 1997  . History of sepsis    09/ 2017 urosepsis  . History of squamous cell carcinoma excision    right thigh 03/ 2016  . Inability to walk    dx'd with ALS  . Olecranon bursitis 11/17/2015   Dr. Percell Miller  . Renal calculi    bilateral   . Slow transit constipation   . Spondylolisthesis of lumbar region   . Uses wheelchair    [phone] septic shock -- residual bilateral leg weakness w/ inability to walk  . Vitamin D deficiency     Past Surgical History:  Procedure Laterality Date  . ABDOMINAL EXPOSURE N/A 03/24/2015   Procedure: ABDOMINAL EXPOSURE;  Surgeon: [name], MD;  Location: [facility];  Service: Vascular;  Laterality: N/A;  left side approach  . ANTERIOR CERVICAL DECOMP/DISCECTOMY FUSION N/A 03/18/2016   Procedure: Cervical four-five Cervical five-six Cervical six-seven Anterior cervical decompression/diskectomy/fusion;  Surgeon: [name], MD;  Location: [facility];  Service: Neurosurgery;  Laterality: N/A;  n  . ANTERIOR LAT LUMBAR FUSION Right 03/24/2015   Procedure: Right Lumbar two-Three,Lumbar Three-Four,Lumbar Four-Five Anterior lateral lumbar interbody fusion ;  Surgeon: [name], MD;  Location: [facility];  Service: Neurosurgery;  Laterality: Right;  right  . COLONOSCOPY  approx 2011  Dr. [name] (normal per pt report)  . CYSTOSCOPY WITH RETROGRADE PYELOGRAM, URETEROSCOPY AND STENT PLACEMENT Left 08/15/2016   Procedure: CYSTOSCOPY WITH STENT REMOVAL,  RETROGRADES, URETEROSCOPY, STONE BASKETRY AND STENT REPLACEMENT;  Surgeon: [name], MD;  Location: [facility];  Service: Urology;  Laterality: Left;  . CYSTOSCOPY WITH STENT PLACEMENT Left 07/16/2016   Procedure: CYSTOSCOPY, RETROGRADE WITH LEFT URETERAL STENT PLACEMENT;  Surgeon: [name], MD;  Location: [facility];  Service: Urology;  Laterality: Left;  . CYSTOSCOPY WITH STENT PLACEMENT Bilateral 02/27/2017   Procedure: CYSTOSCOPY WITH STENT PLACEMENT;  Surgeon: [name], MD;  Location: [facility];  Service: Urology;  Laterality: Bilateral;  . CYSTOSCOPY/RETROGRADE/URETEROSCOPY/STONE EXTRACTION WITH BASKET Bilateral 02/27/2017   Procedure: CYSTOSCOPY/RETROGRADE/URETEROSCOPY/STONE EXTRACTION WITH BASKET;  Surgeon: [name], MD;  Location: [facility];  Service: Urology;  Laterality: Bilateral;  . HOLMIUM LASER APPLICATION Left 60/11/930   Procedure: HOLMIUM LASER;  Surgeon: [name], MD;  Location: [facility];  Service: Urology;  Laterality: Left;  . INGUINAL HERNIA REPAIR Bilateral 1993  . POSTERIOR LUMBAR FUSION 4 LEVEL N/A 03/27/2015   Procedure: Posterior pedicle subtraction osteotomy with T10 to Iliac fusion with Dr. [name] assisting;  Surgeon: [name], MD;  [facility];  Service: Neurosurgery;  Laterality: N/A;  Posterior pedicle subtraction osteotomy with T10 to Iliac fusion with Dr. [name] assisting  . TONSILLECTOMY  1954    Outpatient Medications Prior to Visit  Medication Sig Dispense Refill  . alfuzosin (UROXATRAL) 10 MG 24 hr tablet Take 10 mg by mouth at bedtime.     Jolyne Loa Grape-Goldenseal (BERBERINE COMPLEX PO) Take 1 tablet by mouth 3 (three) times daily.    . Cholecalciferol (VITAMIN D3) 5000 units CAPS Take 1 capsule by mouth daily.    . Menaquinone-7 (VITAMIN K2 PO) Take 1 tablet by mouth daily.     . Multiple Vitamins-Minerals (MULTIVITAMIN WITH MINERALS) tablet Take 1 tablet by mouth daily.    . mupirocin ointment (BACTROBAN) 2 % Apply 1 application topically 2 (two) times daily. 15 g 1  . nitrofurantoin (MACRODANTIN) 100 MG capsule Take 50 mg by mouth daily.     Marland Kitchen OVER THE COUNTER MEDICATION Take 1 Dose by mouth daily. Over the counter OPC3 and Isochrome powders 1 capful of both mixed in 4 ounces of water daily    . OVER THE COUNTER MEDICATION Take 1 tablet by mouth 2 (two) times daily. Public relations account executive    . OVER THE COUNTER MEDICATION Take 1 capsule by  mouth daily. VITAL ARTERY CLEANSE    . OVER THE COUNTER MEDICATION Take 3 tablets daily by mouth. Chelation Plus Reveratrol    . OVER THE COUNTER MEDICATION Take 3 capsules by mouth daily. Diabetic Nerve Regenerator    . OVER THE COUNTER MEDICATION Take 1 tablet by mouth 2 (two) times daily. D-Mannos    . Potassium Citrate (UROCIT-K 15) 15 MEQ (1620 MG) TBCR Take 1 tablet by mouth 2 (two) times daily.    . Probiotic Product (PROBIOTIC PO) Take 50 mg by mouth daily.    . riluzole (RILUTEK) 50 MG tablet Take 50 mg by mouth every 12 (twelve) hours. Per [facility] ALS specialist    . OVER THE COUNTER MEDICATION Take 1 capsule by mouth daily. neuroquell    . OVER THE COUNTER MEDICATION Take 1 tablet daily by mouth. Gluco- 800     No facility-administered medications prior to visit.  No Known Allergies  ROS As per HPI  PE: Blood pressure 138/86, pulse 78, temperature 98.6 F (37 C), temperature source Oral, resp. rate 16, SpO2 95 %. Gen: Alert, well appearing.  Patient is oriented to person, place, time, and situation. AFFECT: pleasant, lucid thought and speech. Sitting in wheelchair. RPR:XYVO: no injection, icteris, swelling, or exudate.  EOMI, PERRLA. Mouth: lips without lesion/swelling.  Oral mucosa pink and moist. Oropharynx without erythema, exudate, or swelling.  CV: RRR, no m/r/g.   LUNGS: CTA bilat, nonlabored resps, good aeration in all lung fields.   LABS:  Lab Results  Component Value Date   TSH 1.52 04/16/2014   Lab Results  Component Value Date   WBC 9.2 12/06/2017   HGB 13.9 12/06/2017   HCT 43 12/06/2017   MCV 88.4 08/04/2016   PLT 232 12/06/2017   Lab Results  Component Value Date   CREATININE 0.7 12/06/2017   BUN 16 03/15/2017   NA 134 (A) 12/06/2017   K 1.3 (A) 12/06/2017   CL 99 03/15/2017   CO2 28 03/15/2017   Lab Results  Component Value Date   ALT 33 12/06/2017   AST 18 12/06/2017   ALKPHOS 84 12/06/2017   BILITOT 0.6 07/23/2016   Lab Results   Component Value Date   CHOL 186 03/15/2017   Lab Results  Component Value Date   HDL 37.70 (L) 03/15/2017   Lab Results  Component Value Date   LDLCALC 127 (H) 03/15/2017   Lab Results  Component Value Date   TRIG 104.0 03/15/2017   Lab Results  Component Value Date   CHOLHDL 5 03/15/2017   Lab Results  Component Value Date   PSA 0.69 04/16/2014   PSA 0.49 04/29/2013   Lab Results  Component Value Date   HGBA1C 7.8 03/16/2018   POC HbA1c: 7.8%  IMPRESSION AND PLAN:  1) DM 2, stable.  POC A1c today 7.8%, essentially unchanged. Goal A1c for him is 7 to 8%. If it gets >8% then we'll start him on med. He'll continue to make improvements in effort to eat a healthier/diabetic diet. He has declined statin.  2) Hx of HTN: doing well on no meds.  Has rare reading up into 140s/90s.  Vast majority of bp readings are well wnl. Lytes/cr normal at neurologist's last f/u visit 01/31/18.  3) ALS: slowly progressive, but doing well from a CV/PULM standpoint. Next neurologist f/u is 07/2018.  An After Visit Summary was printed and given to the patient.  FOLLOW UP: Return in about 3 months (around 06/16/2018) for routine chronic illness f/u.  Signed:  Crissie Sickles, MD           03/16/2018

## 2018-03-22 DIAGNOSIS — N3000 Acute cystitis without hematuria: Secondary | ICD-10-CM | POA: Diagnosis not present

## 2018-03-22 DIAGNOSIS — N200 Calculus of kidney: Secondary | ICD-10-CM | POA: Diagnosis not present

## 2018-04-04 DIAGNOSIS — H8110 Benign paroxysmal vertigo, unspecified ear: Secondary | ICD-10-CM | POA: Diagnosis not present

## 2018-04-04 DIAGNOSIS — G1221 Amyotrophic lateral sclerosis: Secondary | ICD-10-CM | POA: Diagnosis not present

## 2018-04-04 DIAGNOSIS — E119 Type 2 diabetes mellitus without complications: Secondary | ICD-10-CM | POA: Diagnosis not present

## 2018-04-04 DIAGNOSIS — I10 Essential (primary) hypertension: Secondary | ICD-10-CM | POA: Diagnosis not present

## 2018-04-04 DIAGNOSIS — K219 Gastro-esophageal reflux disease without esophagitis: Secondary | ICD-10-CM | POA: Diagnosis not present

## 2018-04-04 DIAGNOSIS — M21372 Foot drop, left foot: Secondary | ICD-10-CM | POA: Diagnosis not present

## 2018-04-04 DIAGNOSIS — M21371 Foot drop, right foot: Secondary | ICD-10-CM | POA: Diagnosis not present

## 2018-05-01 ENCOUNTER — Telehealth: Payer: Self-pay | Admitting: Family Medicine

## 2018-05-01 DIAGNOSIS — R309 Painful micturition, unspecified: Secondary | ICD-10-CM | POA: Diagnosis not present

## 2018-05-01 NOTE — Telephone Encounter (Unsigned)
Copied from [place] [id number]. Topic: Quick Communication - See Telephone Encounter >> May 01, 2018 12:17 PM Neva Seat wrote: [place] - [phone]  Pt has had a recent UTI - Pt is immobile.   Mr Brayton El is needing an order to have pt's sample taken to a lab for testing. Please call Mr Brayton El for ok.

## 2018-05-01 NOTE — Telephone Encounter (Signed)
Please advise. Thanks.  

## 2018-05-01 NOTE — Telephone Encounter (Signed)
OK--order is fine.

## 2018-05-01 NOTE — Telephone Encounter (Signed)
Phone call returned to Louann Sjogren, unable to leave message, voice mail was full. Will try to call back at later time.

## 2018-05-03 NOTE — Telephone Encounter (Signed)
Detailed message left on voicemail.

## 2018-06-12 ENCOUNTER — Encounter: Payer: Self-pay | Admitting: Sports Medicine

## 2018-06-12 ENCOUNTER — Ambulatory Visit (INDEPENDENT_AMBULATORY_CARE_PROVIDER_SITE_OTHER): Payer: Medicare Other | Admitting: Sports Medicine

## 2018-06-12 DIAGNOSIS — M79675 Pain in left toe(s): Secondary | ICD-10-CM | POA: Diagnosis not present

## 2018-06-12 DIAGNOSIS — I739 Peripheral vascular disease, unspecified: Secondary | ICD-10-CM | POA: Diagnosis not present

## 2018-06-12 DIAGNOSIS — G1221 Amyotrophic lateral sclerosis: Secondary | ICD-10-CM

## 2018-06-12 DIAGNOSIS — B351 Tinea unguium: Secondary | ICD-10-CM | POA: Diagnosis not present

## 2018-06-12 DIAGNOSIS — M79674 Pain in right toe(s): Secondary | ICD-10-CM | POA: Diagnosis not present

## 2018-06-12 NOTE — Progress Notes (Signed)
Subjective: Ethan Rose is a 72 y.o. male patient seen today in office with complaint of painful thickened and elongated toenails; unable to trim. Patient has no other pedal complaints at this time. Still taking meds for ALS and in motorized chair.   Patient Active Problem List   Diagnosis Date Noted  . ALS (amyotrophic lateral sclerosis) ([place]) 06/14/2017  . Lumbar radiculopathy 11/29/2016  . Hypocalcemia 10/26/2016  . Ureteral stone with hydronephrosis 08/10/2016  . Debility 07/22/2016  . Pressure ulcer 07/18/2016  . E coli bacteremia   . Leukocytosis   . Pyelonephritis 07/16/2016  . Left ureteral stone 07/16/2016  . Cervical myelopathy ([place]) 03/18/2016  . 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 Other malaise and fatigue 05/14/2014  . Health maintenance examination 04/29/2013  . Weakness of both legs 04/29/2013  . Leg cramps 04/29/2013  . Elevated blood pressure reading without diagnosis of hypertension 03/30/2012  . BPPV (benign paroxysmal positional vertigo) 03/30/2012    Current Outpatient Medications on File Prior to Visit  Medication Sig Dispense Refill  . alfuzosin (UROXATRAL) 10 MG 24 hr tablet Take 10 mg by mouth at bedtime.     Jolyne Loa Grape-Goldenseal (BERBERINE COMPLEX PO) Take 1 tablet by mouth 3 (three) times daily.    . Cholecalciferol (VITAMIN D3) 5000 units CAPS Take 1 capsule by mouth daily.    . Menaquinone-7 (VITAMIN K2 PO) Take 1 tablet by mouth daily.     . Multiple Vitamins-Minerals (MULTIVITAMIN WITH MINERALS) tablet Take 1 tablet by mouth daily.    . mupirocin ointment (BACTROBAN) 2 % Apply 1 application topically 2 (two) times daily. 15 g 1  . nitrofurantoin (MACRODANTIN) 100 MG capsule Take 50 mg by mouth daily.     Marland Kitchen OVER THE COUNTER MEDICATION Take 1 Dose by mouth daily. Over the counter OPC3 and Isochrome powders 1 capful of  both mixed in 4 ounces of water daily    . OVER THE COUNTER MEDICATION Take 1 capsule by mouth daily. neuroquell    . OVER THE COUNTER MEDICATION Take 1 tablet by mouth 2 (two) times daily. Public relations account executive    . OVER THE COUNTER MEDICATION Take 1 capsule by mouth daily. VITAL ARTERY CLEANSE    . OVER THE COUNTER MEDICATION Take 1 tablet daily by mouth. Gluco- 800    . OVER THE COUNTER MEDICATION Take 3 tablets daily by mouth. Chelation Plus Reveratrol    . OVER THE COUNTER MEDICATION Take 3 capsules by mouth daily. Diabetic Nerve Regenerator    . OVER THE COUNTER MEDICATION Take 1 tablet by mouth 2 (two) times daily. D-Mannos    . Potassium Citrate (UROCIT-K 15) 15 MEQ (1620 MG) TBCR Take 1 tablet by mouth 2 (two) times daily.    . Probiotic Product (PROBIOTIC PO) Take 50 mg by mouth daily.    . riluzole (RILUTEK) 50 MG tablet Take 50 mg by mouth every 12 (twelve) hours. Per [facility] ALS specialist     No current facility-administered medications on file prior to visit.     No Known Allergies  Objective: Physical Exam  General: Well developed, nourished, no acute distress, awake, alert and oriented x 3 in wheelchair assisted by brother  Dermatological: Skin is warm, dry, and supple bilateral, Nails 2-5 bilateral are tender, long, thick, and discolored with mild subungal debris, Bilateral hallux nails are mildly thickened distally,  no webspace macerations present bilateral, + pre-ulcerative  lesions at 2nd toes bilateral and dry skin to heels, no open lesions present bilateral. No signs of infection bilateral.   Neurovascular status: Intact. Chronic disuse lower extremity swelling; No pain with calf compression bilateral.  Musculoskeletal: + swan-neck hammertoes bilateral 2nd toes, no tenderness to bilateral hallux nail sites, Muscular strength 3/5 bilateral uses wheelchair   Assessment and Plan:  Problem List Items Addressed This Visit      Nervous and Auditory   ALS (amyotrophic lateral  sclerosis) ([place])    Other Visit Diagnoses    Pain due to onychomycosis of toenails of both feet    -  Primary   PVD (peripheral vascular disease) ([place])         -Examined patient.  -Discussed treatment options for painful mycotic nails and dry skin/callus. -Mechanically debrided and reduced mycotic nails with sterile nail nipper and dremel nail file without incident. -Patient to return in 3 months for follow up evaluation or sooner if symptoms worsen.  Landis Martins, DPM

## 2018-06-15 NOTE — Progress Notes (Signed)
Subjective:   Ethan Rose is a 72 y.o. male who presents for an Initial Medicare Annual Wellness Visit. Accompanied by daughter, Ethan Rose.  Review of Systems  No ROS.  Medicare Wellness Visit. Additional risk factors are reflected in the social history.  Cardiac Risk Factors include: diabetes mellitus;advanced age (>85men, >51 women);male gender;sedentary lifestyle;family history of premature cardiovascular disease   Sleep patterns: Sleeps 8 hours.  Home Safety/Smoke Alarms: Feels safe in home. Smoke alarms in place.  Living environment; residence and Firearm Safety: Lives alone in 2 story home, ramps at doors. Having doors widened, lift placed in bathroom.  Seat Belt Safety/Bike Helmet: Wears seat belt.    Male:   CCS-11/16/2009. Cologuard, 09/09/2016, negative.   PSA-  Lab Results  Component Value Date   PSA 0.69 04/16/2014   PSA 0.49 04/29/2013      Objective:    Today's Vitals   06/18/18 1410  BP: 118/60  Pulse: 97  Resp: 16  SpO2: 94%   There is no height or weight on file to calculate BMI.  Advanced Directives 06/18/2018 06/14/2017 02/27/2017 02/22/2017 11/29/2016 08/24/2016 08/12/2016  Does Patient Have a Medical Advance Directive? Yes Yes Yes Yes Yes Yes Yes  Type of Paramedic of Ethan Rose;Living will Healthcare Power of Attorney Living will;Healthcare Power of Attorney Living will Living will Ethan Rose;Living will Ethan Rose;Living will  Does patient want to make changes to medical advance directive? - - No - Patient declined - - - -  Copy of [place] in Chart? No - copy requested - No - copy requested - - - No - copy requested  Would patient like information on creating a medical advance directive? - - - - - - -    Current Medications (verified) Outpatient Encounter Medications as of 06/18/2018  Medication Sig  . alfuzosin (UROXATRAL) 10 MG 24 hr tablet Take 10 mg by mouth at  bedtime.   Jolyne Loa Grape-Goldenseal (BERBERINE COMPLEX PO) Take 1 tablet by mouth 3 (three) times daily.  . Cholecalciferol (VITAMIN D3) 5000 units CAPS Take 1 capsule by mouth daily.  . Menaquinone-7 (VITAMIN K2 PO) Take 1 tablet by mouth daily.   . Multiple Vitamins-Minerals (MULTIVITAMIN WITH MINERALS) tablet Take 1 tablet by mouth daily.  . nitrofurantoin (MACRODANTIN) 100 MG capsule Take 50 mg by mouth daily.   Marland Kitchen OVER THE COUNTER MEDICATION Take 1 Dose by mouth daily. Over the counter Ethan Rose and Isochrome powders 1 capful of both mixed in 4 ounces of water daily  . OVER THE COUNTER MEDICATION Take 1 tablet by mouth 2 (two) times daily. Public relations account executive  . OVER THE COUNTER MEDICATION Take 1 capsule by mouth daily. VITAL ARTERY CLEANSE  . OVER THE COUNTER MEDICATION Take 3 tablets daily by mouth. Chelation Plus Reveratrol  . OVER THE COUNTER MEDICATION Take 3 capsules by mouth daily. Diabetic Nerve Regenerator  . OVER THE COUNTER MEDICATION Take 1 tablet by mouth 2 (two) times daily. D-Mannos  . Potassium Citrate (UROCIT-K 15) 15 MEQ (1620 MG) TBCR Take 1 tablet by mouth 2 (two) times daily.  . Probiotic Product (PROBIOTIC PO) Take 50 mg by mouth daily.  . riluzole (RILUTEK) 50 MG tablet Take 50 mg by mouth every 12 (twelve) hours. Per [facility] ALS specialist  . OVER THE COUNTER MEDICATION Take 1 capsule by mouth daily. neuroquell  . OVER THE COUNTER MEDICATION Take 1 tablet daily by mouth. Gluco- 800  . [DISCONTINUED] mupirocin  ointment (BACTROBAN) 2 % Apply 1 application topically 2 (two) times daily. (Patient not taking: Reported on 06/18/2018)   No facility-administered encounter medications on file as of 06/18/2018.     Allergies (verified) Patient has no known allergies.   History: Past Medical History:  Diagnosis Date  . Allergic rhinitis   . Amyotrophic lateral sclerosis (ALS) ([place]) 05/2017   Dr. Scarlette Slice Caress--[employer] ALS clinic.  . At high risk for falls   .  Bilateral leg weakness    uses wheelchair  . BPH with obstruction/lower urinary tract symptoms   . BPPV (benign paroxysmal positional vertigo) 03/30/2012  . Carpal tunnel syndrome   . Cervical myelopathy ([place])   . Chronic low back pain   . Degenerative spinal arthritis   . Diabetes mellitus without complication ([place]) 4401  . Essential hypertension    hx of : bp good off meds 2018/2019  . Foot drop, bilateral    multiple back sx's  (does use AFO's , but does have them)  . GERD (gastroesophageal reflux disease)   . History of cardiac murmur as a child   . History of gout    left hand  . History of kidney stones    since 1997  . History of sepsis    09/ 2017 urosepsis  . History of squamous cell carcinoma excision    right thigh 03/ 2016  . Inability to walk    dx'd with ALS  . Olecranon bursitis 21-Feb-202017   Dr. Percell Miller  . Renal calculi    bilateral   . Slow transit constipation   . Spondylolisthesis of lumbar region   . Uses wheelchair    [phone] septic shock -- residual bilateral leg weakness w/ inability to walk  . Vitamin D deficiency    Past Surgical History:  Procedure Laterality Date  . ABDOMINAL EXPOSURE N/A 03/24/2015   Procedure: ABDOMINAL EXPOSURE;  Surgeon: [name], MD;  Location: [facility];  Service: Vascular;  Laterality: N/A;  left side approach  . ANTERIOR CERVICAL DECOMP/DISCECTOMY FUSION N/A 03/18/2016   Procedure: Cervical four-five Cervical five-six Cervical six-seven Anterior cervical decompression/diskectomy/fusion;  Surgeon: [name], MD;  Location: [place] NEURO ORS;  Service: Neurosurgery;  Laterality: N/A;  n  . ANTERIOR LAT LUMBAR FUSION Right 03/24/2015   Procedure: Right Lumbar two-Three,Lumbar Three-Four,Lumbar Four-Five Anterior lateral lumbar interbody fusion ;  Surgeon: [name], MD;  Location: [place] NEURO ORS;  Service: Neurosurgery;  Laterality: Right;  right  . COLONOSCOPY  approx 2011   Dr. [name] (normal per pt report)  .  CYSTOSCOPY WITH RETROGRADE PYELOGRAM, URETEROSCOPY AND STENT PLACEMENT Left 08/15/2016   Procedure: CYSTOSCOPY WITH STENT REMOVAL,  RETROGRADES, URETEROSCOPY, STONE BASKETRY AND STENT REPLACEMENT;  Surgeon: [name], MD;  Location: [facility];  Service: Urology;  Laterality: Left;  . CYSTOSCOPY WITH STENT PLACEMENT Left 07/16/2016   Procedure: CYSTOSCOPY, RETROGRADE WITH LEFT URETERAL STENT PLACEMENT;  Surgeon: [name], MD;  [facility];  Service: Urology;  Laterality: Left;  . CYSTOSCOPY WITH STENT PLACEMENT Bilateral 02/27/2017   Procedure: CYSTOSCOPY WITH STENT PLACEMENT;  Surgeon: [name], MD;  Location: [facility];  Service: Urology;  Laterality: Bilateral;  . CYSTOSCOPY/RETROGRADE/URETEROSCOPY/STONE EXTRACTION WITH BASKET Bilateral 02/27/2017   Procedure: CYSTOSCOPY/RETROGRADE/URETEROSCOPY/STONE EXTRACTION WITH BASKET;  Surgeon: [name], MD;  Location: [facility];  Service: Urology;  Laterality: Bilateral;  . HOLMIUM LASER APPLICATION Left 66/02/4033   Procedure: HOLMIUM LASER;  Surgeon: Saralyn Pilar  Alita Chyle, MD;  Location: [facility];  Service: Urology;  Laterality: Left;  . INGUINAL HERNIA REPAIR Bilateral 1993  . POSTERIOR LUMBAR FUSION 4 LEVEL N/A 03/27/2015   Procedure: Posterior pedicle subtraction osteotomy with T10 to Iliac fusion with Dr. [name] assisting;  Surgeon: [name], MD;  Location: [facility]O [facility]S;  Service: Neurosurgery;  Laterality: N/A;  Posterior pedicle subtraction osteotomy with T10 to Iliac fusion with Dr. [name] assisting  . TONSILLECTOMY  1954   Family History  Problem Relation Age of Onset  . Stroke Mother   . Diabetes Mother   . Heart disease Mother   . Hypertension Mother   . Heart attack Mother   . Stroke Father   . Heart disease Father   . Diabetes Brother   . Hypertension Brother    Social History   Socioeconomic History  . Marital status: Widowed    Spouse name: Not on  file  . Number of children: Not on file  . Years of education: Not on file  . Highest education level: Not on file  Occupational History  . Not on file  Social Needs  . Financial resource strain: Not on file  . Food insecurity:    Worry: Not on file    Inability: Not on file  . Transportation needs:    Medical: Not on file    Non-medical: Not on file  Tobacco Use  . Smoking status: Former Smoker    Packs/day: 1.00    Years: 50.00    Pack years: 50.00    Types: Cigarettes    Last attempt to quit: 02/12/2014    Years since quitting: 4.3  . Smokeless tobacco: Never Used  Substance and Sexual Activity  . Alcohol use: Yes    Alcohol/week: 4.8 oz    Types: 8 Cans of beer per week    Comment: 1-2 beers q day   . Drug use: No  . Sexual activity: Never  Lifestyle  . Physical activity:    Days per week: Not on file    Minutes per session: Not on file  . Stress: Not on file  Relationships  . Social connections:    Talks on phone: Not on file    Gets together: Not on file    Attends religious service: Not on file    Active member of club or organization: Not on file    Attends meetings of clubs or organizations: Not on file    Relationship status: Not on file  Other Topics Concern  . Not on file  Social History Narrative   Widower as of 59 (wife [name] d. [place]), has one daughter and one grandson.   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   Orig from [place].  Has lived in [place] x 25 yrs.   Tobacco 50 pack-yr hx--Quit 02/2014.     Alcohol: 2 beers per day avg.  No drugs.   Exercise: none.  Has active job/physical labor.   Admitted to [facility] 03/2016   FULL CODE   Tobacco Counseling Counseling given: Not Answered   Activities of Daily Living In your present state of health, do you have any difficulty performing the following activities: 06/18/2018  Hearing? N  Vision? N  Difficulty concentrating or making decisions? N  Walking or climbing stairs? Y  Dressing or bathing? Y    Doing errands, shopping? Y  Some recent data might be hidden     Immunizations and Health Maintenance Immunization History  Administered Date(s)  Administered  . PPD Test 03/21/2016  . Pneumococcal Conjugate-13 04/16/2014  . Pneumococcal Polysaccharide-23 10/29/2012  . Tdap 01/17/2012  . Zoster 10/26/2012   Health Maintenance Due  Topic Date Due  . OPHTHALMOLOGY EXAM  03/31/1956  . INFLUENZA VACCINE  06/14/2018  . URINE MICROALBUMIN  06/14/2018    Patient Care Team: Tammi Sou, MD as PCP - General (Family Medicine) Jessy Oto, MD as Consulting Physician (Orthopedic Surgery) [name], MD as Consulting Physician (Neurosurgery) Renette Butters, MD as Consulting Physician (Orthopedic Surgery) McKenzie, Candee Furbish, MD as Consulting Physician (Urology) Meredith Staggers, MD as Consulting Physician (Physical Medicine and Rehabilitation) Landis Martins, DPM as Consulting Physician (Podiatry) Caress, Jeneen Rinks, MD as Consulting Physician (Neurology) Caress, Jeneen Rinks, MD as Consulting Physician (Neurology) [facility]  Indicate any recent Medical Services you may have received from other than [facility] providers in the past year (date may be approximate).    Assessment:   This is a routine wellness examination for Han.  Hearing/Vision screen Hearing Screening Comments: Able to hear conversational tones w/o difficulty. No issues reported.   Vision Screening Comments: Last exam > 5 years. Wears readers.   Dietary issues and exercise activities discussed: Current Exercise Habits: Structured exercise class, Frequency (Times/Week): 2, Exercise limited by: neurologic condition(s)   Diet (meal preparation, eat out, water intake, caffeinated beverages, dairy products, fruits and vegetables): Drinks protein shake, beet juice, coffee and water.   Breakfast: eggs/protein; english muffin with jam; hashbrowns; grits; coffee; juice; fruit Lunch: leftovers; sandwich Dinner: protein  and vegetables; pasta    Goals    . Patient Stated     Maintain current health by staying as active as possible.       Depression Screen PHQ 2/9 Scores 06/18/2018 08/23/2016  PHQ - 2 Score 0 0    Fall Risk Fall Risk  06/18/2018 03/16/2018 06/14/2017 10/26/2016 08/24/2016  Falls in the past year? No Exclusion - non ambulatory No No (No Data)  Comment - - - - last fall Sept 2017 getting out of shower     Cognitive Function: MMSE - Mini Mental State Exam 06/18/2018  Orientation to time 5  Orientation to Place 5  Registration 3  Attention/ Calculation 3  Recall 3  Language- name 2 objects 2  Language- repeat 1  Language- follow 3 step command 3  Language- read & follow direction 1  Write a sentence 0  Copy design 0  Total score 26  Total 26/28, unable to hold pen.       Screening Tests Health Maintenance  Topic Date Due  . OPHTHALMOLOGY EXAM  03/31/1956  . INFLUENZA VACCINE  06/14/2018  . URINE MICROALBUMIN  06/14/2018  . HEMOGLOBIN A1C  09/16/2018  . FOOT EXAM  12/21/2018  . COLONOSCOPY  11/17/2019  . TETANUS/TDAP  01/16/2022  . Hepatitis C Screening  Completed  . PNA vac Low Risk Adult  Completed   Does not plan to have eye exam at this time.       Plan:    Bring a copy of your living will and/or healthcare power of attorney to your next office visit.  Continue doing brain stimulating activities (puzzles, reading, adult coloring books, staying active) to keep memory sharp.   I have personally reviewed and noted the following in the patient's chart:   . Medical and social history . Use of alcohol, tobacco or illicit drugs  . Current medications and supplements . Functional ability and status . Nutritional status . Physical  activity . Advanced directives . List of other physicians . Hospitalizations, surgeries, and ER visits in previous 12 months . Vitals . Screenings to include cognitive, depression, and falls . Referrals and appointments  In addition, I  have reviewed and discussed with patient certain preventive protocols, quality metrics, and best practice recommendations. A written personalized care plan for preventive services as well as general preventive health recommendations were provided to patient.     Gerilyn Nestle, RN   06/18/2018

## 2018-06-18 ENCOUNTER — Telehealth: Payer: Self-pay | Admitting: Family Medicine

## 2018-06-18 ENCOUNTER — Ambulatory Visit (INDEPENDENT_AMBULATORY_CARE_PROVIDER_SITE_OTHER): Payer: Medicare Other | Admitting: Family Medicine

## 2018-06-18 ENCOUNTER — Encounter: Payer: Self-pay | Admitting: Family Medicine

## 2018-06-18 ENCOUNTER — Ambulatory Visit (INDEPENDENT_AMBULATORY_CARE_PROVIDER_SITE_OTHER): Payer: Medicare Other

## 2018-06-18 VITALS — BP 118/60 | HR 97 | Temp 98.4°F | Resp 16

## 2018-06-18 VITALS — BP 118/60 | HR 97 | Resp 16

## 2018-06-18 DIAGNOSIS — Z79899 Other long term (current) drug therapy: Secondary | ICD-10-CM | POA: Diagnosis not present

## 2018-06-18 DIAGNOSIS — Z8679 Personal history of other diseases of the circulatory system: Secondary | ICD-10-CM | POA: Diagnosis not present

## 2018-06-18 DIAGNOSIS — Z0000 Encounter for general adult medical examination without abnormal findings: Secondary | ICD-10-CM | POA: Diagnosis not present

## 2018-06-18 DIAGNOSIS — E119 Type 2 diabetes mellitus without complications: Secondary | ICD-10-CM

## 2018-06-18 DIAGNOSIS — I10 Essential (primary) hypertension: Secondary | ICD-10-CM | POA: Diagnosis not present

## 2018-06-18 NOTE — Progress Notes (Signed)
OFFICE VISIT  06/18/2018   CC:  Chief Complaint  Patient presents with  . Follow-up    RCI, pt is not fasting.    HPI:    Patient is a 72 y.o. Caucasian male who presents accompanied by his daughter for 3 mo f/u DM 2, history of HTN,    Most recent neuro specialist visit was 01/2018. Follows up next month.  HTN: consistently 130/80.  DM: no home gluc monitoring   Eating low carb diet.   His eating routine/activities are totally at the mercy of his caregivers' schedule since he cannot do anything.    ROS: no CP, no SOB, no wheezing, no cough, no dizziness, no HAs,, no melena/hematochezia.  No polyuria or polydipsia.  No myalgias or arthralgias. +[facility] and antifungal powder bid---improving per pt and caregiver.   Past Medical History:  Diagnosis Date  . Allergic rhinitis   . Amyotrophic lateral sclerosis (ALS) ([place]) 05/2017   Dr. Scarlette Slice Caress--[employer] ALS clinic.  . At high risk for falls   . Bilateral leg weakness    uses wheelchair  . BPH with obstruction/lower urinary tract symptoms   . BPPV (benign paroxysmal positional vertigo) 03/30/2012  . Carpal tunnel syndrome   . Cervical myelopathy ([place])   . Chronic low back pain   . Degenerative spinal arthritis   . Diabetes mellitus without complication ([place]) 1025  . Essential hypertension    hx of : bp good off meds 2018/2019  . Foot drop, bilateral    multiple back sx's  (does use AFO's , but does have them)  . GERD (gastroesophageal reflux disease)   . History of cardiac murmur as a child   . History of gout    left hand  . History of kidney stones    since 1997  . History of sepsis    09/ 2017 urosepsis  . History of squamous cell carcinoma excision    right thigh 03/ 2016  . Inability to walk    dx'd with ALS  . Olecranon bursitis 06-05-202017   Dr. Percell Miller  . Renal calculi    bilateral   . Slow transit constipation   . Spondylolisthesis  of lumbar region   . Uses wheelchair    [id number] septic shock -- residual bilateral leg weakness w/ inability to walk  . Vitamin D deficiency     Past Surgical History:  Procedure Laterality Date  . ABDOMINAL EXPOSURE N/A 03/24/2015   Procedure: ABDOMINAL EXPOSURE;  Surgeon: [name], MD;  Location: [facility]S;  Service: Vascular;  Laterality: N/A;  left side approach  . ANTERIOR CERVICAL DECOMP/DISCECTOMY FUSION N/A 03/18/2016   Procedure: Cervical four-five Cervical five-six Cervical six-seven Anterior cervical decompression/diskectomy/fusion;  Surgeon: [name], MD;  Location: [place] NEURO ORS;  Service: Neurosurgery;  Laterality: N/A;  n  . ANTERIOR LAT LUMBAR FUSION Right 03/24/2015   Procedure: Right Lumbar two-Three,Lumbar Three-Four,Lumbar Four-Five Anterior lateral lumbar interbody fusion ;  Surgeon: [name], MD;  Location: [place] NEURO ORS;  Service: Neurosurgery;  Laterality: Right;  right  . COLONOSCOPY  approx 2011   Dr. [name] (normal per pt report)  . CYSTOSCOPY WITH RETROGRADE PYELOGRAM, URETEROSCOPY AND STENT PLACEMENT Left 08/15/2016   Procedure: CYSTOSCOPY WITH STENT REMOVAL,  RETROGRADES, URETEROSCOPY, STONE BASKETRY AND STENT REPLACEMENT;  Surgeon: [name], MD;  Location: [facility];  Service: Urology;  Laterality: Left;  . [place]  Left 07/16/2016   Procedure: CYSTOSCOPY, RETROGRADE WITH LEFT URETERAL STENT PLACEMENT;  Surgeon: [name], MD;  [facility];  Service: Urology;  Laterality: Left;  . CYSTOSCOPY WITH STENT PLACEMENT Bilateral 02/27/2017   Procedure: CYSTOSCOPY WITH STENT PLACEMENT;  Surgeon: [name], MD;  Location: [facility];  Service: Urology;  Laterality: Bilateral;  . CYSTOSCOPY/RETROGRADE/URETEROSCOPY/STONE EXTRACTION WITH BASKET Bilateral 02/27/2017   Procedure: CYSTOSCOPY/RETROGRADE/URETEROSCOPY/STONE EXTRACTION WITH BASKET;  Surgeon: [name], MD;  [facility];  Service: Urology;  Laterality: Bilateral;  . HOLMIUM LASER APPLICATION Left 27/05/8241   Procedure: HOLMIUM LASER;  Surgeon: [name], MD;  [facility];  Service: Urology;  Laterality: Left;  . INGUINAL HERNIA REPAIR Bilateral 1993  . POSTERIOR LUMBAR FUSION 4 LEVEL N/A 03/27/2015   Procedure: Posterior pedicle subtraction osteotomy with T10 to Iliac fusion with Dr. [name] assisting;  Surgeon: [name], MD;  [facility];  Service: Neurosurgery;  Laterality: N/A;  Posterior pedicle subtraction osteotomy with T10 to Iliac fusion with Dr. [name] assisting  . TONSILLECTOMY  1954    Outpatient Medications Prior to Visit  Medication Sig Dispense Refill  . alfuzosin (UROXATRAL) 10 MG 24 hr tablet Take 10 mg by mouth at bedtime.     Jolyne Loa Grape-Goldenseal (BERBERINE COMPLEX PO) Take 1 tablet by mouth 3 (three) times daily.    . Cholecalciferol (VITAMIN D3) 5000 units CAPS Take 1 capsule by mouth daily.    . Menaquinone-7 (VITAMIN K2 PO) Take 1 tablet by mouth daily.     . Multiple Vitamins-Minerals (MULTIVITAMIN WITH MINERALS) tablet Take 1 tablet by mouth daily.    . nitrofurantoin (MACRODANTIN) 100 MG capsule Take 50 mg by mouth daily.     Marland Kitchen OVER THE COUNTER MEDICATION OPC3 and Isochrome powders 1 capful of both mixed in 4 ounces of water daily Neuroquell - 1 cap daily Cardio Cover - 1 tab BID Vital Artery Cleanse - 1 cap daily Gluco-800 - 1 tab daily Chelation Plus Reveratrol - 3 tab daily Diabetic Nerve Regenerator - 3 cap daily D-Mannos - 1 tab BID    . Potassium Citrate (UROCIT-K 15) 15 MEQ (1620 MG) TBCR Take 1 tablet by mouth 2 (two) times daily.    . Probiotic Product (PROBIOTIC PO) Take 50 mg by mouth daily.    . riluzole (RILUTEK) 50 MG tablet Take 50 mg by mouth every 12 (twelve) hours. Per [facility] ALS specialist    . mupirocin ointment (BACTROBAN) 2 % Apply 1 application topically 2 (two) times daily. (Patient not  taking: Reported on 06/18/2018) 15 g 1  . OVER THE COUNTER MEDICATION Take 1 capsule by mouth daily. neuroquell    . OVER THE COUNTER MEDICATION Take 1 tablet by mouth 2 (two) times daily. Public relations account executive    . OVER THE COUNTER MEDICATION Take 1 capsule by mouth daily. VITAL ARTERY CLEANSE    . OVER THE COUNTER MEDICATION Take 1 tablet daily by mouth. Gluco- 800    . OVER THE COUNTER MEDICATION Take 3 tablets daily by mouth. Chelation Plus Reveratrol    . OVER THE COUNTER MEDICATION Take 3 capsules by mouth daily. Diabetic Nerve Regenerator    . OVER THE COUNTER MEDICATION Take 1 tablet by mouth 2 (two) times daily. D-Mannos     No facility-administered medications prior to visit.     No Known Allergies  ROS As per HPI  PE: Blood pressure 118/60, pulse 97, temperature  98.4 F (36.9 C), temperature source Oral, resp. rate 16, SpO2 94 %. Gen: Alert, well appearing.  Patient is oriented to person, place, time, and situation. AFFECT: pleasant, lucid thought and speech. CV: RRR, no m/r/g.   LUNGS: CTA bilat, nonlabored resps, good aeration in all lung fields. EXT: no clubbing, cyanosis, or edema.  He is wearing compression stockings.   LABS:    Chemistry      Component Value Date/Time   NA 134 (A) 12/06/2017   K 1.3 (A) 12/06/2017   CL 99 03/15/2017 1124   CO2 28 03/15/2017 1124   BUN 16 03/15/2017 1124   BUN 22 (A) 03/06/2017   CREATININE 0.7 12/06/2017   CREATININE 0.39 (L) 03/15/2017 1124   CREATININE 0.77 03/30/2012 1537   GLU 239 12/06/2017      Component Value Date/Time   CALCIUM 9.0 03/15/2017 1124   ALKPHOS 84 12/06/2017   AST 18 12/06/2017   ALT 33 12/06/2017   BILITOT 0.6 07/23/2016 0534     Lab Results  Component Value Date   WBC 9.2 12/06/2017   HGB 13.9 12/06/2017   HCT 43 12/06/2017   MCV 88.4 08/04/2016   PLT 232 12/06/2017   Lab Results  Component Value Date   CHOL 186 03/15/2017   HDL 37.70 (L) 03/15/2017   LDLCALC 127 (H) 03/15/2017   TRIG 104.0  03/15/2017   CHOLHDL 5 03/15/2017     IMPRESSION AND PLAN:  1) DM 2, diet controlled. Doing well with diet. Unable to exercise due to ALS/wheelchair-restricted. A1c, lytes/cr today.  2) Hx of HTN: has been doing well off of bp med, doing well with monitoring at home.  3) ALS, on riluzole/high risk med---will monitor CBC w/diff and CMET. He'll f/u with ALS specialist next month. Will send his ALS specialist and [facility] copies of labs that we get here.  An After Visit Summary was printed and given to the patient.  FOLLOW UP: Return in about 3 months (around 09/18/2018) for routine chronic illness f/u.  Signed:  Crissie Sickles, MD           06/18/2018

## 2018-06-18 NOTE — Patient Instructions (Addendum)
Bring a copy of your living will and/or healthcare power of attorney to your next office visit.  Continue doing brain stimulating activities (puzzles, reading, adult coloring books, staying active) to keep memory sharp.    Health Maintenance, Male A healthy lifestyle and preventive care is important for your health and wellness. Ask your health care provider about what schedule of regular examinations is right for you. What should I know about weight and diet? Eat a Healthy Diet  Eat plenty of vegetables, fruits, whole grains, low-fat dairy products, and lean protein.  Do not eat a lot of foods high in solid fats, added sugars, or salt.  Maintain a Healthy Weight Regular exercise can help you achieve or maintain a healthy weight. You should:  Do at least 150 minutes of exercise each week. The exercise should increase your heart rate and make you sweat (moderate-intensity exercise).  Do strength-training exercises at least twice a week.  Watch Your Levels of Cholesterol and Blood Lipids  Have your blood tested for lipids and cholesterol every 5 years starting at 72 years of age. If you are at high risk for heart disease, you should start having your blood tested when you are 72 years old. You may need to have your cholesterol levels checked more often if: ? Your lipid or cholesterol levels are high. ? You are older than 72 years of age. ? You are at high risk for heart disease.  What should I know about cancer screening? Many types of cancers can be detected early and may often be prevented. Lung Cancer  You should be screened every year for lung cancer if: ? You are a current smoker who has smoked for at least 30 years. ? You are a former smoker who has quit within the past 15 years.  Talk to your health care provider about your screening options, when you should start screening, and how often you should be screened.  Colorectal Cancer  Routine colorectal cancer screening  usually begins at 72 years of age and should be repeated every 5-10 years until you are 72 years old. You may need to be screened more often if early forms of precancerous polyps or small growths are found. Your health care provider may recommend screening at an earlier age if you have risk factors for colon cancer.  Your health care provider may recommend using home test kits to check for hidden blood in the stool.  A small camera at the end of a tube can be used to examine your colon (sigmoidoscopy or colonoscopy). This checks for the earliest forms of colorectal cancer.  Prostate and Testicular Cancer  Depending on your age and overall health, your health care provider may do certain tests to screen for prostate and testicular cancer.  Talk to your health care provider about any symptoms or concerns you have about testicular or prostate cancer.  Skin Cancer  Check your skin from head to toe regularly.  Tell your health care provider about any new moles or changes in moles, especially if: ? There is a change in a mole's size, shape, or color. ? You have a mole that is larger than a pencil eraser.  Always use sunscreen. Apply sunscreen liberally and repeat throughout the day.  Protect yourself by wearing long sleeves, pants, a wide-brimmed hat, and sunglasses when outside.  What should I know about heart disease, diabetes, and high blood pressure?  If you are 18-39 years of age, have your blood pressure checked every   3-5 years. If you are 40 years of age or older, have your blood pressure checked every year. You should have your blood pressure measured twice-once when you are at a hospital or clinic, and once when you are not at a hospital or clinic. Record the average of the two measurements. To check your blood pressure when you are not at a hospital or clinic, you can use: ? An automated blood pressure machine at a pharmacy. ? A home blood pressure monitor.  Talk to your health care  provider about your target blood pressure.  If you are between 45-79 years old, ask your health care provider if you should take aspirin to prevent heart disease.  Have regular diabetes screenings by checking your fasting blood sugar level. ? If you are at a normal weight and have a low risk for diabetes, have this test once every three years after the age of 45. ? If you are overweight and have a high risk for diabetes, consider being tested at a younger age or more often.  A one-time screening for abdominal aortic aneurysm (AAA) by ultrasound is recommended for men aged 65-75 years who are current or former smokers. What should I know about preventing infection? Hepatitis B If you have a higher risk for hepatitis B, you should be screened for this virus. Talk with your health care provider to find out if you are at risk for hepatitis B infection. Hepatitis C Blood testing is recommended for:  Everyone born from 1945 through 1965.  Anyone with known risk factors for hepatitis C.  Sexually Transmitted Diseases (STDs)  You should be screened each year for STDs including gonorrhea and chlamydia if: ? You are sexually active and are younger than 72 years of age. ? You are older than 72 years of age and your health care provider tells you that you are at risk for this type of infection. ? Your sexual activity has changed since you were last screened and you are at an increased risk for chlamydia or gonorrhea. Ask your health care provider if you are at risk.  Talk with your health care provider about whether you are at high risk of being infected with HIV. Your health care provider may recommend a prescription medicine to help prevent HIV infection.  What else can I do?  Schedule regular health, dental, and eye exams.  Stay current with your vaccines (immunizations).  Do not use any tobacco products, such as cigarettes, chewing tobacco, and e-cigarettes. If you need help quitting, ask  your health care provider.  Limit alcohol intake to no more than 2 drinks per day. One drink equals 12 ounces of beer, 5 ounces of wine, or 1 ounces of hard liquor.  Do not use street drugs.  Do not share needles.  Ask your health care provider for help if you need support or information about quitting drugs.  Tell your health care provider if you often feel depressed.  Tell your health care provider if you have ever been abused or do not feel safe at home. This information is not intended to replace advice given to you by your health care provider. Make sure you discuss any questions you have with your health care provider. Document Released: 04/28/2008 Document Revised: 06/29/2016 Document Reviewed: 08/04/2015 Elsevier Interactive Patient Education  2018 Elsevier Inc.  

## 2018-06-18 NOTE — Telephone Encounter (Signed)
Copied from [place] [id number]. Topic: General - Other >> Jun 18, 2018  4:49 PM Cecelia Byars, NT wrote: Reason for CRM: Patient called to leave information from the V[facility] in [facility] please call  [facility] [phone]

## 2018-06-19 LAB — CBC WITH DIFFERENTIAL/PLATELET
Basophils Absolute: 0.1 10*3/uL (ref 0.0–0.1)
Basophils Relative: 1 % (ref 0.0–3.0)
EOS ABS: 0.2 10*3/uL (ref 0.0–0.7)
EOS PCT: 2.3 % (ref 0.0–5.0)
HCT: 41.8 % (ref 39.0–52.0)
HEMOGLOBIN: 14 g/dL (ref 13.0–17.0)
LYMPHS ABS: 1.7 10*3/uL (ref 0.7–4.0)
Lymphocytes Relative: 17.3 % (ref 12.0–46.0)
MCHC: 33.5 g/dL (ref 30.0–36.0)
MCV: 92.4 fl (ref 78.0–100.0)
MONO ABS: 0.7 10*3/uL (ref 0.1–1.0)
Monocytes Relative: 6.6 % (ref 3.0–12.0)
NEUTROS PCT: 72.8 % (ref 43.0–77.0)
Neutro Abs: 7.3 10*3/uL (ref 1.4–7.7)
PLATELETS: 253 10*3/uL (ref 150.0–400.0)
RBC: 4.52 Mil/uL (ref 4.22–5.81)
RDW: 13.6 % (ref 11.5–15.5)
WBC: 10 10*3/uL (ref 4.0–10.5)

## 2018-06-19 LAB — HEMOGLOBIN A1C: HEMOGLOBIN A1C: 6.9 % — AB (ref 4.6–6.5)

## 2018-06-19 LAB — COMPREHENSIVE METABOLIC PANEL
ALBUMIN: 4.2 g/dL (ref 3.5–5.2)
ALK PHOS: 79 U/L (ref 39–117)
ALT: 24 U/L (ref 0–53)
AST: 20 U/L (ref 0–37)
BUN: 24 mg/dL — ABNORMAL HIGH (ref 6–23)
CALCIUM: 9.8 mg/dL (ref 8.4–10.5)
CHLORIDE: 100 meq/L (ref 96–112)
CO2: 30 mEq/L (ref 19–32)
Creatinine, Ser: 0.49 mg/dL (ref 0.40–1.50)
GFR: 177.71 mL/min (ref >60.00)
Glucose, Bld: 272 mg/dL — ABNORMAL HIGH (ref 70–99)
POTASSIUM: 5 meq/L (ref 3.5–5.1)
Sodium: 138 mEq/L (ref 135–145)
TOTAL PROTEIN: 6.8 g/dL (ref 6.0–8.3)
Total Bilirubin: 0.4 mg/dL (ref 0.2–1.2)

## 2018-06-19 NOTE — Telephone Encounter (Signed)
[employer], info put in permanent comments.

## 2018-06-21 NOTE — Progress Notes (Signed)
AWV reviewed and agree. Signed:  Crissie Sickles, MD           06/21/2018

## 2018-07-18 DIAGNOSIS — G1221 Amyotrophic lateral sclerosis: Secondary | ICD-10-CM | POA: Diagnosis not present

## 2018-07-18 DIAGNOSIS — Z79899 Other long term (current) drug therapy: Secondary | ICD-10-CM | POA: Diagnosis not present

## 2018-07-24 DIAGNOSIS — M21372 Foot drop, left foot: Secondary | ICD-10-CM | POA: Diagnosis not present

## 2018-07-24 DIAGNOSIS — H8110 Benign paroxysmal vertigo, unspecified ear: Secondary | ICD-10-CM | POA: Diagnosis not present

## 2018-07-24 DIAGNOSIS — K219 Gastro-esophageal reflux disease without esophagitis: Secondary | ICD-10-CM | POA: Diagnosis not present

## 2018-07-24 DIAGNOSIS — E119 Type 2 diabetes mellitus without complications: Secondary | ICD-10-CM | POA: Diagnosis not present

## 2018-07-24 DIAGNOSIS — I10 Essential (primary) hypertension: Secondary | ICD-10-CM | POA: Diagnosis not present

## 2018-07-24 DIAGNOSIS — G1221 Amyotrophic lateral sclerosis: Secondary | ICD-10-CM | POA: Diagnosis not present

## 2018-09-11 ENCOUNTER — Ambulatory Visit (INDEPENDENT_AMBULATORY_CARE_PROVIDER_SITE_OTHER): Payer: Medicare Other | Admitting: Podiatry

## 2018-09-11 ENCOUNTER — Encounter: Payer: Self-pay | Admitting: Podiatry

## 2018-09-11 DIAGNOSIS — I739 Peripheral vascular disease, unspecified: Secondary | ICD-10-CM | POA: Diagnosis not present

## 2018-09-11 DIAGNOSIS — M79674 Pain in right toe(s): Secondary | ICD-10-CM | POA: Diagnosis not present

## 2018-09-11 DIAGNOSIS — M79675 Pain in left toe(s): Secondary | ICD-10-CM | POA: Diagnosis not present

## 2018-09-11 DIAGNOSIS — G1221 Amyotrophic lateral sclerosis: Secondary | ICD-10-CM

## 2018-09-11 DIAGNOSIS — B351 Tinea unguium: Secondary | ICD-10-CM

## 2018-09-11 NOTE — Progress Notes (Signed)
Complaint:  Visit Type: Patient returns to my office for continued preventative foot care services. Complaint: Patient states" my nails have grown long and thick and become painful to walk and wear shoes" ALS with pvd..  Patient is in a wheelchair.. The patient presents for preventative foot care services. No changes to ROS  Podiatric Exam: Vascular: dorsalis pedis and posterior tibial pulses are not  palpable bilateral. Cold feet. Skin turgor WNL  Sensorium: deferred. Nail Exam: Pt has thick disfigured discolored nails with subungual debris noted bilateral entire nail hallux through fifth toenails Ulcer Exam: There is no evidence of ulcer or pre-ulcerative changes or infection. Orthopedic Exam: Muscle tone and strength are WNL. No limitations in general ROM. No crepitus or effusions noted. Foot type and digits show no abnormalities. Bony prominences are unremarkable. Skin: No Porokeratosis. No infection or ulcers  Diagnosis:  Onychomycosis, , Pain in right toe, pain in left toes  Treatment & Plan Procedures and Treatment: Consent by patient was obtained for treatment procedures.   Debridement of mycotic and hypertrophic toenails, 1 through 5 bilateral and clearing of subungual debris. No ulceration, no infection noted.  Return Visit-Office Procedure: Patient instructed to return to the office for a follow up visit 3 months for continued evaluation and treatment.    Neyra Pettie DPM 

## 2018-09-21 ENCOUNTER — Ambulatory Visit (INDEPENDENT_AMBULATORY_CARE_PROVIDER_SITE_OTHER): Payer: Medicare Other | Admitting: Family Medicine

## 2018-09-21 ENCOUNTER — Encounter: Payer: Self-pay | Admitting: Family Medicine

## 2018-09-21 VITALS — BP 118/77 | HR 93 | Resp 20

## 2018-09-21 DIAGNOSIS — E119 Type 2 diabetes mellitus without complications: Secondary | ICD-10-CM

## 2018-09-21 DIAGNOSIS — G1221 Amyotrophic lateral sclerosis: Secondary | ICD-10-CM

## 2018-09-21 DIAGNOSIS — Z8679 Personal history of other diseases of the circulatory system: Secondary | ICD-10-CM

## 2018-09-21 LAB — POCT GLYCOSYLATED HEMOGLOBIN (HGB A1C): Hemoglobin A1C: 7.8 % — AB (ref 4.0–5.6)

## 2018-09-21 MED ORDER — FLUTICASONE PROPIONATE 50 MCG/ACT NA SUSP: 2.0000 | Freq: Every day | NASAL | 6 refills | Status: AC

## 2018-09-21 NOTE — Progress Notes (Signed)
OFFICE VISIT  10/03/2018   CC:  Chief Complaint  Patient presents with  . Diabetes   HPI:    Patient is a 72 y.o. Caucasian male with ALS who presents accompanied by his brother for 3 mo f/u DM and hx of HTN.  ALS:  Pt's most recent visit to [facility] 07/2018, note reviewed today. Upper extremities gradually getting weaker, cough and secretions clearance not as efficient-->CPT vest and BiPap started recently.  DM: appetite is good.   No home glucose monitoring.  Lots of cake and ice cream/birthdays over the last month.  BP: BP's at home consistently 120s/70s, occ 130s/80s.  No urinary complaints, no flank pain. He goes to his urologist in a couple of weeks.  Past Medical History:  Diagnosis Date  . Allergic rhinitis   . Amyotrophic lateral sclerosis (ALS) ([place]) 05/2017   Dr. Scarlette Slice Caress--[facility] [employer] [facility].  . At high risk for falls   . Bilateral leg weakness    uses wheelchair  . BPH with obstruction/lower urinary tract symptoms   . BPPV (benign paroxysmal positional vertigo) 03/30/2012  . Carpal tunnel syndrome   . Cervical myelopathy ([place])   . Chronic low back pain   . Degenerative spinal arthritis   . Diabetes mellitus without complication ([place]) 2330  . Essential hypertension    hx of : bp good off meds 2018/2019  . Foot drop, bilateral    multiple back sx's  (does use AFO's , but does have them)  . GERD (gastroesophageal reflux disease)   . History of cardiac murmur as a child   . History of gout    left hand  . History of kidney stones    since 1997  . History of sepsis    09/ 2017 urosepsis  . History of squamous cell carcinoma excision    right thigh 03/ 2016  . Inability to walk    dx'd with ALS  . Olecranon bursitis 08-May-202017   Dr. Percell Miller  . Renal calculi    bilateral   . Slow transit constipation   . Spondylolisthesis of lumbar region   . Uses wheelchair    [phone] septic shock -- residual bilateral leg weakness w/  inability to walk  . Vitamin D deficiency     Past Surgical History:  Procedure Laterality Date  . ABDOMINAL EXPOSURE N/A 03/24/2015   Procedure: ABDOMINAL EXPOSURE;  Surgeon: [name], MD;  Location: [facility];  Service: Vascular;  Laterality: N/A;  left side approach  . ANTERIOR CERVICAL DECOMP/DISCECTOMY FUSION N/A 03/18/2016   Procedure: Cervical four-five Cervical five-six Cervical six-seven Anterior cervical decompression/diskectomy/fusion;  Surgeon: [name], MD;  Location: [facility];  Service: Neurosurgery;  Laterality: N/A;  n  . ANTERIOR LAT LUMBAR FUSION Right 03/24/2015   Procedure: Right Lumbar two-Three,Lumbar Three-Four,Lumbar Four-Five Anterior lateral lumbar interbody fusion ;  Surgeon: [name], MD;  Location: [facility];  Service: Neurosurgery;  Laterality: Right;  right  . COLONOSCOPY  approx 2011   Dr. [name] (normal per pt report)  . CYSTOSCOPY WITH RETROGRADE PYELOGRAM, URETEROSCOPY AND STENT PLACEMENT Left 08/15/2016   Procedure: CYSTOSCOPY WITH STENT REMOVAL,  RETROGRADES, URETEROSCOPY, STONE BASKETRY AND STENT REPLACEMENT;  Surgeon: [name], MD;  Location: [facility];  Service: Urology;  Laterality: Left;  . CYSTOSCOPY WITH STENT PLACEMENT Left 07/16/2016   Procedure: CYSTOSCOPY, RETROGRADE WITH LEFT URETERAL STENT PLACEMENT;  Surgeon: [name], MD;  Location: [facility];  Service: Urology;  Laterality: Left;  . CYSTOSCOPY WITH STENT PLACEMENT Bilateral 02/27/2017   Procedure: CYSTOSCOPY WITH STENT PLACEMENT;  Surgeon: [name], MD;  [facility];  Service: Urology;  Laterality: Bilateral;  . CYSTOSCOPY/RETROGRADE/URETEROSCOPY/STONE EXTRACTION WITH BASKET Bilateral 02/27/2017   Procedure: CYSTOSCOPY/RETROGRADE/URETEROSCOPY/STONE EXTRACTION WITH BASKET;  Surgeon: [name], MD;  Location: [facility];  Service: Urology;  Laterality: Bilateral;  . HOLMIUM LASER APPLICATION Left  16/11/958   Procedure: HOLMIUM LASER;  Surgeon: [name], MD;  Location: [facility];  Service: Urology;  Laterality: Left;  . INGUINAL HERNIA REPAIR Bilateral 1993  . POSTERIOR LUMBAR FUSION 4 LEVEL N/A 03/27/2015   Procedure: Posterior pedicle subtraction osteotomy with T10 to Iliac fusion with Dr. [name] assisting;  Surgeon: [name], MD;  Location: [facility]O [facility];  Service: Neurosurgery;  Laterality: N/A;  Posterior pedicle subtraction osteotomy with T10 to Iliac fusion with Dr. [name] assisting  . TONSILLECTOMY  1954    Outpatient Medications Prior to Visit  Medication Sig Dispense Refill  . alfuzosin (UROXATRAL) 10 MG 24 hr tablet Take 10 mg by mouth at bedtime.     Jolyne Loa Grape-Goldenseal (BERBERINE COMPLEX PO) Take 1 tablet by mouth 3 (three) times daily.    . Cholecalciferol (VITAMIN D3) 5000 units CAPS Take 1 capsule by mouth daily.    . Menaquinone-7 (VITAMIN K2 PO) Take 1 tablet by mouth daily.     . Multiple Vitamins-Minerals (MULTIVITAMIN WITH MINERALS) tablet Take 1 tablet by mouth daily.    . nitrofurantoin (MACRODANTIN) 100 MG capsule Take 50 mg by mouth daily.     Marland Kitchen OVER THE COUNTER MEDICATION OPC3 and Isochrome powders 1 capful of both mixed in 4 ounces of water daily Neuroquell - 1 cap daily Cardio Cover - 1 tab BID Vital Artery Cleanse - 1 cap daily Gluco-800 - 1 tab daily Chelation Plus Reveratrol - 3 tab daily Diabetic Nerve Regenerator - 3 cap daily D-Mannos - 1 tab BID    . Potassium Citrate (UROCIT-K 15) 15 MEQ (1620 MG) TBCR Take 1 tablet by mouth 2 (two) times daily.    . Probiotic Product (PROBIOTIC PO) Take 50 mg by mouth daily.    . riluzole (RILUTEK) 50 MG tablet Take 50 mg by mouth every 12 (twelve) hours. Per [facility] ALS specialist     No facility-administered medications prior to visit.     No Known Allergies  ROS As per HPI  PE: Blood pressure 118/77, pulse 93, resp. rate 20, SpO2 98 %. Gen: Alert, well appearing.   Patient is oriented to person, place, time, and situation. AFFECT: pleasant, lucid thought and speech. AVW:UJWJ: no injection, icteris, swelling, or exudate.  EOMI, PERRLA. Mouth: lips without lesion/swelling.  Oral mucosa pink and moist. Oropharynx without erythema, exudate, or swelling.  CV: RRR, no m/r/g.   LUNGS: CTA bilat, nonlabored resps, good aeration in all lung fields.   LABS:  Lab Results  Component Value Date   TSH 1.52 04/16/2014   Lab Results  Component Value Date   WBC 10.0 06/18/2018   HGB 14.0 06/18/2018   HCT 41.8 06/18/2018   MCV 92.4 06/18/2018   PLT 253.0 06/18/2018   Lab Results  Component Value Date   CREATININE 0.49 06/18/2018   BUN 24 (H) 06/18/2018   NA 138 06/18/2018   K 5.0 06/18/2018   CL 100 06/18/2018   CO2 30 06/18/2018   Lab Results  Component Value Date   ALT 24 06/18/2018  AST 20 06/18/2018   ALKPHOS 79 06/18/2018   BILITOT 0.4 06/18/2018   Lab Results  Component Value Date   CHOL 186 03/15/2017   Lab Results  Component Value Date   HDL 37.70 (L) 03/15/2017   Lab Results  Component Value Date   LDLCALC 127 (H) 03/15/2017   Lab Results  Component Value Date   TRIG 104.0 03/15/2017   Lab Results  Component Value Date   CHOLHDL 5 03/15/2017   Lab Results  Component Value Date   PSA 0.69 04/16/2014   PSA 0.49 04/29/2013   Lab Results  Component Value Date   HGBA1C 7.8 (A) 09/21/2018   POC HbA1c = 7.8% today  IMPRESSION AND PLAN:  1) DM 2: some dietary indiscretion of late. A1c 7.8% today.  This is reasonable, but if it gets over 8% I'll recommend he start metformin.  2) Hx of HTN: his bp's are normal with home bp monitoring. Lytes/cr good 3 mo ago.  Plan repeat BMET next f/u 4 mo.  3) ALS: progressive upper extremity weakness and decreased ability to clear oropharyngeal and pulmonary secretions. He has a CPT vest now. Has BiPap at home to try intermittently b/c he'll likely progress to having to use this in  the relatively near future. He'll try to remember to verify with the [facility] whether or not he has had pneumovax 23 vaccine.  An After Visit Summary was printed and given to the patient.  FOLLOW UP: Return in about 4 months (around 01/20/2019) for routine chronic illness f/u.  Signed:  Crissie Sickles, MD           10/03/2018

## 2018-09-21 NOTE — Patient Instructions (Signed)
[facility] with the [facility] about pneumonia vaccine (pneumovax 23).

## 2018-10-04 DIAGNOSIS — K219 Gastro-esophageal reflux disease without esophagitis: Secondary | ICD-10-CM | POA: Diagnosis not present

## 2018-10-04 DIAGNOSIS — E119 Type 2 diabetes mellitus without complications: Secondary | ICD-10-CM | POA: Diagnosis not present

## 2018-10-04 DIAGNOSIS — I10 Essential (primary) hypertension: Secondary | ICD-10-CM

## 2018-10-04 DIAGNOSIS — H8110 Benign paroxysmal vertigo, unspecified ear: Secondary | ICD-10-CM

## 2018-10-04 DIAGNOSIS — M21371 Foot drop, right foot: Secondary | ICD-10-CM | POA: Diagnosis not present

## 2018-10-04 DIAGNOSIS — N200 Calculus of kidney: Secondary | ICD-10-CM | POA: Diagnosis not present

## 2018-10-04 DIAGNOSIS — N3000 Acute cystitis without hematuria: Secondary | ICD-10-CM | POA: Diagnosis not present

## 2018-10-04 DIAGNOSIS — M21372 Foot drop, left foot: Secondary | ICD-10-CM | POA: Diagnosis not present

## 2018-10-04 DIAGNOSIS — G1221 Amyotrophic lateral sclerosis: Secondary | ICD-10-CM | POA: Diagnosis not present

## 2018-12-11 ENCOUNTER — Encounter: Payer: Self-pay | Admitting: Podiatry

## 2018-12-11 ENCOUNTER — Ambulatory Visit (INDEPENDENT_AMBULATORY_CARE_PROVIDER_SITE_OTHER): Payer: Medicare Other | Admitting: Podiatry

## 2018-12-11 DIAGNOSIS — I739 Peripheral vascular disease, unspecified: Secondary | ICD-10-CM

## 2018-12-11 DIAGNOSIS — B351 Tinea unguium: Secondary | ICD-10-CM

## 2018-12-11 DIAGNOSIS — M79675 Pain in left toe(s): Secondary | ICD-10-CM

## 2018-12-11 DIAGNOSIS — G1221 Amyotrophic lateral sclerosis: Secondary | ICD-10-CM

## 2018-12-11 DIAGNOSIS — M79674 Pain in right toe(s): Secondary | ICD-10-CM | POA: Diagnosis not present

## 2018-12-11 NOTE — Progress Notes (Signed)
Complaint:  Visit Type: Patient returns to my office for continued preventative foot care services. Complaint: Patient states" my nails have grown long and thick and become painful to walk and wear shoes" ALS with pvd..  Patient is in a wheelchair.. The patient presents for preventative foot care services. No changes to ROS  Podiatric Exam: Vascular: dorsalis pedis and posterior tibial pulses are not  palpable bilateral. Cold feet. Skin turgor WNL  Sensorium: deferred. Nail Exam: Pt has thick disfigured discolored nails with subungual debris noted bilateral entire nail hallux through fifth toenails Ulcer Exam: There is no evidence of ulcer or pre-ulcerative changes or infection. Orthopedic Exam: Muscle tone and strength are WNL. No limitations in general ROM. No crepitus or effusions noted. Foot type and digits show no abnormalities. Bony prominences are unremarkable. Skin: No Porokeratosis. No infection or ulcers  Diagnosis:  Onychomycosis, , Pain in right toe, pain in left toes  Treatment & Plan Procedures and Treatment: Consent by patient was obtained for treatment procedures.   Debridement of mycotic and hypertrophic toenails, 1 through 5 bilateral and clearing of subungual debris. No ulceration, no infection noted.  Return Visit-Office Procedure: Patient instructed to return to the office for a follow up visit 3 months for continued evaluation and treatment.    Gardiner Barefoot DPM

## 2019-01-15 ENCOUNTER — Telehealth: Payer: Self-pay | Admitting: Family Medicine

## 2019-01-15 NOTE — Telephone Encounter (Signed)
Copied from [facility] [id number]. Topic: Quick Communication - Home Health Verbal Orders >> Jan 15, 2019 11:55 AM Bea Graff, NT wrote: Caller/Agency: [facility] Number: [phone] Requesting OT/PT/Skilled Nursing/Social Work: PT  Frequency: 1 time every other week for 5 weeks. Need OT eval and treat order

## 2019-01-15 NOTE — Telephone Encounter (Signed)
LM on secure VM with verbal orders ?

## 2019-01-15 NOTE — Telephone Encounter (Signed)
OK to give verbal order for this.-thx

## 2019-01-16 NOTE — Telephone Encounter (Signed)
Received forms from [facility] for verbal orders.  Placed on providers desk.

## 2019-01-16 NOTE — Telephone Encounter (Signed)
Orders signed and put in box to go up front.

## 2019-01-24 ENCOUNTER — Encounter: Payer: Self-pay | Admitting: Family Medicine

## 2019-01-24 ENCOUNTER — Other Ambulatory Visit: Payer: Self-pay

## 2019-01-24 ENCOUNTER — Ambulatory Visit (INDEPENDENT_AMBULATORY_CARE_PROVIDER_SITE_OTHER): Payer: Medicare Other | Admitting: Family Medicine

## 2019-01-24 VITALS — BP 131/81 | HR 82 | Temp 97.9°F | Resp 20

## 2019-01-24 DIAGNOSIS — J300 Vasomotor rhinitis: Secondary | ICD-10-CM | POA: Diagnosis not present

## 2019-01-24 DIAGNOSIS — E119 Type 2 diabetes mellitus without complications: Secondary | ICD-10-CM | POA: Diagnosis not present

## 2019-01-24 DIAGNOSIS — I10 Essential (primary) hypertension: Secondary | ICD-10-CM

## 2019-01-24 DIAGNOSIS — G1221 Amyotrophic lateral sclerosis: Secondary | ICD-10-CM

## 2019-01-24 MED ORDER — AZELASTINE HCL 0.15 % NA SOLN: NASAL | 6 refills | Status: AC

## 2019-01-24 NOTE — Progress Notes (Signed)
OFFICE VISIT  01/24/2019   CC:  Chief Complaint  Patient presents with  . Follow-up    RCI, pt is not fasting.   HPI:    Patient is a 73 y.o. Caucasian male with ALS who presents accompanied by his brother Ronalee Belts for 4 mo f/u DM and hx of HTN.  DM: no home gluc monitoring.  Food choices not so good but tries to limit portion sizes. No polyuria, polydipsia, and no polyphagia.  HTN: Consistently 120s-130s/80s.  No urinary complaints.  Uses condom cath hs  Next visit with ALS clinic is next week. Still gradually feeling weaker in UE's, can't lift L arm hardly at all now.  Legs have long been very weak. No sign of neurogenic bladder.  Has chronic nasal congestion.  Significant PND.  Denies reflux.  Takes flonase regularly. Tongue very dry in mornings, no excessive oral secretions in day or night.  No ST, Fever, or HA. Minimal PND cough.  No new aeroallergens.  Past Medical History:  Diagnosis Date  . Allergic rhinitis   . Amyotrophic lateral sclerosis (ALS) ([place]) 05/2017   Dr. Scarlette Slice Caress--[facility] [employer] [facility].  . At high risk for falls   . Bilateral leg weakness    uses wheelchair  . BPH with obstruction/lower urinary tract symptoms   . BPPV (benign paroxysmal positional vertigo) 03/30/2012  . Carpal tunnel syndrome   . Cervical myelopathy ([place])   . Chronic low back pain   . Degenerative spinal arthritis   . Diabetes mellitus without complication ([place]) 7253  . Essential hypertension    hx of : bp good off meds 2018/2019  . Foot drop, bilateral    multiple back sx's  (does use AFO's , but does have them)  . GERD (gastroesophageal reflux disease)   . History of cardiac murmur as a child   . History of gout    left hand  . History of kidney stones    since 1997  . History of sepsis    09/ 2017 urosepsis  . History of squamous cell carcinoma excision    right thigh 03/ 2016  . Inability to walk    dx'd with ALS  . Olecranon bursitis 2020-02-2916   Dr. Percell Miller  . Renal calculi    bilateral   . Slow transit constipation   . Spondylolisthesis of lumbar region   . Uses wheelchair    [phone] septic shock -- residual bilateral leg weakness w/ inability to walk  . Vitamin D deficiency     Past Surgical History:  Procedure Laterality Date  . ABDOMINAL EXPOSURE N/A 03/24/2015   Procedure: ABDOMINAL EXPOSURE;  Surgeon: [name], MD;  Location: [facility];  Service: Vascular;  Laterality: N/A;  left side approach  . ANTERIOR CERVICAL DECOMP/DISCECTOMY FUSION N/A 03/18/2016   Procedure: Cervical four-five Cervical five-six Cervical six-seven Anterior cervical decompression/diskectomy/fusion;  Surgeon: [name], MD;  Location: [facility];  Service: Neurosurgery;  Laterality: N/A;  n  . ANTERIOR LAT LUMBAR FUSION Right 03/24/2015   Procedure: Right Lumbar two-Three,Lumbar Three-Four,Lumbar Four-Five Anterior lateral lumbar interbody fusion ;  Surgeon: [name], MD;  Location: [facility];  Service: Neurosurgery;  Laterality: Right;  right  . COLONOSCOPY  approx 2011   Dr. [name] (normal per pt report)  . CYSTOSCOPY WITH RETROGRADE PYELOGRAM, URETEROSCOPY AND STENT PLACEMENT Left 08/15/2016   Procedure: CYSTOSCOPY WITH STENT REMOVAL,  RETROGRADES, URETEROSCOPY, STONE BASKETRY AND STENT REPLACEMENT;  Surgeon: [name],  MD;  Location: [facility];  Service: Urology;  Laterality: Left;  . CYSTOSCOPY WITH STENT PLACEMENT Left 07/16/2016   Procedure: CYSTOSCOPY, RETROGRADE WITH LEFT URETERAL STENT PLACEMENT;  Surgeon: [name], MD;  Location: [facility];  Service: Urology;  Laterality: Left;  . CYSTOSCOPY WITH STENT PLACEMENT Bilateral 02/27/2017   Procedure: CYSTOSCOPY WITH STENT PLACEMENT;  Surgeon: [name], MD;  [facility];  Service: Urology;  Laterality: Bilateral;  . CYSTOSCOPY/RETROGRADE/URETEROSCOPY/STONE EXTRACTION WITH BASKET Bilateral 02/27/2017   Procedure:  CYSTOSCOPY/RETROGRADE/URETEROSCOPY/STONE EXTRACTION WITH BASKET;  Surgeon: [name], MD;  Location: [facility];  Service: Urology;  Laterality: Bilateral;  . HOLMIUM LASER APPLICATION Left 67/04/1949   Procedure: HOLMIUM LASER;  Surgeon: [name], MD;  Location: [facility];  Service: Urology;  Laterality: Left;  . INGUINAL HERNIA REPAIR Bilateral 1993  . POSTERIOR LUMBAR FUSION 4 LEVEL N/A 03/27/2015   Procedure: Posterior pedicle subtraction osteotomy with T10 to Iliac fusion with Dr. [name] assisting;  Surgeon: [name], MD;  Location: [facility];  Service: Neurosurgery;  Laterality: N/A;  Posterior pedicle subtraction osteotomy with T10 to Iliac fusion with Dr. [name] assisting  . TONSILLECTOMY  1954    Outpatient Medications Prior to Visit  Medication Sig Dispense Refill  . alfuzosin (UROXATRAL) 10 MG 24 hr tablet Take 10 mg by mouth at bedtime.     Jolyne Loa Grape-Goldenseal (BERBERINE COMPLEX PO) Take 1 tablet by mouth 3 (three) times daily.    . Cholecalciferol (VITAMIN D3) 5000 units CAPS Take 1 capsule by mouth daily.    . fluticasone (FLONASE) 50 MCG/ACT nasal spray Place 2 sprays into both nostrils daily. 16 g 6  . Menaquinone-7 (VITAMIN K2 PO) Take 1 tablet by mouth daily.     . Multiple Vitamins-Minerals (MULTIVITAMIN WITH MINERALS) tablet Take 1 tablet by mouth daily.    . nitrofurantoin (MACRODANTIN) 100 MG capsule Take 50 mg by mouth daily.     Marland Kitchen OVER THE COUNTER MEDICATION OPC3 and Isochrome powders 1 capful of both mixed in 4 ounces of water daily Neuroquell - 1 cap daily Cardio Cover - 1 tab BID Vital Artery Cleanse - 1 cap daily Gluco-800 - 1 tab daily Chelation Plus Reveratrol - 3 tab daily Diabetic Nerve Regenerator - 3 cap daily D-Mannos - 1 tab BID    . Potassium Citrate (UROCIT-K 15) 15 MEQ (1620 MG) TBCR Take 1 tablet by mouth 2 (two) times daily.    . Probiotic Product (PROBIOTIC PO) Take 50 mg by mouth daily.     . riluzole (RILUTEK) 50 MG tablet Take 50 mg by mouth every 12 (twelve) hours. Per [facility] ALS specialist     No facility-administered medications prior to visit.     No Known Allergies  ROS As per HPI  PE: Blood pressure 131/81, pulse 82, temperature 97.9 F (36.6 C), temperature source Oral, resp. rate 20, SpO2 94 %. Gen: Alert, well appearing, sitting in wheelchair.  Patient is oriented to person, place, time, and situation. DTO:IZTI: no injection, icteris, swelling, or exudate.  EOMI, PERRLA.  Nose with mild turbinate injection and edema.  No nasal septal deviation and no purulent mucous.  No facial tenderness. Mouth: lips without lesion/swelling.  Oral mucosa pink and moist. Oropharynx without erythema, exudate, or swelling.  Neck - No masses or thyromegaly or limitation in range of motion CV: RRR, no m/r/g.   LUNGS: CTA bilat, nonlabored resps, good aeration in all lung fields. ABd;  soft, Nt/ND EXT: no clubbing or cyanosis.  no edema.    LABS:    Chemistry      Component Value Date/Time   NA 138 06/18/2018 1546   NA 134 (A) 12/06/2017   K 5.0 06/18/2018 1546   CL 100 06/18/2018 1546   CO2 30 06/18/2018 1546   BUN 24 (H) 06/18/2018 1546   BUN 22 (A) 03/06/2017   CREATININE 0.49 06/18/2018 1546   CREATININE 0.77 03/30/2012 1537   GLU 239 12/06/2017      Component Value Date/Time   CALCIUM 9.8 06/18/2018 1546   ALKPHOS 79 06/18/2018 1546   AST 20 06/18/2018 1546   ALT 24 06/18/2018 1546   BILITOT 0.4 06/18/2018 1546     Lab Results  Component Value Date   HGBA1C 7.8 (A) 09/21/2018   Lab Results  Component Value Date   TSH 1.52 04/16/2014     IMPRESSION AND PLAN:  1) DM 2, not ideal control. If A1c remains <8.0%, then we'll hold off from any diabetic med.  2) HTN: The current medical regimen is effective;  continue present plan and medications. Lytes/cr today.  3) Nasal congestion, more c/w vasomotor rhinitis as opposed to allergic rhinitis Start  trial of astepro 2 sprays each nostril q12h prn.  Saline nasal spray encouraged too. Continue cool mist humidifier. An After Visit Summary was printed and given to the patient.  4) ALS: slowly progressing. Next f/u with ALS clinic is next week.  FOLLOW UP: Return in about 4 months (around 05/26/2019) for routine chronic illness f/u.  Signed:  Crissie Sickles, MD           01/24/2019

## 2019-01-25 LAB — BASIC METABOLIC PANEL
BUN: 21 mg/dL (ref 6–23)
CO2: 29 mEq/L (ref 19–32)
CREATININE: 0.3 mg/dL — AB (ref 0.40–1.50)
Calcium: 9.4 mg/dL (ref 8.4–10.5)
Chloride: 97 mEq/L (ref 96–112)
GFR: 294.04 mL/min (ref >60.00)
Glucose, Bld: 227 mg/dL — ABNORMAL HIGH (ref 70–99)
POTASSIUM: 4.5 meq/L (ref 3.5–5.1)
Sodium: 135 mEq/L (ref 135–145)

## 2019-01-25 LAB — HEMOGLOBIN A1C: Hgb A1c MFr Bld: 7.6 % — ABNORMAL HIGH (ref 4.6–6.5)

## 2019-01-28 ENCOUNTER — Telehealth: Payer: Self-pay

## 2019-01-28 NOTE — Telephone Encounter (Signed)
Mychart message sent.

## 2019-01-31 DIAGNOSIS — E119 Type 2 diabetes mellitus without complications: Secondary | ICD-10-CM | POA: Diagnosis not present

## 2019-01-31 DIAGNOSIS — M21371 Foot drop, right foot: Secondary | ICD-10-CM | POA: Diagnosis not present

## 2019-01-31 DIAGNOSIS — H8110 Benign paroxysmal vertigo, unspecified ear: Secondary | ICD-10-CM | POA: Diagnosis not present

## 2019-01-31 DIAGNOSIS — M21372 Foot drop, left foot: Secondary | ICD-10-CM | POA: Diagnosis not present

## 2019-01-31 DIAGNOSIS — G1221 Amyotrophic lateral sclerosis: Secondary | ICD-10-CM | POA: Diagnosis not present

## 2019-01-31 DIAGNOSIS — I10 Essential (primary) hypertension: Secondary | ICD-10-CM

## 2019-02-06 ENCOUNTER — Telehealth: Payer: Self-pay | Admitting: Family Medicine

## 2019-02-06 NOTE — Telephone Encounter (Signed)
Patient called and left a message stating that he needed to see what pharmacy his medication were sent to and discuss what medications were sent. Called and left message [phone] (mobile) for patient to call back to elaborate on which pharmacy and which medications

## 2019-02-08 NOTE — Telephone Encounter (Signed)
Patient returned call and left a message, called [phone] (mobile) no answer and left voicemail

## 2019-02-08 NOTE — Telephone Encounter (Signed)
Azelastine HCl (ASTEPRO) 0.15 % [facility] - [place], [place] - [address] [place] [phone] (Phone) [phone] (Fax)   Pt called back for [place] Azelastine nasal spray. [facility] has [facility] that it has been [facility] now and nothing further is needed.

## 2019-02-11 ENCOUNTER — Telehealth: Payer: Self-pay | Admitting: Family Medicine

## 2019-02-11 NOTE — Telephone Encounter (Signed)
Patient left VM stating he is having UTI symptoms. He has called his urologist. They will test his urine. Patient would like to know if we have a cup he can use.

## 2019-02-12 DIAGNOSIS — N3000 Acute cystitis without hematuria: Secondary | ICD-10-CM | POA: Diagnosis not present

## 2019-02-12 NOTE — Telephone Encounter (Signed)
Someone can come by here for him and pick up a urine cup.  If he can return the specimen back right away that would be best.  If he has to wait more than 30 min to return it, then refrigerate specimen and bring in to our office as soon as he can. Does he want me to call in an antibiotic or does he want to let his urologist handle that? Tell him I'll do whatever works best for him.-thx

## 2019-02-12 NOTE — Telephone Encounter (Signed)
OK thx  

## 2019-02-12 NOTE — Telephone Encounter (Signed)
FYI, Urologist office did call pt back and he was able to have someone get urine cup and return sample to their office. He will have them handle this for him. Will let his know if anything further needed.

## 2019-02-12 NOTE — Telephone Encounter (Signed)
Spoke with patient and he does not want to leave the house due to [place] and being in a wheelchair. His urologist office was supposed to call him this morning. He wanted to know if we could provide him with a cup.   Please advise, thanks.

## 2019-02-19 ENCOUNTER — Ambulatory Visit: Payer: Medicare Other | Admitting: Podiatry

## 2019-04-10 ENCOUNTER — Telehealth: Payer: Self-pay | Admitting: Family Medicine

## 2019-04-10 NOTE — Telephone Encounter (Signed)
Patient's brother dropped off Disability Parking Placard to complete. Please mail form to patient when complete. Thanks

## 2019-04-12 NOTE — Telephone Encounter (Signed)
Signed form and put on your desk.

## 2019-04-12 NOTE — Telephone Encounter (Signed)
Given to provider for review. Will mail to pt once completed

## 2019-04-12 NOTE — Telephone Encounter (Signed)
Pt was notified form completed and mailed back to him. Advised he should receive in the next 2 weeks.

## 2019-05-22 ENCOUNTER — Ambulatory Visit: Payer: Medicare Other | Admitting: Podiatry

## 2019-05-30 ENCOUNTER — Ambulatory Visit: Payer: Medicare Other | Admitting: Family Medicine

## 2019-06-21 ENCOUNTER — Telehealth: Payer: Self-pay | Admitting: Family Medicine

## 2019-06-21 NOTE — Telephone Encounter (Signed)
Colace sometimes takes a couple days. I recommend he take over the counter miralax powder, 1 capful twice a day until he has a couple of BMs, then can stop it and use it as needed in the future.  He can still take the colace but doesn't have to.-thx

## 2019-06-21 NOTE — Telephone Encounter (Signed)
I need to know what has been tried first.  That will help me choose what to recommend next.-thx

## 2019-06-21 NOTE — Telephone Encounter (Signed)
Patient started Colace OTC yesterday with no relief. Patient is normally very regular.

## 2019-06-21 NOTE — Telephone Encounter (Signed)
Patient's daughter called, left VM on front desk. Patient is experiencing constipation. Can an Rx be sent in for him?

## 2019-06-21 NOTE — Telephone Encounter (Signed)
Pts daughter was called and given instructions. She verbalized understanding

## 2019-06-21 NOTE — Telephone Encounter (Signed)
Please advise, thanks.

## 2019-07-06 ENCOUNTER — Encounter: Payer: Self-pay | Admitting: Family Medicine

## 2019-07-07 ENCOUNTER — Emergency Department (HOSPITAL_COMMUNITY)
Admission: EM | Admit: 2019-07-07 | Discharge: 2019-07-16 | Disposition: E | Payer: Medicare Other | Attending: Emergency Medicine | Admitting: Emergency Medicine

## 2019-07-07 ENCOUNTER — Encounter (HOSPITAL_COMMUNITY): Payer: Self-pay

## 2019-07-07 ENCOUNTER — Emergency Department (HOSPITAL_COMMUNITY): Payer: Medicare Other

## 2019-07-07 DIAGNOSIS — R4020 Unspecified coma: Secondary | ICD-10-CM | POA: Diagnosis not present

## 2019-07-07 DIAGNOSIS — Z515 Encounter for palliative care: Secondary | ICD-10-CM | POA: Insufficient documentation

## 2019-07-07 DIAGNOSIS — Z79899 Other long term (current) drug therapy: Secondary | ICD-10-CM | POA: Insufficient documentation

## 2019-07-07 DIAGNOSIS — Z87891 Personal history of nicotine dependence: Secondary | ICD-10-CM | POA: Diagnosis not present

## 2019-07-07 DIAGNOSIS — E119 Type 2 diabetes mellitus without complications: Secondary | ICD-10-CM | POA: Diagnosis not present

## 2019-07-07 DIAGNOSIS — R4182 Altered mental status, unspecified: Secondary | ICD-10-CM | POA: Diagnosis not present

## 2019-07-07 DIAGNOSIS — J9602 Acute respiratory failure with hypercapnia: Secondary | ICD-10-CM | POA: Diagnosis not present

## 2019-07-07 DIAGNOSIS — I10 Essential (primary) hypertension: Secondary | ICD-10-CM | POA: Insufficient documentation

## 2019-07-07 DIAGNOSIS — J9601 Acute respiratory failure with hypoxia: Secondary | ICD-10-CM | POA: Diagnosis not present

## 2019-07-07 DIAGNOSIS — R0602 Shortness of breath: Secondary | ICD-10-CM | POA: Diagnosis not present

## 2019-07-07 DIAGNOSIS — I9589 Other hypotension: Secondary | ICD-10-CM | POA: Diagnosis not present

## 2019-07-07 DIAGNOSIS — R404 Transient alteration of awareness: Secondary | ICD-10-CM | POA: Diagnosis not present

## 2019-07-07 DIAGNOSIS — Z20828 Contact with and (suspected) exposure to other viral communicable diseases: Secondary | ICD-10-CM | POA: Diagnosis not present

## 2019-07-07 DIAGNOSIS — G1221 Amyotrophic lateral sclerosis: Secondary | ICD-10-CM | POA: Insufficient documentation

## 2019-07-07 DIAGNOSIS — Z209 Contact with and (suspected) exposure to unspecified communicable disease: Secondary | ICD-10-CM | POA: Diagnosis not present

## 2019-07-07 DIAGNOSIS — R069 Unspecified abnormalities of breathing: Secondary | ICD-10-CM | POA: Diagnosis not present

## 2019-07-07 LAB — COMPREHENSIVE METABOLIC PANEL
ALT: 32 U/L (ref 0–44)
AST: 35 U/L (ref 15–41)
Albumin: 3.9 g/dL (ref 3.5–5.0)
Alkaline Phosphatase: 99 U/L (ref 38–126)
Anion gap: 13 (ref 5–15)
BUN: 12 mg/dL (ref 8–23)
CO2: 26 mmol/L (ref 22–32)
Calcium: 9 mg/dL (ref 8.9–10.3)
Chloride: 97 mmol/L — ABNORMAL LOW (ref 98–111)
Creatinine, Ser: 0.33 mg/dL — ABNORMAL LOW (ref 0.61–1.24)
GFR calc Af Amer: >60 mL/min (ref >60)
GFR calc non Af Amer: >60 mL/min (ref >60)
Glucose, Bld: 325 mg/dL — ABNORMAL HIGH (ref 70–99)
Potassium: 4.8 mmol/L (ref 3.5–5.1)
Sodium: 136 mmol/L (ref 135–145)
Total Bilirubin: 0.6 mg/dL (ref 0.3–1.2)
Total Protein: 7.2 g/dL (ref 6.5–8.1)

## 2019-07-07 LAB — CBC
HCT: 50.1 % (ref 39.0–52.0)
Hemoglobin: 15.3 g/dL (ref 13.0–17.0)
MCH: 29.4 pg (ref 26.0–34.0)
MCHC: 30.5 g/dL (ref 30.0–36.0)
MCV: 96.2 fL (ref 80.0–100.0)
Platelets: 304 10*3/uL (ref 150–400)
RBC: 5.21 MIL/uL (ref 4.22–5.81)
RDW: 13 % (ref 11.5–15.5)
WBC: 16.5 10*3/uL — ABNORMAL HIGH (ref 4.0–10.5)
nRBC: 0 % (ref 0.0–0.2)

## 2019-07-07 LAB — TROPONIN I (HIGH SENSITIVITY): Troponin I (High Sensitivity): 17 ng/L

## 2019-07-07 LAB — LACTIC ACID, PLASMA: Lactic Acid, Venous: 1.8 mmol/L (ref 0.5–1.9)

## 2019-07-07 MED ORDER — SODIUM CHLORIDE 0.9 % IV BOLUS
1000.0000 mL | Freq: Once | INTRAVENOUS | Status: AC
  Administered 2019-07-07: 1000 mL via INTRAVENOUS

## 2019-07-07 MED ORDER — SODIUM CHLORIDE 0.9 % IV SOLN: Freq: Once | INTRAVENOUS | Status: DC

## 2019-07-07 MED ORDER — MORPHINE SULFATE (PF) 2 MG/ML IV SOLN: 2.0000 mg | INTRAVENOUS | Status: DC | PRN

## 2019-07-07 MED ORDER — LORAZEPAM 2 MG/ML PO CONC: 1.0000 mg | ORAL | Status: DC | PRN

## 2019-07-07 MED ORDER — LORAZEPAM 1 MG PO TABS: 1.0000 mg | ORAL_TABLET | ORAL | Status: DC | PRN

## 2019-07-07 MED ORDER — HALOPERIDOL 1 MG PO TABS: 0.5000 mg | ORAL_TABLET | ORAL | Status: DC | PRN

## 2019-07-07 MED ORDER — SODIUM CHLORIDE 0.9 % IV SOLN
500.0000 mg | INTRAVENOUS | Status: DC
  Administered 2019-07-07: 500 mg via INTRAVENOUS
  Filled 2019-07-07: qty 500

## 2019-07-07 MED ORDER — SODIUM CHLORIDE 0.9% FLUSH: 3.0000 mL | Freq: Two times a day (BID) | INTRAVENOUS | Status: DC

## 2019-07-07 MED ORDER — LORAZEPAM 2 MG/ML IJ SOLN: 1.0000 mg | INTRAMUSCULAR | Status: DC | PRN

## 2019-07-07 MED ORDER — ACETAMINOPHEN 325 MG PO TABS: 650.0000 mg | ORAL_TABLET | Freq: Four times a day (QID) | ORAL | Status: DC | PRN

## 2019-07-07 MED ORDER — SODIUM CHLORIDE 0.9 % IV SOLN
Freq: Once | INTRAVENOUS | Status: AC
  Administered 2019-07-07: 500 mL via INTRAVENOUS

## 2019-07-07 MED ORDER — ACETAMINOPHEN 650 MG RE SUPP: 650.0000 mg | Freq: Four times a day (QID) | RECTAL | Status: DC | PRN

## 2019-07-07 MED ORDER — BIOTENE DRY MOUTH MT LIQD: 15.0000 mL | OROMUCOSAL | Status: DC | PRN

## 2019-07-07 MED ORDER — HALOPERIDOL LACTATE 2 MG/ML PO CONC
0.5000 mg | ORAL | Status: DC | PRN
  Filled 2019-07-07: qty 0.3

## 2019-07-07 MED ORDER — SODIUM CHLORIDE 0.9 % IV BOLUS
500.0000 mL | Freq: Once | INTRAVENOUS | Status: AC
  Administered 2019-07-07: 500 mL via INTRAVENOUS

## 2019-07-07 MED ORDER — GLYCOPYRROLATE 0.2 MG/ML IJ SOLN: 0.6000 mg | Freq: Four times a day (QID) | INTRAMUSCULAR | Status: DC

## 2019-07-07 MED ORDER — SODIUM CHLORIDE 0.9% FLUSH: 3.0000 mL | INTRAVENOUS | Status: DC | PRN

## 2019-07-07 MED ORDER — SODIUM CHLORIDE 0.9 % IV SOLN
2.0000 g | INTRAVENOUS | Status: DC
  Administered 2019-07-07: 2 g via INTRAVENOUS
  Filled 2019-07-07: qty 20

## 2019-07-07 MED ORDER — GLYCOPYRROLATE 1 MG PO TABS
1.0000 mg | ORAL_TABLET | Freq: Four times a day (QID) | ORAL | Status: DC
  Filled 2019-07-07 (×3): qty 1

## 2019-07-07 MED ORDER — POLYVINYL ALCOHOL 1.4 % OP SOLN
1.0000 [drp] | Freq: Four times a day (QID) | OPHTHALMIC | Status: DC | PRN
  Filled 2019-07-07: qty 15

## 2019-07-07 MED ORDER — SODIUM CHLORIDE 0.9 % IV SOLN: 250.0000 mL | INTRAVENOUS | Status: DC | PRN

## 2019-07-07 MED ORDER — HALOPERIDOL LACTATE 5 MG/ML IJ SOLN: 0.5000 mg | INTRAMUSCULAR | Status: DC | PRN

## 2019-07-07 MED ORDER — GLYCOPYRROLATE 0.2 MG/ML IJ SOLN: 0.2000 mg | Freq: Four times a day (QID) | INTRAMUSCULAR | Status: DC

## 2019-07-08 LAB — NOVEL CORONAVIRUS, NAA (HOSP ORDER, SEND-OUT TO REF LAB; TAT 18-24 HRS): SARS-CoV-2, NAA: NOT DETECTED

## 2019-07-08 NOTE — Telephone Encounter (Signed)
Pt is deceased. 

## 2019-07-08 NOTE — Telephone Encounter (Signed)
LM for Ethan Rose to call back regarding OV notes and test results. Patient's chart was searched and nothing found from that date.

## 2019-07-08 NOTE — Telephone Encounter (Signed)
Pts daughter called stating they are no longer needing the information as pt passed away yesterday. Please advise.

## 2019-07-09 ENCOUNTER — Telehealth: Payer: Self-pay | Admitting: Family Medicine

## 2019-07-09 NOTE — Telephone Encounter (Signed)
FYI Death Certificate was dropped off from funeral home late yesterday afternoon, close to closing time.  Dr. Raoul Pitch cannot sign as she has never seen patient.   I called and spoke to someone with their answering service at [facility] in [place]. She will have someone call me back.  Thank you

## 2019-07-12 LAB — CULTURE, BLOOD (ROUTINE X 2)
Culture: NO GROWTH
Culture: NO GROWTH
Special Requests: ADEQUATE

## 2019-07-16 NOTE — ED Notes (Addendum)
Pt placed back on NRB at direction of RT after sat noted at 79%

## 2019-07-16 NOTE — Consult Note (Signed)
Consultation Note Date: 2019/07/13   Patient Name: Ethan Rose  DOB: 04-03-1946  MRN: [id number]  Age / Sex: 73 y.o., male  PCP: Tammi Sou, MD Referring Physician: Lajean Saver, MD  Reason for Consultation: Establishing goals of care, Non pain symptom management and Psychosocial/spiritual support  HPI/Patient Profile: 73 y.o. male  with past medical history of ALS diagnosed in 2018, DM and cervical myelopathy who was admitted on 07-13-2019 with respiratory distress secondary to end stage ALS.   PMT was consulted by the ER physician for possible end of life.  Clinical Assessment and Goals of Care:  I have reviewed medical records including EPIC notes, labs and imaging, received report from Dr. Di Kindle, spoke with his daughter [name] [facility]) and her nephew [name] on the phone.  [name] was in the ER with his Uncle.  We discussed diagnosis prognosis, GOC, EOL wishes, disposition and options.  I introduced Palliative Medicine as specialized medical care for people living with serious illness. It focuses on providing relief from the symptoms and stress of a serious illness.   We discussed his current illness and what it means in the larger context of his on-going co-morbidities.  Natural disease trajectory and expectations at EOL were discussed.  Specifically Mr. Swanner is very ill.  His systolic BP is in the 0000000 and 70s, and his oxygen saturations are 76% even on 15L with NRB mask.    We talked about aggressive care vs a comfort path. I expressed a fear that even with an aggressive path I felt that we would be back in this position again very quickly.  I feel that his ALS is finally overwhelming.  I attempted to elicit values and goals of care important to the patient.  [name] explained that she and her father had detailed conversation just 3 weeks ago.  They discussed his wish to be a DNR and never  have a tracheostomy.  He also told her that his quality of life was bad and his mind is not what he wants it to be.    [name] and [name] agreed that we should focus on his comfort.  They understand he will pass away soon.  They are hopeful [name] will make it to the hospital before he passes.   Primary Decision Maker:  [name]    SUMMARY OF RECOMMENDATIONS   Admit for comfort care.  Will place comfort orders Family appreciates chaplain support. Anticipate hospital death - likely in minutes to possibly hours.  Code Status/Advance Care Planning:  DNR  Additional Recommendations (Limitations, Scope, Preferences):  Full Comfort Care  Palliative Prophylaxis:   Frequent Pain Assessment  Psycho-social/Spiritual:   Desire for further Chaplaincy support: yes  Prognosis:  Min to hours    Discharge Planning: Anticipated Hospital Death      Primary Diagnoses: Present on Admission: **None**   I have reviewed the medical record, interviewed the patient and family, and examined the patient. The following aspects are pertinent.  Past Medical History:  Diagnosis Date  . Allergic  rhinitis   . Amyotrophic lateral sclerosis (ALS) ([place]) 05/2017   Dr. Scarlette Slice Caress--[employer] ALS clinic.  Using BiPap   . At high risk for falls   . Bilateral leg weakness    uses wheelchair  . BPH with obstruction/lower urinary tract symptoms   . BPPV (benign paroxysmal positional vertigo) 03/30/2012  . Carpal tunnel syndrome   . Cervical myelopathy ([place])   . Chronic low back pain   . Degenerative spinal arthritis   . Diabetes mellitus without complication ([place]) 99991111  . Essential hypertension    hx of : bp good off meds 2018/2019  . Foot drop, bilateral    multiple back sx's  (does use AFO's , but does have them)  . GERD (gastroesophageal reflux disease)   . History of cardiac murmur as a child   . History of gout    left hand  . History of kidney stones     since 1997  . History of sepsis    09/ 2017 urosepsis  . History of squamous cell carcinoma excision    right thigh 03/ 2016  . Inability to walk    dx'd with ALS  . Olecranon bursitis March 31, 202017   Dr. Percell Miller  . Renal calculi    bilateral   . Slow transit constipation   . Spondylolisthesis of lumbar region   . Uses wheelchair    [phone] septic shock -- residual bilateral leg weakness w/ inability to walk  . Vitamin D deficiency    Social History   Socioeconomic History  . Marital status: Widowed    Spouse name: Not on file  . Number of children: Not on file  . Years of education: Not on file  . Highest education level: Not on file  Occupational History  . Not on file  Social Needs  . Financial resource strain: Not on file  . Food insecurity    Worry: Not on file    Inability: Not on file  . Transportation needs    Medical: Not on file    Non-medical: Not on file  Tobacco Use  . Smoking status: Former Smoker    Packs/day: 1.00    Years: 50.00    Pack years: 50.00    Types: Cigarettes    Quit date: 02/12/2014    Years since quitting: 5.4  . Smokeless tobacco: Never Used  Substance and Sexual Activity  . Alcohol use: Yes    Alcohol/week: 8.0 standard drinks    Types: 8 Cans of beer per week    Comment: 1-2 beers q day   . Drug use: No  . Sexual activity: Never  Lifestyle  . Physical activity    Days per week: Not on file    Minutes per session: Not on file  . Stress: Not on file  Relationships  . Social Herbalist on phone: Not on file    Gets together: Not on file    Attends religious service: Not on file    Active member of club or organization: Not on file    Attends meetings of clubs or organizations: Not on file    Relationship status: Not on file  Other Topics Concern  . Not on file  Social History Narrative   Widower as of 59 (wife [name] d. MI), has one daughter and one grandson.   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   Orig from  [place].  Has lived in [place] x 25 yrs.  Tobacco 50 pack-yr hx--Quit 02/2014.     Alcohol: 2 beers per day avg.  No drugs.   Exercise: none.  Has active job/physical labor.   Admitted to [facility] 03/2016   FULL CODE   Family History  Problem Relation Age of Onset  . Stroke Mother   . Diabetes Mother   . Heart disease Mother   . Hypertension Mother   . Heart attack Mother   . Stroke Father   . Heart disease Father   . Diabetes Brother   . Hypertension Brother    Scheduled Meds: Continuous Infusions: . azithromycin 500 mg (2019/08/03 1317)  . cefTRIAXone (ROCEPHIN)  IV 2 g (2019-08-03 1313)   PRN Meds:. No Known Allergies   Vital Signs: BP (!) 78/43   Pulse 80   Temp 97.8 F (36.6 C) (Oral)   Resp 19   Ht 5\' 11"  (1.803 m)   Wt 71 kg   SpO2 (!) 83%   BMI 21.83 kg/m  Pain Scale: Faces       SpO2: SpO2: (!) 83 % O2 Device:SpO2: (!) 83 % O2 Flow Rate: .O2 Flow Rate (L/min): 40 L/min  IO: Intake/output summary:   Intake/Output Summary (Last 24 hours) at 03-Aug-2019 1408 Last data filed at 08/03/2019 1349 Gross per 24 hour  Intake 1000 ml  Output -  Net 1000 ml    LBM:   Baseline Weight: Weight: 71 kg Most recent weight: Weight: 71 kg     Palliative Assessment/Data: 10%     Time In: 1:00 Time Out: 2:00 Time Total: 60 min Visit consisted of counseling and education dealing with the complex and emotionally intense issues surrounding the need for palliative care and symptom management in the setting of serious and potentially life-threatening illness. Greater than 50%  of this time was spent counseling and coordinating care related to the above assessment and plan.  The above conversation was completed via telephone due to the restrictions during the COVID-19 pandemic. Thorough chart review and discussion with necessary members of the care team was completed as part of assessment. All issues were discussed and addressed but no physical exam was performed.  Signed by:  Florentina Jenny, PA-C Palliative Medicine Pager: [phone]  Please contact Palliative Medicine Team phone at [phone] for questions and concerns.  For individual provider: See Shea Evans

## 2019-07-16 NOTE — ED Notes (Signed)
Pt DNR armband placement verified with Felix Ahmadi, RN.

## 2019-07-16 NOTE — Progress Notes (Signed)
RT assessed pt, pt is unresponsive and is not a candidate for BiPAP.

## 2019-07-16 NOTE — Telephone Encounter (Signed)
Placed on PCP desk for completion  

## 2019-07-16 NOTE — Consult Note (Signed)
Responded to page to visit family again when pt's brother and another grandson had arrived to join grandson [name] bedside. Provided spiritual/emotional support & prayer, while pt was actively dying. Family all recited Lord's prayer with me around pt, which they said pt loved, & I prayed 23rd Psalm with them. Left [name] copy of Victor from which we prayed. We agreed pt now seemed peaceful. Referred nurse who called after pt's death to palliative or women's to obtain materials to make pt's hand prints.  Rev. Eloise Levels Chaplain

## 2019-07-16 NOTE — ED Notes (Signed)
Daughter on phone and speaking with Dr. Ashok Cordia.

## 2019-07-16 NOTE — ED Notes (Signed)
Pt's time of death 73.

## 2019-07-16 NOTE — ED Notes (Signed)
Chaplain at pt bedside with family.

## 2019-07-16 NOTE — ED Notes (Signed)
Grandson at bedside with gown , n95 and surg mask.

## 2019-07-16 NOTE — ED Triage Notes (Signed)
Pt arrives [facility] from home with c/o increased [place] since last night. First responders noted o2 sat 30-40. NRB at 15 l up to 84%. Pt has hx of ALS.

## 2019-07-16 NOTE — Consult Note (Signed)
Responded to page, met with pt's grandson Manning, whom pt raised, providing emotional/spiritual/grief support & prayer. Other family members are on the way, Jaevon's wife from [place], pt's step-daughter & husband, pt's brother & possibly others (cousin?). Staff will page again if further chaplain services desired.   Rev. Eloise Levels Chaplain

## 2019-07-16 NOTE — ED Provider Notes (Addendum)
[facility] EMERGENCY DEPARTMENT Provider Note   CSN: [id number] Arrival date & time: 07-19-19  1135     History   Chief Complaint Chief Complaint  Patient presents with  . Shortness of Breath    HPI Ethan Rose is a 73 y.o. male.     Patient with hx ALS, presents with increased trouble breathing, altered mental status via EMS. Pt unable to provide additional history - level 5 caveat. Family member/grandson notes not ambulatory at baseline, and that in past several days, increased sob/difficulty breathing especially when upright. They also note increased trouble handling secretions and clearing throat. No fevers. No known covid+ exposure. Pt does not leave home. Occasional non prod cough. Relatively poor po intake.   The history is provided by the patient, the EMS personnel and a relative. The history is limited by the condition of the patient.  Shortness of Breath   Past Medical History:  Diagnosis Date  . Allergic rhinitis   . Amyotrophic lateral sclerosis (ALS) ([place]) 05/2017   Dr. Scarlette Slice Caress--[facility] [employer] [facility] ALS clinic.  Using BiPap   . At high risk for falls   . Bilateral leg weakness    uses wheelchair  . BPH with obstruction/lower urinary tract symptoms   . BPPV (benign paroxysmal positional vertigo) 03/30/2012  . Carpal tunnel syndrome   . Cervical myelopathy ([place])   . Chronic low back pain   . Degenerative spinal arthritis   . Diabetes mellitus without complication ([place]) 99991111  . Essential hypertension    hx of : bp good off meds 2018/2019  . Foot drop, bilateral    multiple back sx's  (does use AFO's , but does have them)  . GERD (gastroesophageal reflux disease)   . History of cardiac murmur as a child   . History of gout    left hand  . History of kidney stones    since 1997  . History of sepsis    09/ 2017 urosepsis  . History of squamous cell carcinoma excision    right thigh 03/ 2016  . Inability to walk    dx'd  with ALS  . Olecranon bursitis 09-Oct-202017   Dr. Percell Miller  . Renal calculi    bilateral   . Slow transit constipation   . Spondylolisthesis of lumbar region   . Uses wheelchair    [phone] septic shock -- residual bilateral leg weakness w/ inability to walk  . Vitamin D deficiency     Patient Active Problem List   Diagnosis Date Noted  . ALS (amyotrophic lateral sclerosis) ([place]) 06/14/2017  . Lumbar radiculopathy 11/29/2016  . Hypocalcemia 10/26/2016  . Ureteral stone with hydronephrosis 08/10/2016  . Debility 07/22/2016  . Pressure ulcer 07/18/2016  . E coli bacteremia   . Leukocytosis   . Pyelonephritis 07/16/2016  . Left ureteral stone 07/16/2016  . Cervical myelopathy ([place]) 03/18/2016  . 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 Other malaise and fatigue 05/14/2014  . Health maintenance examination 04/29/2013  . Weakness of both legs 04/29/2013  . Leg cramps 04/29/2013  . Elevated blood pressure reading without diagnosis of hypertension 03/30/2012  . BPPV (benign paroxysmal positional vertigo) 03/30/2012    Past Surgical History:  Procedure Laterality Date  . ABDOMINAL EXPOSURE N/A 03/24/2015   Procedure: ABDOMINAL EXPOSURE;  Surgeon: [name], MD;  Location: [facility];  Service: Vascular;  Laterality:  N/A;  left side approach  . ANTERIOR CERVICAL DECOMP/DISCECTOMY FUSION N/A 03/18/2016   Procedure: Cervical four-five Cervical five-six Cervical six-seven Anterior cervical decompression/diskectomy/fusion;  Surgeon: [name], MD;  Location: [facility];  Service: Neurosurgery;  Laterality: N/A;  n  . ANTERIOR LAT LUMBAR FUSION Right 03/24/2015   Procedure: Right Lumbar two-Three,Lumbar Three-Four,Lumbar Four-Five Anterior lateral lumbar interbody fusion ;  Surgeon: [name], MD;  Location: [facility] NEURO ORS;  Service: Neurosurgery;  Laterality:  Right;  right  . COLONOSCOPY  approx 2011   Dr. [name] (normal per pt report)  . CYSTOSCOPY WITH RETROGRADE PYELOGRAM, URETEROSCOPY AND STENT PLACEMENT Left 08/15/2016   Procedure: CYSTOSCOPY WITH STENT REMOVAL,  RETROGRADES, URETEROSCOPY, STONE BASKETRY AND STENT REPLACEMENT;  Surgeon: [name], MD;  Location: [facility]S;  Service: Urology;  Laterality: Left;  . CYSTOSCOPY WITH STENT PLACEMENT Left 07/16/2016   Procedure: CYSTOSCOPY, RETROGRADE WITH LEFT URETERAL STENT PLACEMENT;  Surgeon: [name], MD;  Location: [facility] OR[facility];  Service: Urology;  Laterality: Left;  . CYSTOSCOPY WITH STENT PLACEMENT Bilateral 02/27/2017   Procedure: CYSTOSCOPY WITH STENT PLACEMENT;  Surgeon: [name], MD;  Location[facility];  Service: Urology;  Laterality: Bilateral;  . CYSTOSCOPY/RETROGRADE/URETEROSCOPY/STONE EXTRACTION WITH BASKET Bilateral 02/27/2017   Procedure: CYSTOSCOPY/RETROGRADE/URETEROSCOPY/STONE EXTRACTION WITH BASKET;  Surgeon: [name], MD;  Location: [facility];  Service: Urology;  Laterality: Bilateral;  . HOLMIUM LASER APPLICATION Left [id number]   Procedure: HOLMIUM LASER;  Surgeon: [name], MD;  Location: [facility] ORS;  Service: Urology;  Laterality: Left;  . INGUINAL HERNIA REPAIR Bilateral 1993  . POSTERIOR LUMBAR FUSION 4 LEVEL N/A 03/27/2015   Procedure: Posterior pedicle subtraction osteotomy with T10 to Iliac fusion with Dr. [name] assisting;  Surgeon: [name], MD;  Location: [facility] NEURO ORS;  Service: Neurosurgery;  Laterality: N/A;  Posterior pedicle subtraction osteotomy with T10 to Iliac fusion with Dr. [name] assisting  . TONSILLECTOMY  1954        Home Medications    Prior to Admission medications   Medication Sig Start Date End Date Taking? Authorizing Provider  alfuzosin (UROXATRAL) 10 MG 24 hr tablet Take 10 mg by mouth at bedtime.  10/23/16   [provider]  Azelastine HCl (ASTEPRO)  0.15 % SOLN 2 sprays each nostril q12h prn 01/24/19   McGowen, Adrian Blackwater, MD  Barberry-Oreg Grape-Goldenseal (BERBERINE COMPLEX PO) Take 1 tablet by mouth 3 (three) times daily.    [provider]  Cholecalciferol (VITAMIN D3) 5000 units CAPS Take 1 capsule by mouth daily.    [provider]  fluticasone (FLONASE) 50 MCG/ACT nasal spray Place 2 sprays into both nostrils daily. 09/21/18   McGowen, Adrian Blackwater, MD  Menaquinone-7 (VITAMIN K2 PO) Take 1 tablet by mouth daily.     [provider]  Multiple Vitamins-Minerals (MULTIVITAMIN WITH MINERALS) tablet Take 1 tablet by mouth daily.    [provider]  nitrofurantoin (MACRODANTIN) 100 MG capsule Take 50 mg by mouth daily.     [provider]  OVER THE COUNTER MEDICATION OPC3 and Isochrome powders 1 capful of both mixed in 4 ounces of water daily Neuroquell - 1 cap daily Cardio Cover - 1 tab BID Vital Artery Cleanse - 1 cap daily Gluco-800 - 1 tab daily Chelation Plus Reveratrol - 3 tab daily Diabetic Nerve Regenerator - 3 cap daily D-Mannos - 1 tab BID    [provider]  Potassium Citrate (UROCIT-K 15) 15 MEQ (  1620 MG) TBCR Take 1 tablet by mouth 2 (two) times daily.    [provider]  Probiotic Product (PROBIOTIC PO) Take 50 mg by mouth daily.    [provider]  riluzole (RILUTEK) 50 MG tablet Take 50 mg by mouth every 12 (twelve) hours. Per [facility] ALS specialist    [provider]    Family History Family History  Problem Relation Age of Onset  . Stroke Mother   . Diabetes Mother   . Heart disease Mother   . Hypertension Mother   . Heart attack Mother   . Stroke Father   . Heart disease Father   . Diabetes Brother   . Hypertension Brother     Social History Social History   Tobacco Use  . Smoking status: Former Smoker    Packs/day: 1.00    Years: 50.00    Pack years: 50.00    Types: Cigarettes    Quit date: 02/12/2014    Years since quitting: 5.4   . Smokeless tobacco: Never Used  Substance Use Topics  . Alcohol use: Yes    Alcohol/week: 8.0 standard drinks    Types: 8 Cans of beer per week    Comment: 1-2 beers q day   . Drug use: No     Allergies   Patient has no known allergies.   Review of Systems Review of Systems  Unable to perform ROS: Patient unresponsive  Respiratory: Positive for shortness of breath.   level 5 caveat - pt unresponsive.     Physical Exam Updated Vital Signs BP (!) 78/43   Pulse 80   Temp 97.8 F (36.6 C) (Oral)   Resp 19   Ht 1.803 m (5\' 11" )   Wt 71 kg   SpO2 (!) 83%   BMI 21.83 kg/m   Physical Exam Vitals signs and nursing note reviewed.  Constitutional:      General: He is in acute distress.     Appearance: Normal appearance. He is well-developed. He is ill-appearing.     Comments: Very weak, frail appearing, in resp distress.   HENT:     Head: Atraumatic.     Nose: Nose normal.     Mouth/Throat:     Mouth: Mucous membranes are moist.     Pharynx: Oropharynx is clear.  Eyes:     General: No scleral icterus.    Conjunctiva/sclera: Conjunctivae normal.     Pupils: Pupils are equal, round, and reactive to light.  Neck:     Musculoskeletal: Normal range of motion and neck supple. No neck rigidity.     Trachea: No tracheal deviation.  Cardiovascular:     Rate and Rhythm: Normal rate and regular rhythm.     Pulses: Normal pulses.     Heart sounds: Normal heart sounds. No murmur. No friction rub. No gallop.   Pulmonary:     Effort: Respiratory distress present. No accessory muscle usage.     Comments: Very poor respiratory effort. Poor air exchange.  Abdominal:     General: Bowel sounds are normal. There is no distension.     Palpations: Abdomen is soft.     Tenderness: There is no abdominal tenderness. There is no guarding.  Genitourinary:    Comments: No cva tenderness. Musculoskeletal:        General: No swelling.  Skin:    General: Skin is warm and dry.      Findings: No rash.  Neurological:     Mental Status: He  is alert.     Comments: Patient unresponsive verbally. Lethargic appearing.   Psychiatric:     Comments: Unresponsive.       ED Treatments / Results  Labs (all labs ordered are listed, but only abnormal results are displayed) Results for orders placed or performed during the hospital encounter of Jul 31, 2019  CBC  Result Value Ref Range   WBC 16.5 (H) 4.0 - 10.5 K/uL   RBC 5.21 4.22 - 5.81 MIL/uL   Hemoglobin 15.3 13.0 - 17.0 g/dL   HCT 50.1 39.0 - 52.0 %   MCV 96.2 80.0 - 100.0 fL   MCH 29.4 26.0 - 34.0 pg   MCHC 30.5 30.0 - 36.0 g/dL   RDW 13.0 11.5 - 15.5 %   Platelets 304 150 - 400 K/uL   nRBC 0.0 0.0 - 0.2 %  Comprehensive metabolic panel  Result Value Ref Range   Sodium 136 135 - 145 mmol/L   Potassium 4.8 3.5 - 5.1 mmol/L   Chloride 97 (L) 98 - 111 mmol/L   CO2 26 22 - 32 mmol/L   Glucose, Bld 325 (H) 70 - 99 mg/dL   BUN 12 8 - 23 mg/dL   Creatinine, Ser 0.33 (L) 0.61 - 1.24 mg/dL   Calcium 9.0 8.9 - 10.3 mg/dL   Total Protein 7.2 6.5 - 8.1 g/dL   Albumin 3.9 3.5 - 5.0 g/dL   AST 35 15 - 41 U/L   ALT 32 0 - 44 U/L   Alkaline Phosphatase 99 38 - 126 U/L   Total Bilirubin 0.6 0.3 - 1.2 mg/dL   GFR calc non Af Amer >60 >60 mL/min   GFR calc Af Amer >60 >60 mL/min   Anion gap 13 5 - 15  Lactic acid, plasma  Result Value Ref Range   Lactic Acid, Venous 1.8 0.5 - 1.9 mmol/L  Troponin I (High Sensitivity)  Result Value Ref Range   Troponin I (High Sensitivity) 17 <18 ng/L   Dg Chest Port 1 View  Result Date: 07/31/2019 CLINICAL DATA:  Shortness of breath EXAM: PORTABLE CHEST 1 VIEW COMPARISON:  01/17/2012 FINDINGS: Small left pleural effusion. Mild left basilar atelectasis. No focal consolidation. No right pleural effusion. No pneumothorax. Stable cardiomediastinal silhouette. No aggressive osseous lesion. Mild osteoarthritis of bilateral glenohumeral joints. IMPRESSION: 1. Small left pleural effusion which is  new compared with 01/17/2012. Electronically Signed   By: Kathreen Devoid   On: July 31, 2019 12:32    EKG EKG Interpretation  Date/Time:  07/31/2019 11:42:37 EDT Ventricular Rate:  89 PR Interval:    QRS Duration: 150 QT Interval:  395 QTC Calculation: 481 R Axis:   -49 Text Interpretation:  Sinus rhythm Borderline prolonged PR interval Left bundle branch block Confirmed by Lajean Saver [phone]) on Jul 31, 2019 11:49:24 AM   Radiology Dg Chest Port 1 View  Result Date: 07-31-2019 CLINICAL DATA:  Shortness of breath EXAM: PORTABLE CHEST 1 VIEW COMPARISON:  01/17/2012 FINDINGS: Small left pleural effusion. Mild left basilar atelectasis. No focal consolidation. No right pleural effusion. No pneumothorax. Stable cardiomediastinal silhouette. No aggressive osseous lesion. Mild osteoarthritis of bilateral glenohumeral joints. IMPRESSION: 1. Small left pleural effusion which is new compared with 01/17/2012. Electronically Signed   By: Kathreen Devoid   On: Jul 31, 2019 12:32    Procedures Procedures (including critical care time)  Medications Ordered in ED Medications  cefTRIAXone (ROCEPHIN) 2 g in sodium chloride 0.9 % 100 mL IVPB (2 g Intravenous New Bag/Given 07-31-2019 1313)  azithromycin (  ZITHROMAX) 500 mg in sodium chloride 0.9 % 250 mL IVPB (500 mg Intravenous New Bag/Given 07/08/19 1317)  sodium chloride 0.9 % bolus 500 mL (0 mLs Intravenous Stopped 2019-07-08 1307)  0.9 %  sodium chloride infusion ( Intravenous Stopped 2019/07/08 1307)  sodium chloride 0.9 % bolus 1,000 mL (1,000 mLs Intravenous New Bag/Given 08-Jul-2019 1308)     Initial Impression / Assessment and Plan / ED Course  I have reviewed the triage vital signs and the nursing notes.  Pertinent labs & imaging results that were available during my care of the patient were reviewed by me and considered in my medical decision making (see chart for details).  Iv ns. Continuous pulse ox and monitor. Pt profoundly hypoxic on room air w  very poor resp effort. bipap ordered.   Stat labs sent.   Reviewed nursing notes and prior charts for additional history.   Grandson present - he indicates pt previously expresses wishes as to no ventilator/intubation. Will try to contact aunt who is identified as POA.   Resp therapy consulted - discussed bipap therapy.  Blood pressure is low - cultures added. Iv ns boluses.  Lactate pending. Will cover with iv abx.    CRITICAL CARE RE: acute respiratory failure with hypoxia and hypercarbia, r/o severe sepsis, ALS.  Performed by: Mirna Mires Total critical care time: 95 minutes Critical care time was exclusive of separately billable procedures and treating other patients. Critical care was necessary to treat or prevent imminent or life-threatening deterioration. Critical care was time spent personally by me on the following activities: development of treatment plan with patient and/or surrogate as well as nursing, discussions with consultants, evaluation of patient's response to treatment, examination of patient, obtaining history from patient or surrogate, ordering and performing treatments and interventions, ordering and review of laboratory studies, ordering and review of radiographic studies, pulse oximetry and re-evaluation of patient's condition.  Palliative care team consulted.   Discussed pt with Aunt/POA - she indicates as well that patients wishes were not to be on ventilator, no intubation/cpr. I discussed that pt currently appears calm, and not in apparent discomfort - she indicates primary concern is to keep patient comfortable/medical therapy/meds abx/ivf ok.   Labs reviewed by me - wbc elev. Lactate 1.9.   CXR reviewed by me - small left effusion.  Hospitalist consulted for admission.   Pastoral care w family.  Patient expired in ED peacefully at 1450, family at bedside as pt died.   Primary care doctor called to inform of pts death.     Final Clinical  Impressions(s) / ED Diagnoses   Final diagnoses:  None    ED Discharge Orders    None           Lajean Saver, MD 08-Jul-2019 [phone]

## 2019-07-16 NOTE — ED Notes (Signed)
Dr. Ashok Cordia aware of bp in 32's.

## 2019-07-16 DEATH — deceased

## 2019-07-31 ENCOUNTER — Ambulatory Visit: Payer: Medicare Other | Admitting: Family Medicine

## 2019-08-27 ENCOUNTER — Ambulatory Visit: Payer: Medicare Other | Admitting: Podiatry
